# Patient Record
Sex: Male | Born: 1940 | Race: White | Hispanic: No | Marital: Married | Smoking: Former smoker
Health system: Southern US, Community
[De-identification: ages and names within clinical notes are randomized; demographics above are authoritative.]

## PROBLEM LIST (undated history)

## (undated) DIAGNOSIS — M359 Systemic involvement of connective tissue, unspecified: Secondary | ICD-10-CM

## (undated) DIAGNOSIS — K219 Gastro-esophageal reflux disease without esophagitis: Secondary | ICD-10-CM

## (undated) DIAGNOSIS — E2740 Unspecified adrenocortical insufficiency: Secondary | ICD-10-CM

## (undated) DIAGNOSIS — E039 Hypothyroidism, unspecified: Secondary | ICD-10-CM

## (undated) DIAGNOSIS — M4802 Spinal stenosis, cervical region: Secondary | ICD-10-CM

## (undated) DIAGNOSIS — M1990 Unspecified osteoarthritis, unspecified site: Secondary | ICD-10-CM

## (undated) DIAGNOSIS — W19XXXA Unspecified fall, initial encounter: Secondary | ICD-10-CM

## (undated) DIAGNOSIS — J449 Chronic obstructive pulmonary disease, unspecified: Secondary | ICD-10-CM

## (undated) DIAGNOSIS — C801 Malignant (primary) neoplasm, unspecified: Secondary | ICD-10-CM

## (undated) DIAGNOSIS — I10 Essential (primary) hypertension: Secondary | ICD-10-CM

## (undated) DIAGNOSIS — M069 Rheumatoid arthritis, unspecified: Secondary | ICD-10-CM

## (undated) DIAGNOSIS — I639 Cerebral infarction, unspecified: Secondary | ICD-10-CM

## (undated) DIAGNOSIS — E785 Hyperlipidemia, unspecified: Secondary | ICD-10-CM

## (undated) DIAGNOSIS — R296 Repeated falls: Secondary | ICD-10-CM

## (undated) HISTORY — PX: SKIN LESION EXCISION: SHX2412

## (undated) HISTORY — DX: Rheumatoid arthritis, unspecified: M06.9

## (undated) HISTORY — DX: Gastro-esophageal reflux disease without esophagitis: K21.9

## (undated) HISTORY — DX: Malignant (primary) neoplasm, unspecified: C80.1

## (undated) HISTORY — DX: Hypothyroidism, unspecified: E03.9

## (undated) HISTORY — DX: Hyperlipidemia, unspecified: E78.5

## (undated) HISTORY — DX: Cerebral infarction, unspecified: I63.9

## (undated) HISTORY — PX: CAROTID ENDARTERECTOMY: SUR193

## (undated) HISTORY — DX: Unspecified osteoarthritis, unspecified site: M19.90

## (undated) HISTORY — DX: Essential (primary) hypertension: I10

---

## 1989-01-27 DIAGNOSIS — C801 Malignant (primary) neoplasm, unspecified: Secondary | ICD-10-CM

## 1989-01-27 HISTORY — DX: Malignant (primary) neoplasm, unspecified: C80.1

## 1999-09-23 ENCOUNTER — Encounter: Payer: Self-pay | Admitting: Neurosurgery

## 1999-09-23 ENCOUNTER — Ambulatory Visit (HOSPITAL_COMMUNITY): Admission: RE | Admit: 1999-09-23 | Discharge: 1999-09-23 | Payer: Self-pay | Admitting: Neurosurgery

## 2003-10-28 ENCOUNTER — Ambulatory Visit: Payer: Self-pay | Admitting: Internal Medicine

## 2003-11-28 ENCOUNTER — Ambulatory Visit: Payer: Self-pay | Admitting: Internal Medicine

## 2004-01-12 ENCOUNTER — Ambulatory Visit: Payer: Self-pay | Admitting: Internal Medicine

## 2004-01-28 ENCOUNTER — Ambulatory Visit: Payer: Self-pay | Admitting: Internal Medicine

## 2004-03-05 ENCOUNTER — Ambulatory Visit: Payer: Self-pay | Admitting: Internal Medicine

## 2004-03-27 ENCOUNTER — Ambulatory Visit: Payer: Self-pay | Admitting: Internal Medicine

## 2004-04-29 ENCOUNTER — Ambulatory Visit: Payer: Self-pay | Admitting: Chiropractor

## 2004-05-07 ENCOUNTER — Ambulatory Visit: Payer: Self-pay | Admitting: Internal Medicine

## 2004-05-27 ENCOUNTER — Ambulatory Visit: Payer: Self-pay | Admitting: Internal Medicine

## 2004-07-03 ENCOUNTER — Ambulatory Visit: Payer: Self-pay | Admitting: Internal Medicine

## 2004-07-27 ENCOUNTER — Ambulatory Visit: Payer: Self-pay | Admitting: Internal Medicine

## 2004-09-03 ENCOUNTER — Ambulatory Visit: Payer: Self-pay | Admitting: Internal Medicine

## 2004-09-27 ENCOUNTER — Ambulatory Visit: Payer: Self-pay | Admitting: Internal Medicine

## 2004-10-27 ENCOUNTER — Ambulatory Visit: Payer: Self-pay | Admitting: Internal Medicine

## 2004-12-03 ENCOUNTER — Ambulatory Visit: Payer: Self-pay | Admitting: Internal Medicine

## 2004-12-27 ENCOUNTER — Ambulatory Visit: Payer: Self-pay | Admitting: Internal Medicine

## 2005-01-27 ENCOUNTER — Ambulatory Visit: Payer: Self-pay | Admitting: Internal Medicine

## 2005-01-31 ENCOUNTER — Ambulatory Visit: Payer: Self-pay | Admitting: Otolaryngology

## 2005-02-27 ENCOUNTER — Ambulatory Visit: Payer: Self-pay | Admitting: Internal Medicine

## 2005-04-01 ENCOUNTER — Ambulatory Visit: Payer: Self-pay | Admitting: Internal Medicine

## 2005-04-27 ENCOUNTER — Ambulatory Visit: Payer: Self-pay | Admitting: Internal Medicine

## 2005-05-27 ENCOUNTER — Ambulatory Visit: Payer: Self-pay | Admitting: Internal Medicine

## 2005-06-27 ENCOUNTER — Ambulatory Visit: Payer: Self-pay | Admitting: Internal Medicine

## 2005-08-26 ENCOUNTER — Ambulatory Visit: Payer: Self-pay | Admitting: Internal Medicine

## 2005-10-14 ENCOUNTER — Ambulatory Visit: Payer: Self-pay | Admitting: Internal Medicine

## 2005-10-27 ENCOUNTER — Ambulatory Visit: Payer: Self-pay | Admitting: Internal Medicine

## 2005-11-27 ENCOUNTER — Ambulatory Visit: Payer: Self-pay | Admitting: Internal Medicine

## 2005-12-27 ENCOUNTER — Ambulatory Visit: Payer: Self-pay | Admitting: Internal Medicine

## 2006-02-11 ENCOUNTER — Ambulatory Visit: Payer: Self-pay | Admitting: Internal Medicine

## 2006-02-27 ENCOUNTER — Ambulatory Visit: Payer: Self-pay | Admitting: Internal Medicine

## 2006-04-20 ENCOUNTER — Ambulatory Visit: Payer: Self-pay | Admitting: Internal Medicine

## 2006-04-28 ENCOUNTER — Ambulatory Visit: Payer: Self-pay | Admitting: Internal Medicine

## 2006-05-28 ENCOUNTER — Ambulatory Visit: Payer: Self-pay | Admitting: Internal Medicine

## 2006-06-28 ENCOUNTER — Ambulatory Visit: Payer: Self-pay | Admitting: Internal Medicine

## 2006-07-28 ENCOUNTER — Ambulatory Visit: Payer: Self-pay | Admitting: Internal Medicine

## 2006-09-11 ENCOUNTER — Ambulatory Visit: Payer: Self-pay | Admitting: Internal Medicine

## 2006-09-28 ENCOUNTER — Ambulatory Visit: Payer: Self-pay | Admitting: Internal Medicine

## 2006-10-28 ENCOUNTER — Ambulatory Visit: Payer: Self-pay | Admitting: Internal Medicine

## 2006-11-19 ENCOUNTER — Ambulatory Visit: Payer: Self-pay | Admitting: Internal Medicine

## 2006-11-28 ENCOUNTER — Ambulatory Visit: Payer: Self-pay | Admitting: Internal Medicine

## 2006-12-28 ENCOUNTER — Ambulatory Visit: Payer: Self-pay | Admitting: Internal Medicine

## 2007-01-28 ENCOUNTER — Ambulatory Visit: Payer: Self-pay | Admitting: Internal Medicine

## 2007-02-28 ENCOUNTER — Ambulatory Visit: Payer: Self-pay | Admitting: Internal Medicine

## 2007-04-08 ENCOUNTER — Ambulatory Visit: Payer: Self-pay | Admitting: Oncology

## 2007-04-28 ENCOUNTER — Ambulatory Visit: Payer: Self-pay | Admitting: Oncology

## 2007-05-28 ENCOUNTER — Ambulatory Visit: Payer: Self-pay | Admitting: Oncology

## 2007-06-08 ENCOUNTER — Ambulatory Visit: Payer: Self-pay | Admitting: Oncology

## 2007-06-16 ENCOUNTER — Ambulatory Visit: Payer: Self-pay | Admitting: Internal Medicine

## 2007-06-28 ENCOUNTER — Ambulatory Visit: Payer: Self-pay | Admitting: Oncology

## 2007-06-28 ENCOUNTER — Ambulatory Visit: Payer: Self-pay | Admitting: Unknown Physician Specialty

## 2007-07-28 ENCOUNTER — Ambulatory Visit: Payer: Self-pay | Admitting: Oncology

## 2007-08-28 ENCOUNTER — Ambulatory Visit: Payer: Self-pay | Admitting: Oncology

## 2007-09-28 ENCOUNTER — Ambulatory Visit: Payer: Self-pay | Admitting: Oncology

## 2007-10-12 ENCOUNTER — Ambulatory Visit: Payer: Self-pay | Admitting: Oncology

## 2007-10-28 ENCOUNTER — Ambulatory Visit: Payer: Self-pay | Admitting: Oncology

## 2007-12-28 ENCOUNTER — Ambulatory Visit: Payer: Self-pay | Admitting: Oncology

## 2008-01-14 ENCOUNTER — Ambulatory Visit: Payer: Self-pay | Admitting: Oncology

## 2008-01-28 ENCOUNTER — Ambulatory Visit: Payer: Self-pay | Admitting: Oncology

## 2008-04-27 ENCOUNTER — Ambulatory Visit: Payer: Self-pay | Admitting: Oncology

## 2008-05-05 ENCOUNTER — Ambulatory Visit: Payer: Self-pay | Admitting: Oncology

## 2008-05-27 ENCOUNTER — Ambulatory Visit: Payer: Self-pay | Admitting: Oncology

## 2008-07-27 ENCOUNTER — Ambulatory Visit: Payer: Self-pay | Admitting: Oncology

## 2008-08-04 ENCOUNTER — Ambulatory Visit: Payer: Self-pay | Admitting: Oncology

## 2008-08-27 ENCOUNTER — Ambulatory Visit: Payer: Self-pay | Admitting: Oncology

## 2008-10-27 ENCOUNTER — Ambulatory Visit: Payer: Self-pay | Admitting: Oncology

## 2008-11-03 ENCOUNTER — Ambulatory Visit: Payer: Self-pay | Admitting: Oncology

## 2008-11-27 ENCOUNTER — Ambulatory Visit: Payer: Self-pay | Admitting: Oncology

## 2008-12-27 ENCOUNTER — Ambulatory Visit: Payer: Self-pay | Admitting: Oncology

## 2009-01-27 ENCOUNTER — Ambulatory Visit: Payer: Self-pay | Admitting: Oncology

## 2009-02-27 ENCOUNTER — Ambulatory Visit: Payer: Self-pay | Admitting: Oncology

## 2009-03-16 ENCOUNTER — Ambulatory Visit: Payer: Self-pay | Admitting: Oncology

## 2009-03-25 ENCOUNTER — Ambulatory Visit: Payer: Self-pay | Admitting: Otolaryngology

## 2009-03-27 ENCOUNTER — Ambulatory Visit: Payer: Self-pay | Admitting: Oncology

## 2009-04-19 ENCOUNTER — Ambulatory Visit: Payer: Self-pay | Admitting: Family

## 2009-04-27 ENCOUNTER — Ambulatory Visit: Payer: Self-pay | Admitting: Oncology

## 2009-05-05 ENCOUNTER — Emergency Department: Payer: Self-pay | Admitting: Emergency Medicine

## 2009-05-11 ENCOUNTER — Encounter: Payer: Self-pay | Admitting: Otolaryngology

## 2009-05-27 ENCOUNTER — Ambulatory Visit: Payer: Self-pay | Admitting: Oncology

## 2009-06-08 ENCOUNTER — Ambulatory Visit: Payer: Self-pay | Admitting: Cardiovascular Disease

## 2009-06-08 DIAGNOSIS — R42 Dizziness and giddiness: Secondary | ICD-10-CM

## 2009-06-08 DIAGNOSIS — E785 Hyperlipidemia, unspecified: Secondary | ICD-10-CM

## 2009-06-10 DIAGNOSIS — I6529 Occlusion and stenosis of unspecified carotid artery: Secondary | ICD-10-CM

## 2009-06-12 ENCOUNTER — Encounter: Payer: Self-pay | Admitting: Cardiovascular Disease

## 2009-06-19 ENCOUNTER — Telehealth: Payer: Self-pay | Admitting: Cardiovascular Disease

## 2009-06-27 ENCOUNTER — Ambulatory Visit: Payer: Self-pay | Admitting: Oncology

## 2009-07-04 ENCOUNTER — Encounter: Admission: RE | Admit: 2009-07-04 | Discharge: 2009-07-04 | Payer: Self-pay | Admitting: Diagnostic Neuroimaging

## 2009-07-06 ENCOUNTER — Telehealth: Payer: Self-pay | Admitting: Cardiovascular Disease

## 2009-07-27 ENCOUNTER — Ambulatory Visit: Payer: Self-pay | Admitting: Internal Medicine

## 2009-08-08 ENCOUNTER — Encounter: Payer: Self-pay | Admitting: Cardiovascular Disease

## 2009-08-27 ENCOUNTER — Ambulatory Visit: Payer: Self-pay | Admitting: Oncology

## 2009-08-30 ENCOUNTER — Ambulatory Visit: Payer: Self-pay | Admitting: Cardiovascular Disease

## 2009-08-30 DIAGNOSIS — I739 Peripheral vascular disease, unspecified: Secondary | ICD-10-CM | POA: Insufficient documentation

## 2009-08-30 DIAGNOSIS — I10 Essential (primary) hypertension: Secondary | ICD-10-CM | POA: Insufficient documentation

## 2009-09-14 ENCOUNTER — Ambulatory Visit: Payer: Self-pay | Admitting: Oncology

## 2009-09-27 ENCOUNTER — Ambulatory Visit: Payer: Self-pay | Admitting: Oncology

## 2009-09-27 ENCOUNTER — Encounter: Payer: Self-pay | Admitting: Cardiovascular Disease

## 2009-09-27 ENCOUNTER — Ambulatory Visit: Payer: Self-pay

## 2009-10-03 ENCOUNTER — Ambulatory Visit: Payer: Self-pay | Admitting: Cardiovascular Disease

## 2009-10-10 ENCOUNTER — Telehealth: Payer: Self-pay | Admitting: Cardiovascular Disease

## 2009-10-15 ENCOUNTER — Ambulatory Visit: Payer: Self-pay | Admitting: Internal Medicine

## 2009-12-14 ENCOUNTER — Ambulatory Visit: Payer: Self-pay | Admitting: Oncology

## 2009-12-24 ENCOUNTER — Ambulatory Visit: Payer: Self-pay | Admitting: Internal Medicine

## 2009-12-27 ENCOUNTER — Ambulatory Visit: Payer: Self-pay | Admitting: Oncology

## 2010-02-17 ENCOUNTER — Encounter: Payer: Self-pay | Admitting: Diagnostic Neuroimaging

## 2010-02-21 ENCOUNTER — Telehealth: Payer: Self-pay | Admitting: Cardiovascular Disease

## 2010-02-26 NOTE — Letter (Signed)
Summary: PHI  PHI   Imported By: Harlon Flor 06/11/2009 16:47:19  _____________________________________________________________________  External Attachment:    Type:   Image     Comment:   External Document

## 2010-02-26 NOTE — Op Note (Signed)
Summary: Operative Report  Operative Report   Imported By: [place] Carbo 08/31/2009 11:17:57  _____________________________________________________________________  External Attachment:    Type:   Image     Comment:   External Document

## 2010-02-26 NOTE — Progress Notes (Signed)
Summary: FYI  Phone Note Call from Patient   Caller: Patient Call For: Gollan Summary of Call: Pt called in states he saw neurologist and they found out what is wrong with pt.  Tests ran and found pt to have narrowing of the arteries in the back of his neck.  Pt has been referred to [facility] to his former vascular surgeon Dr Alvia Grove. Pt very gracious for the care given to him at our office.   Initial call taken by: Cloyde Reams RN,  July 06, 2009 11:57 AM

## 2010-02-26 NOTE — Miscellaneous (Signed)
Summary: Orders Update  Clinical Lists Changes  Orders: Added new Referral order of Holter Monitor (Holter Monitor) - Signed

## 2010-02-26 NOTE — Assessment & Plan Note (Signed)
Summary: ROV   Visit Type:  Follow-up Primary Provider:  Ronna Polio, MD  CC:  c/o problems with BP and dizziness.Marland Kitchen  History of Present Illness: 70 year old Sanford, patient of Dr. Ronna Polio, with history of GERD, CVA, carotid arterial disease with a carotid endarterectomy on the left in 2003 with 60% disease on the right, hypothyroidism who presents for follow up evaluation of dizziness and labile blood pressures.  he reports that he is dizzy episodes are improved though his blood pressure continues to be labile. One minute it is elevated, the next it is very low with systolics in the 90s or low 100s, and then later it will be well controlled with pressures in the 130s to 140s systolic. He is unable to identify what makes it swing wildly. It is not a change in position, not a medication that he is taking. He denies any significant pain or stress that might push it upwards. He is well hydrated at all times.  He had orthostasis on his last office visit and his amlodipine was discontinued. Attempt to restart the amlodipine caused hypotension again and he stopped the medication again. He can tell when his blood pressure gets too low as he does not feel well. He does check his blood pressure quite frequently at home. he reports that the urine collection for pheochromocytoma was negative.   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 He also has had exposure to toxic chemicals. He does have a problem with dizziness and orthostasis he stands still though he has never had a tilt table test.  Preventive Screening-Counseling & Management  Alcohol-Tobacco     Smoking Status: never  Caffeine-Diet-Exercise     Does Patient Exercise: yes      Drug Use:  no.    Current Medications (verified): 1)  Simvastatin 20 Mg Tabs (Simvastatin) .Marland Kitchen.. 1 Tablet Once Daily 2)  Calcium + Vitamin D .... Take Once Daily 3)  Omega-3 1000 Mg Caps (Omega-3 Fatty Acids)  .... Take 1 By Mouth Once Daily 4)  Meclizine Hcl 25 Mg Tabs (Meclizine Hcl) .... Take 1 By Mouth As Needed 5)  Alprazolam 0.25 Mg Tabs (Alprazolam) .... Take 1 By Mouth As Needed 6)  Aspirin 81 Mg Tbec (Aspirin) .... Take Two Tablets By Mouth Daily 7)  Levoxyl 112 Mcg Tabs (Levothyroxine Sodium) .Marland Kitchen.. 1 Tablet Once Daily 8)  Hydroxyurea 500 Mg Caps (Hydroxyurea) .... Take As Directed 9)  Prilosec 20 Mg Cpdr (Omeprazole) .Marland Kitchen.. 1 Tablet Once Daily  Allergies (verified): No Known Drug Allergies  Past History:  Family History: Last updated: 08/30/2009 Family History of Coronary Artery Disease: Father MI age 53  Social History: Last updated: 08/30/2009 Retired  Married  Tobacco Use - No.  Alcohol Use - no Regular Exercise - yes Drug Use - no  Risk Factors: Exercise: yes (08/30/2009)  Risk Factors: Smoking Status: never (08/30/2009)  Past Medical History: Hyperlipidemia Hypertension CAD CVA GERD hypothyroidism  Past Surgical History: Carotid Endarterectomy  Family History: Family History of Coronary Artery Disease: Father MI age 29  Social History: Retired  Married  Tobacco Use - No.  Alcohol Use - no Regular Exercise - yes Drug Use - no Smoking Status:  never Does Patient Exercise:  yes Drug Use:  no  Review of Systems  The patient denies fever, weight loss, weight gain, vision loss, decreased hearing, hoarseness, chest pain, syncope, dyspnea on exertion, peripheral edema, prolonged cough, abdominal pain, incontinence, muscle weakness,  depression, and enlarged lymph nodes.         labile blood presure, dizzy with low BP  Vital Signs:  Patient profile:   70 year old male Height:      66 inches Weight:      204 pounds BMI:     33.05 Pulse rate:   83 / minute BP sitting:   138 / 94  (left arm)  Vitals Entered By: Bishop Dublin, CMA (August 30, 2009 4:10 PM)  Nutrition Counseling: Patient's BMI is greater than 25 and therefore counseled on weight  management options.  Physical Exam  General:  Well developed, well nourished, in no acute distress. Head:  normocephalic and atraumatic Neck:  Neck supple, no JVD. No masses, thyromegaly or abnormal cervical nodes. Lungs:  Moderately decresaed BS throughout Heart:  Non-displaced PMI, chest non-tender; regular rate and rhythm, S1, S2 with II/VI SEM RSB,  no rubs or gallops. Carotid upstroke normal, no bruit. Normal abdominal aortic size, no bruits. Femorals normal pulses, no bruits. Pedals normal pulses. No edema, no varicosities. Abdomen:  Bowel sounds positive; abdomen soft and non-tender without masses Msk:  Back normal, normal gait. Muscle strength and tone normal. Pulses:  pulses normal in all 4 extremities Extremities:  No clubbing or cyanosis. Neurologic:  Alert and oriented x 3. Skin:  Intact without lesions or rashes. Psych:  Normal affect.   Impression & Recommendations:  Problem # 1:  HYPERTENSION, LABILE (ICD-401.9) etiology of his labile blood pressure is still uncertain. We have ordered a renal artery ultrasound. He has had an MRI, catheterization to evaluate the carotid disease and vessels in his head and neck, urine test. His symptoms do not seem to indicate an underlying cardiac etiology. His Holter test was essentially negative over 48 hours. I suspect that he may have either renal artery stenosis or some underlying neurocardiogenic etiology to his labile pressure. I have suggested that he could use salt loading for his low pressure, TED hose if he feels symptomatic. For his high pressure, he could take hydralazine 25 mg p.o. p.r.n. with a repeat dose if necessary for hypertension with systolic pressure greater than 170.  It is difficult to get a sense of how symptomatic he is. He is not symptomatic with his elevated pressures though does check his blood pressure quite frequently. It does sound that he has some symptoms of dizziness when he is hypotensive and I have  suggested that he lie on his bed rather than sit in his recliner and expect his symptoms to resolve.  His updated medication list for this problem includes:    Aspirin 81 Mg Tbec (Aspirin) .Marland Kitchen... Take two tablets by mouth daily  Orders: Renal Artery Duplex (Renal Artery Duplex)  Problem # 2:  HYPERLIPIDEMIA-MIXED (ICD-272.4) Given his underlying carotid arterial disease, we have recommended to him that he have a total cholesterol less than 150, LDL less than 70  His updated medication list for this problem includes:    Simvastatin 20 Mg Tabs (Simvastatin) .Marland Kitchen... 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  His updated medication list for this problem includes:    Aspirin 81 Mg Tbec (Aspirin) .Marland Kitchen... Take two tablets by mouth daily  Patient Instructions: 1)  Your physician recommends that you continue on your current medications as directed. Please refer to the Current Medication list given to you  today. 2)  Your physician wants you to follow-up in:   6 months You will receive a reminder letter in the mail two months in advance. If you don't receive a letter, please call our office to schedule the follow-up appointment. 3)  Your physician has requested that you have a renal artery duplex. During this test, an ultrasound is used to evaluate blood flow to the kidneys. Allow one hour for this exam. Do not eat after midnight the day before and avoid carbonated beverages. Take your medications as you usually do.

## 2010-02-26 NOTE — Progress Notes (Signed)
Summary: RESULTS  Phone Note Call from Patient Call back at Home Phone [phone]   Caller: SELF Call For: [facility] Summary of Call: WOULD LIKE HOLTER RESULTS Initial call taken by: Harlon Flor,  Jun 19, 2009 2:30 PM  Follow-up for Phone Call        Called spoke with pt's wife advised Holter monitor results are back awaiting Dr Windell Hummingbird review.  Will call after results are interpreted by Dr Mariah Milling.   Follow-up by: Cloyde Reams RN,  Jun 19, 2009 3:09 PM    Additional Follow-up for Phone Call Additional follow up Details #2::    Looks normal   Appended Document: RESULTS Spoke with pt aware of results.  EWJ

## 2010-02-26 NOTE — Progress Notes (Signed)
Summary: CALL BACK  Phone Note Call from Patient Call back at Home Phone [phone]   Caller: SELF Call For: [facility] Summary of Call: WOULD LIKE A CALL BACK ABOUT SCHEDULING THE CT SCAN Initial call taken by: Harlon Flor,  October 10, 2009 11:39 AM  Follow-up for Phone Call        Dr. Tilman Neat office has never gotten back with pt about scheduling CT scan. Can we order the CT for this pt. Please advise. Benedict Needy, RN  October 10, 2009 4:10 PM   Additional Follow-up for Phone Call Additional follow up Details #1::        we can order the CT scan. Would let Dr. walkers office know that we are going to do it so they know...hope it is ok with them

## 2010-02-26 NOTE — Assessment & Plan Note (Signed)
Summary: NP6/AMD   Primary Provider:  Ronna Polio, MD  CC:  Consult; Dizziness.  History of Present Illness: 70 year old gentleman, patient of Dr. Ronna Polio, with history of GERD, CVA, carotid arterial disease with a carotid endarterectomy on the left in 2003 with 75% disease on the right, hypothyroidism who presents for workup of dizziness.  [name] states that he has frequent episodes of dizziness starting back in January. They typically last from one to 3 minutes. They occur when he gets up from a chair, when he is over heated and working outside in his yard. He states his symptoms have been a little but better when he has been drinking Gatorade. Once he has been standing, he typically does not have further episodes of dizziness.  Blood pressures in the office show some orthostasis with blood pressure of 112/74, pulse of 66 lying down, blood pressure decreasing to 99/66 with pulse of 80 with standing.  Current Medications (verified): 1)  Simvastatin (Unknown Dose) Tabs (Simvastatin) .... Take One Tablet By Mouth Daily At Bedtime 2)  Amlodipine Besylate 2.5 Mg Tabs (Amlodipine Besylate) .... Take 1 By Mouth Once Daily 3)  Calcium + Vitamin D .... Take Once Daily 4)  Omega-3 1000 Mg Caps (Omega-3 Fatty Acids) .... Take 1 By Mouth Once Daily 5)  Meclizine Hcl 25 Mg Tabs (Meclizine Hcl) .... Take 1 By Mouth As Needed 6)  Alprazolam 0.25 Mg Tabs (Alprazolam) .... Take 1 By Mouth As Needed 7)  Aspirin 81 Mg Tbec (Aspirin) .... Take One Tablet By Mouth Daily 8)  Levoxyl 100 Mcg Tabs (Levothyroxine Sodium) .... Take 1 By Mouth Once Daily 9)  Hydroxyurea 500 Mg Caps (Hydroxyurea) .... Take As Directed  Allergies (verified): No Known Drug Allergies  Review of Systems  The patient denies fever, weight loss, weight gain, vision loss, decreased hearing, hoarseness, chest pain, syncope, dyspnea on exertion, peripheral edema, prolonged cough, abdominal pain, incontinence, muscle  weakness, depression, and enlarged lymph nodes.    Vital Signs:  Patient profile:   70 year old male Height:      66 inches Weight:      200.75 pounds BMI:     32.52 Pulse rate:   69 / minute BP sitting:   94 / 62  (left arm) Cuff size:   regular  Vitals Entered By: Stanton Kidney, EMT-P (Jun 08, 2009 3:21 PM)  Serial Vital Signs/Assessments:  Time      Position  BP       Pulse  Resp  Temp     By 3:39 PM             112/74   66                    Stanton Kidney, EMT-P 3:41 PM             101/68   76                    Stanton Kidney, EMT-P 3:42 PM             105/68   83                    Stanton Kidney, EMT-P 3:44 PM             100/66   82                    Stanton Kidney, EMT-P 3:47  99/66    80                    Stanton Kidney, EMT-P  Comments: 3:39 PM Laying (L) arm -no symptoms By: Stanton Kidney, EMT-P  3:41 PM Sitting (L) arm- no symptoms By: Stanton Kidney, EMT-P  3:42 PM 0 min Standing (L) arm By: Stanton Kidney, EMT-P  3:44 PM 2 min Standing (L) arm- no symptoms By: Stanton Kidney, EMT-P  3:47 5 min Standing (L) arm- No symptoms By: Stanton Kidney, EMT-P    Physical Exam  General:  well-appearing middle-aged gentleman in no apparent distress, HEENT exam is benign, oropharynx is clear, neck supple with no JVP, 2+ carotid bruit on the right, heart sounds are regular with S1-S2 and no murmurs appreciated, lungs are clear to auscultation with no wheezes Rales, abdominal exam is benign, no significant lower extremity edema, neurologic exam is grossly nonfocal, skin is warm and dry. Pulses are equal and symmetrical in his upper and lower extremities.    EKG  Procedure date:  06/08/2009  Findings:      normal sinus rhythm with a rate of 69 beats per minute, no significant ST or T wave changes  Impression & Recommendations:  Problem # 1:  DIZZINESS (ICD-780.4) etiology of his dizziness is uncertain. He does have some orthostasis in the office and his symptoms do sound  somewhat orthostatic in nature. We have suggested that he hold his amlodipine and monitor his blood pressure at home in his symptoms.  We have ordered an event monitor for him to wear through the weekend and he will turn this and as soon as time is complete. We will call him with the results.  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 His lipid control will be very important for him. His goal LDL is less than 70. He is on simvastatin but he does not know the dose. I've encouraged him to increase his aspirin to 81 mg x2  His updated medication list for this problem includes:    Aspirin 81 Mg Tbec (Aspirin) .Marland Kitchen... Take two tablets by mouth daily  Patient Instructions: 1)  Your physician has recommended you make the following change in your medication: Start taking 81mg  Aspirin 2 tablets daily and hold your Amlodipine.  2)  Your physician has recommended that you wear a holter monitor.  Holter monitors are medical devices that record the heart's electrical activity. Doctors most often use these monitors to diagnose arrhythmias. Arrhythmias are problems with the speed or rhythm of the heartbeat. The monitor is a small, portable device. You can wear one while you do your normal daily activities. This is usually used to diagnose what is causing palpitations/syncope (passing out).

## 2010-02-26 NOTE — Letter (Signed)
Summary: [facility]   Imported By: Harlon Flor 06/11/2009 07:43:51  _____________________________________________________________________  External Attachment:    Type:   Image     Comment:   External Document

## 2010-02-26 NOTE — Assessment & Plan Note (Signed)
Summary: ROV/AMD   Visit Type:  Follow-up Primary Provider:  Ronna Polio, MD  CC:  c/o blood pressure problems.  Denies chest pain or shortness of breath.  .  History of Present Illness: 70 year old gentleman, patient of Dr. Ronna Polio, with history of GERD, CVA, carotid arterial disease with a carotid endarterectomy on the left in 2003 with 60% disease on the right, hypothyroidism who presents for follow up. in the past, he has had dizziness and symptoms of feeling like he is going to pass out on his blood pressure medication. He has very labile blood pressures.  He reports that he has been doing well recently. His blood pressure is still labile though he is not taking any blood pressure medications. On average, he has systolic pressures in the 130s. Rarely it goes to 160. He does not have any dizziness or symptoms like he feels he is going to pass out.  He is relatively active. Otherwise has no new complaints.  He did have a recent renal ultrasound that showed no renal artery stenosis, kidneys are normal size and symmetrical. Of note he does have some masses ranging in size on the right side. CT or MRI was recommended. He has no symptoms of discomfort on the side.  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 He also has had exposure to toxic chemicals. He does have a remote hx of dizziness and orthostasis if he stands still though he has never had a tilt table test.  Current Medications (verified): 1)  Simvastatin 20 Mg Tabs (Simvastatin) .Marland Kitchen.. 1 Tablet Once Daily 2)  Calcium + Vitamin D .... Take Once Daily 3)  Omega-3 1000 Mg Caps (Omega-3 Fatty Acids) .... Take 1 By Mouth Once Daily 4)  Alprazolam 0.25 Mg Tabs (Alprazolam) .... Take 1 By Mouth As Needed 5)  Aspirin 81 Mg Tbec (Aspirin) .... Take Two Tablets By Mouth Daily 6)  Levoxyl 112 Mcg Tabs (Levothyroxine Sodium) .Marland Kitchen.. 1 Tablet Once Daily 7)  Hydroxyurea 500 Mg Caps (Hydroxyurea)  .... Take As Directed 8)  Prilosec 20 Mg Cpdr (Omeprazole) .Marland Kitchen.. 1 Tablet Once Daily  Allergies (verified): No Known Drug Allergies  Past History:  Past Medical History: Last updated: 08/30/2009 Hyperlipidemia Hypertension CAD CVA GERD hypothyroidism  Past Surgical History: Last updated: 08/30/2009 Carotid Endarterectomy  Family History: Last updated: 08/30/2009 Family History of Coronary Artery Disease: Father MI age 74  Social History: Last updated: 08/30/2009 Retired  Married  Tobacco Use - No.  Alcohol Use - no Regular Exercise - yes Drug Use - no  Risk Factors: Exercise: yes (08/30/2009)  Risk Factors: Smoking Status: never (08/30/2009)  Review of Systems  The patient denies fever, weight loss, weight gain, vision loss, decreased hearing, hoarseness, chest pain, syncope, dyspnea on exertion, peripheral edema, prolonged cough, abdominal pain, incontinence, muscle weakness, depression, and enlarged lymph nodes.    Vital Signs:  Patient profile:   70 year old male Height:      66 inches Weight:      204 pounds BMI:     33.05 Pulse rate:   84 / minute BP sitting:   128 / 92  (left arm) Cuff size:   regular  Vitals Entered By: Bishop Dublin, CMA (October 03, 2009 11:49 AM)  Physical Exam  General:  Well developed, well nourished, in no acute distress. Head:  normocephalic and atraumatic Neck:  Neck supple, no JVD. No masses, thyromegaly or abnormal cervical nodes. Lungs:  Moderately decresaed BS throughout Heart:  Non-displaced PMI, chest non-tender; regular rate and rhythm, S1, S2 with II/VI SEM RSB,  no rubs or gallops. Carotid upstroke normal, no bruit. Normal abdominal aortic size, no bruits. Femorals normal pulses, no bruits. Pedals normal pulses. Trace edema, no varicosities. Abdomen:   abdomen soft and non-tender without masses Msk:  Back normal, normal gait. Muscle strength and tone normal. Pulses:  pulses normal in all 4  extremities Extremities:  No clubbing or cyanosis. Neurologic:  Alert and oriented x 3. Skin:  Intact without lesions or rashes. Psych:  Normal affect.   Impression & Recommendations:  Problem # 1:  HYPERTENSION, LABILE (ICD-401.9) we have recommended that he not take any blood pressure medications at this time on a regular basis. He can take hydralazine on a p.r.n. basis for systolic pressures greater than 170. He has very labile blood pressures of uncertain etiology. His workup has been extensive. He has had radiation exposure to the head and neck and this may be contributing.  His updated medication list for this problem includes:    Aspirin 81 Mg Tbec (Aspirin) .Marland Kitchen... Take two tablets by mouth daily  Problem # 2:  PVD (ICD-443.9) He does have at least moderate carotid arterial disease, history of carotid endarterectomy on the left in 2003. We'll continue aggressive lipid management.  Problem # 3:  HYPERLIPIDEMIA-MIXED (ICD-272.4) We do not have a copy of his most recent lipid panel we'll try to obtain a values for our records.  His updated medication list for this problem includes:    Simvastatin 20 Mg Tabs (Simvastatin) .Marland Kitchen... 1 tablet once daily  Problem # 4:  DIZZINESS (ICD-780.4) he has had no further episodes of dizziness off all blood pressure medications. We'll continue to hold his medications as he is very sensitive.

## 2010-02-26 NOTE — Procedures (Signed)
Summary: Holter and Event  Holter and Event   Imported By: Harlon Flor 06/22/2009 11:23:19  _____________________________________________________________________  External Attachment:    Type:   Image     Comment:   External Document

## 2010-03-06 NOTE — Progress Notes (Signed)
Summary: BP Fluctuating  Phone Note Call from Patient Call back at Home Phone [phone]   Caller: Self Call For: Gollan Summary of Call: BP is fluctuating.  Either too high or too low.  Pt is taking steriods from [facility].  73/68 to 140/101.   Initial call taken by: Harlon Flor,  February 21, 2010 3:54 PM  Follow-up for Phone Call        I am not too concerned with his high BP. The low BP can be concerning. He has had labile BP before, thought secondary to previous radiation to head and neck. Not much to do. When low, needs to stay hydrated, salt load, TED hose. When high, wouldf recheck 30 min later, could take hydralazine if needed though would be very careful as this could bottom him out.      Appended Document: BP Fluctuating Spoke to pt today, he states BP is staying low most of the time, the only time it goes up is after taking a salt tablet. Notified pt we are not concerned with the elevated numbers but are concerned about the low BP's. Pt has f/u at [facility] 03/27/10. He has been consistently increasing fluid intake and wears TED Hose daily. Pt will incr salt load, and advised pt if systolic BP running <90 to take salt tablet (pt already had these prescribed). Pt ok with this and he will contact our office if after trying this as well and BP remains severely low.  Appended Document: BP Fluctuating Pt was also started on Florinef 0.1 mg 1/2 tablet daily.   Clinical Lists Changes  Medications: Added new medication of FLUDROCORTISONE ACETATE 0.1 MG TABS (FLUDROCORTISONE ACETATE) 1/2 tablet once daily. - Signed

## 2010-03-15 ENCOUNTER — Ambulatory Visit: Payer: Self-pay | Admitting: Oncology

## 2010-03-28 ENCOUNTER — Ambulatory Visit: Payer: Self-pay | Admitting: Oncology

## 2010-06-14 ENCOUNTER — Ambulatory Visit: Payer: Self-pay | Admitting: Oncology

## 2010-06-28 ENCOUNTER — Ambulatory Visit: Payer: Self-pay | Admitting: Oncology

## 2010-07-06 ENCOUNTER — Emergency Department: Payer: Self-pay | Admitting: Internal Medicine

## 2010-08-12 ENCOUNTER — Encounter: Payer: Self-pay | Admitting: Cardiovascular Disease

## 2010-09-20 ENCOUNTER — Ambulatory Visit: Payer: Self-pay | Admitting: Oncology

## 2010-09-28 ENCOUNTER — Ambulatory Visit: Payer: Self-pay | Admitting: Oncology

## 2010-10-01 ENCOUNTER — Encounter: Payer: Self-pay | Admitting: Cardiovascular Disease

## 2010-10-02 ENCOUNTER — Ambulatory Visit: Payer: Self-pay | Admitting: Cardiovascular Disease

## 2010-10-04 ENCOUNTER — Other Ambulatory Visit: Payer: Self-pay | Admitting: *Deleted

## 2010-10-04 MED ORDER — FLUTICASONE PROPIONATE 50 MCG/ACT NA SUSP: 1.0000 | Freq: Every day | NASAL | Status: DC

## 2010-10-08 ENCOUNTER — Ambulatory Visit (INDEPENDENT_AMBULATORY_CARE_PROVIDER_SITE_OTHER): Payer: Medicare Other | Admitting: Cardiovascular Disease

## 2010-10-08 ENCOUNTER — Encounter: Payer: Self-pay | Admitting: Cardiovascular Disease

## 2010-10-08 DIAGNOSIS — E785 Hyperlipidemia, unspecified: Secondary | ICD-10-CM

## 2010-10-08 DIAGNOSIS — I6529 Occlusion and stenosis of unspecified carotid artery: Secondary | ICD-10-CM

## 2010-10-08 DIAGNOSIS — I10 Essential (primary) hypertension: Secondary | ICD-10-CM

## 2010-10-08 DIAGNOSIS — I739 Peripheral vascular disease, unspecified: Secondary | ICD-10-CM

## 2010-10-08 DIAGNOSIS — R42 Dizziness and giddiness: Secondary | ICD-10-CM

## 2010-10-08 NOTE — Progress Notes (Signed)
Patient ID: Johnny Sanford, male    DOB: [birth year], 70 y.o.   MRN: [id number]  HPI Comments: 70 year old gentleman, patient of Dr. Ronna Polio, with history of GERD, CVA, carotid arterial disease with a carotid endarterectomy on the left in 2003 with 60% disease on the right, hypothyroidism, very labile blood pressures, Started on Florinef recently by a [facility] physician, who presents for routine followup   He reports that he has been doing well recently. His blood pressure is still labile though he Believes it has smooth out on the Florinef.  On average, he has systolic pressures in the 130s. Rarely it goes to 160. He does not have any dizziness or symptoms like he feels he is going to pass out. He is relatively active. Otherwise has no new complaints.   He did have a recent renal ultrasound that showed no renal artery stenosis, kidneys are normal size and symmetrical. Of note he does have some masses ranging in size on the right side.   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 He also has had exposure to toxic chemicals. He does have a remote hx of dizziness and orthostasis if he stands still though he has never had a tilt table test.  EKG shows normal sinus rhythm with rate 72 beats per minute with no significant ST or T wave changes     Outpatient Encounter Prescriptions as of 10/08/2010  Medication Sig Dispense Refill  . ALPRAZolam (XANAX) 0.25 MG tablet Take 0.25 mg by mouth daily as needed.        Marland Kitchen aspirin 81 MG EC tablet Take 162 mg by mouth daily.        . Calcium-Vitamin D 600-200 MG-UNIT per tablet Take 1 tablet by mouth daily. Unsure of dosage       . FLUDROCORTISONE ACETATE PO Take 0.5 tablets by mouth daily. 0.1 mg       . fluticasone (FLONASE) 50 MCG/ACT nasal spray Place 1 spray into the nose daily.  16 g  2  . hydroxyurea (HYDREA) 500 MG capsule Take 500 mg by mouth as directed. May take with food to minimize GI side effects.         Marland Kitchen levothyroxine (SYNTHROID, LEVOTHROID) 137 MCG tablet Take 137 mcg by mouth daily.        . OMEGA-3 1000 MG CAPS Take 1 capsule by mouth daily.        Marland Kitchen omeprazole (PRILOSEC) 20 MG capsule Take 20 mg by mouth daily.        . simvastatin (ZOCOR) 20 MG tablet Take 20 mg by mouth daily.        Marland Kitchen DISCONTD: levothyroxine (SYNTHROID, LEVOTHROID) 112 MCG tablet Take 112 mcg by mouth daily.          Review of Systems  Constitutional: Negative.   HENT: Negative.   Eyes: Negative.   Respiratory: Negative.   Cardiovascular: Negative.   Gastrointestinal: Negative.   Musculoskeletal: Negative.   Skin: Negative.   Neurological: Negative.   Hematological: Negative.   Psychiatric/Behavioral: Negative.   All other systems reviewed and are negative.    BP 135/85  Pulse 62  Ht 5\' 6"  (1.676 m)  Wt 195 lb (88.451 kg)  BMI 31.47 kg/m2  Physical Exam  Nursing note and vitals reviewed. Constitutional: He is oriented to person, place, and time. He appears well-developed and well-nourished.  HENT:  Head: Normocephalic.  Nose: Nose normal.  Mouth/Throat: Oropharynx is clear  and moist.  Eyes: Conjunctivae are normal. Pupils are equal, round, and reactive to light.  Neck: Normal range of motion. Neck supple. No JVD present.  Cardiovascular: Normal rate, regular rhythm, S1 normal, S2 normal and intact distal pulses.  Exam reveals no gallop and no friction rub.   Murmur heard.  Crescendo systolic murmur is present with a grade of 1/6  Pulmonary/Chest: Effort normal and breath sounds normal. No respiratory distress. He has no wheezes. He has no rales. He exhibits no tenderness.  Abdominal: Soft. Bowel sounds are normal. He exhibits no distension. There is no tenderness.  Musculoskeletal: Normal range of motion. He exhibits no edema and no tenderness.  Lymphadenopathy:    He has no cervical adenopathy.  Neurological: He is alert and oriented to person, place, and time. Coordination normal.  Skin:  Skin is warm and dry. No rash noted. No erythema.  Psychiatric: He has a normal mood and affect. His behavior is normal. Judgment and thought content normal.           Assessment and Plan

## 2010-10-08 NOTE — Assessment & Plan Note (Signed)
Labile blood pressures with no recent lightheadedness or dizziness. He is on Florinef 0.5 mg daily.

## 2010-10-08 NOTE — Patient Instructions (Signed)
You are doing well. No medication changes were made. Please call us if you have new issues that need to be addressed before your next appt.  We will call you for a follow up Appt. In 6 months  

## 2010-10-08 NOTE — Assessment & Plan Note (Signed)
He reports having routine carotid ultrasound at [facility], recent catheterization to evaluate his carotid stenosis estimated at 60%. We'll continue aggressive lipid management.

## 2010-10-08 NOTE — Assessment & Plan Note (Signed)
We will watch for his lipid panel when he next sees Dr. Dan Humphreys. Goal LDL should be less than 70 given his severe underlying carotid arterial disease.

## 2010-10-17 ENCOUNTER — Ambulatory Visit (INDEPENDENT_AMBULATORY_CARE_PROVIDER_SITE_OTHER): Payer: Medicare Other | Admitting: Internal Medicine

## 2010-10-17 ENCOUNTER — Encounter: Payer: Self-pay | Admitting: Internal Medicine

## 2010-10-17 DIAGNOSIS — I10 Essential (primary) hypertension: Secondary | ICD-10-CM

## 2010-10-17 DIAGNOSIS — E785 Hyperlipidemia, unspecified: Secondary | ICD-10-CM

## 2010-10-17 DIAGNOSIS — R42 Dizziness and giddiness: Secondary | ICD-10-CM

## 2010-10-17 NOTE — Progress Notes (Signed)
Subjective:    Patient ID: Johnny Sanford, male    DOB: 07-22-1940, 70 y.o.   MRN: [id number]  HPI Mr. Tidd is a 70 year old male with a history of hypertension and dizziness who presents for followup. He reports significant improvement in his symptoms of dizziness and episodes of low blood pressure. In the past his blood pressures been quite labile resulting in some episodes of symptomatic low blood pressure. He has recently started exercising by walking up to 3 miles daily and reports near resolution of his fluctuation in blood pressure. He denies any chest pain, shortness of breath, palpitations, claudication, or other problems. He reports he is feeling extremely well and is planning to increase his mileage.  Outpatient Encounter Prescriptions as of 10/17/2010  Medication Sig Dispense Refill  . ALPRAZolam (XANAX) 0.25 MG tablet Take 0.5 mg by mouth daily as needed.       Marland Kitchen aspirin 81 MG EC tablet Take 162 mg by mouth daily.       . Calcium Carb-Cholecalciferol (CALCIUM 1000 + D PO) Take 1 tablet by mouth daily.        Marland Kitchen FLUDROCORTISONE ACETATE PO Take 0.5 tablets by mouth daily. 0.1 mg       . fluticasone (FLONASE) 50 MCG/ACT nasal spray Place 1 spray into the nose daily.  16 g  2  . hydroxyurea (HYDREA) 500 MG capsule Take 500 mg by mouth as directed. May take with food to minimize GI side effects.       Marland Kitchen levothyroxine (SYNTHROID, LEVOTHROID) 137 MCG tablet Take 137 mcg by mouth daily.        . OMEGA-3 1000 MG CAPS Take 1 capsule by mouth daily.        Marland Kitchen omeprazole (PRILOSEC) 20 MG capsule Take 20 mg by mouth daily.        . simvastatin (ZOCOR) 20 MG tablet Take 20 mg by mouth daily.          Review of Systems  Constitutional: Negative for fever, chills, activity change, appetite change, fatigue and unexpected weight change.  Eyes: Negative for visual disturbance.  Respiratory: Negative for cough and shortness of breath.   Cardiovascular: Negative for chest pain, palpitations and leg  swelling.  Gastrointestinal: Negative for abdominal pain and abdominal distention.  Genitourinary: Negative for dysuria, urgency and difficulty urinating.  Musculoskeletal: Negative for arthralgias and gait problem.  Skin: Negative for color change and rash.  Hematological: Negative for adenopathy.  Psychiatric/Behavioral: Negative for sleep disturbance and dysphoric mood. The patient is not nervous/anxious.    BP 135/89  Pulse 85  Temp(Src) 97.9 F (36.6 C) (Oral)  Resp 16  Ht 5' 6.5" (1.689 m)  Wt 194 lb 4 oz (88.111 kg)  BMI 30.88 kg/m2  SpO2 96%     Objective:   Physical Exam  Constitutional: He is oriented to person, place, and time. He appears well-developed and well-nourished. No distress.  HENT:  Head: Normocephalic and atraumatic.  Right Ear: External ear normal.  Left Ear: External ear normal.  Nose: Nose normal.  Mouth/Throat: Oropharynx is clear and moist. No oropharyngeal exudate.  Eyes: Conjunctivae and EOM are normal. Pupils are equal, round, and reactive to light. Right eye exhibits no discharge. Left eye exhibits no discharge. No scleral icterus.  Neck: Normal range of motion. Neck supple. No tracheal deviation present. No thyromegaly present.  Cardiovascular: Normal rate, regular rhythm and normal heart sounds.  Exam reveals no gallop and no friction rub.   No murmur heard.  Pulmonary/Chest: Effort normal and breath sounds normal. No respiratory distress. He has no wheezes. He has no rales. He exhibits no tenderness.  Musculoskeletal: Normal range of motion. He exhibits no edema.  Lymphadenopathy:    He has no cervical adenopathy.  Neurological: He is alert and oriented to person, place, and time. No cranial nerve deficit. Coordination normal.  Skin: Skin is warm and dry. No rash noted. He is not diaphoretic. No erythema. No pallor.  Psychiatric: He has a normal mood and affect. His behavior is normal. Judgment and thought content normal.            Assessment & Plan:  1. Hypertension -patient with labile hypertension in the past. Doing much better after starting a regular exercise program of walking on a daily basis. Encouraged him to continue with this. We will request labs from our previous office including recent renal function. He will followup in 3-6 months.  2. Dizziness - dizziness is nearly resolved since starting his daily walking program. Encouraged him to continue with this. He will call should symptoms recur.  3. Hyperlipidemia - will request records on most recent lipid profile from her previous office. He will continue with simvastatin.

## 2010-12-17 ENCOUNTER — Encounter: Payer: Self-pay | Admitting: Internal Medicine

## 2010-12-31 ENCOUNTER — Encounter: Payer: Self-pay | Admitting: Internal Medicine

## 2011-01-03 ENCOUNTER — Other Ambulatory Visit: Payer: Self-pay | Admitting: *Deleted

## 2011-01-03 MED ORDER — LEVOTHYROXINE SODIUM 137 MCG PO TABS: 137.0000 ug | ORAL_TABLET | Freq: Every day | ORAL | Status: DC

## 2011-01-16 ENCOUNTER — Encounter: Payer: Self-pay | Admitting: Internal Medicine

## 2011-01-16 ENCOUNTER — Ambulatory Visit (INDEPENDENT_AMBULATORY_CARE_PROVIDER_SITE_OTHER): Payer: Medicare Other | Admitting: Internal Medicine

## 2011-01-16 ENCOUNTER — Ambulatory Visit: Payer: Medicare Other | Admitting: Internal Medicine

## 2011-01-16 VITALS — BP 124/86 | HR 87 | Temp 97.9°F | Resp 18 | Ht 66.0 in | Wt 190.0 lb

## 2011-01-16 DIAGNOSIS — J40 Bronchitis, not specified as acute or chronic: Secondary | ICD-10-CM

## 2011-01-16 DIAGNOSIS — E039 Hypothyroidism, unspecified: Secondary | ICD-10-CM

## 2011-01-16 DIAGNOSIS — M25512 Pain in left shoulder: Secondary | ICD-10-CM

## 2011-01-16 DIAGNOSIS — E785 Hyperlipidemia, unspecified: Secondary | ICD-10-CM

## 2011-01-16 DIAGNOSIS — G4700 Insomnia, unspecified: Secondary | ICD-10-CM

## 2011-01-16 DIAGNOSIS — M25519 Pain in unspecified shoulder: Secondary | ICD-10-CM

## 2011-01-16 LAB — COMPREHENSIVE METABOLIC PANEL
ALT: 14 U/L (ref 0–53)
CO2: 32 mEq/L (ref 19–32)
Calcium: 8.6 mg/dL (ref 8.4–10.5)
Chloride: 93 mEq/L — ABNORMAL LOW (ref 96–112)
GFR: 109.59 mL/min (ref >60.00)
Sodium: 133 mEq/L — ABNORMAL LOW (ref 135–145)
Total Protein: 7.3 g/dL (ref 6.0–8.3)

## 2011-01-16 LAB — LIPID PANEL: Total CHOL/HDL Ratio: 3

## 2011-01-16 MED ORDER — DOXYCYCLINE HYCLATE 100 MG PO CAPS: 100.0000 mg | ORAL_CAPSULE | Freq: Two times a day (BID) | ORAL | Status: AC

## 2011-01-16 MED ORDER — TRAZODONE HCL 50 MG PO TABS: 25.0000 mg | ORAL_TABLET | Freq: Every evening | ORAL | Status: DC | PRN

## 2011-01-16 MED ORDER — HYDROCODONE-ACETAMINOPHEN 5-325 MG PO TABS: 1.0000 | ORAL_TABLET | Freq: Four times a day (QID) | ORAL | Status: AC | PRN

## 2011-01-16 NOTE — Progress Notes (Signed)
Subjective:    Patient ID: Johnny Sanford, male    DOB: 1940-05-19, 70 y.o.   MRN: [id number]  HPI 70YO male with h/o HL and chronic arthritis pain presents for follow up.  He reports that he has been feeling well. He continues to walk on almost a daily basis.  He denies any chest pain, dyspnea. He has had cough productive of purulent sputum over the last few weeks. The cough is most pronounced in the morning.  He does not have fever or chills. He does note nasal congestion. His wife is sick with similar symptoms.  He also notes recent increase in left shoulder pain secondary to arthritis. He has been using aleve with minimal improvement.  He denies any weakness in left arm or numbness.  He denies any swelling in the joint.  He notes several week history of insomnia. He has difficulty staying asleep. He has tried OTC melatonin with no improvement His sleep is improved with xanax, but only lasts for a couple of hours.  Outpatient Encounter Prescriptions as of 01/16/2011  Medication Sig Dispense Refill  . ALPRAZolam (XANAX) 0.25 MG tablet Take 0.5 mg by mouth daily as needed.       Marland Kitchen aspirin 81 MG EC tablet Take 162 mg by mouth daily.       . Calcium Carb-Cholecalciferol (CALCIUM 1000 + D PO) Take 1 tablet by mouth daily.        Marland Kitchen FLUDROCORTISONE ACETATE PO Take 0.5 tablets by mouth daily. 0.1 mg       . fluticasone (FLONASE) 50 MCG/ACT nasal spray Place 1 spray into the nose daily.  16 g  2  . hydroxyurea (HYDREA) 500 MG capsule Take 500 mg by mouth as directed. May take with food to minimize GI side effects.       Marland Kitchen levothyroxine (SYNTHROID, LEVOTHROID) 137 MCG tablet Take 1 tablet (137 mcg total) by mouth daily.  90 tablet  1  . OMEGA-3 1000 MG CAPS Take 1 capsule by mouth daily.        Marland Kitchen omeprazole (PRILOSEC) 20 MG capsule Take 20 mg by mouth daily.          Review of Systems  Constitutional: Negative for fever, chills, activity change, appetite change, fatigue and unexpected weight change.    HENT: Positive for congestion, rhinorrhea and postnasal drip.   Eyes: Negative for visual disturbance.  Respiratory: Positive for cough. Negative for shortness of breath.   Cardiovascular: Negative for chest pain, palpitations and leg swelling.  Gastrointestinal: Negative for abdominal pain and abdominal distention.  Genitourinary: Negative for dysuria, urgency and difficulty urinating.  Musculoskeletal: Positive for myalgias and arthralgias (left shoulder). Negative for gait problem.  Skin: Negative for color change and rash.  Hematological: Negative for adenopathy.  Psychiatric/Behavioral: Negative for sleep disturbance and dysphoric mood. The patient is not nervous/anxious.    BP 124/86  Pulse 87  Temp(Src) 97.9 F (36.6 C) (Oral)  Resp 18  Ht 5\' 6"  (1.676 m)  Wt 190 lb (86.183 kg)  BMI 30.67 kg/m2  SpO2 93%     Objective:   Physical Exam  Constitutional: He is oriented to person, place, and time. He appears well-developed and well-nourished. No distress.  HENT:  Head: Normocephalic and atraumatic.  Right Ear: External ear normal.  Left Ear: External ear normal.  Nose: Nose normal.  Mouth/Throat: Oropharynx is clear and moist. No oropharyngeal exudate.  Eyes: Conjunctivae and EOM are normal. Pupils are equal, round, and reactive to light.  Right eye exhibits no discharge. Left eye exhibits no discharge. No scleral icterus.  Neck: Normal range of motion. Neck supple. No tracheal deviation present. No thyromegaly present.  Cardiovascular: Normal rate, regular rhythm and normal heart sounds.  Exam reveals no gallop and no friction rub.   No murmur heard. Pulmonary/Chest: Effort normal. No respiratory distress. He has no wheezes. He has rhonchi in the right middle field and the right lower field. He has no rales. He exhibits no tenderness.  Musculoskeletal: Normal range of motion. He exhibits no edema.  Lymphadenopathy:    He has no cervical adenopathy.  Neurological: He is  alert and oriented to person, place, and time. No cranial nerve deficit. Coordination normal.  Skin: Skin is warm and dry. No rash noted. He is not diaphoretic. No erythema. No pallor.  Psychiatric: He has a normal mood and affect. His behavior is normal. Judgment and thought content normal.          Assessment & Plan:  1. Insomnia -will try using trazodone. Patient will followup in one month or sooner if symptoms are persistent.  2. Left shoulder pain -secondary to chronic arthritis. Using Aleve with minimal improvement. Will try using Vicodin as needed at night for severe pain. Follow up 1 month.  3. Hyperlipdemia - On simvastatin. Will check fasting lipids with labs.  4. Bronchitis - Will treat with doxycycline. Pt will call if symptoms are not improving.

## 2011-02-17 ENCOUNTER — Encounter: Payer: Self-pay | Admitting: Internal Medicine

## 2011-02-17 ENCOUNTER — Ambulatory Visit (INDEPENDENT_AMBULATORY_CARE_PROVIDER_SITE_OTHER): Payer: Medicare Other | Admitting: Internal Medicine

## 2011-02-17 VITALS — BP 110/72 | HR 104 | Temp 97.7°F | Ht 66.5 in | Wt 194.0 lb

## 2011-02-17 DIAGNOSIS — M2550 Pain in unspecified joint: Secondary | ICD-10-CM | POA: Insufficient documentation

## 2011-02-17 DIAGNOSIS — I10 Essential (primary) hypertension: Secondary | ICD-10-CM

## 2011-02-17 DIAGNOSIS — G4700 Insomnia, unspecified: Secondary | ICD-10-CM

## 2011-02-17 MED ORDER — TRAZODONE HCL 50 MG PO TABS: 50.0000 mg | ORAL_TABLET | Freq: Every evening | ORAL | Status: DC | PRN

## 2011-02-17 NOTE — Assessment & Plan Note (Addendum)
Secondary to osteoarthritis. Symptoms are currently well controlled with use of Vicodin as needed at bedtime. We'll plan to continue. He knows that this medication can make him drowsy in the limit the use to bedtime. He will followup in 3 months or sooner if needed.

## 2011-02-17 NOTE — Assessment & Plan Note (Signed)
Symptoms improved with use of trazodone. Encouraged him to try taking the full 50 mg tablet to see if symptoms even better controlled. He will followup in 3 months or sooner if needed.

## 2011-02-17 NOTE — Assessment & Plan Note (Signed)
Symptoms fairly well-controlled with increased salt intake. Will continue to monitor. Followup in 3 months.

## 2011-02-17 NOTE — Progress Notes (Signed)
Subjective:    Patient ID: Johnny Sanford, male    DOB: 1940/10/10, 71 y.o.   MRN: [id number]  HPI 71 year old male with a history of labile blood pressure in her recent episodes of insomnia presents for followup. At his last visit, we started trazodone to help with sleep. He reports some improvement in his insomnia with the use of this medication. He denies any noted side effects.  He also has a history of chronic arthritic pain in his shoulders. At his last visit, we added Vicodin as needed for severe pain. He reports that he has used this on approximately 3 occasions with resolution of his pain. He denies any noted side effects from this medication.  In regards to his blood pressure, he notes continued lability of his blood pressure readings. He notes that his blood pressure occasionally drops to 80s over 40s. He does occasionally have dizziness during these events. During these episodes he increases his salt intake with significant improvement in his symptoms.   Outpatient Encounter Prescriptions as of 02/17/2011  Medication Sig Dispense Refill  . ALPRAZolam (XANAX) 0.25 MG tablet Take 0.5 mg by mouth daily as needed.       Marland Kitchen aspirin 81 MG EC tablet Take 162 mg by mouth daily.       . Calcium Carb-Cholecalciferol (CALCIUM 1000 + D PO) Take 1 tablet by mouth daily.        Marland Kitchen FLUDROCORTISONE ACETATE PO Take 0.5 tablets by mouth daily. 0.1 mg       . fluticasone (FLONASE) 50 MCG/ACT nasal spray Place 1 spray into the nose daily.  16 g  2  . hydroxyurea (HYDREA) 500 MG capsule Take 500 mg by mouth as directed. May take with food to minimize GI side effects.       Marland Kitchen levothyroxine (SYNTHROID, LEVOTHROID) 137 MCG tablet Take 1 tablet (137 mcg total) by mouth daily.  90 tablet  1  . OMEGA-3 1000 MG CAPS Take 1 capsule by mouth daily.        Marland Kitchen omeprazole (PRILOSEC) 20 MG capsule Take 20 mg by mouth daily.        . simvastatin (ZOCOR) 20 MG tablet Take 20 mg by mouth daily.        . traZODone (DESYREL)  50 MG tablet Take 1 tablet (50 mg total) by mouth at bedtime as needed for sleep.  30 tablet  3    Review of Systems  Constitutional: Negative for fever, chills, activity change, appetite change, fatigue and unexpected weight change.  Eyes: Negative for visual disturbance.  Respiratory: Negative for cough and shortness of breath.   Cardiovascular: Negative for chest pain, palpitations and leg swelling.  Gastrointestinal: Negative for abdominal pain and abdominal distention.  Genitourinary: Negative for dysuria, urgency and difficulty urinating.  Musculoskeletal: Positive for arthralgias. Negative for gait problem.  Skin: Negative for color change and rash.  Hematological: Negative for adenopathy.  Psychiatric/Behavioral: Positive for sleep disturbance. Negative for dysphoric mood. The patient is not nervous/anxious.    BP 110/72  Pulse 104  Temp(Src) 97.7 F (36.5 C) (Oral)  Ht 5' 6.5" (1.689 m)  Wt 194 lb (87.998 kg)  BMI 30.84 kg/m2     Objective:   Physical Exam  Constitutional: He is oriented to person, place, and time. He appears well-developed and well-nourished. No distress.  HENT:  Head: Normocephalic and atraumatic.  Right Ear: External ear normal.  Left Ear: External ear normal.  Nose: Nose normal.  Mouth/Throat: Oropharynx is clear  and moist. No oropharyngeal exudate.  Eyes: Conjunctivae and EOM are normal. Pupils are equal, round, and reactive to light. Right eye exhibits no discharge. Left eye exhibits no discharge. No scleral icterus.  Neck: Normal range of motion. Neck supple. No tracheal deviation present. No thyromegaly present.  Cardiovascular: Normal rate, regular rhythm and normal heart sounds.  Exam reveals no gallop and no friction rub.   No murmur heard. Pulmonary/Chest: Effort normal and breath sounds normal. No respiratory distress. He has no wheezes. He has no rales. He exhibits no tenderness.  Musculoskeletal: Normal range of motion. He exhibits no  edema.  Lymphadenopathy:    He has no cervical adenopathy.  Neurological: He is alert and oriented to person, place, and time. No cranial nerve deficit. Coordination normal.  Skin: Skin is warm and dry. No rash noted. He is not diaphoretic. No erythema. No pallor.  Psychiatric: He has a normal mood and affect. His behavior is normal. Judgment and thought content normal.          Assessment & Plan:

## 2011-03-11 ENCOUNTER — Telehealth: Payer: Self-pay | Admitting: Internal Medicine

## 2011-03-11 ENCOUNTER — Ambulatory Visit (INDEPENDENT_AMBULATORY_CARE_PROVIDER_SITE_OTHER): Payer: Medicare Other | Admitting: Internal Medicine

## 2011-03-11 ENCOUNTER — Encounter: Payer: Self-pay | Admitting: Internal Medicine

## 2011-03-11 VITALS — BP 146/86 | HR 92 | Temp 98.5°F | Wt 191.0 lb

## 2011-03-11 DIAGNOSIS — M069 Rheumatoid arthritis, unspecified: Secondary | ICD-10-CM | POA: Insufficient documentation

## 2011-03-11 DIAGNOSIS — J45909 Unspecified asthma, uncomplicated: Secondary | ICD-10-CM

## 2011-03-11 DIAGNOSIS — R062 Wheezing: Secondary | ICD-10-CM

## 2011-03-11 DIAGNOSIS — J45901 Unspecified asthma with (acute) exacerbation: Secondary | ICD-10-CM

## 2011-03-11 MED ORDER — AMOXICILLIN-POT CLAVULANATE 875-125 MG PO TABS: 1.0000 | ORAL_TABLET | Freq: Two times a day (BID) | ORAL | Status: AC

## 2011-03-11 MED ORDER — METHYLPREDNISOLONE ACETATE PF 40 MG/ML IJ SUSP
40.0000 mg | Freq: Once | INTRAMUSCULAR | Status: AC
Start: 2011-03-11 — End: 2011-03-11
  Administered 2011-03-11: 40 mg via INTRAMUSCULAR

## 2011-03-11 MED ORDER — PREDNISONE (PAK) 10 MG PO TABS: ORAL_TABLET | ORAL | Status: AC

## 2011-03-11 NOTE — Telephone Encounter (Signed)
Triage Record Num: [id number] Operator: Patriciaann Clan Patient Name: Johnny Sanford Call Date & Time: 03/11/2011 11:52:44AM Patient Phone: [phone] PCP: Ronna Polio Patient Gender: Male PCP Fax : [phone] Patient DOB: May 23, 1940 Practice Name: [facility] Reason for Call: Caller: Nat/Patient; PCP: Ronna Polio; CB#: [phone]; ; ; Call regarding Cough/Congestion; Spouse/Nancy calling. States patient developed cough, chest congestion, runny nose, sinus pressure. Onset 03/08/11. Afebrile. States expectorating grey sputum. Denies dyspnea or wheezing. Denies chest pain. Triage per URI Protocol. No emergent sx identified. Care advice given per guidelines. Spouse avised increased fluids, warm fluids with honey, inhaled steam, humidifier, warm compresses to face. Call back parameters reviewed. Spouse verbalizes understanding. Appt, scheduled for 03/11/11 1615 with Dr. Darrick Huntsman. Protocol(s) Used: Upper Respiratory Infection (URI) Recommended Outcome per Protocol: See Provider within 24 hours Reason for Outcome: Productive cough with colored sputum (other than clear or white sputum) Care Advice: ~ Use a cool mist humidifier to moisten air. Be sure to clean according to manufacturer's instructions. ~ May inhale steam from hot shower or heated water. Be careful to avoid burns. Limit or avoid exposure to irritants and allergens (e.g. air pollution, smoke/smoking, chemicals, dust, pollen, pet dander, etc.) ~ Increase fluids to 8-12 eight oz (1.6 to 2.4 liters) glasses per day, half of them to be water. Soups, popsicles, fruit juices, non-caffeinated sodas (unless restricting sodium intake), jello, broths, decaf teas, etc. are all okay. Warm fluids can be soothing. ~ ~ Warm fluids may help, or try a mixture of honey and lemon juice in warm tea. ~ SYMPTOM / CONDITION MANAGEMENT Coughing up mucus or phlegm helps to get rid of an infection. A productive cough should  not be stopped. A cough medicine with guaifenesin (Robitussin, Mucinex) can help loosen the mucus. Cough medicine with dextromethorphan (DM) should be avoided. Drinking lots of fluids can help loosen the mucus too, especially warm fluids. ~ Call provider if has a fever over 101.5 F (38.6 C) that has not responded to home care measures, having shaking chills or any fever in someone immunocompromised/frail elderly. ~ 03/11/2011 12:07:18PM Page 1 of 1 [email or web address]

## 2011-03-11 NOTE — Progress Notes (Signed)
Subjective:    Patient ID: Johnny Sanford, male    DOB: 08-21-1940, 71 y.o.   MRN: [id number]  HPI  Johnny Sanford is a 71 year old white male who presents with One week history of sinus drainage,  Cough,  worsened after one week of otc products.  Cough is productive,  Grey in color.  No fevers, neck pain or sinus pain,  Having inner ear symptoms with dizziness. He has had no recent travel .  Past Medical History  Diagnosis Date  . HLD (hyperlipidemia)   . HTN (hypertension)   . CAD (coronary artery disease)   . GERD (gastroesophageal reflux disease)   . Hypothyroidism   . 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 CVA (cerebral vascular accident)    Current Outpatient Prescriptions on File Prior to Visit  Medication Sig Dispense Refill  . ALPRAZolam (XANAX) 0.25 MG tablet Take 0.5 mg by mouth daily as needed.       Marland Kitchen aspirin 81 MG EC tablet Take 162 mg by mouth daily.       . Calcium Carb-Cholecalciferol (CALCIUM 1000 + D PO) Take 1 tablet by mouth daily.        Marland Kitchen FLUDROCORTISONE ACETATE PO Take 0.5 tablets by mouth daily. 0.1 mg       . fluticasone (FLONASE) 50 MCG/ACT nasal spray Place 1 spray into the nose daily.  16 g  2  . hydroxyurea (HYDREA) 500 MG capsule Take 500 mg by mouth as directed. May take with food to minimize GI side effects.       Marland Kitchen levothyroxine (SYNTHROID, LEVOTHROID) 137 MCG tablet Take 1 tablet (137 mcg total) by mouth daily.  90 tablet  1  . OMEGA-3 1000 MG CAPS Take 1 capsule by mouth daily.        Marland Kitchen omeprazole (PRILOSEC) 20 MG capsule Take 20 mg by mouth daily.        . simvastatin (ZOCOR) 20 MG tablet Take 20 mg by mouth daily.        . traZODone (DESYREL) 50 MG tablet Take 1 tablet (50 mg total) by mouth at bedtime as needed for sleep.  30 tablet  3    Review of Systems  Constitutional: Negative for fever, chills, diaphoresis, activity change, appetite change, fatigue and unexpected weight change.  HENT: Positive for congestion, rhinorrhea and postnasal drip. Negative  for hearing loss, ear pain, nosebleeds, sore throat, facial swelling, sneezing, drooling, mouth sores, trouble swallowing, neck pain, neck stiffness, dental problem, voice change, sinus pressure, tinnitus and ear discharge.   Eyes: Negative for photophobia, pain, discharge, redness, itching and visual disturbance.  Respiratory: Positive for cough. Negative for apnea, choking, chest tightness, shortness of breath, wheezing and stridor.   Cardiovascular: Negative for chest pain, palpitations and leg swelling.  Gastrointestinal: Negative for nausea, vomiting, abdominal pain, diarrhea, constipation, blood in stool, abdominal distention, anal bleeding and rectal pain.  Genitourinary: Negative for dysuria, urgency, frequency, hematuria, flank pain, decreased urine volume, scrotal swelling, difficulty urinating and testicular pain.  Musculoskeletal: Negative for myalgias, back pain, joint swelling, arthralgias and gait problem.  Skin: Negative for color change, rash and wound.  Neurological: Negative for dizziness, tremors, seizures, syncope, speech difficulty, weakness, light-headedness, numbness and headaches.  Psychiatric/Behavioral: Negative for suicidal ideas, hallucinations, behavioral problems, confusion, sleep disturbance, dysphoric mood, decreased concentration and agitation. The patient is not nervous/anxious.      BP 146/86  Pulse 92  Temp(Src) 98.5 F (36.9 C) (Oral)  Wt 191 lb (  86.637 kg)  SpO2 95%     Objective:   Physical Exam  Constitutional: He is oriented to person, place, and time.  HENT:  Head: Normocephalic and atraumatic.  Mouth/Throat: Oropharynx is clear and moist.  Eyes: Conjunctivae and EOM are normal.  Neck: Normal range of motion. Neck supple. No JVD present. No thyromegaly present.  Cardiovascular: Normal rate, regular rhythm and normal heart sounds.   Pulmonary/Chest: Effort normal. He has no wheezes. He has rales.  Abdominal: Soft. Bowel sounds are normal. He  exhibits no mass. There is no tenderness. There is no rebound.  Musculoskeletal: Normal range of motion. He exhibits no edema.  Neurological: He is alert and oriented to person, place, and time.  Skin: Skin is warm and dry.  Psychiatric: He has a normal mood and affect.   ronchorus and wheezing,  diminished sounds on right         Assessment & Plan:

## 2011-03-11 NOTE — Patient Instructions (Signed)
Augmentin 1 tablet twice daily with breakfast and supper  Start tonight   Prednisone taper:  6 -5-4-3-2-1-  Start tomorrow   albuterol inhaler 2 puffs up to 4 times daily if needed for wheezing

## 2011-03-11 NOTE — Assessment & Plan Note (Signed)
affectin ghadns only,  But can still work on [employer] for a living,  No loss of ROM despite ulnar deviation

## 2011-03-11 NOTE — Telephone Encounter (Signed)
Patient has a runny nose ,cough and congestion. Would like something called into the pharmacy.

## 2011-03-13 ENCOUNTER — Encounter: Payer: Self-pay | Admitting: Internal Medicine

## 2011-03-13 DIAGNOSIS — J209 Acute bronchitis, unspecified: Secondary | ICD-10-CM | POA: Insufficient documentation

## 2011-03-13 NOTE — Assessment & Plan Note (Addendum)
Will treat with prednisone, albuterol  and empiric antibiotics given both upper and lower respiratory symptoms .  Follow up in 10 days with Dr. Dan Humphreys to repeat lung exam.

## 2011-03-20 ENCOUNTER — Ambulatory Visit (INDEPENDENT_AMBULATORY_CARE_PROVIDER_SITE_OTHER)
Admission: RE | Admit: 2011-03-20 | Discharge: 2011-03-20 | Disposition: A | Discharge status: Home / Self Care | Payer: Medicare Other | Source: Ambulatory Visit | Attending: Internal Medicine | Admitting: Internal Medicine

## 2011-03-20 ENCOUNTER — Ambulatory Visit (INDEPENDENT_AMBULATORY_CARE_PROVIDER_SITE_OTHER): Payer: Medicare Other | Admitting: Internal Medicine

## 2011-03-20 ENCOUNTER — Encounter: Payer: Self-pay | Admitting: Internal Medicine

## 2011-03-20 VITALS — BP 112/72 | HR 85 | Temp 98.1°F | Ht 66.5 in | Wt 192.0 lb

## 2011-03-20 DIAGNOSIS — J45909 Unspecified asthma, uncomplicated: Secondary | ICD-10-CM

## 2011-03-20 DIAGNOSIS — J209 Acute bronchitis, unspecified: Secondary | ICD-10-CM

## 2011-03-20 DIAGNOSIS — J189 Pneumonia, unspecified organism: Secondary | ICD-10-CM

## 2011-03-20 DIAGNOSIS — J45901 Unspecified asthma with (acute) exacerbation: Secondary | ICD-10-CM

## 2011-03-20 MED ORDER — LEVOFLOXACIN 750 MG PO TABS: 750.0000 mg | ORAL_TABLET | Freq: Every day | ORAL | Status: DC

## 2011-03-20 NOTE — Progress Notes (Signed)
Subjective:    Patient ID: Johnny Sanford, male    DOB: 30-Nov-1940, 70 y.o.   MRN: [id number]  HPI  71 year old male presents for followup after recent episode of bronchitis. He reports that his symptoms have gradually improved with the use of prednisone and Augmentin. He still occasionally has some episodes of mild shortness of breath. He denies any fever or chills. He continues to have cough which is productive of yellow sputum, however this is improved.  Outpatient Encounter Prescriptions as of 03/20/2011  Medication Sig Dispense Refill  . ALPRAZolam (XANAX) 0.25 MG tablet Take 0.5 mg by mouth daily as needed.       Marland Kitchen amoxicillin-clavulanate (AUGMENTIN) 875-125 MG per tablet Take 1 tablet by mouth 2 (two) times daily. 6 tablets on Day 1 , decrease by 1 tablet daily until gone  20 tablet  10  . aspirin 81 MG EC tablet Take 162 mg by mouth daily.       . Calcium Carb-Cholecalciferol (CALCIUM 1000 + D PO) Take 1 tablet by mouth daily.        Marland Kitchen FLUDROCORTISONE ACETATE PO Take 0.5 tablets by mouth daily. 0.1 mg       . fluticasone (FLONASE) 50 MCG/ACT nasal spray Place 1 spray into the nose daily.  16 g  2  . hydroxyurea (HYDREA) 500 MG capsule Take 500 mg by mouth as directed. May take with food to minimize GI side effects.       Marland Kitchen levothyroxine (SYNTHROID, LEVOTHROID) 137 MCG tablet Take 1 tablet (137 mcg total) by mouth daily.  90 tablet  1  . OMEGA-3 1000 MG CAPS Take 1 capsule by mouth daily.        Marland Kitchen omeprazole (PRILOSEC) 20 MG capsule Take 20 mg by mouth daily.        . predniSONE (STERAPRED UNI-PAK) 10 MG tablet 6 tablets on day 1, decrease by tablet daily until gone  21 tablet  0  . simvastatin (ZOCOR) 20 MG tablet Take 20 mg by mouth daily.        . traZODone (DESYREL) 50 MG tablet Take 50 mg by mouth at bedtime as needed.        Review of Systems  Constitutional: Negative for fever, chills, activity change and fatigue.  HENT: Negative for hearing loss, ear pain, nosebleeds,  congestion, sore throat, rhinorrhea, sneezing, trouble swallowing, neck pain, neck stiffness, voice change, postnasal drip, sinus pressure, tinnitus and ear discharge.   Eyes: Negative for discharge, redness, itching and visual disturbance.  Respiratory: Positive for cough and shortness of breath. Negative for chest tightness, wheezing and stridor.   Cardiovascular: Negative for chest pain and leg swelling.  Musculoskeletal: Negative for myalgias and arthralgias.  Skin: Negative for color change and rash.  Neurological: Negative for dizziness, facial asymmetry and headaches.  Psychiatric/Behavioral: Negative for sleep disturbance.   BP 112/72  Pulse 85  Temp(Src) 98.1 F (36.7 C) (Oral)  Ht 5' 6.5" (1.689 m)  Wt 192 lb (87.091 kg)  BMI 30.53 kg/m2  SpO2 92%     Objective:   Physical Exam  Constitutional: He is oriented to person, place, and time. He appears well-developed and well-nourished. No distress.  HENT:  Head: Normocephalic and atraumatic.  Right Ear: External ear normal.  Left Ear: External ear normal.  Nose: Nose normal.  Mouth/Throat: Oropharynx is clear and moist. No oropharyngeal exudate.  Eyes: Conjunctivae and EOM are normal. Pupils are equal, round, and reactive to light. Right eye exhibits no discharge.  Left eye exhibits no discharge. No scleral icterus.  Neck: Normal range of motion. Neck supple. No tracheal deviation present. No thyromegaly present.  Cardiovascular: Normal rate, regular rhythm and normal heart sounds.  Exam reveals no gallop and no friction rub.   No murmur heard. Pulmonary/Chest: Effort normal. No accessory muscle usage. Not tachypneic. No respiratory distress. He has no decreased breath sounds. He has no wheezes. He has rhonchi in the right middle field and the right lower field. He has no rales. He exhibits no tenderness.  Musculoskeletal: Normal range of motion. He exhibits no edema.  Lymphadenopathy:    He has no cervical adenopathy.    Neurological: He is alert and oriented to person, place, and time. No cranial nerve deficit. Coordination normal.  Skin: Skin is warm and dry. No rash noted. He is not diaphoretic. No erythema. No pallor.  Psychiatric: He has a normal mood and affect. His behavior is normal. Judgment and thought content normal.          Assessment & Plan:

## 2011-03-20 NOTE — Assessment & Plan Note (Signed)
Symptoms have improved but not completely resolved. Chest x-ray today showed no evidence of pneumonia. Will plan for him to finish course of Augmentin and use albuterol as needed. He will call if symptoms do not continue to gradually improve. Followup in one week.

## 2011-03-21 ENCOUNTER — Ambulatory Visit: Payer: Medicare Other | Admitting: Internal Medicine

## 2011-03-27 ENCOUNTER — Encounter: Payer: Self-pay | Admitting: Internal Medicine

## 2011-03-27 ENCOUNTER — Ambulatory Visit (INDEPENDENT_AMBULATORY_CARE_PROVIDER_SITE_OTHER): Payer: Medicare Other | Admitting: Internal Medicine

## 2011-03-27 VITALS — BP 90/64 | HR 86 | Temp 98.6°F | Wt 176.8 lb

## 2011-03-27 DIAGNOSIS — J40 Bronchitis, not specified as acute or chronic: Secondary | ICD-10-CM

## 2011-03-27 DIAGNOSIS — J45901 Unspecified asthma with (acute) exacerbation: Secondary | ICD-10-CM

## 2011-03-27 DIAGNOSIS — J45909 Unspecified asthma, uncomplicated: Secondary | ICD-10-CM

## 2011-03-27 MED ORDER — FLUTICASONE-SALMETEROL 100-50 MCG/DOSE IN AEPB: 1.0000 | INHALATION_SPRAY | Freq: Two times a day (BID) | RESPIRATORY_TRACT | Status: DC

## 2011-03-27 NOTE — Assessment & Plan Note (Signed)
Symptoms improved, however prolonged inspiration still noted on exam. Patient continues to have some nonproductive cough. Will add Advair to help with inflammation and bronchospasm. Patient would prefer not to repeat course of steroids. He will followup in one week for recheck.

## 2011-03-27 NOTE — Progress Notes (Signed)
Subjective:    Patient ID: Johnny Sanford, male    DOB: [birth year], 71 y.o.   MRN: [id number]  HPI 71 year old male with history of asthma presents for followup after recent episode of bronchitis. He was treated with antibiotics and steroid taper. He also used albuterol as needed. After his last visit, he had chest x-ray which showed no evidence of focal pneumonia. He reports that symptoms of nonproductive cough and shortness of breath or gradually improving. He denies any recurrent fever or chills.  Outpatient Encounter Prescriptions as of 03/27/2011  Medication Sig Dispense Refill  . ALPRAZolam (XANAX) 0.25 MG tablet Take 0.5 mg by mouth daily as needed.       Marland Kitchen aspirin 81 MG EC tablet Take 162 mg by mouth daily.       . Calcium Carb-Cholecalciferol (CALCIUM 1000 + D PO) Take 1 tablet by mouth daily.        Marland Kitchen FLUDROCORTISONE ACETATE PO Take 0.5 tablets by mouth daily. 0.1 mg       . fluticasone (FLONASE) 50 MCG/ACT nasal spray Place 1 spray into the nose daily.  16 g  2  . hydroxyurea (HYDREA) 500 MG capsule Take 500 mg by mouth as directed. May take with food to minimize GI side effects.       Marland Kitchen levothyroxine (SYNTHROID, LEVOTHROID) 137 MCG tablet Take 1 tablet (137 mcg total) by mouth daily.  90 tablet  1  . OMEGA-3 1000 MG CAPS Take 1 capsule by mouth daily.        Marland Kitchen omeprazole (PRILOSEC) 20 MG capsule Take 20 mg by mouth daily.        . simvastatin (ZOCOR) 20 MG tablet Take 20 mg by mouth daily.        . traZODone (DESYREL) 50 MG tablet Take 50 mg by mouth at bedtime as needed.      . Fluticasone-Salmeterol (ADVAIR) 100-50 MCG/DOSE AEPB Inhale 1 puff into the lungs 2 (two) times daily.  1 each  3    Review of Systems  Constitutional: Negative for fever, chills, activity change, appetite change, fatigue and unexpected weight change.  Eyes: Negative for visual disturbance.  Respiratory: Positive for cough, shortness of breath and wheezing.   Cardiovascular: Negative for chest pain,  palpitations and leg swelling.  Gastrointestinal: Negative for abdominal pain and abdominal distention.  Genitourinary: Negative for dysuria, urgency and difficulty urinating.  Musculoskeletal: Negative for arthralgias and gait problem.  Skin: Negative for color change and rash.  Hematological: Negative for adenopathy.  Psychiatric/Behavioral: Negative for sleep disturbance and dysphoric mood. The patient is not nervous/anxious.    BP 90/64  Pulse 86  Temp(Src) 98.6 F (37 C) (Oral)  Wt 176 lb 12 oz (80.173 kg)  SpO2 96%     Objective:   Physical Exam  Constitutional: He is oriented to person, place, and time. He appears well-developed and well-nourished. No distress.  HENT:  Head: Normocephalic and atraumatic.  Right Ear: External ear normal.  Left Ear: External ear normal.  Nose: Nose normal.  Mouth/Throat: Oropharynx is clear and moist. No oropharyngeal exudate.  Eyes: Conjunctivae and EOM are normal. Pupils are equal, round, and reactive to light. Right eye exhibits no discharge. Left eye exhibits no discharge. No scleral icterus.  Neck: Normal range of motion. Neck supple. No tracheal deviation present. No thyromegaly present.  Cardiovascular: Normal rate, regular rhythm and normal heart sounds.  Exam reveals no gallop and no friction rub.   No murmur heard. Pulmonary/Chest: Effort normal.  No respiratory distress. He has decreased breath sounds (prolonged expiration). He has wheezes in the left upper field, the left middle field and the left lower field. He has rales in the left upper field, the left middle field and the left lower field. He exhibits no tenderness.  Musculoskeletal: Normal range of motion. He exhibits no edema.  Lymphadenopathy:    He has no cervical adenopathy.  Neurological: He is alert and oriented to person, place, and time. No cranial nerve deficit. Coordination normal.  Skin: Skin is warm and dry. No rash noted. He is not diaphoretic. No erythema. No  pallor.  Psychiatric: He has a normal mood and affect. His behavior is normal. Judgment and thought content normal.          Assessment & Plan:

## 2011-04-02 ENCOUNTER — Encounter: Payer: Self-pay | Admitting: Internal Medicine

## 2011-04-02 ENCOUNTER — Ambulatory Visit: Payer: Self-pay | Admitting: Oncology

## 2011-04-02 ENCOUNTER — Ambulatory Visit (INDEPENDENT_AMBULATORY_CARE_PROVIDER_SITE_OTHER): Payer: Medicare Other | Admitting: Internal Medicine

## 2011-04-02 VITALS — BP 140/80 | HR 80 | Temp 97.7°F | Wt 196.0 lb

## 2011-04-02 DIAGNOSIS — I517 Cardiomegaly: Secondary | ICD-10-CM | POA: Insufficient documentation

## 2011-04-02 DIAGNOSIS — J449 Chronic obstructive pulmonary disease, unspecified: Secondary | ICD-10-CM | POA: Insufficient documentation

## 2011-04-02 DIAGNOSIS — J45901 Unspecified asthma with (acute) exacerbation: Secondary | ICD-10-CM

## 2011-04-02 DIAGNOSIS — J209 Acute bronchitis, unspecified: Secondary | ICD-10-CM

## 2011-04-02 LAB — CBC CANCER CENTER
Basophil #: 0 x10 3/mm (ref 0.0–0.1)
Eosinophil %: 1.5 %
HCT: 33.5 % — ABNORMAL LOW (ref 40.0–52.0)
Lymphocyte #: 0.7 x10 3/mm — ABNORMAL LOW (ref 1.0–3.6)
Lymphocyte %: 24.3 %
MCV: 127.5 fL — ABNORMAL HIGH (ref 80–100)
Monocyte %: 14 %
Neutrophil #: 1.8 x10 3/mm (ref 1.4–6.5)
Platelet: 406 x10 3/mm (ref 150–440)
RBC: 2.62 10*6/uL — ABNORMAL LOW (ref 4.40–5.90)
RDW: 13.7 % (ref 11.5–14.5)
WBC: 2.9 x10 3/mm — ABNORMAL LOW (ref 3.8–10.6)

## 2011-04-02 MED ORDER — BUDESONIDE-FORMOTEROL FUMARATE 160-4.5 MCG/ACT IN AERO: 2.0000 | INHALATION_SPRAY | Freq: Two times a day (BID) | RESPIRATORY_TRACT | Status: DC

## 2011-04-02 NOTE — Assessment & Plan Note (Signed)
Symptoms improving. Will continue use of long-acting steroid and bronchodilator. Will try changing to Symbicort today given cost of Advair.  He will also continue to use albuterol as needed.Patient will followup in one month. He will call sooner if symptoms are worsening.

## 2011-04-02 NOTE — Progress Notes (Signed)
Subjective:    Patient ID: Johnny Sanford, male    DOB: 02/10/1940, 71 y.o.   MRN: [id number]  HPI 71 year old male with history of COPD presents for followup after recent episode of bronchitis. He was recently started on Advair. He reports some improvement in his shortness of breath and cough. He continues to have cough which is most prominent in the morning. It is productive of clear to yellow sputum. His coughing improves with the use of albuterol. He reports that he's been using his albuterol up to 3 times daily. He denies any recurrent fever or chills. He denies any chest pain.  Outpatient Encounter Prescriptions as of 04/02/2011  Medication Sig Dispense Refill  . ALPRAZolam (XANAX) 0.25 MG tablet Take 0.5 mg by mouth daily as needed.       Marland Kitchen aspirin 81 MG EC tablet Take 162 mg by mouth daily.       . Calcium Carb-Cholecalciferol (CALCIUM 1000 + D PO) Take 1 tablet by mouth daily.        Marland Kitchen FLUDROCORTISONE ACETATE PO Take 0.5 tablets by mouth daily. 0.1 mg       . fluticasone (FLONASE) 50 MCG/ACT nasal spray Place 1 spray into the nose daily.  16 g  2  . hydroxyurea (HYDREA) 500 MG capsule Take 500 mg by mouth as directed. May take with food to minimize GI side effects.       Marland Kitchen levothyroxine (SYNTHROID, LEVOTHROID) 137 MCG tablet Take 1 tablet (137 mcg total) by mouth daily.  90 tablet  1  . OMEGA-3 1000 MG CAPS Take 1 capsule by mouth daily.        Marland Kitchen omeprazole (PRILOSEC) 20 MG capsule Take 20 mg by mouth daily.        . simvastatin (ZOCOR) 20 MG tablet Take 20 mg by mouth daily.        . traZODone (DESYREL) 50 MG tablet Take 50 mg by mouth at bedtime as needed.      Marland Kitchen DISCONTD: Fluticasone-Salmeterol (ADVAIR) 100-50 MCG/DOSE AEPB Inhale 1 puff into the lungs 2 (two) times daily.  1 each  3  . budesonide-formoterol (SYMBICORT) 160-4.5 MCG/ACT inhaler Inhale 2 puffs into the lungs 2 (two) times daily.  1 Inhaler  3    Review of Systems  Constitutional: Negative for fever, chills, activity  change, appetite change, fatigue and unexpected weight change.  Eyes: Negative for visual disturbance.  Respiratory: Positive for cough and shortness of breath.   Cardiovascular: Negative for chest pain, palpitations and leg swelling.  Gastrointestinal: Negative for abdominal pain and abdominal distention.  Genitourinary: Negative for dysuria, urgency and difficulty urinating.  Musculoskeletal: Negative for arthralgias and gait problem.  Skin: Negative for color change and rash.  Hematological: Negative for adenopathy.  Psychiatric/Behavioral: Negative for sleep disturbance and dysphoric mood. The patient is not nervous/anxious.    BP 140/80  Pulse 80  Temp(Src) 97.7 F (36.5 C) (Oral)  Wt 196 lb (88.905 kg)  SpO2 95%     Objective:   Physical Exam  Constitutional: He is oriented to person, place, and time. He appears well-developed and well-nourished. No distress.  HENT:  Head: Normocephalic and atraumatic.  Right Ear: External ear normal.  Left Ear: External ear normal.  Nose: Nose normal.  Mouth/Throat: Oropharynx is clear and moist. No oropharyngeal exudate.  Eyes: Conjunctivae and EOM are normal. Pupils are equal, round, and reactive to light. Right eye exhibits no discharge. Left eye exhibits no discharge. No scleral icterus.  Neck:  Normal range of motion. Neck supple. No tracheal deviation present. No thyromegaly present.  Cardiovascular: Normal rate, regular rhythm and normal heart sounds.  Exam reveals no gallop and no friction rub.   No murmur heard. Pulmonary/Chest: Effort normal. No accessory muscle usage. Not tachypneic. No respiratory distress. He has decreased breath sounds (prolonged exp). He has no wheezes. He has rhonchi in the right middle field and the right lower field. He has no rales. He exhibits no tenderness.  Musculoskeletal: Normal range of motion. He exhibits no edema.  Lymphadenopathy:    He has no cervical adenopathy.  Neurological: He is alert and  oriented to person, place, and time. No cranial nerve deficit. Coordination normal.  Skin: Skin is warm and dry. No rash noted. He is not diaphoretic. No erythema. No pallor.  Psychiatric: He has a normal mood and affect. His behavior is normal. Judgment and thought content normal.          Assessment & Plan:

## 2011-04-02 NOTE — Assessment & Plan Note (Signed)
Noted on chest x-ray. Patient is status post chemotherapy and radiation to his chest and neck. No pulmonary edema was noted on x-ray, however question whether LV dysfunction might be leading to mild pulmonary edema worsening his cough. Will get echo.

## 2011-04-03 ENCOUNTER — Other Ambulatory Visit: Payer: Self-pay | Admitting: *Deleted

## 2011-04-03 MED ORDER — SIMVASTATIN 20 MG PO TABS: 20.0000 mg | ORAL_TABLET | Freq: Every day | ORAL | Status: DC

## 2011-04-15 ENCOUNTER — Other Ambulatory Visit: Payer: Self-pay

## 2011-04-15 ENCOUNTER — Other Ambulatory Visit (INDEPENDENT_AMBULATORY_CARE_PROVIDER_SITE_OTHER): Payer: Medicare Other

## 2011-04-15 DIAGNOSIS — I517 Cardiomegaly: Secondary | ICD-10-CM

## 2011-04-28 ENCOUNTER — Ambulatory Visit: Payer: Self-pay | Admitting: Oncology

## 2011-05-01 ENCOUNTER — Encounter: Payer: Self-pay | Admitting: Internal Medicine

## 2011-05-01 MED ORDER — ALPRAZOLAM 0.5 MG PO TABS: 0.5000 mg | ORAL_TABLET | Freq: Every day | ORAL | Status: DC | PRN

## 2011-05-02 ENCOUNTER — Telehealth: Payer: Self-pay | Admitting: *Deleted

## 2011-05-02 ENCOUNTER — Other Ambulatory Visit: Payer: Self-pay | Admitting: *Deleted

## 2011-05-02 ENCOUNTER — Encounter: Payer: Self-pay | Admitting: Internal Medicine

## 2011-05-02 MED ORDER — OMEPRAZOLE 20 MG PO CPDR: 20.0000 mg | DELAYED_RELEASE_CAPSULE | Freq: Every day | ORAL | Status: DC

## 2011-05-02 NOTE — Telephone Encounter (Signed)
Pt sent another message - need to call in xanax, md printed but I was not made aware that it was approved.

## 2011-05-02 NOTE — Telephone Encounter (Signed)
Called in.

## 2011-05-06 ENCOUNTER — Ambulatory Visit (INDEPENDENT_AMBULATORY_CARE_PROVIDER_SITE_OTHER): Payer: Medicare Other | Admitting: Internal Medicine

## 2011-05-06 ENCOUNTER — Encounter: Payer: Self-pay | Admitting: Internal Medicine

## 2011-05-06 VITALS — BP 100/68 | HR 76 | Temp 98.0°F | Ht 66.5 in | Wt 193.8 lb

## 2011-05-06 DIAGNOSIS — F411 Generalized anxiety disorder: Secondary | ICD-10-CM

## 2011-05-06 DIAGNOSIS — M62830 Muscle spasm of back: Secondary | ICD-10-CM | POA: Insufficient documentation

## 2011-05-06 DIAGNOSIS — F419 Anxiety disorder, unspecified: Secondary | ICD-10-CM

## 2011-05-06 DIAGNOSIS — M539 Dorsopathy, unspecified: Secondary | ICD-10-CM

## 2011-05-06 MED ORDER — ALPRAZOLAM 0.5 MG PO TABS: 0.5000 mg | ORAL_TABLET | Freq: Three times a day (TID) | ORAL | Status: DC | PRN

## 2011-05-06 MED ORDER — CYCLOBENZAPRINE HCL 5 MG PO TABS: ORAL_TABLET | ORAL | Status: DC

## 2011-05-06 NOTE — Progress Notes (Signed)
Subjective:    Patient ID: Johnny Sanford, male    DOB: Jun 19, 1940, 71 y.o.   MRN: [id number]  HPI 71 year old male with history of hypertension, hypothyroidism, and anxiety presents for acute visit complaining of two-week history of lower back pain. He reports that the pain first began after he tried to lift a lawnmower. He describes the pain as a catching or cramping in his lower back on both sides of his mid spine. He went to see a chiropractor and reports he had imaging performed which was normal. He had no improvement with chiropractic adjustments. He continues to have some intermittent cramping pain in his lower back which occasionally radiates down his bilateral posterior legs. He denies weakness in his legs. He has not had any falls.  Outpatient Encounter Prescriptions as of 05/06/2011  Medication Sig Dispense Refill  . ALPRAZolam (XANAX) 0.5 MG tablet Take 1 tablet (0.5 mg total) by mouth 3 (three) times daily as needed.  90 tablet  3  . aspirin 81 MG EC tablet Take 162 mg by mouth daily.       . budesonide-formoterol (SYMBICORT) 160-4.5 MCG/ACT inhaler Inhale 2 puffs into the lungs 2 (two) times daily.  1 Inhaler  3  . Calcium Carb-Cholecalciferol (CALCIUM 1000 + D PO) Take 1 tablet by mouth daily.        Marland Kitchen FLUDROCORTISONE ACETATE PO Take 0.5 tablets by mouth daily. 0.1 mg       . fluticasone (FLONASE) 50 MCG/ACT nasal spray Place 1 spray into the nose daily as needed.      . hydroxyurea (HYDREA) 500 MG capsule Take 500 mg by mouth as directed. May take with food to minimize GI side effects.       Marland Kitchen levothyroxine (SYNTHROID, LEVOTHROID) 137 MCG tablet Take 1 tablet (137 mcg total) by mouth daily.  90 tablet  1  . OMEGA-3 1000 MG CAPS Take 1 capsule by mouth daily.        Marland Kitchen omeprazole (PRILOSEC) 20 MG capsule Take 1 capsule (20 mg total) by mouth daily.  90 capsule  1  . simvastatin (ZOCOR) 20 MG tablet Take 1 tablet (20 mg total) by mouth daily.  90 tablet  1  . traZODone (DESYREL) 50 MG  tablet Take 50 mg by mouth at bedtime as needed.      Marland Kitchen DISCONTD: ALPRAZolam (XANAX) 0.5 MG tablet Take 1 tablet (0.5 mg total) by mouth daily as needed.  30 tablet  3  . DISCONTD: fluticasone (FLONASE) 50 MCG/ACT nasal spray Place 1 spray into the nose daily.  16 g  2  . cyclobenzaprine (FLEXERIL) 5 MG tablet 1-2 tablets up to three times daily as needed  90 tablet  1  . traZODone (DESYREL) 50 MG tablet Take 0.5-1 tablets (25-50 mg total) by mouth at bedtime as needed for sleep.  30 tablet  3    Review of Systems  Constitutional: Negative for fever, chills, activity change, appetite change, fatigue and unexpected weight change.  Eyes: Negative for visual disturbance.  Respiratory: Negative for cough and shortness of breath.   Cardiovascular: Negative for chest pain, palpitations and leg swelling.  Gastrointestinal: Negative for abdominal pain and abdominal distention.  Genitourinary: Negative for dysuria, urgency and difficulty urinating.  Musculoskeletal: Positive for myalgias, back pain and arthralgias. Negative for gait problem.  Skin: Negative for color change and rash.  Hematological: Negative for adenopathy.  Psychiatric/Behavioral: Negative for sleep disturbance and dysphoric mood. The patient is not nervous/anxious.  BP 100/68  Pulse 76  Temp(Src) 98 F (36.7 C) (Oral)  Ht 5' 6.5" (1.689 m)  Wt 193 lb 12 oz (87.884 kg)  BMI 30.80 kg/m2  SpO2 98%     Objective:   Physical Exam  Constitutional: He is oriented to person, place, and time. He appears well-developed and well-nourished. No distress.  HENT:  Head: Normocephalic and atraumatic.  Right Ear: External ear normal.  Left Ear: External ear normal.  Nose: Nose normal.  Mouth/Throat: Oropharynx is clear and moist. No oropharyngeal exudate.  Eyes: Conjunctivae and EOM are normal. Pupils are equal, round, and reactive to light. Right eye exhibits no discharge. Left eye exhibits no discharge. No scleral icterus.  Neck:  Normal range of motion. Neck supple. No tracheal deviation present. No thyromegaly present.  Cardiovascular: Normal rate, regular rhythm and normal heart sounds.  Exam reveals no gallop and no friction rub.   No murmur heard. Pulmonary/Chest: Effort normal and breath sounds normal. No respiratory distress. He has no wheezes. He has no rales. He exhibits no tenderness.  Musculoskeletal: He exhibits no edema.       Lumbar back: He exhibits decreased range of motion, tenderness, pain and spasm.  Lymphadenopathy:    He has no cervical adenopathy.  Neurological: He is alert and oriented to person, place, and time. No cranial nerve deficit. Coordination normal.  Skin: Skin is warm and dry. No rash noted. He is not diaphoretic. No erythema. No pallor.  Psychiatric: He has a normal mood and affect. His behavior is normal. Judgment and thought content normal.          Assessment & Plan:

## 2011-05-06 NOTE — Assessment & Plan Note (Signed)
Symptoms and exam are consistent with muscle spasm in the lower back after overexertion. Will add Flexeril to see if any improvement in symptoms. Patient has already established and is continuing with chiropractic care. Patient will call if symptoms are not improving over the next 72 hours.

## 2011-05-16 ENCOUNTER — Telehealth: Payer: Self-pay | Admitting: Internal Medicine

## 2011-05-16 NOTE — Telephone Encounter (Signed)
Pt came in today just to let you know the muscle relaxiers and pain meds are not helping his back. He will be here Monday to see you

## 2011-05-19 ENCOUNTER — Encounter: Payer: Self-pay | Admitting: Internal Medicine

## 2011-05-19 ENCOUNTER — Ambulatory Visit (INDEPENDENT_AMBULATORY_CARE_PROVIDER_SITE_OTHER): Payer: Medicare Other | Admitting: Internal Medicine

## 2011-05-19 ENCOUNTER — Ambulatory Visit (INDEPENDENT_AMBULATORY_CARE_PROVIDER_SITE_OTHER)
Admission: RE | Admit: 2011-05-19 | Discharge: 2011-05-19 | Disposition: A | Discharge status: Home / Self Care | Payer: Medicare Other | Source: Ambulatory Visit | Attending: Internal Medicine | Admitting: Internal Medicine

## 2011-05-19 VITALS — BP 144/90 | HR 88 | Ht 66.5 in | Wt 195.0 lb

## 2011-05-19 DIAGNOSIS — M545 Low back pain, unspecified: Secondary | ICD-10-CM

## 2011-05-19 DIAGNOSIS — I959 Hypotension, unspecified: Secondary | ICD-10-CM

## 2011-05-19 MED ORDER — LIDOCAINE 5 % EX PTCH: 1.0000 | MEDICATED_PATCH | CUTANEOUS | Status: AC

## 2011-05-19 MED ORDER — FLUDROCORTISONE ACETATE 0.1 MG PO TABS: 0.0500 mg | ORAL_TABLET | Freq: Every day | ORAL | Status: DC

## 2011-05-19 NOTE — Assessment & Plan Note (Signed)
Pain seems more focal over the bony lumbar spine. Will get plain x-ray for eval. Patient was seen in the past at the pain clinic and had steroid injections with some improvement. Will set up referral. Will try applying Lidoderm patch to see if any improvement in symptoms.

## 2011-05-19 NOTE — Progress Notes (Signed)
Subjective:    Patient ID: Johnny Sanford, male    DOB: [birth year], 71 y.o.   MRN: [id number]  HPI 71 year old male with history of chronic low back pain status post steroid injections in the distant past presents for acute visit complaining of severe lower back pain. Approximately 3 weeks ago he was pushing a lawnmower and developed sudden onset of worsening back pain. He was seen in clinic and was started on muscle relaxers. He reports no improvement with this. He notes that his pain has gotten progressively worse over the last few days. He is having difficulty sleeping. The pain is described as focal and severe in his mid lower back. It radiates to both hips. It is not associated with weakness in his legs. He has not had incontinence of stool or urine. He has been using muscle relaxers including Flexeril and narcotics including Vicodin with no improvement.  Outpatient Encounter Prescriptions as of 05/19/2011  Medication Sig Dispense Refill  . ALPRAZolam (XANAX) 0.5 MG tablet Take 1 tablet (0.5 mg total) by mouth 3 (three) times daily as needed.  90 tablet  3  . aspirin 81 MG EC tablet Take 162 mg by mouth daily.       . budesonide-formoterol (SYMBICORT) 160-4.5 MCG/ACT inhaler Inhale 2 puffs into the lungs 2 (two) times daily.  1 Inhaler  3  . Calcium Carb-Cholecalciferol (CALCIUM 1000 + D PO) Take 1 tablet by mouth daily.        . cyclobenzaprine (FLEXERIL) 5 MG tablet 1-2 tablets up to three times daily as needed  90 tablet  1  . fludrocortisone (FLORINEF) 0.1 MG tablet Take 0.5 tablets (0.05 mg total) by mouth daily.  90 tablet  3  . fluticasone (FLONASE) 50 MCG/ACT nasal spray Place 1 spray into the nose daily as needed.      . hydroxyurea (HYDREA) 500 MG capsule Take 500 mg by mouth as directed. May take with food to minimize GI side effects.       Marland Kitchen levothyroxine (SYNTHROID, LEVOTHROID) 137 MCG tablet Take 1 tablet (137 mcg total) by mouth daily.  90 tablet  1  . OMEGA-3 1000 MG CAPS Take 1  capsule by mouth daily.        Marland Kitchen omeprazole (PRILOSEC) 20 MG capsule Take 1 capsule (20 mg total) by mouth daily.  90 capsule  1  . simvastatin (ZOCOR) 20 MG tablet Take 1 tablet (20 mg total) by mouth daily.  90 tablet  1  . traZODone (DESYREL) 50 MG tablet Take 0.5-1 tablets (25-50 mg total) by mouth at bedtime as needed for sleep.  30 tablet  3  . DISCONTD: FLUDROCORTISONE ACETATE PO Take 0.5 tablets by mouth daily. 0.1 mg       . lidocaine (LIDODERM) 5 % Place 1 patch onto the skin daily. Remove & Discard patch within 12 hours or as directed by MD  30 patch  0  . DISCONTD: traZODone (DESYREL) 50 MG tablet Take 50 mg by mouth at bedtime as needed.        Review of Systems  Constitutional: Negative for fever, chills, diaphoresis and fatigue.  Respiratory: Negative for cough and shortness of breath.   Gastrointestinal: Negative for abdominal pain.  Genitourinary: Negative for frequency and flank pain.  Musculoskeletal: Positive for myalgias, back pain and arthralgias.  Neurological: Negative for weakness.   BP 144/90  Pulse 88  Ht 5' 6.5" (1.689 m)  Wt 195 lb (88.451 kg)  BMI 31.00 kg/m2  Objective:   Physical Exam  Constitutional: He is oriented to person, place, and time. He appears well-developed and well-nourished. No distress.  HENT:  Head: Normocephalic and atraumatic.  Right Ear: External ear normal.  Left Ear: External ear normal.  Nose: Nose normal.  Mouth/Throat: Oropharynx is clear and moist. No oropharyngeal exudate.  Eyes: Conjunctivae and EOM are normal. Pupils are equal, round, and reactive to light. Right eye exhibits no discharge. Left eye exhibits no discharge. No scleral icterus.  Neck: Normal range of motion. Neck supple. No tracheal deviation present. No thyromegaly present.  Pulmonary/Chest: Effort normal.  Musculoskeletal: He exhibits no edema.       Lumbar back: He exhibits decreased range of motion, tenderness, bony tenderness, pain and spasm.    Lymphadenopathy:    He has no cervical adenopathy.  Neurological: He is alert and oriented to person, place, and time. No cranial nerve deficit. Coordination normal.  Skin: Skin is warm and dry. No rash noted. He is not diaphoretic. No erythema. No pallor.  Psychiatric: He has a normal mood and affect. His behavior is normal. Judgment and thought content normal.          Assessment & Plan:

## 2011-05-23 ENCOUNTER — Encounter: Payer: Self-pay | Admitting: Internal Medicine

## 2011-05-23 DIAGNOSIS — M549 Dorsalgia, unspecified: Secondary | ICD-10-CM

## 2011-05-29 ENCOUNTER — Other Ambulatory Visit: Payer: Self-pay | Admitting: *Deleted

## 2011-05-29 DIAGNOSIS — F419 Anxiety disorder, unspecified: Secondary | ICD-10-CM

## 2011-05-29 MED ORDER — ALPRAZOLAM 0.5 MG PO TABS: 0.5000 mg | ORAL_TABLET | Freq: Three times a day (TID) | ORAL | Status: DC | PRN

## 2011-05-29 NOTE — Telephone Encounter (Signed)
Request for Alprazolam [EMR showed 04.09.13 printed script #[id number]]; verified w/pharmacist [Paige] not received, gave VO/SLS

## 2011-06-07 ENCOUNTER — Emergency Department: Payer: Self-pay | Admitting: Emergency Medicine

## 2011-06-08 LAB — URINALYSIS, COMPLETE
Bacteria: NONE SEEN
Bilirubin,UR: NEGATIVE
Blood: NEGATIVE
Glucose,UR: NEGATIVE mg/dL (ref 0–75)
Ketone: NEGATIVE
Leukocyte Esterase: NEGATIVE
Nitrite: NEGATIVE
Ph: 6 (ref 4.5–8.0)
Specific Gravity: 1.018 (ref 1.003–1.030)
WBC UR: NONE SEEN /HPF (ref 0–5)

## 2011-06-08 LAB — CBC WITH DIFFERENTIAL/PLATELET
Basophil #: 0 10*3/uL (ref 0.0–0.1)
HGB: 11.4 g/dL — ABNORMAL LOW (ref 13.0–18.0)
Lymphocyte #: 0.2 10*3/uL — ABNORMAL LOW (ref 1.0–3.6)
MCHC: 35.1 g/dL (ref 32.0–36.0)
MCV: 126 fL — ABNORMAL HIGH (ref 80–100)
Monocyte #: 0.4 x10 3/mm (ref 0.2–1.0)
Neutrophil #: 2.4 10*3/uL (ref 1.4–6.5)
Neutrophil %: 80.2 %
RBC: 2.58 10*6/uL — ABNORMAL LOW (ref 4.40–5.90)
RDW: 13.6 % (ref 11.5–14.5)
WBC: 3 10*3/uL — ABNORMAL LOW (ref 3.8–10.6)

## 2011-06-08 LAB — COMPREHENSIVE METABOLIC PANEL
Albumin: 3.8 g/dL (ref 3.4–5.0)
BUN: 14 mg/dL (ref 7–18)
Bilirubin,Total: 0.5 mg/dL (ref 0.2–1.0)
Calcium, Total: 8.3 mg/dL — ABNORMAL LOW (ref 8.5–10.1)
Chloride: 94 mmol/L — ABNORMAL LOW (ref 98–107)
Creatinine: 0.83 mg/dL (ref 0.60–1.30)
Glucose: 143 mg/dL — ABNORMAL HIGH (ref 65–99)
SGOT(AST): 28 U/L (ref 15–37)
SGPT (ALT): 25 U/L
Total Protein: 7.4 g/dL (ref 6.4–8.2)

## 2011-06-08 LAB — TROPONIN I: Troponin-I: <0.02 ng/mL

## 2011-06-09 ENCOUNTER — Encounter: Payer: Self-pay | Admitting: Internal Medicine

## 2011-06-09 ENCOUNTER — Ambulatory Visit (INDEPENDENT_AMBULATORY_CARE_PROVIDER_SITE_OTHER): Payer: Medicare Other | Admitting: Internal Medicine

## 2011-06-09 VITALS — BP 142/96 | HR 82 | Temp 98.0°F | Resp 18 | Wt 187.0 lb

## 2011-06-09 DIAGNOSIS — J189 Pneumonia, unspecified organism: Secondary | ICD-10-CM

## 2011-06-09 DIAGNOSIS — M545 Low back pain: Secondary | ICD-10-CM

## 2011-06-09 LAB — URINE CULTURE

## 2011-06-09 NOTE — Progress Notes (Signed)
Subjective:    Patient ID: Johnny Sanford, male    DOB: 12/26/1940, 71 y.o.   MRN: [id number]  HPI 71 year old male with history of hypertension and rheumatoid arthritis presents for followup after recent ER evaluation in which he was found to have pneumonia. He reports that he was feeling well until Saturday morning when he developed some fatigue, chills, and sweats. Symptoms got progressively worse and he had an episode in which he stood up and nearly passed out. His wife took him to the ER where he had chest x-ray which apparently showed left-sided pneumonia. He was started on doxycycline. He reports no further episodes of chills or sweats since that time. He reports that energy level is improving. He denies any fever. He continues to have some nonproductive cough.  Outpatient Encounter Prescriptions as of 06/09/2011  Medication Sig Dispense Refill  . ALPRAZolam (XANAX) 0.5 MG tablet Take 1 tablet (0.5 mg total) by mouth 3 (three) times daily as needed.  90 tablet  3  . aspirin 81 MG EC tablet Take 162 mg by mouth daily.       . budesonide-formoterol (SYMBICORT) 160-4.5 MCG/ACT inhaler Inhale 2 puffs into the lungs 2 (two) times daily.  1 Inhaler  3  . Calcium Carb-Cholecalciferol (CALCIUM 1000 + D PO) Take 1 tablet by mouth daily.        . cyclobenzaprine (FLEXERIL) 5 MG tablet 1-2 tablets up to three times daily as needed  90 tablet  1  . doxycycline (VIBRAMYCIN) 100 MG capsule Take 100 mg by mouth 2 (two) times daily.      . fludrocortisone (FLORINEF) 0.1 MG tablet Take 0.5 tablets (0.05 mg total) by mouth daily.  90 tablet  3  . fluticasone (FLONASE) 50 MCG/ACT nasal spray Place 1 spray into the nose daily as needed.      . hydroxyurea (HYDREA) 500 MG capsule Take 500 mg by mouth as directed. May take with food to minimize GI side effects.       Marland Kitchen levothyroxine (SYNTHROID, LEVOTHROID) 137 MCG tablet Take 1 tablet (137 mcg total) by mouth daily.  90 tablet  1  . lidocaine (LIDODERM) 5 % Place 1  patch onto the skin daily. Remove & Discard patch within 12 hours or as directed by MD  30 patch  0  . OMEGA-3 1000 MG CAPS Take 1 capsule by mouth daily.        Marland Kitchen omeprazole (PRILOSEC) 20 MG capsule Take 1 capsule (20 mg total) by mouth daily.  90 capsule  1  . simvastatin (ZOCOR) 20 MG tablet Take 1 tablet (20 mg total) by mouth daily.  90 tablet  1  . traZODone (DESYREL) 50 MG tablet Take 0.5-1 tablets (25-50 mg total) by mouth at bedtime as needed for sleep.  30 tablet  3   BP 142/96  Pulse 82  Temp(Src) 98 F (36.7 C) (Oral)  Resp 18  Wt 187 lb (84.823 kg)  SpO2 94%  Review of Systems  Constitutional: Positive for chills, diaphoresis and fatigue. Negative for fever, activity change, appetite change and unexpected weight change.  Eyes: Negative for visual disturbance.  Respiratory: Positive for cough. Negative for chest tightness, shortness of breath and wheezing.   Cardiovascular: Negative for chest pain, palpitations and leg swelling.  Gastrointestinal: Negative for abdominal pain and abdominal distention.  Genitourinary: Negative for dysuria, urgency and difficulty urinating.  Musculoskeletal: Negative for arthralgias and gait problem.  Skin: Negative for color change and rash.  Hematological: Negative  for adenopathy.  Psychiatric/Behavioral: Negative for sleep disturbance and dysphoric mood. The patient is not nervous/anxious.        Objective:   Physical Exam  Constitutional: He is oriented to person, place, and time. He appears well-developed and well-nourished. No distress.  HENT:  Head: Normocephalic and atraumatic.  Right Ear: External ear normal.  Left Ear: External ear normal.  Nose: Nose normal.  Mouth/Throat: Oropharynx is clear and moist. No oropharyngeal exudate.  Eyes: Conjunctivae and EOM are normal. Pupils are equal, round, and reactive to light. Right eye exhibits no discharge. Left eye exhibits no discharge. No scleral icterus.  Neck: Normal range of  motion. Neck supple. No tracheal deviation present. No thyromegaly present.  Cardiovascular: Normal rate, regular rhythm and normal heart sounds.  Exam reveals no gallop and no friction rub.   No murmur heard. Pulmonary/Chest: Effort normal. No accessory muscle usage. Not tachypneic. No respiratory distress. He has no decreased breath sounds. He has no wheezes. He has rhonchi in the right upper field, the right middle field, the right lower field and the left lower field. He has no rales. He exhibits no tenderness.  Musculoskeletal: Normal range of motion. He exhibits no edema.  Lymphadenopathy:    He has no cervical adenopathy.  Neurological: He is alert and oriented to person, place, and time. No cranial nerve deficit. Coordination normal.  Skin: Skin is warm and dry. No rash noted. He is not diaphoretic. No erythema. No pallor.  Psychiatric: He has a normal mood and affect. His behavior is normal. Judgment and thought content normal.          Assessment & Plan:

## 2011-06-09 NOTE — Assessment & Plan Note (Signed)
Will forward records on recent imaging to his orthopedic physician.

## 2011-06-09 NOTE — Assessment & Plan Note (Signed)
Recently diagnosed with right-sided pneumonia. Will request records from [facility] on this. Will continue doxycycline. Patient will call if any recurrent fever, chills, or malaise. He will followup in 4 days.

## 2011-06-10 ENCOUNTER — Telehealth: Payer: Self-pay | Admitting: Internal Medicine

## 2011-06-10 ENCOUNTER — Encounter: Payer: Self-pay | Admitting: Internal Medicine

## 2011-06-10 DIAGNOSIS — D649 Anemia, unspecified: Secondary | ICD-10-CM

## 2011-06-10 NOTE — Telephone Encounter (Signed)
Labs from hospital showed Hgb 11 with MCV of 126. Needs repeat.

## 2011-06-13 ENCOUNTER — Encounter: Payer: Self-pay | Admitting: Internal Medicine

## 2011-06-13 ENCOUNTER — Ambulatory Visit (INDEPENDENT_AMBULATORY_CARE_PROVIDER_SITE_OTHER): Payer: Medicare Other | Admitting: Internal Medicine

## 2011-06-13 VITALS — BP 153/96 | HR 79 | Temp 97.8°F | Resp 18 | Wt 188.5 lb

## 2011-06-13 DIAGNOSIS — I10 Essential (primary) hypertension: Secondary | ICD-10-CM

## 2011-06-13 DIAGNOSIS — D473 Essential (hemorrhagic) thrombocythemia: Secondary | ICD-10-CM

## 2011-06-13 DIAGNOSIS — J189 Pneumonia, unspecified organism: Secondary | ICD-10-CM

## 2011-06-13 LAB — CULTURE, BLOOD (SINGLE)

## 2011-06-13 MED ORDER — HYDRALAZINE HCL 25 MG PO TABS: 25.0000 mg | ORAL_TABLET | Freq: Three times a day (TID) | ORAL | Status: DC

## 2011-06-13 NOTE — Progress Notes (Signed)
Subjective:    Patient ID: Johnny Sanford, male    DOB: July 28, 1940, 71 y.o.   MRN: [id number]  HPI 71 year old male with history of hypertension presents for followup after recent episode of pneumonia. He reports that his symptoms of cough and shortness of breath are improving. He has not had any further fever or chills. He continues to occasionally have cough productive of yellow sputum. He denies chest pain. He reports that energy level is improving.  He was noted on lab work to have leukopenia. He reports that this is chronic and is followed by his hematologist. He has been on hydroxyurea for essential thrombocythemia. He reports that his typical white blood cell count is around 3000.  Outpatient Encounter Prescriptions as of 06/13/2011  Medication Sig Dispense Refill  . ALPRAZolam (XANAX) 0.5 MG tablet Take 1 tablet (0.5 mg total) by mouth 3 (three) times daily as needed.  90 tablet  3  . aspirin 81 MG EC tablet Take 162 mg by mouth daily.       . budesonide-formoterol (SYMBICORT) 160-4.5 MCG/ACT inhaler Inhale 2 puffs into the lungs 2 (two) times daily.  1 Inhaler  3  . Calcium Carb-Cholecalciferol (CALCIUM 1000 + D PO) Take 1 tablet by mouth daily.        . cyclobenzaprine (FLEXERIL) 5 MG tablet 1-2 tablets up to three times daily as needed  90 tablet  1  . doxycycline (VIBRAMYCIN) 100 MG capsule Take 100 mg by mouth 2 (two) times daily.      . fludrocortisone (FLORINEF) 0.1 MG tablet Take 0.5 tablets (0.05 mg total) by mouth daily.  90 tablet  3  . fluticasone (FLONASE) 50 MCG/ACT nasal spray Place 1 spray into the nose daily as needed.      . hydroxyurea (HYDREA) 500 MG capsule Take 500 mg by mouth as directed. May take with food to minimize GI side effects.       Marland Kitchen levothyroxine (SYNTHROID, LEVOTHROID) 137 MCG tablet Take 1 tablet (137 mcg total) by mouth daily.  90 tablet  1  . lidocaine (LIDODERM) 5 % Place 1 patch onto the skin daily. Remove & Discard patch within 12 hours or as  directed by MD  30 patch  0  . OMEGA-3 1000 MG CAPS Take 1 capsule by mouth daily.        Marland Kitchen omeprazole (PRILOSEC) 20 MG capsule Take 1 capsule (20 mg total) by mouth daily.  90 capsule  1  . simvastatin (ZOCOR) 20 MG tablet Take 1 tablet (20 mg total) by mouth daily.  90 tablet  1  . traZODone (DESYREL) 50 MG tablet Take 0.5-1 tablets (25-50 mg total) by mouth at bedtime as needed for sleep.  30 tablet  3  . hydrALAZINE (APRESOLINE) 25 MG tablet Take 1 tablet (25 mg total) by mouth 3 (three) times daily. Use as needed for BP >160/100  90 tablet  3   BP 153/96  Pulse 79  Temp(Src) 97.8 F (36.6 C) (Oral)  Resp 18  Wt 188 lb 8 oz (85.503 kg)  SpO2 97%  Review of Systems  Constitutional: Positive for fatigue. Negative for fever, chills, activity change, appetite change and unexpected weight change.  Eyes: Negative for visual disturbance.  Respiratory: Positive for cough. Negative for shortness of breath.   Cardiovascular: Negative for chest pain, palpitations and leg swelling.  Gastrointestinal: Negative for abdominal pain and abdominal distention.  Genitourinary: Negative for dysuria, urgency and difficulty urinating.  Musculoskeletal: Negative for arthralgias and  gait problem.  Skin: Negative for color change and rash.  Hematological: Negative for adenopathy.  Psychiatric/Behavioral: Negative for sleep disturbance and dysphoric mood. The patient is not nervous/anxious.        Objective:   Physical Exam  Constitutional: He is oriented to person, place, and time. He appears well-developed and well-nourished. No distress.  HENT:  Head: Normocephalic and atraumatic.  Right Ear: External ear normal.  Left Ear: External ear normal.  Nose: Nose normal.  Mouth/Throat: Oropharynx is clear and moist. No oropharyngeal exudate.  Eyes: Conjunctivae and EOM are normal. Pupils are equal, round, and reactive to light. Right eye exhibits no discharge. Left eye exhibits no discharge. No scleral  icterus.  Neck: Normal range of motion. Neck supple. No tracheal deviation present. No thyromegaly present.  Cardiovascular: Normal rate, regular rhythm and normal heart sounds.  Exam reveals no gallop and no friction rub.   No murmur heard. Pulmonary/Chest: Effort normal. No accessory muscle usage. Not tachypneic. No respiratory distress. He has no wheezes. He has rhonchi in the right upper field, the right middle field and the right lower field. He has no rales. He exhibits no tenderness.  Musculoskeletal: Normal range of motion. He exhibits no edema.  Lymphadenopathy:    He has no cervical adenopathy.  Neurological: He is alert and oriented to person, place, and time. No cranial nerve deficit. Coordination normal.  Skin: Skin is warm and dry. No rash noted. He is not diaphoretic. No erythema. No pallor.  Psychiatric: He has a normal mood and affect. His behavior is normal. Judgment and thought content normal.          Assessment & Plan:

## 2011-06-13 NOTE — Assessment & Plan Note (Signed)
Symptoms are gradually improving. He will finish course of doxycycline. We will plan to repeat chest x-ray in 2 weeks. He will followup here in one month. He will call sooner if symptoms worsen, or if recurrent fever develops.

## 2011-06-13 NOTE — Progress Notes (Signed)
Addended by: Ronna Polio A on: 06/13/2011 09:51 PM   Modules accepted: Level of Service

## 2011-06-13 NOTE — Assessment & Plan Note (Signed)
On hydroxyurea. Will get records from his hematologist in regards to recent blood counts.

## 2011-06-24 ENCOUNTER — Ambulatory Visit (INDEPENDENT_AMBULATORY_CARE_PROVIDER_SITE_OTHER)
Admission: RE | Admit: 2011-06-24 | Discharge: 2011-06-24 | Disposition: A | Discharge status: Home / Self Care | Payer: Medicare Other | Source: Ambulatory Visit | Attending: Internal Medicine | Admitting: Internal Medicine

## 2011-06-24 DIAGNOSIS — J189 Pneumonia, unspecified organism: Secondary | ICD-10-CM

## 2011-07-01 ENCOUNTER — Ambulatory Visit: Payer: Self-pay | Admitting: Physical Medicine and Rehabilitation

## 2011-07-07 ENCOUNTER — Ambulatory Visit: Payer: Self-pay | Admitting: Oncology

## 2011-07-07 ENCOUNTER — Encounter: Payer: Self-pay | Admitting: Internal Medicine

## 2011-07-07 LAB — CBC CANCER CENTER
Basophil #: 0 x10 3/mm (ref 0.0–0.1)
Eosinophil #: 0 x10 3/mm (ref 0.0–0.7)
HCT: 32.9 % — ABNORMAL LOW (ref 40.0–52.0)
HGB: 11.4 g/dL — ABNORMAL LOW (ref 13.0–18.0)
Lymphocyte #: 0.6 x10 3/mm — ABNORMAL LOW (ref 1.0–3.6)
Lymphocyte %: 10.4 %
MCH: 44.5 pg — ABNORMAL HIGH (ref 26.0–34.0)
MCHC: 34.7 g/dL (ref 32.0–36.0)
MCV: 128 fL — ABNORMAL HIGH (ref 80–100)
Monocyte %: 16.5 %
Neutrophil #: 3.9 x10 3/mm (ref 1.4–6.5)
Neutrophil %: 72.7 %
Platelet: 389 x10 3/mm (ref 150–440)
RBC: 2.57 10*6/uL — ABNORMAL LOW (ref 4.40–5.90)
RDW: 14.7 % — ABNORMAL HIGH (ref 11.5–14.5)

## 2011-07-08 ENCOUNTER — Other Ambulatory Visit: Payer: Self-pay | Admitting: Internal Medicine

## 2011-07-08 ENCOUNTER — Other Ambulatory Visit (INDEPENDENT_AMBULATORY_CARE_PROVIDER_SITE_OTHER): Payer: Medicare Other | Admitting: *Deleted

## 2011-07-08 DIAGNOSIS — N390 Urinary tract infection, site not specified: Secondary | ICD-10-CM

## 2011-07-08 LAB — POCT URINALYSIS DIPSTICK
Protein, UA: 30
Spec Grav, UA: 1.01
Urobilinogen, UA: 0.2

## 2011-07-08 MED ORDER — CIPROFLOXACIN HCL 500 MG PO TABS: 500.0000 mg | ORAL_TABLET | Freq: Two times a day (BID) | ORAL | Status: AC

## 2011-07-17 ENCOUNTER — Encounter: Payer: Self-pay | Admitting: Internal Medicine

## 2011-07-17 ENCOUNTER — Ambulatory Visit (INDEPENDENT_AMBULATORY_CARE_PROVIDER_SITE_OTHER): Payer: Medicare Other | Admitting: Internal Medicine

## 2011-07-17 VITALS — BP 142/100 | HR 81 | Temp 98.7°F | Ht 66.5 in | Wt 190.8 lb

## 2011-07-17 DIAGNOSIS — N390 Urinary tract infection, site not specified: Secondary | ICD-10-CM | POA: Insufficient documentation

## 2011-07-17 DIAGNOSIS — M545 Low back pain: Secondary | ICD-10-CM

## 2011-07-17 DIAGNOSIS — I10 Essential (primary) hypertension: Secondary | ICD-10-CM

## 2011-07-17 NOTE — Assessment & Plan Note (Signed)
Blood pressure elevated today. Likely secondary to recent steroid injection. Will continue to monitor. Patient will use hydralazine as needed for blood pressures greater than 160/100.

## 2011-07-17 NOTE — Assessment & Plan Note (Signed)
Symptoms improved after initial epidural injection last week. We'll continue to monitor. Repeat injection is scheduled for next week.

## 2011-07-17 NOTE — Assessment & Plan Note (Signed)
Recent urinary tract infection. Symptomatically improved. Urine culture positive for Escherichia coli. Patient will complete 2 weeks of Cipro and then had a repeat urine culture next week.

## 2011-07-17 NOTE — Progress Notes (Signed)
Subjective:    Patient ID: Johnny Sanford, male    DOB: [birth year], 72 y.o.   MRN: [id number]  HPI 71 year old male with history of hypertension, chronic back pain, essential thrombocythemia, rheumatoid arthritis presents for followup. Last week, he underwent epidural steroid injection in his back to help with chronic back pain. He reports significant agitation as a result of steroid exposure. He has had some improvement in his back pain at this point. He is scheduled for repeat injection next week.  Regards to his hypertension, his wife reports it has been well-controlled with diastolic blood pressure readings no greater than 90 at home. He does have hydralazine in case of blood pressure greater than 160/100. He has not recently needed to use this.  Outpatient Encounter Prescriptions as of 07/17/2011  Medication Sig Dispense Refill  . ALPRAZolam (XANAX) 0.5 MG tablet Take 1 tablet (0.5 mg total) by mouth 3 (three) times daily as needed.  90 tablet  3  . aspirin 81 MG EC tablet Take 162 mg by mouth daily.       . budesonide-formoterol (SYMBICORT) 160-4.5 MCG/ACT inhaler Inhale 2 puffs into the lungs 2 (two) times daily.  1 Inhaler  3  . Calcium Carb-Cholecalciferol (CALCIUM 1000 + D PO) Take 1 tablet by mouth daily.        . ciprofloxacin (CIPRO) 500 MG tablet Take 1 tablet (500 mg total) by mouth 2 (two) times daily.  28 tablet  0  . cyclobenzaprine (FLEXERIL) 5 MG tablet 1-2 tablets up to three times daily as needed  90 tablet  1  . doxycycline (VIBRAMYCIN) 100 MG capsule Take 100 mg by mouth 2 (two) times daily.      . fludrocortisone (FLORINEF) 0.1 MG tablet Take 0.5 tablets (0.05 mg total) by mouth daily.  90 tablet  3  . fluticasone (FLONASE) 50 MCG/ACT nasal spray Place 1 spray into the nose daily as needed.      . hydrALAZINE (APRESOLINE) 25 MG tablet Take 1 tablet (25 mg total) by mouth 3 (three) times daily. Use as needed for BP >160/100  90 tablet  3  . hydroxyurea (HYDREA) 500 MG  capsule Take 500 mg by mouth as directed. May take with food to minimize GI side effects.       Marland Kitchen levothyroxine (SYNTHROID, LEVOTHROID) 137 MCG tablet Take 1 tablet (137 mcg total) by mouth daily.  90 tablet  1  . OMEGA-3 1000 MG CAPS Take 1 capsule by mouth daily.        Marland Kitchen omeprazole (PRILOSEC) 20 MG capsule Take 1 capsule (20 mg total) by mouth daily.  90 capsule  1  . simvastatin (ZOCOR) 20 MG tablet Take 1 tablet (20 mg total) by mouth daily.  90 tablet  1  . traZODone (DESYREL) 50 MG tablet Take 0.5-1 tablets (25-50 mg total) by mouth at bedtime as needed for sleep.  30 tablet  3    Review of Systems  Constitutional: Negative for fever, chills, activity change, appetite change, fatigue and unexpected weight change.  Eyes: Negative for visual disturbance.  Respiratory: Negative for cough and shortness of breath.   Cardiovascular: Negative for chest pain, palpitations and leg swelling.  Gastrointestinal: Negative for abdominal pain and abdominal distention.  Genitourinary: Negative for dysuria, urgency and difficulty urinating.  Musculoskeletal: Positive for myalgias, back pain and arthralgias. Negative for gait problem.  Skin: Negative for color change and rash.  Hematological: Negative for adenopathy.  Psychiatric/Behavioral: Negative for disturbed wake/sleep cycle and dysphoric  mood. The patient is not nervous/anxious.    BP 142/100  Pulse 81  Temp 98.7 F (37.1 C) (Oral)  Ht 5' 6.5" (1.689 m)  Wt 190 lb 12 oz (86.524 kg)  BMI 30.33 kg/m2  SpO2 97%     Objective:   Physical Exam  Constitutional: He is oriented to person, place, and time. He appears well-developed and well-nourished. No distress.  HENT:  Head: Normocephalic and atraumatic.  Right Ear: External ear normal.  Left Ear: External ear normal.  Nose: Nose normal.  Mouth/Throat: Oropharynx is clear and moist. No oropharyngeal exudate.  Eyes: Conjunctivae and EOM are normal. Pupils are equal, round, and reactive to  light. Right eye exhibits no discharge. Left eye exhibits no discharge. No scleral icterus.  Neck: Normal range of motion. Neck supple. No tracheal deviation present. No thyromegaly present.  Cardiovascular: Normal rate, regular rhythm and normal heart sounds.  Exam reveals no gallop and no friction rub.   No murmur heard. Pulmonary/Chest: Effort normal and breath sounds normal. No respiratory distress. He has no wheezes. He has no rales. He exhibits no tenderness.  Musculoskeletal: Normal range of motion. He exhibits no edema.  Lymphadenopathy:    He has no cervical adenopathy.  Neurological: He is alert and oriented to person, place, and time. No cranial nerve deficit. Coordination normal.  Skin: Skin is warm and dry. No rash noted. He is not diaphoretic. No erythema. No pallor.  Psychiatric: He has a normal mood and affect. His behavior is normal. Judgment and thought content normal.          Assessment & Plan:

## 2011-07-21 ENCOUNTER — Other Ambulatory Visit: Payer: Self-pay | Admitting: *Deleted

## 2011-07-21 MED ORDER — LEVOTHYROXINE SODIUM 137 MCG PO TABS: 137.0000 ug | ORAL_TABLET | Freq: Every day | ORAL | Status: DC

## 2011-07-25 ENCOUNTER — Other Ambulatory Visit (INDEPENDENT_AMBULATORY_CARE_PROVIDER_SITE_OTHER): Payer: Medicare Other | Admitting: *Deleted

## 2011-07-25 DIAGNOSIS — N390 Urinary tract infection, site not specified: Secondary | ICD-10-CM

## 2011-07-28 ENCOUNTER — Ambulatory Visit: Payer: Self-pay | Admitting: Oncology

## 2011-07-29 ENCOUNTER — Other Ambulatory Visit: Payer: Self-pay | Admitting: *Deleted

## 2011-07-29 MED ORDER — OMEPRAZOLE 20 MG PO CPDR: 20.0000 mg | DELAYED_RELEASE_CAPSULE | Freq: Every day | ORAL | Status: DC

## 2011-08-14 ENCOUNTER — Encounter: Payer: Self-pay | Admitting: Internal Medicine

## 2011-08-14 ENCOUNTER — Ambulatory Visit (INDEPENDENT_AMBULATORY_CARE_PROVIDER_SITE_OTHER): Payer: Medicare Other | Admitting: Internal Medicine

## 2011-08-14 VITALS — BP 140/102 | HR 77 | Temp 98.1°F | Ht 66.5 in | Wt 189.2 lb

## 2011-08-14 DIAGNOSIS — I10 Essential (primary) hypertension: Secondary | ICD-10-CM

## 2011-08-14 DIAGNOSIS — M545 Low back pain: Secondary | ICD-10-CM

## 2011-08-14 NOTE — Assessment & Plan Note (Signed)
Symptoms improved after second epidural steroid injection. Will continue to monitor.

## 2011-08-14 NOTE — Assessment & Plan Note (Signed)
Labile hypertension unchanged. Will continue as needed hydralazine for blood pressures greater than 160/100.

## 2011-08-14 NOTE — Progress Notes (Signed)
Subjective:    Patient ID: Johnny Sanford, male    DOB: [birth year], 71 y.o.   MRN: [id number]  HPI 71 year old male with history of hypertension and chronic low back pain presents for followup. In regards to his hypertension, he reports that blood pressures continue to be quite labile. He occasionally has blood pressures around 140/100, but then will have occasional low blood pressures symptoms less than 80/40. This is a chronic issue for him. If blood pressure is consistently greater than 140/100 he will occasionally take hydralazine. He denies any headache, chest pain, shortness of breath.  In regards to his chronic low back pain, he reports that he had a second epidural steroid injection. He reports that pain is improved after the second injection. However, he continues to have some mild discomfort. He is planning to have one more injection before the end of the year.  Outpatient Encounter Prescriptions as of 08/14/2011  Medication Sig Dispense Refill  . ALPRAZolam (XANAX) 0.5 MG tablet Take 1 tablet (0.5 mg total) by mouth 3 (three) times daily as needed.  90 tablet  3  . aspirin 81 MG EC tablet Take 162 mg by mouth daily.       . budesonide-formoterol (SYMBICORT) 160-4.5 MCG/ACT inhaler Inhale 2 puffs into the lungs 2 (two) times daily.  1 Inhaler  3  . Calcium Carb-Cholecalciferol (CALCIUM 1000 + D PO) Take 1 tablet by mouth daily.        . fludrocortisone (FLORINEF) 0.1 MG tablet Take 0.5 tablets (0.05 mg total) by mouth daily.  90 tablet  3  . fluticasone (FLONASE) 50 MCG/ACT nasal spray Place 1 spray into the nose daily as needed.      . hydrALAZINE (APRESOLINE) 25 MG tablet Take 1 tablet (25 mg total) by mouth 3 (three) times daily. Use as needed for BP >160/100  90 tablet  3  . hydroxyurea (HYDREA) 500 MG capsule Take 500 mg by mouth as directed. May take with food to minimize GI side effects.       Marland Kitchen levothyroxine (SYNTHROID, LEVOTHROID) 137 MCG tablet Take 1 tablet (137 mcg total) by  mouth daily.  90 tablet  3  . OMEGA-3 1000 MG CAPS Take 1 capsule by mouth daily.        Marland Kitchen omeprazole (PRILOSEC) 20 MG capsule Take 1 capsule (20 mg total) by mouth daily.  90 capsule  3  . simvastatin (ZOCOR) 20 MG tablet Take 1 tablet (20 mg total) by mouth daily.  90 tablet  1  . traZODone (DESYREL) 50 MG tablet Take 0.5-1 tablets (25-50 mg total) by mouth at bedtime as needed for sleep.  30 tablet  3  . DISCONTD: cyclobenzaprine (FLEXERIL) 5 MG tablet 1-2 tablets up to three times daily as needed  90 tablet  1  . DISCONTD: doxycycline (VIBRAMYCIN) 100 MG capsule Take 100 mg by mouth 2 (two) times daily.      Marland Kitchen gabapentin (NEURONTIN) 300 MG capsule        BP 140/102  Pulse 77  Temp 98.1 F (36.7 C) (Oral)  Ht 5' 6.5" (1.689 m)  Wt 189 lb 4 oz (85.843 kg)  BMI 30.09 kg/m2  SpO2 97%  Review of Systems  Constitutional: Negative for fever, chills, activity change, appetite change, fatigue and unexpected weight change.  Eyes: Negative for visual disturbance.  Respiratory: Negative for cough and shortness of breath.   Cardiovascular: Negative for chest pain, palpitations and leg swelling.  Gastrointestinal: Negative for abdominal pain and  abdominal distention.  Genitourinary: Negative for dysuria, urgency and difficulty urinating.  Musculoskeletal: Positive for myalgias, back pain and arthralgias. Negative for gait problem.  Skin: Negative for color change and rash.  Hematological: Negative for adenopathy.  Psychiatric/Behavioral: Negative for disturbed wake/sleep cycle and dysphoric mood. The patient is not nervous/anxious.        Objective:   Physical Exam  Constitutional: He is oriented to person, place, and time. He appears well-developed and well-nourished. No distress.  HENT:  Head: Normocephalic and atraumatic.  Right Ear: External ear normal.  Left Ear: External ear normal.  Nose: Nose normal.  Mouth/Throat: Oropharynx is clear and moist. No oropharyngeal exudate.  Eyes:  Conjunctivae and EOM are normal. Pupils are equal, round, and reactive to light. Right eye exhibits no discharge. Left eye exhibits no discharge. No scleral icterus.  Neck: Normal range of motion. Neck supple. No tracheal deviation present. No thyromegaly present.  Cardiovascular: Normal rate, regular rhythm and normal heart sounds.  Exam reveals no gallop and no friction rub.   No murmur heard. Pulmonary/Chest: Effort normal and breath sounds normal. No respiratory distress. He has no wheezes. He has no rales. He exhibits no tenderness.  Musculoskeletal: He exhibits no edema.       Lumbar back: He exhibits decreased range of motion and pain.  Lymphadenopathy:    He has no cervical adenopathy.  Neurological: He is alert and oriented to person, place, and time. No cranial nerve deficit. Coordination normal.  Skin: Skin is warm and dry. No rash noted. He is not diaphoretic. No erythema. No pallor.  Psychiatric: He has a normal mood and affect. His behavior is normal. Judgment and thought content normal.          Assessment & Plan:

## 2011-09-23 ENCOUNTER — Other Ambulatory Visit: Payer: Self-pay | Admitting: *Deleted

## 2011-09-23 DIAGNOSIS — G4700 Insomnia, unspecified: Secondary | ICD-10-CM

## 2011-09-23 MED ORDER — TRAZODONE HCL 50 MG PO TABS: 25.0000 mg | ORAL_TABLET | Freq: Every evening | ORAL | Status: DC | PRN

## 2011-10-08 ENCOUNTER — Ambulatory Visit: Payer: Self-pay | Admitting: Oncology

## 2011-10-08 LAB — CBC CANCER CENTER
Basophil #: 0 x10 3/mm (ref 0.0–0.1)
Basophil %: 0.6 %
Eosinophil #: 0 x10 3/mm (ref 0.0–0.7)
Eosinophil %: 1.2 %
HGB: 11.7 g/dL — ABNORMAL LOW (ref 13.0–18.0)
Lymphocyte %: 17.5 %
Monocyte #: 0.6 x10 3/mm (ref 0.2–1.0)
Monocyte %: 16.1 %
Neutrophil %: 64.6 %
RBC: 2.8 10*6/uL — ABNORMAL LOW (ref 4.40–5.90)
WBC: 3.8 x10 3/mm (ref 3.8–10.6)

## 2011-10-10 ENCOUNTER — Ambulatory Visit (INDEPENDENT_AMBULATORY_CARE_PROVIDER_SITE_OTHER): Payer: Medicare Other | Admitting: Cardiovascular Disease

## 2011-10-10 ENCOUNTER — Encounter: Payer: Self-pay | Admitting: Cardiovascular Disease

## 2011-10-10 VITALS — BP 110/78 | HR 72 | Ht 66.5 in | Wt 186.5 lb

## 2011-10-10 DIAGNOSIS — R42 Dizziness and giddiness: Secondary | ICD-10-CM

## 2011-10-10 DIAGNOSIS — E785 Hyperlipidemia, unspecified: Secondary | ICD-10-CM

## 2011-10-10 DIAGNOSIS — I6529 Occlusion and stenosis of unspecified carotid artery: Secondary | ICD-10-CM

## 2011-10-10 DIAGNOSIS — J449 Chronic obstructive pulmonary disease, unspecified: Secondary | ICD-10-CM

## 2011-10-10 DIAGNOSIS — I10 Essential (primary) hypertension: Secondary | ICD-10-CM

## 2011-10-10 NOTE — Progress Notes (Signed)
Patient ID: Johnny Sanford, male    DOB: May 29, 1940, 71 y.o.   MRN: [id number]  HPI Comments: 71 year old gentleman, patient of Dr. Ronna Polio, with history of GERD, CVA, carotid arterial disease with a carotid endarterectomy on the left in 2003 with 60% disease on the right, hypothyroidism, very labile blood pressures, Started on Florinef by a [facility] physician, history of throat cancer with radiation , who presents for routine followup   He reports that he has been doing well recently. His blood pressure is still labile though he Believes it has smooth out on the Florinef.  On average, he has systolic pressures in the 130s. Rarely it goes to 160. He has rare episodes of dizziness or symptoms like he feels he is going to pass out. He is relatively active. Otherwise has no new complaints.   Carotid ultrasound showed no significant disease on the left, 60% blockage on the right He was told that he has spinal stenosis  EKG shows normal sinus rhythm with rate 72 beats per minute with no significant ST or T wave changes     Outpatient Encounter Prescriptions as of 10/10/2011  Medication Sig Dispense Refill  . ALPRAZolam (XANAX) 0.5 MG tablet Take 0.5 mg by mouth as needed.      Marland Kitchen aspirin 81 MG EC tablet Take 162 mg by mouth daily.       . budesonide-formoterol (SYMBICORT) 160-4.5 MCG/ACT inhaler Inhale 2 puffs into the lungs 2 (two) times daily.  1 Inhaler  3  . Calcium Carb-Cholecalciferol (CALCIUM 1000 + D PO) Take 1 tablet by mouth daily.        . Cyanocobalamin (VITAMIN B-12 PO) Take by mouth daily.      . fludrocortisone (FLORINEF) 0.1 MG tablet Take 0.5 tablets (0.05 mg total) by mouth daily.  90 tablet  3  . fluorouracil (EFUDEX) 5 % cream Apply topically as needed.      . fluticasone (FLONASE) 50 MCG/ACT nasal spray Place 1 spray into the nose daily as needed.      . gabapentin (NEURONTIN) 300 MG capsule daily.       . hydrALAZINE (APRESOLINE) 25 MG tablet Take 1 tablet (25 mg total) by  mouth 3 (three) times daily. Use as needed for BP >160/100  90 tablet  3  . hydroxyurea (HYDREA) 500 MG capsule Take 500 mg by mouth as directed. May take with food to minimize GI side effects.       Marland Kitchen levothyroxine (SYNTHROID, LEVOTHROID) 137 MCG tablet Take 1 tablet (137 mcg total) by mouth daily.  90 tablet  3  . OMEGA-3 1000 MG CAPS Take 1 capsule by mouth daily.        Marland Kitchen omeprazole (PRILOSEC) 20 MG capsule Take 1 capsule (20 mg total) by mouth daily.  90 capsule  3  . simvastatin (ZOCOR) 20 MG tablet Take 1 tablet (20 mg total) by mouth daily.  90 tablet  1  . traZODone (DESYREL) 50 MG tablet Take 0.5-1 tablets (25-50 mg total) by mouth at bedtime as needed for sleep.  30 tablet  3  . DISCONTD: ALPRAZolam (XANAX) 0.5 MG tablet Take 1 tablet (0.5 mg total) by mouth 3 (three) times daily as needed.  90 tablet  3     Review of Systems  Constitutional: Negative.   HENT: Negative.   Eyes: Negative.   Respiratory: Negative.   Cardiovascular: Negative.   Gastrointestinal: Negative.   Musculoskeletal: Negative.   Skin: Negative.   Neurological: Positive  for dizziness.  Hematological: Negative.   Psychiatric/Behavioral: Negative.   All other systems reviewed and are negative.    BP 110/78  Pulse 72  Ht 5' 6.5" (1.689 m)  Wt 186 lb 8 oz (84.596 kg)  BMI 29.65 kg/m2  Physical Exam  Nursing note and vitals reviewed. Constitutional: He is oriented to person, place, and time. He appears well-developed and well-nourished.  HENT:  Head: Normocephalic.  Nose: Nose normal.  Mouth/Throat: Oropharynx is clear and moist.  Eyes: Conjunctivae normal are normal. Pupils are equal, round, and reactive to light.  Neck: Normal range of motion. Neck supple. No JVD present.  Cardiovascular: Normal rate, regular rhythm, S1 normal, S2 normal and intact distal pulses.  Exam reveals no gallop and no friction rub.   Murmur heard.  Crescendo systolic murmur is present with a grade of 1/6    Pulmonary/Chest: Effort normal and breath sounds normal. No respiratory distress. He has no wheezes. He has no rales. He exhibits no tenderness.  Abdominal: Soft. Bowel sounds are normal. He exhibits no distension. There is no tenderness.  Musculoskeletal: Normal range of motion. He exhibits no edema and no tenderness.  Lymphadenopathy:    He has no cervical adenopathy.  Neurological: He is alert and oriented to person, place, and time. Coordination normal.  Skin: Skin is warm and dry. No rash noted. No erythema.  Psychiatric: He has a normal mood and affect. His behavior is normal. Judgment and thought content normal.           Assessment and Plan

## 2011-10-10 NOTE — Assessment & Plan Note (Signed)
He is non-compliant with his inhalers per his wife. Symptoms are mild her the patient.

## 2011-10-10 NOTE — Assessment & Plan Note (Signed)
Cholesterol is at goal on the current lipid regimen. No changes to the medications were made.  

## 2011-10-10 NOTE — Patient Instructions (Addendum)
You are doing well. No medication changes were made.  Please call us if you have new issues that need to be addressed before your next appt.  Your physician wants you to follow-up in: 6 months.  You will receive a reminder letter in the mail two months in advance. If you don't receive a letter, please call our office to schedule the follow-up appointment.   

## 2011-10-10 NOTE — Assessment & Plan Note (Signed)
60% disease on the right. Continue aggressive cholesterol management.

## 2011-10-10 NOTE — Assessment & Plan Note (Signed)
We have suggested he stay on his Florinef and if he has worsening symptoms, he could alternate a full pill with a half pill and titrate upward slowly.

## 2011-10-20 ENCOUNTER — Other Ambulatory Visit: Payer: Self-pay | Admitting: Internal Medicine

## 2011-10-20 MED ORDER — FLUDROCORTISONE ACETATE 0.1 MG PO TABS: 0.0500 mg | ORAL_TABLET | Freq: Every day | ORAL | Status: DC

## 2011-10-20 NOTE — Telephone Encounter (Signed)
Levothyroxine 137 Qty. 90 take 1 tablet by mouth daily (137 mcg total)

## 2011-10-20 NOTE — Telephone Encounter (Signed)
Fludrocortisone 0.1 mg ta 15 Tab Qty. Take 0.5 tablets (0.05 mg total) by mouth daily

## 2011-10-28 ENCOUNTER — Encounter: Payer: Self-pay | Admitting: Internal Medicine

## 2011-10-28 ENCOUNTER — Ambulatory Visit: Payer: Self-pay | Admitting: Oncology

## 2011-10-28 ENCOUNTER — Ambulatory Visit (INDEPENDENT_AMBULATORY_CARE_PROVIDER_SITE_OTHER): Payer: Medicare Other | Admitting: Internal Medicine

## 2011-10-28 VITALS — BP 120/68 | HR 72 | Temp 98.4°F | Ht 65.0 in | Wt 182.5 lb

## 2011-10-28 DIAGNOSIS — R5383 Other fatigue: Secondary | ICD-10-CM | POA: Insufficient documentation

## 2011-10-28 DIAGNOSIS — J019 Acute sinusitis, unspecified: Secondary | ICD-10-CM | POA: Insufficient documentation

## 2011-10-28 DIAGNOSIS — G8929 Other chronic pain: Secondary | ICD-10-CM

## 2011-10-28 DIAGNOSIS — D649 Anemia, unspecified: Secondary | ICD-10-CM

## 2011-10-28 DIAGNOSIS — E039 Hypothyroidism, unspecified: Secondary | ICD-10-CM

## 2011-10-28 DIAGNOSIS — R5381 Other malaise: Secondary | ICD-10-CM

## 2011-10-28 DIAGNOSIS — M549 Dorsalgia, unspecified: Secondary | ICD-10-CM

## 2011-10-28 LAB — CBC WITH DIFFERENTIAL/PLATELET
Basophils Absolute: 0 10*3/uL (ref 0.0–0.1)
Basophils Relative: 0.4 % (ref 0.0–3.0)
HCT: 36.9 % — ABNORMAL LOW (ref 39.0–52.0)
Hemoglobin: 12 g/dL — ABNORMAL LOW (ref 13.0–17.0)
Lymphs Abs: 0.6 10*3/uL — ABNORMAL LOW (ref 0.7–4.0)
Monocytes Relative: 14.3 % — ABNORMAL HIGH (ref 3.0–12.0)
Neutro Abs: 2.6 10*3/uL (ref 1.4–7.7)
RDW: 13.5 % (ref 11.5–14.6)

## 2011-10-28 LAB — COMPREHENSIVE METABOLIC PANEL
ALT: 18 U/L (ref 0–53)
Alkaline Phosphatase: 49 U/L (ref 39–117)
Glucose, Bld: 95 mg/dL (ref 70–99)
Sodium: 127 mEq/L — ABNORMAL LOW (ref 135–145)
Total Bilirubin: 0.8 mg/dL (ref 0.3–1.2)
Total Protein: 7 g/dL (ref 6.0–8.3)

## 2011-10-28 LAB — TSH: TSH: 1 u[IU]/mL (ref 0.35–5.50)

## 2011-10-28 MED ORDER — AMOXICILLIN-POT CLAVULANATE 875-125 MG PO TABS: 1.0000 | ORAL_TABLET | Freq: Two times a day (BID) | ORAL | Status: DC

## 2011-10-28 MED ORDER — GABAPENTIN 100 MG PO CAPS: 100.0000 mg | ORAL_CAPSULE | Freq: Every day | ORAL | Status: DC

## 2011-10-28 NOTE — Progress Notes (Signed)
Subjective:    Patient ID: Johnny Sanford, male    DOB: 1940/12/31, 71 y.o.   MRN: [id number]  HPI 71 year old male with history of labile hypertension, essential thrombocythemia, hypothyroidism, and hyperlipidemia as well as chronic low back pain secondary to degenerative arthritis presents for acute visit complaining of fatigue. He reports that fatigue has been progressively getting worse over the last couple of months. He first noticed symptoms around July when he was started on Neurontin 300 mg nightly to help with back pain. He reports feeling weak and sluggish during the day. He denies any focal symptoms such as shortness of breath, chest pain. He has recently had increased nasal congestion with frontal sinus pressure. He denies any fever, chills. He does have occasional nonproductive cough which is most pronounced at night.  Outpatient Encounter Prescriptions as of 10/28/2011  Medication Sig Dispense Refill  . ALPRAZolam (XANAX) 0.5 MG tablet Take 0.5 mg by mouth as needed.      Marland Kitchen aspirin 81 MG EC tablet Take 162 mg by mouth daily.       . budesonide-formoterol (SYMBICORT) 160-4.5 MCG/ACT inhaler Inhale 2 puffs into the lungs 2 (two) times daily.  1 Inhaler  3  . Calcium Carb-Cholecalciferol (CALCIUM 1000 + D PO) Take 1 tablet by mouth daily.        . Cyanocobalamin (VITAMIN B-12 PO) Take by mouth daily.      . fludrocortisone (FLORINEF) 0.1 MG tablet Take 0.5 tablets (0.05 mg total) by mouth daily.  90 tablet  3  . fluorouracil (EFUDEX) 5 % cream Apply topically as needed.      . fluticasone (FLONASE) 50 MCG/ACT nasal spray Place 1 spray into the nose daily as needed.      . hydrALAZINE (APRESOLINE) 25 MG tablet Take 1 tablet (25 mg total) by mouth 3 (three) times daily. Use as needed for BP >160/100  90 tablet  3  . hydroxyurea (HYDREA) 500 MG capsule Take 500 mg by mouth as directed. May take with food to minimize GI side effects.       Marland Kitchen levothyroxine (SYNTHROID, LEVOTHROID) 137 MCG tablet  Take 1 tablet (137 mcg total) by mouth daily.  90 tablet  3  . omeprazole (PRILOSEC) 20 MG capsule Take 1 capsule (20 mg total) by mouth daily.  90 capsule  3  . simvastatin (ZOCOR) 20 MG tablet Take 1 tablet (20 mg total) by mouth daily.  90 tablet  1  . traZODone (DESYREL) 50 MG tablet Take 0.5-1 tablets (25-50 mg total) by mouth at bedtime as needed for sleep.  30 tablet  3  . DISCONTD: gabapentin (NEURONTIN) 300 MG capsule daily.       Marland Kitchen amoxicillin-clavulanate (AUGMENTIN) 875-125 MG per tablet Take 1 tablet by mouth 2 (two) times daily.  20 tablet  0  . gabapentin (NEURONTIN) 100 MG capsule Take 1 capsule (100 mg total) by mouth at bedtime.  30 capsule  3  . OMEGA-3 1000 MG CAPS Take 1 capsule by mouth daily.         BP 120/68  Pulse 72  Temp 98.4 F (36.9 C) (Oral)  Ht 5\' 5"  (1.651 m)  Wt 182 lb 8 oz (82.781 kg)  BMI 30.37 kg/m2  SpO2 99%  Review of Systems  Constitutional: Positive for fatigue. Negative for fever, chills, activity change, appetite change and unexpected weight change.  HENT: Positive for congestion, rhinorrhea, postnasal drip and sinus pressure. Negative for ear pain and neck pain.   Eyes:  Negative for visual disturbance.  Respiratory: Negative for cough and shortness of breath.   Cardiovascular: Negative for chest pain, palpitations and leg swelling.  Gastrointestinal: Negative for abdominal pain and abdominal distention.  Genitourinary: Negative for dysuria, urgency and difficulty urinating.  Musculoskeletal: Positive for myalgias, back pain and arthralgias. Negative for gait problem.  Skin: Negative for color change and rash.  Hematological: Negative for adenopathy.  Psychiatric/Behavioral: Negative for disturbed wake/sleep cycle and dysphoric mood. The patient is not nervous/anxious.        Objective:   Physical Exam  Constitutional: He is oriented to person, place, and time. He appears well-developed and well-nourished. No distress.  HENT:  Head:  Normocephalic and atraumatic.  Right Ear: External ear normal. A middle ear effusion is present.  Left Ear: External ear normal. A middle ear effusion is present.  Nose: Mucosal edema present. Right sinus exhibits frontal sinus tenderness. Left sinus exhibits frontal sinus tenderness.  Mouth/Throat: Oropharynx is clear and moist. No oropharyngeal exudate.  Eyes: Conjunctivae normal and EOM are normal. Pupils are equal, round, and reactive to light. Right eye exhibits no discharge. Left eye exhibits no discharge. No scleral icterus.  Neck: Normal range of motion. Neck supple. No tracheal deviation present. No thyromegaly present.  Cardiovascular: Normal rate, regular rhythm and normal heart sounds.  Exam reveals no gallop and no friction rub.   No murmur heard. Pulmonary/Chest: Effort normal and breath sounds normal. No respiratory distress. He has no wheezes. He has no rales. He exhibits no tenderness.  Musculoskeletal: Normal range of motion. He exhibits no edema.  Lymphadenopathy:    He has no cervical adenopathy.  Neurological: He is alert and oriented to person, place, and time. No cranial nerve deficit. Coordination normal.  Skin: Skin is warm and dry. No rash noted. He is not diaphoretic. No erythema. No pallor.  Psychiatric: He has a normal mood and affect. His behavior is normal. Judgment and thought content normal.          Assessment & Plan:

## 2011-10-28 NOTE — Assessment & Plan Note (Signed)
Chronic low back pain followed by orthopedics. Having some oversedation with Neurontin so we'll decrease dose to 100 mg daily at bedtime. Followup one month.

## 2011-10-28 NOTE — Assessment & Plan Note (Signed)
Symptoms consistent with acute sinusitis. Will treat with Augmentin. Patient will call his symptoms are not improving. Followup 4 weeks.

## 2011-10-28 NOTE — Assessment & Plan Note (Signed)
Symptoms of fatigue likely multifactorial with sedation from medications and ongoing sinus infection. Will treat as above with decrease dose of Neurontin and adding antibiotic therapy. Patient will followup in one month. Will check labs today including CBC, TSH, CMP, and B12.

## 2011-10-30 ENCOUNTER — Telehealth: Payer: Self-pay | Admitting: Internal Medicine

## 2011-10-30 MED ORDER — SIMVASTATIN 20 MG PO TABS: 20.0000 mg | ORAL_TABLET | Freq: Every day | ORAL | Status: DC

## 2011-10-30 NOTE — Telephone Encounter (Signed)
Refill request for simvastatin 20 mg tablet. 90 tab Sig: take 1 tablet (20 mg total) by mouth daily

## 2011-10-30 NOTE — Telephone Encounter (Signed)
Rx sent to pharmacy electronically.   

## 2011-11-13 ENCOUNTER — Ambulatory Visit (INDEPENDENT_AMBULATORY_CARE_PROVIDER_SITE_OTHER): Payer: Medicare Other | Admitting: Internal Medicine

## 2011-11-13 ENCOUNTER — Encounter: Payer: Self-pay | Admitting: Internal Medicine

## 2011-11-13 VITALS — BP 122/80 | HR 82 | Temp 98.3°F | Ht 65.0 in | Wt 187.5 lb

## 2011-11-13 DIAGNOSIS — H60399 Other infective otitis externa, unspecified ear: Secondary | ICD-10-CM

## 2011-11-13 DIAGNOSIS — I10 Essential (primary) hypertension: Secondary | ICD-10-CM

## 2011-11-13 DIAGNOSIS — G4700 Insomnia, unspecified: Secondary | ICD-10-CM

## 2011-11-13 DIAGNOSIS — D473 Essential (hemorrhagic) thrombocythemia: Secondary | ICD-10-CM

## 2011-11-13 DIAGNOSIS — H6091 Unspecified otitis externa, right ear: Secondary | ICD-10-CM | POA: Insufficient documentation

## 2011-11-13 DIAGNOSIS — H6690 Otitis media, unspecified, unspecified ear: Secondary | ICD-10-CM

## 2011-11-13 DIAGNOSIS — H6691 Otitis media, unspecified, right ear: Secondary | ICD-10-CM | POA: Insufficient documentation

## 2011-11-13 MED ORDER — TRAZODONE HCL 50 MG PO TABS: 25.0000 mg | ORAL_TABLET | Freq: Every evening | ORAL | Status: DC | PRN

## 2011-11-13 MED ORDER — LEVOFLOXACIN 500 MG PO TABS: 500.0000 mg | ORAL_TABLET | Freq: Every day | ORAL | Status: DC

## 2011-11-13 MED ORDER — CIPROFLOXACIN-DEXAMETHASONE 0.3-0.1 % OT SUSP: 4.0000 [drp] | Freq: Two times a day (BID) | OTIC | Status: DC

## 2011-11-13 NOTE — Assessment & Plan Note (Signed)
Blood pressures have been markedly labile over the last few weeks however patient notes increased stress during this time. He is taking hydralazine on 3 occasions for blood pressure greater than 160/100. He reports that blood pressure seems to have improved since returning home from recent trip. He will continue to monitor and use hydralazine as needed for blood pressure greater than 160/100.

## 2011-11-13 NOTE — Assessment & Plan Note (Signed)
Exam is consistent with right otitis media. Will treat with Levaquin given failure of Augmentin. Patient will call if symptoms are not improving. Followup 2 weeks.

## 2011-11-13 NOTE — Assessment & Plan Note (Signed)
Symptoms and exam are consistent with right otitis externa. Will treat with topical Ciprodex. Patient will call if symptoms are not improving. Followup 2 weeks.

## 2011-11-13 NOTE — Progress Notes (Signed)
Subjective:    Patient ID: Johnny Sanford, male    DOB: 20-Apr-1940, 71 y.o.   MRN: [id number]  HPI 70 year old male with history of labile hypertension, hypothyroidism, essential thrombocythemia presents for followup. At his last visit, he was diagnosed with sinusitis. He was treated with Augmentin. He reports that symptoms have been persistent and he has developed gradually worsening right ear pain. He has had difficulty hearing from his right ear. He cleaned his right ear and was able to remove a significant amount of wax. However, he reports no improvement in symptoms of pain or hearing loss. He denies any fever or chills. He denies nasal congestion.  He is also concerned today about labile hypertension. He reports that he recently took a trip to the beach with his son and daughter-in-law. He found this very stressful. During times of stress, his blood pressure is typically 170/110. On those occasions, he used hydralazine with improvement in his blood pressure. He denies any chest pain, headache, palpitations. Since returning home, blood pressure has been better controlled.  He was also noted on last check of blood counts to have increasing platelet count. He notes that he was instructed by his hematologist increase dose of hydroxyurea. He has followup scheduled with hematology in December and scheduled lab work in November 2013.  Outpatient Encounter Prescriptions as of 11/13/2011  Medication Sig Dispense Refill  . ALPRAZolam (XANAX) 0.5 MG tablet Take 0.5 mg by mouth as needed.      Marland Kitchen aspirin 81 MG EC tablet Take 162 mg by mouth daily.       . budesonide-formoterol (SYMBICORT) 160-4.5 MCG/ACT inhaler Inhale 2 puffs into the lungs 2 (two) times daily.  1 Inhaler  3  . Calcium Carb-Cholecalciferol (CALCIUM 1000 + D PO) Take 1 tablet by mouth daily.        . Cyanocobalamin (VITAMIN B-12 PO) Take by mouth daily.      . fludrocortisone (FLORINEF) 0.1 MG tablet Take 0.5 tablets (0.05 mg total) by  mouth daily.  90 tablet  3  . fluorouracil (EFUDEX) 5 % cream Apply topically as needed.      . fluticasone (FLONASE) 50 MCG/ACT nasal spray Place 1 spray into the nose daily as needed.      . gabapentin (NEURONTIN) 100 MG capsule Take 1 capsule (100 mg total) by mouth at bedtime.  30 capsule  3  . hydrALAZINE (APRESOLINE) 25 MG tablet Take 1 tablet (25 mg total) by mouth 3 (three) times daily. Use as needed for BP >160/100  90 tablet  3  . hydroxyurea (HYDREA) 500 MG capsule Take 500 mg by mouth as directed. May take with food to minimize GI side effects.       Marland Kitchen levothyroxine (SYNTHROID, LEVOTHROID) 137 MCG tablet Take 1 tablet (137 mcg total) by mouth daily.  90 tablet  3  . OMEGA-3 1000 MG CAPS Take 1 capsule by mouth daily.        Marland Kitchen omeprazole (PRILOSEC) 20 MG capsule Take 1 capsule (20 mg total) by mouth daily.  90 capsule  3  . simvastatin (ZOCOR) 20 MG tablet Take 1 tablet (20 mg total) by mouth daily.  90 tablet  3  . traZODone (DESYREL) 50 MG tablet Take 0.5-1 tablets (25-50 mg total) by mouth at bedtime as needed for sleep.  30 tablet  3  . DISCONTD: amoxicillin-clavulanate (AUGMENTIN) 875-125 MG per tablet Take 1 tablet by mouth 2 (two) times daily.  20 tablet  0  . DISCONTD:  traZODone (DESYREL) 50 MG tablet Take 0.5-1 tablets (25-50 mg total) by mouth at bedtime as needed for sleep.  30 tablet  3  . ciprofloxacin-dexamethasone (CIPRODEX) otic suspension Place 4 drops into the right ear 2 (two) times daily.  7.5 mL  0  . levofloxacin (LEVAQUIN) 500 MG tablet Take 1 tablet (500 mg total) by mouth daily.  7 tablet  0    Review of Systems  Constitutional: Negative for fever, chills, activity change, appetite change, fatigue and unexpected weight change.  HENT: Positive for ear pain and ear discharge. Negative for congestion, sore throat, rhinorrhea, sneezing and postnasal drip.   Eyes: Negative for visual disturbance.  Respiratory: Negative for cough and shortness of breath.     Cardiovascular: Negative for chest pain, palpitations and leg swelling.  Gastrointestinal: Negative for abdominal pain and abdominal distention.  Genitourinary: Negative for dysuria, urgency and difficulty urinating.  Musculoskeletal: Negative for arthralgias and gait problem.  Skin: Negative for color change and rash.  Hematological: Negative for adenopathy.  Psychiatric/Behavioral: Negative for disturbed wake/sleep cycle and dysphoric mood. The patient is not nervous/anxious.        Objective:   Physical Exam  Constitutional: He is oriented to person, place, and time. He appears well-developed and well-nourished. No distress.  HENT:  Head: Normocephalic and atraumatic.  Right Ear: There is drainage, swelling and tenderness. Tympanic membrane is injected, erythematous and bulging. A middle ear effusion is present.  Left Ear: Tympanic membrane, external ear and ear canal normal.  Nose: Nose normal.  Mouth/Throat: Oropharynx is clear and moist. No oropharyngeal exudate.       Right ear canal with purulent exudate, induration, and edema  Eyes: Conjunctivae normal and EOM are normal. Pupils are equal, round, and reactive to light. Right eye exhibits no discharge. Left eye exhibits no discharge. No scleral icterus.  Neck: Normal range of motion. Neck supple. No tracheal deviation present. No thyromegaly present.  Cardiovascular: Normal rate, regular rhythm and normal heart sounds.  Exam reveals no gallop and no friction rub.   No murmur heard. Pulmonary/Chest: Effort normal and breath sounds normal. No respiratory distress. He has no wheezes. He has no rales. He exhibits no tenderness.  Musculoskeletal: Normal range of motion. He exhibits no edema.  Lymphadenopathy:    He has no cervical adenopathy.  Neurological: He is alert and oriented to person, place, and time. No cranial nerve deficit. Coordination normal.  Skin: Skin is warm and dry. No rash noted. He is not diaphoretic. No  erythema. No pallor.  Psychiatric: He has a normal mood and affect. His behavior is normal. Judgment and thought content normal.          Assessment & Plan:

## 2011-11-13 NOTE — Assessment & Plan Note (Signed)
Platelets were noted to be increased on last check. Patient has increased his dose of hydroxyurea at the instruction of his hematologist. He has repeat CBC scheduled for next month.

## 2011-11-26 ENCOUNTER — Ambulatory Visit (INDEPENDENT_AMBULATORY_CARE_PROVIDER_SITE_OTHER): Payer: Medicare Other | Admitting: Internal Medicine

## 2011-11-26 ENCOUNTER — Encounter: Payer: Self-pay | Admitting: Internal Medicine

## 2011-11-26 VITALS — BP 132/84 | HR 75 | Temp 97.5°F | Ht 65.0 in | Wt 186.5 lb

## 2011-11-26 DIAGNOSIS — G8929 Other chronic pain: Secondary | ICD-10-CM

## 2011-11-26 DIAGNOSIS — M549 Dorsalgia, unspecified: Secondary | ICD-10-CM

## 2011-11-26 DIAGNOSIS — R42 Dizziness and giddiness: Secondary | ICD-10-CM

## 2011-11-26 DIAGNOSIS — H65499 Other chronic nonsuppurative otitis media, unspecified ear: Secondary | ICD-10-CM | POA: Insufficient documentation

## 2011-11-26 NOTE — Progress Notes (Signed)
Subjective:    Patient ID: Johnny Sanford, male    DOB: 04-Aug-1940, 71 y.o.   MRN: [id number]  HPI 71 year old male with history of labile hypertension, peripheral arterial disease, rheumatoid arthritis, and essential thrombocythemia presents for followup. At his last visit he was noted to have right otitis media and bilateral external ear infections. He was treated with antibiotics. He reports symptoms of dizziness have become gradually worse over the last few weeks. He has had several falls secondary to dizziness. He denies any syncope. He has some pain in his upper back and lower posterior neck. However, he reports he was recently evaluated by his vascular surgeon with carotid Dopplers which were normal. He denies visual changes. His blood pressure has been labile which is chronic for him but he has not had persistent low blood pressure or low blood pressure associated with dizziness.  Outpatient Encounter Prescriptions as of 11/26/2011  Medication Sig Dispense Refill  . ALPRAZolam (XANAX) 0.5 MG tablet Take 0.5 mg by mouth as needed.      Marland Kitchen aspirin 81 MG EC tablet Take 162 mg by mouth daily.       . budesonide-formoterol (SYMBICORT) 160-4.5 MCG/ACT inhaler Inhale 2 puffs into the lungs 2 (two) times daily.  1 Inhaler  3  . Calcium Carb-Cholecalciferol (CALCIUM 1000 + D PO) Take 1 tablet by mouth daily.        . ciprofloxacin-dexamethasone (CIPRODEX) otic suspension Place 4 drops into the right ear 2 (two) times daily.  7.5 mL  0  . Cyanocobalamin (VITAMIN B-12 PO) Take by mouth daily.      . fludrocortisone (FLORINEF) 0.1 MG tablet Take 0.5 tablets (0.05 mg total) by mouth daily.  90 tablet  3  . fluorouracil (EFUDEX) 5 % cream Apply topically as needed.      . fluticasone (FLONASE) 50 MCG/ACT nasal spray Place 1 spray into the nose daily as needed.      . gabapentin (NEURONTIN) 100 MG capsule Take 1 capsule (100 mg total) by mouth at bedtime.  30 capsule  3  . hydrALAZINE (APRESOLINE) 25 MG  tablet Take 1 tablet (25 mg total) by mouth 3 (three) times daily. Use as needed for BP >160/100  90 tablet  3  . hydroxyurea (HYDREA) 500 MG capsule Take 500 mg by mouth as directed. May take with food to minimize GI side effects.       Marland Kitchen levofloxacin (LEVAQUIN) 500 MG tablet Take 1 tablet (500 mg total) by mouth daily.  7 tablet  0  . levothyroxine (SYNTHROID, LEVOTHROID) 137 MCG tablet Take 1 tablet (137 mcg total) by mouth daily.  90 tablet  3  . OMEGA-3 1000 MG CAPS Take 1 capsule by mouth daily.        Marland Kitchen omeprazole (PRILOSEC) 20 MG capsule Take 1 capsule (20 mg total) by mouth daily.  90 capsule  3  . simvastatin (ZOCOR) 20 MG tablet Take 1 tablet (20 mg total) by mouth daily.  90 tablet  3  . traZODone (DESYREL) 50 MG tablet Take 0.5-1 tablets (25-50 mg total) by mouth at bedtime as needed for sleep.  30 tablet  3   BP 132/84  Pulse 75  Temp 97.5 F (36.4 C) (Oral)  Ht 5\' 5"  (1.651 m)  Wt 186 lb 8 oz (84.596 kg)  BMI 31.04 kg/m2  SpO2 97%  Review of Systems  Constitutional: Positive for fatigue. Negative for fever, chills, activity change, appetite change and unexpected weight change.  HENT:  Positive for neck pain. Negative for hearing loss, ear pain, congestion, neck stiffness, sinus pressure, tinnitus and ear discharge.   Eyes: Negative for visual disturbance.  Respiratory: Negative for cough and shortness of breath.   Cardiovascular: Negative for chest pain, palpitations and leg swelling.  Gastrointestinal: Negative for abdominal pain and abdominal distention.  Genitourinary: Negative for dysuria, urgency and difficulty urinating.  Musculoskeletal: Positive for back pain and arthralgias. Negative for gait problem.  Skin: Negative for color change and rash.  Neurological: Positive for dizziness and light-headedness. Negative for tremors, seizures, syncope, facial asymmetry, speech difficulty, weakness, numbness and headaches.  Hematological: Negative for adenopathy.    Psychiatric/Behavioral: Negative for disturbed wake/sleep cycle and dysphoric mood. The patient is not nervous/anxious.        Objective:   Physical Exam  Constitutional: He is oriented to person, place, and time. He appears well-developed and well-nourished. No distress.  HENT:  Head: Normocephalic and atraumatic.  Right Ear: External ear normal. Tympanic membrane is bulging. Tympanic membrane is not erythematous. A middle ear effusion is present.  Left Ear: External ear normal. Tympanic membrane is not erythematous and not bulging.  No middle ear effusion.  Nose: Nose normal.  Mouth/Throat: Oropharynx is clear and moist. No oropharyngeal exudate.  Eyes: Conjunctivae normal and EOM are normal. Pupils are equal, round, and reactive to light. Right eye exhibits no discharge. Left eye exhibits no discharge. No scleral icterus.  Neck: Normal range of motion. Neck supple. No tracheal deviation present. No thyromegaly present.  Cardiovascular: Normal rate, regular rhythm and normal heart sounds.  Exam reveals no gallop and no friction rub.   No murmur heard. Pulmonary/Chest: Effort normal and breath sounds normal. No respiratory distress. He has no wheezes. He has no rales. He exhibits no tenderness.  Musculoskeletal: He exhibits no edema.       Cervical back: He exhibits decreased range of motion, tenderness, deformity (kyphosis), pain and spasm.  Lymphadenopathy:    He has no cervical adenopathy.  Neurological: He is alert and oriented to person, place, and time. No cranial nerve deficit. Coordination normal.  Skin: Skin is warm and dry. No rash noted. He is not diaphoretic. No erythema. No pallor.  Psychiatric: He has a normal mood and affect. His behavior is normal. Judgment and thought content normal.          Assessment & Plan:

## 2011-11-26 NOTE — Assessment & Plan Note (Signed)
Patient with chronic back pain secondary to degenerative osteoarthritis and rheumatoid arthritis. Pain in his cervical spine has recently worsened. He has been occasionally using a soft support brace for some improvement. Encouraged him to continue with this. He has been intolerant to use of steroids in the past. We discussed using muscle relaxers to help with symptoms. He will also continue to follow with orthopedics.

## 2011-11-26 NOTE — Assessment & Plan Note (Signed)
Suspect dizziness secondary to chronic middle ear effusion on the right. Will set up ENT evaluation as patient has failed treatment with antibiotics. He is intolerant of steroids so unable to take oral prednisone. Other causes of dizziness such as carotid stenosis are less likely given he had recent carotid Doppler that was normal per his report.

## 2011-11-26 NOTE — Assessment & Plan Note (Signed)
Will set up ENT evaluation of chronic right middle ear effusion. Patient is having dizziness and frequent falls that I suspect are related to middle ear effusion. Question if he would benefit from tympanostomy tube.

## 2011-11-27 ENCOUNTER — Encounter: Payer: Self-pay | Admitting: Internal Medicine

## 2011-12-02 ENCOUNTER — Encounter: Payer: Self-pay | Admitting: Internal Medicine

## 2011-12-10 ENCOUNTER — Ambulatory Visit: Payer: Medicare Other | Admitting: Internal Medicine

## 2011-12-17 ENCOUNTER — Encounter: Payer: Self-pay | Admitting: Internal Medicine

## 2011-12-24 ENCOUNTER — Ambulatory Visit (INDEPENDENT_AMBULATORY_CARE_PROVIDER_SITE_OTHER): Payer: Medicare Other | Admitting: Internal Medicine

## 2011-12-24 ENCOUNTER — Telehealth: Payer: Self-pay | Admitting: Internal Medicine

## 2011-12-24 ENCOUNTER — Encounter: Payer: Self-pay | Admitting: Internal Medicine

## 2011-12-24 ENCOUNTER — Ambulatory Visit (INDEPENDENT_AMBULATORY_CARE_PROVIDER_SITE_OTHER)
Admission: RE | Admit: 2011-12-24 | Discharge: 2011-12-24 | Disposition: A | Discharge status: Home / Self Care | Payer: Medicare Other | Source: Ambulatory Visit | Attending: Internal Medicine | Admitting: Internal Medicine

## 2011-12-24 VITALS — BP 134/82 | HR 82 | Temp 98.0°F | Resp 16 | Wt 187.2 lb

## 2011-12-24 DIAGNOSIS — I10 Essential (primary) hypertension: Secondary | ICD-10-CM

## 2011-12-24 DIAGNOSIS — D510 Vitamin B12 deficiency anemia due to intrinsic factor deficiency: Secondary | ICD-10-CM

## 2011-12-24 DIAGNOSIS — Z125 Encounter for screening for malignant neoplasm of prostate: Secondary | ICD-10-CM

## 2011-12-24 DIAGNOSIS — R05 Cough: Secondary | ICD-10-CM

## 2011-12-24 DIAGNOSIS — E871 Hypo-osmolality and hyponatremia: Secondary | ICD-10-CM

## 2011-12-24 DIAGNOSIS — R5383 Other fatigue: Secondary | ICD-10-CM

## 2011-12-24 DIAGNOSIS — D473 Essential (hemorrhagic) thrombocythemia: Secondary | ICD-10-CM

## 2011-12-24 LAB — CBC WITH DIFFERENTIAL/PLATELET
Basophils Absolute: 0 10*3/uL (ref 0.0–0.1)
HCT: 35.8 % — ABNORMAL LOW (ref 39.0–52.0)
Hemoglobin: 11.8 g/dL — ABNORMAL LOW (ref 13.0–17.0)
Lymphs Abs: 0.6 10*3/uL — ABNORMAL LOW (ref 0.7–4.0)
Monocytes Relative: 13.3 % — ABNORMAL HIGH (ref 3.0–12.0)
Neutro Abs: 3.8 10*3/uL (ref 1.4–7.7)
RDW: 14.4 % (ref 11.5–14.6)

## 2011-12-24 LAB — COMPREHENSIVE METABOLIC PANEL
ALT: 18 U/L (ref 0–53)
AST: 27 U/L (ref 0–37)
Albumin: 4 g/dL (ref 3.5–5.2)
Alkaline Phosphatase: 66 U/L (ref 39–117)
Potassium: 4.8 mEq/L (ref 3.5–5.1)
Sodium: 123 mEq/L — ABNORMAL LOW (ref 135–145)
Total Protein: 7.1 g/dL (ref 6.0–8.3)

## 2011-12-24 LAB — POCT URINALYSIS DIPSTICK
Blood, UA: NEGATIVE
Ketones, UA: NEGATIVE
Protein, UA: NEGATIVE
Spec Grav, UA: 1.015
pH, UA: 7.5

## 2011-12-24 LAB — VITAMIN B12: Vitamin B-12: 418 pg/mL (ref 211–911)

## 2011-12-24 MED ORDER — FLUTICASONE PROPIONATE 50 MCG/ACT NA SUSP: 1.0000 | Freq: Every day | NASAL | Status: DC | PRN

## 2011-12-24 NOTE — Assessment & Plan Note (Signed)
Patient with symptoms of fatigue without focal findings. Exam is normal. Labs are remarkable for anemia and thrombocytosis consistent with his history of thrombocytopenia. Recommended that he seek followup with his hematologist I question whether hydroxyurea and anemia may be leading to ongoing symptoms of fatigue. Question if he may need additional treatment for thrombocytosis. Followup in one month or sooner as needed.

## 2011-12-24 NOTE — Assessment & Plan Note (Signed)
Patient has been adjusting his dose of hydroxyurea, however platelet count continues to be elevated. He is also anemic. Recommended that he followup with his hematologist. Question if he may need additional evaluation including bone marrow biopsy.

## 2011-12-24 NOTE — Telephone Encounter (Signed)
Med filled.  

## 2011-12-24 NOTE — Progress Notes (Signed)
Subjective:    Patient ID: Johnny Sanford, male    DOB: [birth year], 71 y.o.   MRN: [id number]  HPI 71 year old male with history of rheumatoid arthritis, labile hypertension, essential thrombocythemia, peripheral vascular disease presents for followup. He is concerned about progressive fatigue over the last several months. He reports being generally tired most of the time. He reports that he sleeps well. He denies any focal symptoms such as chest pain, shortness of breath, change in appetite, fever, chills, change in bowel habits. He was recently treated for a middle ear infection and vertigo. Symptoms of vertigo have resolved.  In regards to a essential thrombocythemia, he reports he has been adjusting his dose of hydroxyurea given a platelet count has recently been elevated. He denies any easy bruising or bleeding. He denies any blood in his stool or black stool. He has not recently been seen by his hematologist.  Outpatient Encounter Prescriptions as of 12/24/2011  Medication Sig Dispense Refill  . AFLURIA injection       . ALPRAZolam (XANAX) 0.5 MG tablet Take 0.5 mg by mouth as needed.      Marland Kitchen aspirin 81 MG EC tablet Take 162 mg by mouth daily.       . budesonide-formoterol (SYMBICORT) 160-4.5 MCG/ACT inhaler Inhale 2 puffs into the lungs 2 (two) times daily.  1 Inhaler  3  . Calcium Carb-Cholecalciferol (CALCIUM 1000 + D PO) Take 1 tablet by mouth daily.        . ciprofloxacin-dexamethasone (CIPRODEX) otic suspension Place 4 drops into the right ear 2 (two) times daily.  7.5 mL  0  . Cyanocobalamin (VITAMIN B-12 PO) Take by mouth daily.      . fludrocortisone (FLORINEF) 0.1 MG tablet Take 0.5 tablets (0.05 mg total) by mouth daily.  90 tablet  3  . fluorouracil (EFUDEX) 5 % cream Apply topically as needed.      . fluticasone (FLONASE) 50 MCG/ACT nasal spray Place 1 spray into the nose daily as needed.  16 g  1  . gabapentin (NEURONTIN) 100 MG capsule Take 1 capsule (100 mg total) by mouth at  bedtime.  30 capsule  3  . hydrALAZINE (APRESOLINE) 25 MG tablet Take 1 tablet (25 mg total) by mouth 3 (three) times daily. Use as needed for BP >160/100  90 tablet  3  . hydroxyurea (HYDREA) 500 MG capsule Take 500 mg by mouth as directed. May take with food to minimize GI side effects.       Marland Kitchen levofloxacin (LEVAQUIN) 500 MG tablet Take 1 tablet (500 mg total) by mouth daily.  7 tablet  0  . levothyroxine (SYNTHROID, LEVOTHROID) 137 MCG tablet Take 1 tablet (137 mcg total) by mouth daily.  90 tablet  3  . OMEGA-3 1000 MG CAPS Take 1 capsule by mouth daily.        Marland Kitchen omeprazole (PRILOSEC) 20 MG capsule Take 1 capsule (20 mg total) by mouth daily.  90 capsule  3  . simvastatin (ZOCOR) 20 MG tablet Take 1 tablet (20 mg total) by mouth daily.  90 tablet  3  . traZODone (DESYREL) 50 MG tablet Take 0.5-1 tablets (25-50 mg total) by mouth at bedtime as needed for sleep.  30 tablet  3   BP 134/82  Pulse 82  Temp 98 F (36.7 C) (Oral)  Resp 16  Wt 187 lb 4 oz (84.936 kg)  SpO2 94%  Review of Systems  Constitutional: Positive for fatigue. Negative for fever, chills, activity change,  appetite change and unexpected weight change.  Eyes: Negative for visual disturbance.  Respiratory: Positive for cough. Negative for shortness of breath.   Cardiovascular: Negative for chest pain, palpitations and leg swelling.  Gastrointestinal: Negative for abdominal pain and abdominal distention.  Genitourinary: Negative for dysuria, urgency and difficulty urinating.  Musculoskeletal: Negative for arthralgias and gait problem.  Skin: Negative for color change and rash.  Hematological: Negative for adenopathy.  Psychiatric/Behavioral: Negative for sleep disturbance and dysphoric mood. The patient is not nervous/anxious.        Objective:   Physical Exam  Constitutional: He is oriented to person, place, and time. He appears well-developed and well-nourished. No distress.  HENT:  Head: Normocephalic and  atraumatic.  Right Ear: External ear normal.  Left Ear: External ear normal.  Nose: Nose normal.  Mouth/Throat: Oropharynx is clear and moist. No oropharyngeal exudate.  Eyes: Conjunctivae normal and EOM are normal. Pupils are equal, round, and reactive to light. Right eye exhibits no discharge. Left eye exhibits no discharge. No scleral icterus.  Neck: Normal range of motion. Neck supple. No tracheal deviation present. No thyromegaly present.  Cardiovascular: Normal rate, regular rhythm and normal heart sounds.  Exam reveals no gallop and no friction rub.   No murmur heard. Pulmonary/Chest: Effort normal. No accessory muscle usage. Not tachypneic. No respiratory distress. He has no decreased breath sounds. He has no wheezes. He has rhonchi (few scattered). He has no rales. He exhibits no tenderness.  Musculoskeletal: Normal range of motion. He exhibits no edema.  Lymphadenopathy:    He has no cervical adenopathy.  Neurological: He is alert and oriented to person, place, and time. No cranial nerve deficit. Coordination normal.  Skin: Skin is warm and dry. No rash noted. He is not diaphoretic. No erythema. No pallor.  Psychiatric: He has a normal mood and affect. His behavior is normal. Judgment and thought content normal.          Assessment & Plan:

## 2011-12-24 NOTE — Assessment & Plan Note (Signed)
Patient with cough over the last several weeks. Exam is remarkable for a few rhonchi diffusely. Chest x-ray is normal. Suspect symptoms are related to chronic postnasal drip. Encouraged him to try adding Claritin to see if this helps the symptoms

## 2011-12-24 NOTE — Telephone Encounter (Signed)
Pt returning for labs Friday

## 2011-12-24 NOTE — Assessment & Plan Note (Signed)
Patient noted to be hyponatremic on labs. Question if he may have SIADH versus volume depletion. Will have him return to clinic to check urine sodium and chloride as well as repeat BMP.

## 2011-12-24 NOTE — Telephone Encounter (Signed)
Refill request for fluticasone prop 50 mcg s Qty: 16 gm Directions: use one spray in each nostril daily

## 2011-12-26 ENCOUNTER — Telehealth: Payer: Self-pay | Admitting: General Practice

## 2011-12-26 ENCOUNTER — Other Ambulatory Visit (INDEPENDENT_AMBULATORY_CARE_PROVIDER_SITE_OTHER): Payer: Medicare Other

## 2011-12-26 DIAGNOSIS — E871 Hypo-osmolality and hyponatremia: Secondary | ICD-10-CM

## 2011-12-26 LAB — COMPREHENSIVE METABOLIC PANEL
Albumin: 4.3 g/dL (ref 3.5–5.2)
BUN: 10 mg/dL (ref 6–23)
CO2: 31 mEq/L (ref 19–32)
Calcium: 9.1 mg/dL (ref 8.4–10.5)
Chloride: 90 mEq/L — ABNORMAL LOW (ref 96–112)
Glucose, Bld: 100 mg/dL — ABNORMAL HIGH (ref 70–99)
Potassium: 4.5 mEq/L (ref 3.5–5.3)

## 2011-12-26 NOTE — Telephone Encounter (Signed)
Notified Pt to return to the office today for follow up urine and serum labs.

## 2011-12-27 LAB — SODIUM, URINE, RANDOM: Sodium, Ur: 57 mEq/L

## 2011-12-31 ENCOUNTER — Ambulatory Visit: Payer: Self-pay | Admitting: Oncology

## 2011-12-31 ENCOUNTER — Encounter: Payer: Self-pay | Admitting: Internal Medicine

## 2011-12-31 LAB — CBC CANCER CENTER
Basophil %: 0.5 %
Eosinophil #: 0 x10 3/mm (ref 0.0–0.7)
Eosinophil %: 1 %
HGB: 12.3 g/dL — ABNORMAL LOW (ref 13.0–18.0)
Lymphocyte %: 15.5 %
MCHC: 33.4 g/dL (ref 32.0–36.0)
Monocyte #: 0.5 x10 3/mm (ref 0.2–1.0)
Monocyte %: 12.6 %
Neutrophil %: 70.4 %
Platelet: 701 x10 3/mm — ABNORMAL HIGH (ref 150–440)
RBC: 3.07 10*6/uL — ABNORMAL LOW (ref 4.40–5.90)
WBC: 4.3 x10 3/mm (ref 3.8–10.6)

## 2012-01-07 LAB — CBC CANCER CENTER
Basophil %: 0.8 %
Eosinophil %: 1.3 %
HCT: 37.1 % — ABNORMAL LOW (ref 40.0–52.0)
HGB: 12.5 g/dL — ABNORMAL LOW (ref 13.0–18.0)
Lymphocyte #: 0.7 x10 3/mm — ABNORMAL LOW (ref 1.0–3.6)
Lymphocyte %: 16.6 %
MCH: 39.5 pg — ABNORMAL HIGH (ref 26.0–34.0)
MCV: 118 fL — ABNORMAL HIGH (ref 80–100)
Monocyte #: 0.6 x10 3/mm (ref 0.2–1.0)
Monocyte %: 14.7 %
Neutrophil #: 2.7 x10 3/mm (ref 1.4–6.5)
RBC: 3.16 10*6/uL — ABNORMAL LOW (ref 4.40–5.90)
RDW: 13.6 % (ref 11.5–14.5)
WBC: 4.1 x10 3/mm (ref 3.8–10.6)

## 2012-01-11 ENCOUNTER — Encounter: Payer: Self-pay | Admitting: Internal Medicine

## 2012-01-12 ENCOUNTER — Observation Stay: Payer: Self-pay | Admitting: Internal Medicine

## 2012-01-12 LAB — CBC
HCT: 35.8 % — ABNORMAL LOW (ref 40.0–52.0)
HGB: 12.2 g/dL — ABNORMAL LOW (ref 13.0–18.0)
MCV: 117 fL — ABNORMAL HIGH (ref 80–100)
Platelet: 625 10*3/uL — ABNORMAL HIGH (ref 150–440)
RBC: 3.05 10*6/uL — ABNORMAL LOW (ref 4.40–5.90)
WBC: 5.5 10*3/uL (ref 3.8–10.6)

## 2012-01-12 LAB — TROPONIN I: Troponin-I: <0.02 ng/mL

## 2012-01-12 LAB — OSMOLALITY: Osmolality: 265 mOsm/kg — ABNORMAL LOW (ref 280–301)

## 2012-01-12 LAB — COMPREHENSIVE METABOLIC PANEL
Albumin: 4 g/dL (ref 3.4–5.0)
Alkaline Phosphatase: 76 U/L (ref 50–136)
BUN: 10 mg/dL (ref 7–18)
Bilirubin,Total: 0.5 mg/dL (ref 0.2–1.0)
Creatinine: 0.45 mg/dL — ABNORMAL LOW (ref 0.60–1.30)
EGFR (Non-African Amer.): >60
Glucose: 104 mg/dL — ABNORMAL HIGH (ref 65–99)
Osmolality: 253 (ref 275–301)
Potassium: 4.4 mmol/L (ref 3.5–5.1)
SGPT (ALT): 26 U/L (ref 12–78)
Sodium: 126 mmol/L — ABNORMAL LOW (ref 136–145)
Total Protein: 7.6 g/dL (ref 6.4–8.2)

## 2012-01-12 LAB — OSMOLALITY, URINE: Osmolality: 185 mOsm/kg

## 2012-01-12 NOTE — Telephone Encounter (Signed)
Please advise 

## 2012-01-13 ENCOUNTER — Telehealth: Payer: Self-pay | Admitting: Internal Medicine

## 2012-01-13 LAB — CBC WITH DIFFERENTIAL/PLATELET
Basophil %: 0.6 %
Eosinophil #: 0.1 10*3/uL (ref 0.0–0.7)
Eosinophil %: 1.5 %
Lymphocyte #: 0.7 10*3/uL — ABNORMAL LOW (ref 1.0–3.6)
Monocyte #: 0.5 x10 3/mm (ref 0.2–1.0)
Monocyte %: 14.4 %
Neutrophil #: 2.5 10*3/uL (ref 1.4–6.5)
Neutrophil %: 64.7 %
Platelet: 584 10*3/uL — ABNORMAL HIGH (ref 150–440)
RBC: 2.9 10*6/uL — ABNORMAL LOW (ref 4.40–5.90)
WBC: 3.8 10*3/uL (ref 3.8–10.6)

## 2012-01-13 LAB — COMPREHENSIVE METABOLIC PANEL
Albumin: 3.7 g/dL (ref 3.4–5.0)
Alkaline Phosphatase: 77 U/L (ref 50–136)
BUN: 9 mg/dL (ref 7–18)
Chloride: 93 mmol/L — ABNORMAL LOW (ref 98–107)
Co2: 30 mmol/L (ref 21–32)
Creatinine: 0.53 mg/dL — ABNORMAL LOW (ref 0.60–1.30)
EGFR (African American): >60
EGFR (Non-African Amer.): >60
Osmolality: 256 (ref 275–301)
Potassium: 4 mmol/L (ref 3.5–5.1)
SGOT(AST): 22 U/L (ref 15–37)
SGPT (ALT): 23 U/L (ref 12–78)
Total Protein: 6.9 g/dL (ref 6.4–8.2)

## 2012-01-13 LAB — LIPID PANEL
Cholesterol: 137 mg/dL (ref 0–200)
HDL Cholesterol: 60 mg/dL (ref 40–60)
VLDL Cholesterol, Calc: 15 mg/dL (ref 5–40)

## 2012-01-13 LAB — TROPONIN I: Troponin-I: <0.02 ng/mL

## 2012-01-13 NOTE — Telephone Encounter (Signed)
[facility] called Johnny Sanford has been admitted . Family wanted physician informed.

## 2012-01-14 ENCOUNTER — Telehealth: Payer: Self-pay | Admitting: General Practice

## 2012-01-14 ENCOUNTER — Encounter: Payer: Self-pay | Admitting: Internal Medicine

## 2012-01-14 NOTE — Telephone Encounter (Signed)
Called pt on 01/14/12 to follow up from hospital visit. There was no answer left message to have either pt or his wife call back to inform us of pt health. Follow up scheduled for 01/20/12.

## 2012-01-16 NOTE — Telephone Encounter (Signed)
Pt called back was informed

## 2012-01-16 NOTE — Telephone Encounter (Signed)
Called [facility]

## 2012-01-16 NOTE — Telephone Encounter (Signed)
If possible, can they come by on Monday to have labs, CMP, as there may be a delay getting labs back on 12/24.

## 2012-01-16 NOTE — Telephone Encounter (Signed)
Pt wife stated that pt sodium level was to low. That was causing pt confusion, balance issues, made it seem like pt was a stroke like symptoms. . Pt got 1 1/2 bags of IV fluid. Also was placed on an antibiotic due to bronchial infection.   Hospital wants Dr. Dan Humphreys to sodium check at the follow-up appt.

## 2012-01-19 ENCOUNTER — Other Ambulatory Visit (INDEPENDENT_AMBULATORY_CARE_PROVIDER_SITE_OTHER): Payer: Medicare Other

## 2012-01-19 ENCOUNTER — Telehealth: Payer: Self-pay | Admitting: *Deleted

## 2012-01-19 DIAGNOSIS — R5383 Other fatigue: Secondary | ICD-10-CM

## 2012-01-19 DIAGNOSIS — D510 Vitamin B12 deficiency anemia due to intrinsic factor deficiency: Secondary | ICD-10-CM

## 2012-01-19 DIAGNOSIS — R5381 Other malaise: Secondary | ICD-10-CM

## 2012-01-19 LAB — CBC WITH DIFFERENTIAL/PLATELET
Eosinophils Relative: 1.5 % (ref 0.0–5.0)
HCT: 34.1 % — ABNORMAL LOW (ref 39.0–52.0)
Lymphs Abs: 0.9 10*3/uL (ref 0.7–4.0)
MCV: 119.2 fl — ABNORMAL HIGH (ref 78.0–100.0)
Monocytes Absolute: 0.7 10*3/uL (ref 0.1–1.0)
Neutro Abs: 3.2 10*3/uL (ref 1.4–7.7)
Platelets: 684 10*3/uL — ABNORMAL HIGH (ref 150.0–400.0)
WBC: 4.8 10*3/uL (ref 4.5–10.5)

## 2012-01-19 LAB — COMPREHENSIVE METABOLIC PANEL
ALT: 22 U/L (ref 0–53)
Alkaline Phosphatase: 52 U/L (ref 39–117)
Glucose, Bld: 102 mg/dL — ABNORMAL HIGH (ref 70–99)
Sodium: 126 mEq/L — ABNORMAL LOW (ref 135–145)
Total Bilirubin: 0.8 mg/dL (ref 0.3–1.2)
Total Protein: 6.7 g/dL (ref 6.0–8.3)

## 2012-01-19 NOTE — Telephone Encounter (Signed)
Sure fine to add CBC

## 2012-01-19 NOTE — Telephone Encounter (Signed)
The pt asked if a cbc could be added? Thank you

## 2012-01-19 NOTE — Addendum Note (Signed)
Addended by: Montine Circle D on: 01/19/2012 11:43 AM   Modules accepted: Orders

## 2012-01-20 ENCOUNTER — Encounter: Payer: Self-pay | Admitting: Internal Medicine

## 2012-01-20 ENCOUNTER — Ambulatory Visit: Payer: Medicare Other | Admitting: Internal Medicine

## 2012-01-20 ENCOUNTER — Ambulatory Visit (INDEPENDENT_AMBULATORY_CARE_PROVIDER_SITE_OTHER): Payer: Medicare Other | Admitting: Internal Medicine

## 2012-01-20 VITALS — BP 130/82 | HR 83 | Temp 98.3°F | Resp 16 | Wt 181.5 lb

## 2012-01-20 DIAGNOSIS — E2749 Other adrenocortical insufficiency: Secondary | ICD-10-CM

## 2012-01-20 DIAGNOSIS — E2740 Unspecified adrenocortical insufficiency: Secondary | ICD-10-CM

## 2012-01-20 DIAGNOSIS — E871 Hypo-osmolality and hyponatremia: Secondary | ICD-10-CM

## 2012-01-20 DIAGNOSIS — R5383 Other fatigue: Secondary | ICD-10-CM

## 2012-01-20 DIAGNOSIS — D473 Essential (hemorrhagic) thrombocythemia: Secondary | ICD-10-CM

## 2012-01-20 DIAGNOSIS — R5381 Other malaise: Secondary | ICD-10-CM

## 2012-01-20 MED ORDER — FLUDROCORTISONE ACETATE 0.1 MG PO TABS: 0.1000 mg | ORAL_TABLET | Freq: Every day | ORAL | Status: DC

## 2012-01-20 NOTE — Progress Notes (Signed)
Subjective:    Patient ID: Johnny Sanford, male    DOB: May 16, 1940, 71 y.o.   MRN: [id number]  HPI 71 year old male with history of labile hypertension, adrenal insufficiency, essential thrombocythemia, COPD, atherosclerosis presents for followup after recent hospitalization at [facility] for fatigue and confusion. He reports that evaluation was performed including testing for stroke with CT and MRI of the brain. He reports that testing showed no evidence of stroke. During the evaluation, he was noted to be hyponatremic. He was given IV fluids with improvement in sodium level and an overall mentation and fatigue.   Since discharge the last Tuesday, December 17, he reports ongoing symptoms of fatigue. He denies any recurrent episodes of confusion. He reports that his blood pressure has been intermittently low, as low as 80s over 40s. During those episodes, he has taken a salt tablet with some improvement in blood pressure and symptoms of fatigue. He has not had any elevated blood pressure, chest pain, palpitations. He reports full compliance with his medications. He notes that his hematologist also recently increased his hydroxyurea dose to better control his platelet count.  Outpatient Encounter Prescriptions as of 01/20/2012  Medication Sig Dispense Refill  . AFLURIA injection       . ALPRAZolam (XANAX) 0.5 MG tablet Take 0.5 mg by mouth as needed.      Marland Kitchen aspirin 81 MG EC tablet Take 162 mg by mouth daily.       . budesonide-formoterol (SYMBICORT) 160-4.5 MCG/ACT inhaler Inhale 2 puffs into the lungs 2 (two) times daily.  1 Inhaler  3  . Calcium Carb-Cholecalciferol (CALCIUM 1000 + D PO) Take 1 tablet by mouth daily.        . ciprofloxacin-dexamethasone (CIPRODEX) otic suspension Place 4 drops into the right ear 2 (two) times daily.  7.5 mL  0  . Cyanocobalamin (VITAMIN B-12 PO) Take by mouth daily.      . fludrocortisone (FLORINEF) 0.1 MG tablet Take 1 tablet (0.1 mg total) by mouth daily.  90 tablet  3   . fluorouracil (EFUDEX) 5 % cream Apply topically as needed.      . fluticasone (FLONASE) 50 MCG/ACT nasal spray Place 1 spray into the nose daily as needed.  16 g  1  . gabapentin (NEURONTIN) 100 MG capsule Take 1 capsule (100 mg total) by mouth at bedtime.  30 capsule  3  . hydrALAZINE (APRESOLINE) 25 MG tablet Take 1 tablet (25 mg total) by mouth 3 (three) times daily. Use as needed for BP >160/100  90 tablet  3  . hydroxyurea (HYDREA) 500 MG capsule Take 500 mg by mouth as directed. May take with food to minimize GI side effects.       Marland Kitchen levothyroxine (SYNTHROID, LEVOTHROID) 137 MCG tablet Take 1 tablet (137 mcg total) by mouth daily.  90 tablet  3  . OMEGA-3 1000 MG CAPS Take 1 capsule by mouth daily.        Marland Kitchen omeprazole (PRILOSEC) 20 MG capsule Take 1 capsule (20 mg total) by mouth daily.  90 capsule  3  . simvastatin (ZOCOR) 20 MG tablet Take 1 tablet (20 mg total) by mouth daily.  90 tablet  3  . traZODone (DESYREL) 50 MG tablet Take 0.5-1 tablets (25-50 mg total) by mouth at bedtime as needed for sleep.  30 tablet  3  . [DISCONTINUED] fludrocortisone (FLORINEF) 0.1 MG tablet Take 0.5 tablets (0.05 mg total) by mouth daily.  90 tablet  3  . [DISCONTINUED] levofloxacin (LEVAQUIN)  500 MG tablet Take 1 tablet (500 mg total) by mouth daily.  7 tablet  0   BP 130/82  Pulse 83  Temp 98.3 F (36.8 C) (Oral)  Resp 16  Wt 181 lb 8 oz (82.328 kg)  SpO2 95%  Review of Systems  Constitutional: Positive for fatigue. Negative for fever, chills, activity change, appetite change and unexpected weight change.  Eyes: Negative for visual disturbance.  Respiratory: Positive for cough (occasional). Negative for shortness of breath.   Cardiovascular: Negative for chest pain, palpitations and leg swelling.  Gastrointestinal: Negative for abdominal pain and abdominal distention.  Genitourinary: Negative for dysuria, urgency and difficulty urinating.  Musculoskeletal: Negative for arthralgias and gait  problem.  Skin: Negative for color change and rash.  Hematological: Negative for adenopathy.  Psychiatric/Behavioral: Negative for sleep disturbance and dysphoric mood. The patient is not nervous/anxious.        Objective:   Physical Exam  Constitutional: He is oriented to person, place, and time. He appears well-developed and well-nourished. No distress.  HENT:  Head: Normocephalic and atraumatic.  Right Ear: External ear normal.  Left Ear: External ear normal.  Nose: Nose normal.  Mouth/Throat: Oropharynx is clear and moist. No oropharyngeal exudate.  Eyes: Conjunctivae normal and EOM are normal. Pupils are equal, round, and reactive to light. Right eye exhibits no discharge. Left eye exhibits no discharge. No scleral icterus.  Neck: Normal range of motion. Neck supple. No tracheal deviation present. No thyromegaly present.  Cardiovascular: Normal rate, regular rhythm and normal heart sounds.  Exam reveals no gallop and no friction rub.   No murmur heard. Pulmonary/Chest: Effort normal and breath sounds normal. No accessory muscle usage. Not tachypneic. No respiratory distress. He has no wheezes. He has no rales. He exhibits no tenderness.  Musculoskeletal: Normal range of motion. He exhibits no edema.  Lymphadenopathy:    He has no cervical adenopathy.  Neurological: He is alert and oriented to person, place, and time. No cranial nerve deficit. Coordination normal.  Skin: Skin is warm and dry. No rash noted. He is not diaphoretic. No erythema. No pallor.  Psychiatric: He has a normal mood and affect. His behavior is normal. Judgment and thought content normal.          Assessment & Plan:

## 2012-01-20 NOTE — Assessment & Plan Note (Signed)
Suspect ongoing hyponatremia related to adrenal insufficiency. Urine electrolytes, obtained prior to recent hospitalization suggest volume depletion and salt wasting. As below, will set up endocrine evaluation. Will increase dose of Florinef to 0.1 mg daily. He will monitor blood pressure closely at home and call if any low blood pressures less than 90/50. Question if he may ultimately need Florinef increased to 0.2 mg daily and/or addition of hydrocortisone.

## 2012-01-20 NOTE — Assessment & Plan Note (Signed)
Patient was seen and evaluated in the past for adrenal insufficiency, symptomatic with hypotension and fatigue. He was recently hospitalized with significant fatigue, confusion, and found to be hyponatremic. Repeat sodium after his hospitalization continues to be low at 126. He is having episodes of hypotension, as low as 80s over 40s at home. Will try increasing his dose of Florinef to 0.1 mg daily. He will record blood pressure readings and e-mail with results. Will set up a new evaluation with endocrinology. Question if he might benefit from either higher dose of Florinef and/or addition of hydrocortisone. Follow up here in 4 weeks.

## 2012-01-20 NOTE — Assessment & Plan Note (Signed)
Recent labs show improvement in platelet count to 684. Dose of hydroxyurea was recently increased by hematologist. Hemoglobin is stable. We'll plan to repeat blood counts in 4 weeks.

## 2012-01-20 NOTE — Assessment & Plan Note (Signed)
Suspect related to adrenal insufficiency, salt wasting and hyponatremia. Blood counts, thyroid function, kidney and liver function have been stable on labs. As below, will increase florinef and set up endocrine evaluation. Follow up 4 weeks.

## 2012-01-22 LAB — CBC CANCER CENTER
Basophil %: 0.7 %
Eosinophil #: 0.1 x10 3/mm (ref 0.0–0.7)
Eosinophil %: 1.4 %
HCT: 34.9 % — ABNORMAL LOW (ref 40.0–52.0)
Lymphocyte #: 0.6 x10 3/mm — ABNORMAL LOW (ref 1.0–3.6)
MCH: 40 pg — ABNORMAL HIGH (ref 26.0–34.0)
MCHC: 33.9 g/dL (ref 32.0–36.0)
MCV: 118 fL — ABNORMAL HIGH (ref 80–100)
Monocyte %: 12.3 %
Neutrophil %: 70.3 %
Platelet: 581 x10 3/mm — ABNORMAL HIGH (ref 150–440)
RBC: 2.96 10*6/uL — ABNORMAL LOW (ref 4.40–5.90)
WBC: 3.7 x10 3/mm — ABNORMAL LOW (ref 3.8–10.6)

## 2012-01-23 ENCOUNTER — Encounter: Payer: Self-pay | Admitting: Internal Medicine

## 2012-01-28 ENCOUNTER — Ambulatory Visit: Payer: Self-pay | Admitting: Oncology

## 2012-01-29 LAB — CBC CANCER CENTER
Basophil #: 0 x10 3/mm (ref 0.0–0.1)
Eosinophil #: 0.1 x10 3/mm (ref 0.0–0.7)
Eosinophil %: 1.4 %
HGB: 12 g/dL — ABNORMAL LOW (ref 13.0–18.0)
Lymphocyte %: 16.7 %
MCHC: 33.9 g/dL (ref 32.0–36.0)
Monocyte #: 0.7 x10 3/mm (ref 0.2–1.0)
Neutrophil #: 2.8 x10 3/mm (ref 1.4–6.5)
Neutrophil %: 64.6 %
RDW: 13.6 % (ref 11.5–14.5)
WBC: 4.4 x10 3/mm (ref 3.8–10.6)

## 2012-02-04 LAB — CBC CANCER CENTER
Eosinophil #: 0.1 x10 3/mm (ref 0.0–0.7)
HGB: 11.9 g/dL — ABNORMAL LOW (ref 13.0–18.0)
MCH: 40.1 pg — ABNORMAL HIGH (ref 26.0–34.0)
MCV: 117 fL — ABNORMAL HIGH (ref 80–100)
Neutrophil #: 2.2 x10 3/mm (ref 1.4–6.5)
Platelet: 620 x10 3/mm — ABNORMAL HIGH (ref 150–440)
RDW: 13.7 % (ref 11.5–14.5)
WBC: 3.7 x10 3/mm — ABNORMAL LOW (ref 3.8–10.6)

## 2012-02-13 ENCOUNTER — Encounter: Payer: Self-pay | Admitting: Internal Medicine

## 2012-02-13 ENCOUNTER — Ambulatory Visit (INDEPENDENT_AMBULATORY_CARE_PROVIDER_SITE_OTHER): Payer: Medicare Other | Admitting: Internal Medicine

## 2012-02-13 VITALS — BP 108/80 | HR 81 | Temp 98.1°F | Wt 190.0 lb

## 2012-02-13 DIAGNOSIS — D473 Essential (hemorrhagic) thrombocythemia: Secondary | ICD-10-CM

## 2012-02-13 DIAGNOSIS — E871 Hypo-osmolality and hyponatremia: Secondary | ICD-10-CM

## 2012-02-13 DIAGNOSIS — E2749 Other adrenocortical insufficiency: Secondary | ICD-10-CM

## 2012-02-13 DIAGNOSIS — R5383 Other fatigue: Secondary | ICD-10-CM

## 2012-02-13 DIAGNOSIS — E2740 Unspecified adrenocortical insufficiency: Secondary | ICD-10-CM

## 2012-02-13 NOTE — Assessment & Plan Note (Signed)
Recently evaluated by endocrinologist. Recommended continuing current dose of Florinef. We'll continue to monitor sodium and blood pressure closely. Followup in 6 weeks.

## 2012-02-13 NOTE — Assessment & Plan Note (Signed)
Patient reports recent sodium level was improved at 129. Will continue to monitor. Plan to repeat lab work in 6 weeks.

## 2012-02-13 NOTE — Assessment & Plan Note (Signed)
Symptoms have improved since last visit. Symptoms likely multifactorial with ongoing issues with adrenal insufficiency, rheumatoid arthritis, COPD, and thrombocythemia. Will continue to monitor. Followup in 6 weeks.

## 2012-02-13 NOTE — Assessment & Plan Note (Signed)
Patient reports that recent platelet count was elevated. He has followup with hematologist next week. Plan is to repeat lab work and platelet persistently elevated pursue bone marrow biopsy. Will follow.

## 2012-02-13 NOTE — Progress Notes (Signed)
Subjective:    Patient ID: Johnny Sanford, male    DOB: Apr 06, 1940, 72 y.o.   MRN: [id number]  HPI 72 year old male with history of labile hypertension, adrenal insufficiency, rheumatoid arthritis, essential thrombocythemia presents for followup. In the interim since his last visit, he underwent endocrine evaluation with further testing for adrenal insufficiency. Endocrinologist recommended continuing the Florinef with no change in dose. She recommended against starting hydrocortisone. Patient reports that endocrinologist recheck labs and found that sodium level was improved at 129. He reports that overall symptoms of fatigue have been improving. He also notes that he was seen by his hematologist in the interim since his last visit and noted to have increased in his platelet count. He is scheduled for followup next week. If platelet count continues to increase despite use of hydroxyurea, he will be scheduled for bone marrow biopsy. He denies any new concerns today.  Outpatient Encounter Prescriptions as of 02/13/2012  Medication Sig Dispense Refill  . ALPRAZolam (XANAX) 0.5 MG tablet Take 0.5 mg by mouth as needed.      Marland Kitchen aspirin 81 MG EC tablet Take 162 mg by mouth daily.       . budesonide-formoterol (SYMBICORT) 160-4.5 MCG/ACT inhaler Inhale 2 puffs into the lungs 2 (two) times daily.  1 Inhaler  3  . Calcium Carb-Cholecalciferol (CALCIUM 1000 + D PO) Take 1 tablet by mouth daily.        . Cyanocobalamin (VITAMIN B-12 PO) Take by mouth daily.      . fludrocortisone (FLORINEF) 0.1 MG tablet Take 1 tablet (0.1 mg total) by mouth daily.  90 tablet  3  . fluorouracil (EFUDEX) 5 % cream Apply topically as needed.      . fluticasone (FLONASE) 50 MCG/ACT nasal spray Place 1 spray into the nose daily as needed.  16 g  1  . hydrALAZINE (APRESOLINE) 25 MG tablet Take 1 tablet (25 mg total) by mouth 3 (three) times daily. Use as needed for BP >160/100  90 tablet  3  . hydroxyurea (HYDREA) 500 MG capsule Take  500 mg by mouth as directed. May take with food to minimize GI side effects.       Marland Kitchen levothyroxine (SYNTHROID, LEVOTHROID) 137 MCG tablet Take 1 tablet (137 mcg total) by mouth daily.  90 tablet  3  . OMEGA-3 1000 MG CAPS Take 1 capsule by mouth daily.        Marland Kitchen omeprazole (PRILOSEC) 20 MG capsule Take 1 capsule (20 mg total) by mouth daily.  90 capsule  3  . simvastatin (ZOCOR) 20 MG tablet Take 1 tablet (20 mg total) by mouth daily.  90 tablet  3  . traZODone (DESYREL) 50 MG tablet Take 0.5-1 tablets (25-50 mg total) by mouth at bedtime as needed for sleep.  30 tablet  3  . gabapentin (NEURONTIN) 100 MG capsule Take 1 capsule (100 mg total) by mouth at bedtime.  30 capsule  3   BP 108/80  Pulse 81  Temp 98.1 F (36.7 C) (Oral)  Wt 190 lb (86.183 kg)  SpO2 98%  Review of Systems  Constitutional: Negative for fever, chills, activity change, appetite change, fatigue and unexpected weight change.  Eyes: Negative for visual disturbance.  Respiratory: Negative for cough and shortness of breath.   Cardiovascular: Negative for chest pain, palpitations and leg swelling.  Gastrointestinal: Negative for abdominal pain and abdominal distention.  Genitourinary: Negative for dysuria, urgency and difficulty urinating.  Musculoskeletal: Positive for back pain. Negative for arthralgias and  gait problem.  Skin: Negative for color change and rash.  Hematological: Negative for adenopathy.  Psychiatric/Behavioral: Negative for sleep disturbance and dysphoric mood. The patient is not nervous/anxious.        Objective:   Physical Exam  Constitutional: He is oriented to person, place, and time. He appears well-developed and well-nourished. No distress.  HENT:  Head: Normocephalic and atraumatic.  Right Ear: External ear normal.  Left Ear: External ear normal.  Nose: Nose normal.  Mouth/Throat: Oropharynx is clear and moist. No oropharyngeal exudate.  Eyes: Conjunctivae normal and EOM are normal.  Pupils are equal, round, and reactive to light. Right eye exhibits no discharge. Left eye exhibits no discharge. No scleral icterus.  Neck: Normal range of motion. Neck supple. No tracheal deviation present. No thyromegaly present.  Cardiovascular: Normal rate, regular rhythm and normal heart sounds.  Exam reveals no gallop and no friction rub.   No murmur heard. Pulmonary/Chest: Effort normal and breath sounds normal. No accessory muscle usage. Not tachypneic. No respiratory distress. He has no decreased breath sounds. He has no wheezes. He has no rhonchi. He has no rales. He exhibits no tenderness.  Musculoskeletal: Normal range of motion. He exhibits no edema.  Lymphadenopathy:    He has no cervical adenopathy.  Neurological: He is alert and oriented to person, place, and time. No cranial nerve deficit. Coordination normal.  Skin: Skin is warm and dry. No rash noted. He is not diaphoretic. No erythema. No pallor.  Psychiatric: He has a normal mood and affect. His behavior is normal. Judgment and thought content normal.          Assessment & Plan:

## 2012-02-18 LAB — CBC CANCER CENTER
Basophil #: 0 x10 3/mm (ref 0.0–0.1)
Basophil %: 0.9 %
HCT: 35.8 % — ABNORMAL LOW (ref 40.0–52.0)
HGB: 12.2 g/dL — ABNORMAL LOW (ref 13.0–18.0)
Lymphocyte %: 21.2 %
MCH: 40.2 pg — ABNORMAL HIGH (ref 26.0–34.0)
MCHC: 34.2 g/dL (ref 32.0–36.0)
MCV: 117 fL — ABNORMAL HIGH (ref 80–100)
Monocyte #: 0.6 x10 3/mm (ref 0.2–1.0)
RBC: 3.05 10*6/uL — ABNORMAL LOW (ref 4.40–5.90)
RDW: 13.7 % (ref 11.5–14.5)

## 2012-02-19 ENCOUNTER — Telehealth: Payer: Self-pay | Admitting: Internal Medicine

## 2012-02-19 NOTE — Telephone Encounter (Signed)
Cosyntropin stim test normal

## 2012-02-28 ENCOUNTER — Ambulatory Visit: Payer: Self-pay | Admitting: Oncology

## 2012-03-10 LAB — CBC CANCER CENTER
Basophil #: 0 x10 3/mm (ref 0.0–0.1)
Eosinophil #: 0.1 x10 3/mm (ref 0.0–0.7)
Eosinophil %: 1.8 %
HCT: 35.9 % — ABNORMAL LOW (ref 40.0–52.0)
HGB: 12.3 g/dL — ABNORMAL LOW (ref 13.0–18.0)
Lymphocyte %: 19 %
MCH: 40.5 pg — ABNORMAL HIGH (ref 26.0–34.0)
MCHC: 34.4 g/dL (ref 32.0–36.0)
MCV: 118 fL — ABNORMAL HIGH (ref 80–100)
Monocyte #: 0.5 x10 3/mm (ref 0.2–1.0)
Monocyte %: 15 %
Platelet: 486 x10 3/mm — ABNORMAL HIGH (ref 150–440)
RBC: 3.05 10*6/uL — ABNORMAL LOW (ref 4.40–5.90)
WBC: 3.5 x10 3/mm — ABNORMAL LOW (ref 3.8–10.6)

## 2012-03-27 ENCOUNTER — Ambulatory Visit: Payer: Self-pay | Admitting: Oncology

## 2012-03-29 ENCOUNTER — Ambulatory Visit: Payer: Medicare Other | Admitting: Internal Medicine

## 2012-03-30 ENCOUNTER — Encounter: Payer: Self-pay | Admitting: Internal Medicine

## 2012-03-31 ENCOUNTER — Encounter: Payer: Self-pay | Admitting: Internal Medicine

## 2012-03-31 ENCOUNTER — Ambulatory Visit (INDEPENDENT_AMBULATORY_CARE_PROVIDER_SITE_OTHER): Payer: Medicare Other | Admitting: Internal Medicine

## 2012-03-31 VITALS — BP 150/100 | HR 90 | Temp 98.1°F | Wt 191.0 lb

## 2012-03-31 LAB — CBC CANCER CENTER
Basophil #: 0 x10 3/mm (ref 0.0–0.1)
Basophil %: 1 %
Eosinophil %: 1.1 %
HCT: 36 % — ABNORMAL LOW (ref 40.0–52.0)
Lymphocyte #: 0.6 x10 3/mm — ABNORMAL LOW (ref 1.0–3.6)
Lymphocyte %: 18.3 %
MCH: 39.6 pg — ABNORMAL HIGH (ref 26.0–34.0)
MCHC: 34 g/dL (ref 32.0–36.0)
Monocyte %: 18.3 %
Neutrophil %: 61.3 %
Platelet: 759 x10 3/mm — ABNORMAL HIGH (ref 150–440)
RDW: 13.9 % (ref 11.5–14.5)
WBC: 3.5 x10 3/mm — ABNORMAL LOW (ref 3.8–10.6)

## 2012-03-31 LAB — COMPREHENSIVE METABOLIC PANEL
ALT: 31 U/L (ref 0–53)
AST: 36 U/L (ref 0–37)
Albumin: 3.9 g/dL (ref 3.5–5.2)
Alkaline Phosphatase: 62 U/L (ref 39–117)
Calcium: 10 mg/dL (ref 8.4–10.5)
Chloride: 90 mEq/L — ABNORMAL LOW (ref 96–112)
Potassium: 4.5 mEq/L (ref 3.5–5.3)
Sodium: 128 mEq/L — ABNORMAL LOW (ref 135–145)
Total Protein: 7.1 g/dL (ref 6.0–8.3)

## 2012-03-31 NOTE — Assessment & Plan Note (Signed)
Unclear etiology for brief stay of confusion. Symptoms have now resolved. Electrolytes checked today showed only mild hyponatremia. He was recently taken off of hydroxyurea because of increased platelet count. Question if his symptoms may be related to change in medication. No focal symptoms to suggest stroke however symptoms may be suggestive of TIA. For now, we'll continue to monitor. If any recurrent symptoms, consider repeat MRI brain, however recent MRI brain normal.

## 2012-03-31 NOTE — Assessment & Plan Note (Signed)
Platelet count recently worsened despite use of hydroxyurea. Patient reports he was changed to an alternative medication. Work was notes from his oncologist. He is scheduled for bone marrow biopsy next week.

## 2012-03-31 NOTE — Progress Notes (Signed)
Subjective:    Patient ID: Johnny Sanford, male    DOB: April 10, 1940, 72 y.o.   MRN: [id number]  HPI 72 year old male with history of adrenal insufficiency, essential thrombocythemia, COPD, labile blood pressure presents for acute visit complaining of brief episode of confusion. His wife reports that they were sitting at home last night when he suddenly seemed to be confused. However, he was able to converse with her. He requested a salt tablet with this he felt that his blood pressure was low. He took a salt tablet and within a few seconds was feeling better. He did not have any loss of consciousness, slurred speech, change in vision, focal symptoms such as numbness. He reports that the symptoms he felt of confusion are similar to previous episodes when he had low sodium level. He notes that his oncologist recently stopped his hydroxyurea because his platelet count was rapidly climbing. He is scheduled for bone marrow biopsy next week. He denies any recent fever, chills, cough, symptoms of upper respiratory infection, dysuria, flank pain, change in bowel habits. He reports that during this episode and recently his blood pressure has been normal, typically 130 to 150/80-100. He denies any chest pain or palpitations.  His wife notes that over the last few months he seems to be losing some short and long-term memory. He occasionally has asked for directions when driving. It is unusual for him. 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  Outpatient Encounter Prescriptions as of 03/31/2012  Medication Sig Dispense Refill  . ALPRAZolam (XANAX) 0.5 MG tablet Take 0.5 mg by mouth as needed.      Marland Kitchen aspirin 81 MG EC tablet Take 162 mg by mouth daily.       . budesonide-formoterol (SYMBICORT) 160-4.5 MCG/ACT inhaler Inhale 2 puffs into the lungs 2 (two) times daily.  1 Inhaler  3  . Calcium Carb-Cholecalciferol (CALCIUM 1000 + D PO) Take 1 tablet by mouth daily.        .  Cyanocobalamin (VITAMIN B-12 PO) Take by mouth daily.      . fludrocortisone (FLORINEF) 0.1 MG tablet Take 1 tablet (0.1 mg total) by mouth daily.  90 tablet  3  . fluorouracil (EFUDEX) 5 % cream Apply topically as needed.      . fluticasone (FLONASE) 50 MCG/ACT nasal spray Place 1 spray into the nose daily as needed.  16 g  1  . gabapentin (NEURONTIN) 100 MG capsule Take 1 capsule (100 mg total) by mouth at bedtime.  30 capsule  3  . hydrALAZINE (APRESOLINE) 25 MG tablet Take 1 tablet (25 mg total) by mouth 3 (three) times daily. Use as needed for BP >160/100  90 tablet  3  . levothyroxine (SYNTHROID, LEVOTHROID) 137 MCG tablet Take 1 tablet (137 mcg total) by mouth daily.  90 tablet  3  . Misc Natural Products (GINSENG COMPLEX PO) Take by mouth.      . OMEGA-3 1000 MG CAPS Take 1 capsule by mouth daily.        Marland Kitchen omeprazole (PRILOSEC) 20 MG capsule Take 1 capsule (20 mg total) by mouth daily.  90 capsule  3  . simvastatin (ZOCOR) 20 MG tablet Take 1 tablet (20 mg total) by mouth daily.  90 tablet  3  . traZODone (DESYREL) 50 MG tablet Take 0.5-1 tablets (25-50 mg total) by mouth at bedtime as needed for sleep.  30 tablet  3  . mometasone (ELOCON) 0.1 % lotion       . [  DISCONTINUED] hydroxyurea (HYDREA) 500 MG capsule Take 500 mg by mouth as directed. May take with food to minimize GI side effects.        No facility-administered encounter medications on file as of 03/31/2012.   BP 150/100  Pulse 90  Temp(Src) 98.1 F (36.7 C) (Oral)  Wt 191 lb (86.637 kg)  BMI 31.78 kg/m2  SpO2 95%  Review of Systems  Constitutional: Negative for fever, chills, activity change, appetite change, fatigue and unexpected weight change.  Eyes: Negative for visual disturbance.  Respiratory: Negative for cough and shortness of breath.   Cardiovascular: Negative for chest pain, palpitations and leg swelling.  Gastrointestinal: Negative for abdominal pain and abdominal distention.  Genitourinary: Negative for  dysuria, urgency and difficulty urinating.  Musculoskeletal: Negative for arthralgias and gait problem.  Skin: Negative for color change and rash.  Hematological: Negative for adenopathy.  Psychiatric/Behavioral: Negative for sleep disturbance and dysphoric mood. The patient is not nervous/anxious.        Objective:   Physical Exam  Constitutional: He is oriented to person, place, and time. He appears well-developed and well-nourished. No distress.  HENT:  Head: Normocephalic and atraumatic.  Right Ear: External ear normal.  Left Ear: External ear normal.  Nose: Nose normal.  Mouth/Throat: Oropharynx is clear and moist. No oropharyngeal exudate.  Eyes: Conjunctivae and EOM are normal. Pupils are equal, round, and reactive to light. Right eye exhibits no discharge. Left eye exhibits no discharge. No scleral icterus.  Neck: Normal range of motion. Neck supple. No tracheal deviation present. No thyromegaly present.  Cardiovascular: Normal rate, regular rhythm and normal heart sounds.  Exam reveals no gallop and no friction rub.   No murmur heard. Pulmonary/Chest: Effort normal and breath sounds normal. No accessory muscle usage. Not tachypneic. No respiratory distress. He has no decreased breath sounds. He has no wheezes. He has no rhonchi. He has no rales. He exhibits no tenderness.  Musculoskeletal: Normal range of motion. He exhibits no edema.  Lymphadenopathy:    He has no cervical adenopathy.  Neurological: He is alert and oriented to person, place, and time. No cranial nerve deficit. Coordination normal.  Skin: Skin is warm and dry. No rash noted. He is not diaphoretic. No erythema. No pallor.  Psychiatric: He has a normal mood and affect. His behavior is normal. Judgment and thought content normal.          Assessment & Plan:

## 2012-03-31 NOTE — Assessment & Plan Note (Signed)
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 Will set up cognitive testing. Question if he might benefit from Namenda or Aricept.

## 2012-03-31 NOTE — Assessment & Plan Note (Signed)
Na today stable. Will continue to monitor.

## 2012-04-01 ENCOUNTER — Ambulatory Visit: Payer: Medicare Other | Admitting: Internal Medicine

## 2012-04-05 ENCOUNTER — Ambulatory Visit: Payer: Medicare Other | Admitting: Internal Medicine

## 2012-04-09 ENCOUNTER — Encounter: Payer: Self-pay | Admitting: Internal Medicine

## 2012-04-09 ENCOUNTER — Ambulatory Visit (INDEPENDENT_AMBULATORY_CARE_PROVIDER_SITE_OTHER): Payer: Medicare Other | Admitting: Internal Medicine

## 2012-04-09 VITALS — BP 140/96 | HR 92 | Temp 98.1°F | Ht 65.0 in | Wt 186.0 lb

## 2012-04-09 DIAGNOSIS — R413 Other amnesia: Secondary | ICD-10-CM

## 2012-04-09 LAB — CBC CANCER CENTER
Bands: 3 %
Basophil: 2 %
Eosinophil: 1 %
HCT: 36.5 % — ABNORMAL LOW (ref 40.0–52.0)
MCH: 40.1 pg — ABNORMAL HIGH (ref 26.0–34.0)
MCHC: 34.2 g/dL (ref 32.0–36.0)
MCV: 117 fL — ABNORMAL HIGH (ref 80–100)
Monocytes: 21 %
Platelet: 727 x10 3/mm — ABNORMAL HIGH (ref 150–440)
RBC: 3.12 10*6/uL — ABNORMAL LOW (ref 4.40–5.90)
RDW: 13.4 % (ref 11.5–14.5)
Segmented Neutrophils: 61 %
WBC: 4.8 x10 3/mm (ref 3.8–10.6)

## 2012-04-09 MED ORDER — OXYCODONE-ACETAMINOPHEN 5-325 MG PO TABS: 1.0000 | ORAL_TABLET | Freq: Three times a day (TID) | ORAL | Status: DC | PRN

## 2012-04-09 NOTE — Assessment & Plan Note (Signed)
Symptoms stable. Evaluation scheduled for next week. Will have patient followup here in 4 weeks.

## 2012-04-09 NOTE — Assessment & Plan Note (Signed)
  BP Readings from Last 3 Encounters:  04/09/12 140/96  03/31/12 150/100  02/13/12 108/80   Blood pressure quite labile. Chronic and ongoing for years. Related to adrenal insufficiency. Will continue salt supplementation as needed, Florinef, and hydralazine as needed for blood pressure greater than 160/100.

## 2012-04-09 NOTE — Progress Notes (Signed)
Subjective:    Patient ID: Johnny Sanford, male    DOB: [birth year], 72 y.o.   MRN: [id number]  HPI 72 year old male with history of adrenal insufficiency, labile hypertension, hypothyroidism, hyponatremia, peripheral vascular disease, and essential thrombocythemia presents for followup. He was seen by oncologist today and had bone marrow biopsy was performed given persistent thrombocythemia. He was changed from hydroxyurea to anagrelide. He denies any side effects with this medication. He notes some pain at site of BMBx.  He reports he is generally been feeling well. His blood pressure continues to be labile and if he begins to feel lightheaded or notices blood pressure less than 140/90 he generally takes a salt tablet with some improvement. He continues on Florinef. He has not recently needed to use hydralazine for blood pressure greater than 160/100. He denies any headache, chest pain, palpitations.  Outpatient Encounter Prescriptions as of 04/09/2012  Medication Sig Dispense Refill  . ALPRAZolam (XANAX) 0.5 MG tablet Take 0.5 mg by mouth as needed.      Marland Kitchen anagrelide (AGRYLIN) 0.5 MG capsule       . aspirin 81 MG EC tablet Take 162 mg by mouth daily.       . budesonide-formoterol (SYMBICORT) 160-4.5 MCG/ACT inhaler Inhale 2 puffs into the lungs 2 (two) times daily.  1 Inhaler  3  . Calcium Carb-Cholecalciferol (CALCIUM 1000 + D PO) Take 1 tablet by mouth daily.        . Cyanocobalamin (VITAMIN B-12 PO) Take by mouth daily.      . fludrocortisone (FLORINEF) 0.1 MG tablet Take 1 tablet (0.1 mg total) by mouth daily.  90 tablet  3  . fluorouracil (EFUDEX) 5 % cream Apply topically as needed.      . fluticasone (FLONASE) 50 MCG/ACT nasal spray Place 1 spray into the nose daily as needed.  16 g  1  . gabapentin (NEURONTIN) 100 MG capsule Take 1 capsule (100 mg total) by mouth at bedtime.  30 capsule  3  . hydrALAZINE (APRESOLINE) 25 MG tablet Take 1 tablet (25 mg total) by mouth 3 (three) times daily.  Use as needed for BP >160/100  90 tablet  3  . levothyroxine (SYNTHROID, LEVOTHROID) 137 MCG tablet Take 1 tablet (137 mcg total) by mouth daily.  90 tablet  3  . Misc Natural Products (GINSENG COMPLEX PO) Take by mouth.      . mometasone (ELOCON) 0.1 % lotion       . OMEGA-3 1000 MG CAPS Take 1 capsule by mouth daily.        Marland Kitchen omeprazole (PRILOSEC) 20 MG capsule Take 1 capsule (20 mg total) by mouth daily.  90 capsule  3  . simvastatin (ZOCOR) 20 MG tablet Take 1 tablet (20 mg total) by mouth daily.  90 tablet  3  . traZODone (DESYREL) 50 MG tablet Take 0.5-1 tablets (25-50 mg total) by mouth at bedtime as needed for sleep.  30 tablet  3  . oxyCODONE-acetaminophen (ROXICET) 5-325 MG per tablet Take 1 tablet by mouth every 8 (eight) hours as needed for pain.  20 tablet  0   No facility-administered encounter medications on file as of 04/09/2012.   BP 140/96  Pulse 92  Temp(Src) 98.1 F (36.7 C) (Oral)  Ht 5\' 5"  (1.651 m)  Wt 186 lb (84.369 kg)  BMI 30.95 kg/m2  SpO2 95%  Review of Systems  Constitutional: Negative for fever, chills, activity change, appetite change, fatigue and unexpected weight change.  Eyes: Negative  for visual disturbance.  Respiratory: Negative for cough and shortness of breath.   Cardiovascular: Negative for chest pain, palpitations and leg swelling.  Gastrointestinal: Negative for abdominal pain and abdominal distention.  Genitourinary: Negative for dysuria, urgency and difficulty urinating.  Musculoskeletal: Negative for arthralgias and gait problem.  Skin: Negative for color change and rash.  Hematological: Negative for adenopathy.  Psychiatric/Behavioral: Negative for sleep disturbance and dysphoric mood. The patient is not nervous/anxious.        Objective:   Physical Exam  Constitutional: He is oriented to person, place, and time. He appears well-developed and well-nourished. No distress.  HENT:  Head: Normocephalic and atraumatic.  Right Ear:  External ear normal.  Left Ear: External ear normal.  Nose: Nose normal.  Mouth/Throat: Oropharynx is clear and moist. No oropharyngeal exudate.  Eyes: Conjunctivae and EOM are normal. Pupils are equal, round, and reactive to light. Right eye exhibits no discharge. Left eye exhibits no discharge. No scleral icterus.  Neck: Normal range of motion. Neck supple. No tracheal deviation present. No thyromegaly present.  Cardiovascular: Normal rate, regular rhythm and normal heart sounds.  Exam reveals no gallop and no friction rub.   No murmur heard. Pulmonary/Chest: Effort normal and breath sounds normal. No respiratory distress. He has no wheezes. He has no rales. He exhibits no tenderness.  Musculoskeletal: Normal range of motion. He exhibits no edema.  Lymphadenopathy:    He has no cervical adenopathy.  Neurological: He is alert and oriented to person, place, and time. No cranial nerve deficit. Coordination normal.  Skin: Skin is warm and dry. No rash noted. He is not diaphoretic. No erythema. No pallor.  Psychiatric: He has a normal mood and affect. His behavior is normal. Judgment and thought content normal.          Assessment & Plan:

## 2012-04-09 NOTE — Assessment & Plan Note (Signed)
Recent sodium was 128 which is stable for him. Hyponatremia related to adrenal insufficiency. We'll plan to recheck labs in 4 weeks.

## 2012-04-09 NOTE — Assessment & Plan Note (Signed)
Persistent thrombocytosis despite use of hydroxyurea. Patient is now on anagrelide. He is status post bone marrow biopsy today and results are pending. He will followup here in one month. Prescription for Percocet given for post procedure pain to use prn.

## 2012-04-21 ENCOUNTER — Other Ambulatory Visit: Payer: Self-pay | Admitting: *Deleted

## 2012-04-21 LAB — CBC CANCER CENTER
Basophil #: 0.1 x10 3/mm (ref 0.0–0.1)
Eosinophil #: 0.1 x10 3/mm (ref 0.0–0.7)
Lymphocyte #: 1 x10 3/mm (ref 1.0–3.6)
Lymphocyte %: 18.1 %
MCHC: 34.2 g/dL (ref 32.0–36.0)
Monocyte #: 0.8 x10 3/mm (ref 0.2–1.0)
Monocyte %: 14.3 %
Neutrophil #: 3.8 x10 3/mm (ref 1.4–6.5)
Neutrophil %: 65.5 %
Platelet: 952 x10 3/mm — ABNORMAL HIGH (ref 150–440)
RDW: 13 % (ref 11.5–14.5)
WBC: 5.8 x10 3/mm (ref 3.8–10.6)

## 2012-04-21 MED ORDER — ALPRAZOLAM 0.5 MG PO TABS: 0.5000 mg | ORAL_TABLET | ORAL | Status: DC | PRN

## 2012-04-21 NOTE — Telephone Encounter (Signed)
Patient would like to go to pain clinic, but not to Dr. Sheila Oats because of all the problem he has had with him. He would like a referral to [facility] in the hospital with Dr. Metta Clines. Also requesting a refill on his Alprazolam.

## 2012-04-21 NOTE — Telephone Encounter (Signed)
Rx printed, signed and faxed to pharmacy. 

## 2012-04-21 NOTE — Telephone Encounter (Signed)
I would like to discuss this with him at his next visit first.

## 2012-04-21 NOTE — Telephone Encounter (Signed)
Spoke with patient he would like to be seen before 4/24 to discuss this. Patient given appt for next week.

## 2012-04-26 ENCOUNTER — Telehealth: Payer: Self-pay | Admitting: Internal Medicine

## 2012-04-26 NOTE — Telephone Encounter (Signed)
Received notes from Dr. Letta Moynahan. Testing showed "mild cognitive impairment." We can discuss this further at his next visit.

## 2012-04-26 NOTE — Telephone Encounter (Signed)
Patient informed and verbally agreed.  

## 2012-04-27 ENCOUNTER — Ambulatory Visit: Payer: Self-pay | Admitting: Oncology

## 2012-04-28 ENCOUNTER — Encounter: Payer: Self-pay | Admitting: Internal Medicine

## 2012-04-30 ENCOUNTER — Ambulatory Visit (INDEPENDENT_AMBULATORY_CARE_PROVIDER_SITE_OTHER): Payer: Medicare Other | Admitting: Internal Medicine

## 2012-04-30 ENCOUNTER — Encounter: Payer: Self-pay | Admitting: Internal Medicine

## 2012-04-30 VITALS — BP 160/100 | HR 92 | Temp 98.5°F | Wt 188.0 lb

## 2012-04-30 DIAGNOSIS — M545 Low back pain: Secondary | ICD-10-CM

## 2012-04-30 DIAGNOSIS — E2740 Unspecified adrenocortical insufficiency: Secondary | ICD-10-CM

## 2012-04-30 DIAGNOSIS — I10 Essential (primary) hypertension: Secondary | ICD-10-CM

## 2012-04-30 DIAGNOSIS — E2749 Other adrenocortical insufficiency: Secondary | ICD-10-CM

## 2012-04-30 DIAGNOSIS — E871 Hypo-osmolality and hyponatremia: Secondary | ICD-10-CM

## 2012-04-30 DIAGNOSIS — R413 Other amnesia: Secondary | ICD-10-CM

## 2012-04-30 LAB — COMPREHENSIVE METABOLIC PANEL
BUN: 10 mg/dL (ref 6–23)
CO2: 28 mEq/L (ref 19–32)
Calcium: 8.8 mg/dL (ref 8.4–10.5)
Creatinine, Ser: 0.7 mg/dL (ref 0.4–1.5)
GFR: 122.25 mL/min (ref >60.00)
Potassium: 4.3 mEq/L (ref 3.5–5.1)
Sodium: 125 mEq/L — ABNORMAL LOW (ref 135–145)
Total Bilirubin: 0.4 mg/dL (ref 0.3–1.2)

## 2012-04-30 MED ORDER — FLUTICASONE PROPIONATE 50 MCG/ACT NA SUSP: 1.0000 | Freq: Every day | NASAL | Status: DC | PRN

## 2012-04-30 MED ORDER — AMLODIPINE BESYLATE 2.5 MG PO TABS: 2.5000 mg | ORAL_TABLET | Freq: Every day | ORAL | Status: DC

## 2012-04-30 MED ORDER — ALPRAZOLAM 0.5 MG PO TABS: 0.5000 mg | ORAL_TABLET | Freq: Three times a day (TID) | ORAL | Status: DC | PRN

## 2012-04-30 MED ORDER — FLUDROCORTISONE ACETATE 0.1 MG PO TABS: 0.0500 mg | ORAL_TABLET | Freq: Every day | ORAL | Status: AC

## 2012-04-30 NOTE — Assessment & Plan Note (Addendum)
Extremely labile BP, running 80s/50s to 170s/120s.  Using prn Hydralazine, however having some resulting hypotension with this. Trend overall in BP recently higher. Will try adding low dose Amlodipine 2.5mg  daily. Pt will monitor BP closely. Will stop medication if any hypotension. Will set up cardiology and endocrinology followup.  Over of which 50% spent in face-to-face contact with patient discussing overall plan for managing labile HTN, adrenal insufficiency, back pain, and memory loss.

## 2012-04-30 NOTE — Assessment & Plan Note (Signed)
Chronic low back pain. No improvement with recent epidural steroid injections. Until acute issues with BP resolved, will hold off on any additional interventions.

## 2012-04-30 NOTE — Assessment & Plan Note (Signed)
Persistent symptoms of intermittent hypotension with hyponatremia noted on labs. Will set up follow up with endocrinology. Question if he should start hydrocortisone.

## 2012-04-30 NOTE — Progress Notes (Signed)
Subjective:    Patient ID: Johnny Sanford, male    DOB: November 14, 1940, 72 y.o.   MRN: [id number]  HPI 72 year old male with history of labile hypertension, adrenal insufficiency, chronic low back pain, and memory loss presents for followup. He has several concerns today. He reports that blood pressure has been quite labile, ranging from 80s over 50s to 170s over 120s. During episodes of low blood pressure, he typically takes a salt tablet with some improvement. For elevated blood pressure, he has been using hydralazine however after using hydralazine he has had some hypotension. He notes ongoing fatigue and some intermittent confusion. He denies any recent change in diet, or recent illnesses. He denies any chest pain, shortness of breath, palpitations. He notes that his oncologist recently increased his dose of anagrelide. He questions whether he may need to stop Florinef.  In regards to low back pain, he reports no improvement after epidural steroid injections. He would like to return to see previous pain management specialist. He feels that the dose he received of steroid on recent injections was excessive. Pain is described as aching in the lower back that doesn't generally radiate. He is not taking any pain medication on a regular basis for this.  In regards to recent evaluation for memory loss, we reviewed records from cognitive testing which showed mild cognitive impairment. He does not currently wish to start medication such as Aricept or Namenda because of potential side effects. His wife notes ongoing issues with short-term memory.  Outpatient Encounter Prescriptions as of 04/30/2012  Medication Sig Dispense Refill  . ALPRAZolam (XANAX) 0.5 MG tablet Take 1 tablet (0.5 mg total) by mouth 3 (three) times daily as needed for anxiety.  90 tablet  3  . anagrelide (AGRYLIN) 0.5 MG capsule       . aspirin 81 MG EC tablet Take 162 mg by mouth daily.       . budesonide-formoterol (SYMBICORT) 160-4.5  MCG/ACT inhaler Inhale 2 puffs into the lungs 2 (two) times daily.  1 Inhaler  3  . Calcium Carb-Cholecalciferol (CALCIUM 1000 + D PO) Take 1 tablet by mouth daily.        . Cyanocobalamin (VITAMIN B-12 PO) Take by mouth daily.      . fludrocortisone (FLORINEF) 0.1 MG tablet Take 0.5 tablets (0.05 mg total) by mouth daily.  90 tablet  3  . fluorouracil (EFUDEX) 5 % cream Apply topically as needed.      . fluticasone (FLONASE) 50 MCG/ACT nasal spray Place 1 spray into the nose daily as needed for rhinitis.  16 g  3  . gabapentin (NEURONTIN) 100 MG capsule Take 1 capsule (100 mg total) by mouth at bedtime.  30 capsule  3  . hydrALAZINE (APRESOLINE) 25 MG tablet Take 1 tablet (25 mg total) by mouth 3 (three) times daily. Use as needed for BP >160/100  90 tablet  3  . levothyroxine (SYNTHROID, LEVOTHROID) 137 MCG tablet Take 1 tablet (137 mcg total) by mouth daily.  90 tablet  3  . Misc Natural Products (GINSENG COMPLEX PO) Take by mouth.      . mometasone (ELOCON) 0.1 % lotion       . OMEGA-3 1000 MG CAPS Take 1 capsule by mouth daily.        Marland Kitchen omeprazole (PRILOSEC) 20 MG capsule Take 1 capsule (20 mg total) by mouth daily.  90 capsule  3  . oxyCODONE-acetaminophen (ROXICET) 5-325 MG per tablet Take 1 tablet by mouth every 8 (eight)  hours as needed for pain.  20 tablet  0  . simvastatin (ZOCOR) 20 MG tablet Take 1 tablet (20 mg total) by mouth daily.  90 tablet  3  . traZODone (DESYREL) 50 MG tablet Take 0.5-1 tablets (25-50 mg total) by mouth at bedtime as needed for sleep.  30 tablet  3  . amLODipine (NORVASC) 2.5 MG tablet Take 1 tablet (2.5 mg total) by mouth daily.  30 tablet  3   No facility-administered encounter medications on file as of 04/30/2012.   BP 160/100  Pulse 92  Temp(Src) 98.5 F (36.9 C) (Oral)  Wt 188 lb (85.276 kg)  BMI 31.28 kg/m2  SpO2 98%  Review of Systems  Constitutional: Positive for fever. Negative for chills, activity change, appetite change, fatigue and  unexpected weight change.  Eyes: Negative for visual disturbance.  Respiratory: Negative for cough and shortness of breath.   Cardiovascular: Negative for chest pain, palpitations and leg swelling.  Gastrointestinal: Negative for abdominal pain and abdominal distention.  Genitourinary: Negative for dysuria, urgency and difficulty urinating.  Musculoskeletal: Positive for back pain and arthralgias. Negative for gait problem.  Skin: Negative for color change and rash.  Hematological: Negative for adenopathy.  Psychiatric/Behavioral: Positive for decreased concentration. Negative for sleep disturbance and dysphoric mood. The patient is not nervous/anxious.        Objective:   Physical Exam  Constitutional: He is oriented to person, place, and time. He appears well-developed and well-nourished. No distress.  HENT:  Head: Normocephalic and atraumatic.  Right Ear: External ear normal.  Left Ear: External ear normal.  Nose: Nose normal.  Mouth/Throat: Oropharynx is clear and moist. No oropharyngeal exudate.  Eyes: Conjunctivae and EOM are normal. Pupils are equal, round, and reactive to light. Right eye exhibits no discharge. Left eye exhibits no discharge. No scleral icterus.  Neck: Normal range of motion. Neck supple. No tracheal deviation present. No thyromegaly present.  Cardiovascular: Normal rate, regular rhythm and normal heart sounds.  Exam reveals no gallop and no friction rub.   No murmur heard. Pulmonary/Chest: Effort normal and breath sounds normal. No accessory muscle usage. Not tachypneic. No respiratory distress. He has no decreased breath sounds. He has no wheezes. He has no rhonchi. He has no rales. He exhibits no tenderness.  Musculoskeletal: Normal range of motion. He exhibits no edema.  Lymphadenopathy:    He has no cervical adenopathy.  Neurological: He is alert and oriented to person, place, and time. No cranial nerve deficit. Coordination normal.  Skin: Skin is warm and  dry. No rash noted. He is not diaphoretic. No erythema. No pallor.  Psychiatric: He has a normal mood and affect. His behavior is normal. Judgment and thought content normal.          Assessment & Plan:

## 2012-04-30 NOTE — Assessment & Plan Note (Signed)
Persistent hyponatremia, likely secondary to adrenal insufficiency. Will continue Florinef. Will increase salt intake. Will repeat BMP Monday.

## 2012-04-30 NOTE — Assessment & Plan Note (Signed)
Reviewed recent results of cognitive testing with pt which showed mild cognitive impairment. Discussed use of medications including Aricept and Nameda, but recommended waiting until acute issues with blood pressure are resolved.

## 2012-05-03 ENCOUNTER — Ambulatory Visit (INDEPENDENT_AMBULATORY_CARE_PROVIDER_SITE_OTHER): Payer: Medicare Other | Admitting: Cardiovascular Disease

## 2012-05-03 ENCOUNTER — Other Ambulatory Visit (INDEPENDENT_AMBULATORY_CARE_PROVIDER_SITE_OTHER): Payer: Medicare Other

## 2012-05-03 ENCOUNTER — Encounter: Payer: Self-pay | Admitting: Internal Medicine

## 2012-05-03 ENCOUNTER — Encounter: Payer: Self-pay | Admitting: Cardiovascular Disease

## 2012-05-03 ENCOUNTER — Telehealth: Payer: Self-pay | Admitting: *Deleted

## 2012-05-03 VITALS — BP 144/86 | HR 80 | Ht 65.0 in | Wt 191.0 lb

## 2012-05-03 DIAGNOSIS — E785 Hyperlipidemia, unspecified: Secondary | ICD-10-CM

## 2012-05-03 DIAGNOSIS — E871 Hypo-osmolality and hyponatremia: Secondary | ICD-10-CM

## 2012-05-03 DIAGNOSIS — I10 Essential (primary) hypertension: Secondary | ICD-10-CM

## 2012-05-03 DIAGNOSIS — I6529 Occlusion and stenosis of unspecified carotid artery: Secondary | ICD-10-CM

## 2012-05-03 DIAGNOSIS — R0602 Shortness of breath: Secondary | ICD-10-CM

## 2012-05-03 MED ORDER — NITROGLYCERIN 0.4 MG SL SUBL: 0.4000 mg | SUBLINGUAL_TABLET | SUBLINGUAL | Status: DC | PRN

## 2012-05-03 NOTE — Assessment & Plan Note (Signed)
We have encouraged him to increase his salt intake. Uncertain if one of his medications is contributing to his hyponatremia. Agrylin is a new medication. They would like to avoid salt tabs but they do have them.

## 2012-05-03 NOTE — Assessment & Plan Note (Signed)
Labile blood pressures. He does take hydralazine for hypertension, systolic pressure 170 on an as-needed basis. Blood pressure does drop sometimes into the 90s systolic. We have suggested he could try one half dose nitroglycerin when necessary and this may help improve his blood pressure without the long-lasting effect of hydralazine. He may need to increase his salt intake for hypotension. We'll hold off on starting midodrine as most of his problems seem to be from high blood pressure and not hypotension. No recent dizziness.

## 2012-05-03 NOTE — Assessment & Plan Note (Signed)
Cholesterol is at goal on the current lipid regimen. No changes to the medications were made.  

## 2012-05-03 NOTE — Telephone Encounter (Signed)
What labs and dx would you like for this pt?  

## 2012-05-03 NOTE — Progress Notes (Signed)
Patient ID: Johnny Sanford, male    DOB: [birth year], 72 y.o.   MRN: [id number]  HPI Comments: 72 year old gentleman, patient of Dr. Ronna Polio, with history of GERD, CVA, carotid arterial disease with a carotid endarterectomy on the left in 2003 with 60% disease on the right, hypothyroidism, very labile blood pressures, Started on Florinef by a [facility] physician, history of throat cancer with radiation , who presents for routine followup   He reports that he has been doing well recently. His blood pressure is still labile though. Sodium has been running low, sometimes down to 125. He does report recent high blood pressures this past week with systolic up to 160 and 170. He has been taking occasional hydralazine which seems to improve his blood pressure is sometimes caused a drop to systolic of 90. He and his wife thinks that anxiety is playing a role in his high blood pressures. Denies any near syncope or syncope.still taking Florinef. pass out. He is relatively active. Otherwise has no new complaints.  He was admitted to the hospital 01/12/2012 with confusion, slurred speech, rule out for CVA, sodium was 126   Carotid ultrasound showed no significant disease on the left, 60% blockage on the right Interestingly carotid ultrasound December 2013 at [facility] showed atherosclerotic disease with no significant stenosis He was told that he has spinal stenosis  Total cholesterol 137, LDL 62, HDL 60 on December 2013  EKG shows normal sinus rhythm with rate 80 beats per minute with no significant ST or T wave changes     Outpatient Encounter Prescriptions as of 05/03/2012  Medication Sig Dispense Refill  . ALPRAZolam (XANAX) 0.5 MG tablet Take 1 tablet (0.5 mg total) by mouth 3 (three) times daily as needed for anxiety.  90 tablet  3  . amLODipine (NORVASC) 2.5 MG tablet Take 1 tablet (2.5 mg total) by mouth daily.  30 tablet  3  . anagrelide (AGRYLIN) 0.5 MG capsule Take 1 mg by mouth 2 (two) times daily.        Marland Kitchen aspirin 81 MG EC tablet Take 162 mg by mouth daily.       . budesonide-formoterol (SYMBICORT) 160-4.5 MCG/ACT inhaler Inhale 2 puffs into the lungs 2 (two) times daily.  1 Inhaler  3  . Calcium Carb-Cholecalciferol (CALCIUM 1000 + D PO) Take 1 tablet by mouth daily.        . Cyanocobalamin (VITAMIN B-12 PO) Take by mouth daily.      . fludrocortisone (FLORINEF) 0.1 MG tablet Take 0.5 tablets (0.05 mg total) by mouth daily.  90 tablet  3  . fluorouracil (EFUDEX) 5 % cream Apply topically as needed.      . fluticasone (FLONASE) 50 MCG/ACT nasal spray Place 1 spray into the nose daily as needed for rhinitis.  16 g  3  . hydrALAZINE (APRESOLINE) 25 MG tablet Take 1 tablet (25 mg total) by mouth 3 (three) times daily. Use as needed for BP >160/100  90 tablet  3  . levothyroxine (SYNTHROID, LEVOTHROID) 137 MCG tablet Take 1 tablet (137 mcg total) by mouth daily.  90 tablet  3  . Misc Natural Products (GINSENG COMPLEX PO) Take by mouth.      . mometasone (ELOCON) 0.1 % lotion       . OMEGA-3 1000 MG CAPS Take 1 capsule by mouth daily.        Marland Kitchen omeprazole (PRILOSEC) 20 MG capsule Take 1 capsule (20 mg total) by mouth daily.  90 capsule  3  . simvastatin (ZOCOR) 20 MG tablet Take 1 tablet (20 mg total) by mouth daily.  90 tablet  3  . traZODone (DESYREL) 50 MG tablet Take 0.5-1 tablets (25-50 mg total) by mouth at bedtime as needed for sleep.  30 tablet  3  . nitroGLYCERIN (NITROSTAT) 0.4 MG SL tablet Place 1 tablet (0.4 mg total) under the tongue every 5 (five) minutes as needed for chest pain.  25 tablet  3  . [DISCONTINUED] gabapentin (NEURONTIN) 100 MG capsule Take 1 capsule (100 mg total) by mouth at bedtime.  30 capsule  3  . [DISCONTINUED] oxyCODONE-acetaminophen (ROXICET) 5-325 MG per tablet Take 1 tablet by mouth every 8 (eight) hours as needed for pain.  20 tablet  0   No facility-administered encounter medications on file as of 05/03/2012.     Review of Systems  Constitutional:  Negative.   HENT: Negative.   Eyes: Negative.   Respiratory: Negative.   Cardiovascular: Negative.   Gastrointestinal: Negative.   Musculoskeletal: Negative.   Skin: Negative.   Neurological: Positive for dizziness.  Psychiatric/Behavioral: Negative.   All other systems reviewed and are negative.    BP 144/86  Pulse 80  Ht 5\' 5"  (1.651 m)  Wt 191 lb (86.637 kg)  BMI 31.78 kg/m2  SpO2 91%  Physical Exam  Nursing note and vitals reviewed. Constitutional: He is oriented to person, place, and time. He appears well-developed and well-nourished.  HENT:  Head: Normocephalic.  Nose: Nose normal.  Mouth/Throat: Oropharynx is clear and moist.  Eyes: Conjunctivae are normal. Pupils are equal, round, and reactive to light.  Neck: Normal range of motion. Neck supple. No JVD present.  Cardiovascular: Normal rate, regular rhythm, S1 normal, S2 normal and intact distal pulses.  Exam reveals no gallop and no friction rub.   Murmur heard.  Crescendo systolic murmur is present with a grade of 1/6  Pulmonary/Chest: Effort normal and breath sounds normal. No respiratory distress. He has no wheezes. He has no rales. He exhibits no tenderness.  Abdominal: Soft. Bowel sounds are normal. He exhibits no distension. There is no tenderness.  Musculoskeletal: Normal range of motion. He exhibits no edema and no tenderness.  Lymphadenopathy:    He has no cervical adenopathy.  Neurological: He is alert and oriented to person, place, and time. Coordination normal.  Skin: Skin is warm and dry. No rash noted. No erythema.  Psychiatric: He has a normal mood and affect. His behavior is normal. Judgment and thought content normal.      Assessment and Plan

## 2012-05-03 NOTE — Patient Instructions (Addendum)
You are doing well. No medication changes were made.  Please use nitroglycerin 1/2 dose for emergency high blood pressure  Please call us if you have new issues that need to be addressed before your next appt.  Your physician wants you to follow-up in: 6 months.  You will receive a reminder letter in the mail two months in advance. If you don't receive a letter, please call our office to schedule the follow-up appointment.

## 2012-05-03 NOTE — Telephone Encounter (Signed)
BMP hyponatremia

## 2012-05-03 NOTE — Assessment & Plan Note (Signed)
Previously noted to have at least moderate disease on the right. Carotid ultrasound in the hospital showing no significant stenosis. Would Recommend recheck in 6 months time

## 2012-05-04 LAB — BASIC METABOLIC PANEL
CO2: 28 mEq/L (ref 19–32)
Chloride: 91 mEq/L — ABNORMAL LOW (ref 96–112)
Potassium: 4.8 mEq/L (ref 3.5–5.1)

## 2012-05-05 LAB — CBC CANCER CENTER
Basophil %: 1.1 %
Eosinophil %: 1.1 %
HCT: 35.8 % — ABNORMAL LOW (ref 40.0–52.0)
HGB: 12 g/dL — ABNORMAL LOW (ref 13.0–18.0)
Lymphocyte #: 0.8 x10 3/mm — ABNORMAL LOW (ref 1.0–3.6)
Lymphocyte %: 13.3 %
MCH: 37.3 pg — ABNORMAL HIGH (ref 26.0–34.0)
MCHC: 33.6 g/dL (ref 32.0–36.0)
MCV: 111 fL — ABNORMAL HIGH (ref 80–100)
Monocyte #: 0.9 x10 3/mm (ref 0.2–1.0)
Monocyte %: 13.5 %
Neutrophil #: 4.5 x10 3/mm (ref 1.4–6.5)
Neutrophil %: 71 %
RDW: 13.7 % (ref 11.5–14.5)

## 2012-05-12 ENCOUNTER — Encounter: Payer: Self-pay | Admitting: Internal Medicine

## 2012-05-16 ENCOUNTER — Encounter: Payer: Self-pay | Admitting: Internal Medicine

## 2012-05-18 ENCOUNTER — Telehealth: Payer: Self-pay | Admitting: *Deleted

## 2012-05-18 ENCOUNTER — Other Ambulatory Visit (INDEPENDENT_AMBULATORY_CARE_PROVIDER_SITE_OTHER): Payer: Medicare Other

## 2012-05-18 DIAGNOSIS — E876 Hypokalemia: Secondary | ICD-10-CM

## 2012-05-18 DIAGNOSIS — E039 Hypothyroidism, unspecified: Secondary | ICD-10-CM

## 2012-05-18 LAB — COMPREHENSIVE METABOLIC PANEL
Albumin: 3.9 g/dL (ref 3.5–5.2)
BUN: 9 mg/dL (ref 6–23)
CO2: 30 mEq/L (ref 19–32)
Calcium: 9 mg/dL (ref 8.4–10.5)
Chloride: 87 mEq/L — ABNORMAL LOW (ref 96–112)
Creatinine, Ser: 0.6 mg/dL (ref 0.4–1.5)
GFR: 138.56 mL/min (ref >60.00)

## 2012-05-18 NOTE — Telephone Encounter (Signed)
What labs and dx code would you like done?

## 2012-05-18 NOTE — Telephone Encounter (Signed)
Please send CMP and TSH - hypokalemia and hypothyroidism

## 2012-05-19 LAB — CBC CANCER CENTER
Basophil %: 1.3 %
Eosinophil #: 0.1 x10 3/mm (ref 0.0–0.7)
Eosinophil %: 1.7 %
HCT: 35.8 % — ABNORMAL LOW (ref 40.0–52.0)
HGB: 11.9 g/dL — ABNORMAL LOW (ref 13.0–18.0)
Lymphocyte %: 13.6 %
MCH: 35.7 pg — ABNORMAL HIGH (ref 26.0–34.0)
MCHC: 33.2 g/dL (ref 32.0–36.0)
MCV: 108 fL — ABNORMAL HIGH (ref 80–100)
Monocyte #: 0.9 x10 3/mm (ref 0.2–1.0)
Neutrophil #: 5.3 x10 3/mm (ref 1.4–6.5)
Platelet: 669 x10 3/mm — ABNORMAL HIGH (ref 150–440)
RDW: 14.8 % — ABNORMAL HIGH (ref 11.5–14.5)

## 2012-05-20 ENCOUNTER — Telehealth: Payer: Self-pay | Admitting: *Deleted

## 2012-05-20 ENCOUNTER — Ambulatory Visit (INDEPENDENT_AMBULATORY_CARE_PROVIDER_SITE_OTHER): Payer: Medicare Other | Admitting: Internal Medicine

## 2012-05-20 ENCOUNTER — Encounter: Payer: Self-pay | Admitting: Internal Medicine

## 2012-05-20 VITALS — BP 174/110 | HR 80 | Temp 98.2°F | Wt 193.0 lb

## 2012-05-20 DIAGNOSIS — E2740 Unspecified adrenocortical insufficiency: Secondary | ICD-10-CM

## 2012-05-20 DIAGNOSIS — R0989 Other specified symptoms and signs involving the circulatory and respiratory systems: Secondary | ICD-10-CM | POA: Insufficient documentation

## 2012-05-20 DIAGNOSIS — E2749 Other adrenocortical insufficiency: Secondary | ICD-10-CM

## 2012-05-20 DIAGNOSIS — I10 Essential (primary) hypertension: Secondary | ICD-10-CM

## 2012-05-20 DIAGNOSIS — E871 Hypo-osmolality and hyponatremia: Secondary | ICD-10-CM

## 2012-05-20 DIAGNOSIS — D473 Essential (hemorrhagic) thrombocythemia: Secondary | ICD-10-CM

## 2012-05-20 MED ORDER — ATENOLOL 12.5 MG HALF TABLET
12.5000 mg | ORAL_TABLET | Freq: Every day | ORAL | Status: DC
Start: 2012-05-20 — End: 2012-06-03

## 2012-05-20 MED ORDER — CITALOPRAM HYDROBROMIDE 10 MG PO TABS: 10.0000 mg | ORAL_TABLET | Freq: Every day | ORAL | Status: DC

## 2012-05-20 NOTE — Progress Notes (Signed)
Subjective:    Patient ID: Johnny Sanford, male    DOB: 04-Jun-1940, 72 y.o.   MRN: [id number]  HPI 72 year old male with history of essential thrombocythemia, labile hypertension, hypothyroidism, rheumatoid arthritis presents for followup. Over the last several months his blood pressure has been increasingly labile. His endocrinologist stopped his Florinef after testing for tree no insufficiency was negative. After stopping the Florinef, he was noted to have progressive hyponatremia and hypotension with blood pressure 60s over 40s. With these episodes he is symptomatic with confusion and lightheadedness. Patient reports that his endocrinologist asked him to restart his Florinef today. Yesterday and today, his blood pressure has been markedly elevated, greater than 170/100. He denies any symptoms with this. At his last visit, he was noted to be hypertensive and was started on amlodipine. However, he reports that when taking this medication his blood pressure sometimes drops to the 60s over 40s. He has tried to maintain adequate salt intake. He denies any symptoms of chest pain, shortness of breath, headache. He has been working with his hematologist to adjust medication, anagrelide, to help better control his platelet. He reports that recent CBC showed improvement in platelet count. Recent bone marrow biopsy showed essential thrombocythemia with no other abnormalities noted. His wife reports significant anxiety related to ongoing medical issues.  Outpatient Encounter Prescriptions as of 05/20/2012  Medication Sig Dispense Refill  . ALPRAZolam (XANAX) 0.5 MG tablet Take 1 tablet (0.5 mg total) by mouth 3 (three) times daily as needed for anxiety.  90 tablet  3  . anagrelide (AGRYLIN) 0.5 MG capsule Take 1 mg by mouth 2 (two) times daily. Take 4 tablets in the morning and 4 tablets at night      . aspirin 81 MG EC tablet Take 162 mg by mouth daily.       . budesonide-formoterol (SYMBICORT) 160-4.5 MCG/ACT  inhaler Inhale 2 puffs into the lungs 2 (two) times daily.  1 Inhaler  3  . Calcium Carb-Cholecalciferol (CALCIUM 1000 + D PO) Take 1 tablet by mouth daily.        . Cyanocobalamin (VITAMIN B-12 PO) Take by mouth daily.      . fludrocortisone (FLORINEF) 0.1 MG tablet Take 0.5 tablets (0.05 mg total) by mouth daily.  90 tablet  3  . fluorouracil (EFUDEX) 5 % cream Apply topically as needed.      . fluticasone (FLONASE) 50 MCG/ACT nasal spray Place 1 spray into the nose daily as needed for rhinitis.  16 g  3  . gabapentin (NEURONTIN) 100 MG capsule       . hydrALAZINE (APRESOLINE) 25 MG tablet Take 1 tablet (25 mg total) by mouth 3 (three) times daily. Use as needed for BP >160/100  90 tablet  3  . levothyroxine (SYNTHROID, LEVOTHROID) 137 MCG tablet Take 1 tablet (137 mcg total) by mouth daily.  90 tablet  3  . mometasone (ELOCON) 0.1 % lotion       . OMEGA-3 1000 MG CAPS Take 1 capsule by mouth daily.        Marland Kitchen omeprazole (PRILOSEC) 20 MG capsule Take 1 capsule (20 mg total) by mouth daily.  90 capsule  3  . oxyCODONE-acetaminophen (PERCOCET/ROXICET) 5-325 MG per tablet       . simvastatin (ZOCOR) 20 MG tablet Take 1 tablet (20 mg total) by mouth daily.  90 tablet  3  . traZODone (DESYREL) 50 MG tablet Take 0.5-1 tablets (25-50 mg total) by mouth at bedtime as needed for sleep.  30 tablet  3  . [DISCONTINUED] amLODipine (NORVASC) 2.5 MG tablet Take 1 tablet (2.5 mg total) by mouth daily.  30 tablet  3  . atenolol (TENORMIN) 12.5 mg TABS Take 0.5 tablets (12.5 mg total) by mouth daily.  30 tablet  3  . citalopram (CELEXA) 10 MG tablet Take 1 tablet (10 mg total) by mouth daily.  30 tablet  3  . Misc Natural Products (GINSENG COMPLEX PO) Take by mouth.      . nitroGLYCERIN (NITROSTAT) 0.4 MG SL tablet Place 1 tablet (0.4 mg total) under the tongue every 5 (five) minutes as needed for chest pain.  25 tablet  3   No facility-administered encounter medications on file as of 05/20/2012.   BP 174/110   Pulse 80  Temp(Src) 98.2 F (36.8 C) (Oral)  Wt 193 lb (87.544 kg)  BMI 32.12 kg/m2  SpO2 96%  Review of Systems  Constitutional: Negative for fever, chills, activity change, appetite change, fatigue and unexpected weight change.  Eyes: Negative for visual disturbance.  Respiratory: Negative for cough and shortness of breath.   Cardiovascular: Negative for chest pain, palpitations and leg swelling.  Gastrointestinal: Negative for abdominal pain and abdominal distention.  Genitourinary: Negative for dysuria, urgency and difficulty urinating.  Musculoskeletal: Negative for arthralgias and gait problem.  Skin: Negative for color change and rash.  Hematological: Negative for adenopathy.  Psychiatric/Behavioral: Negative for sleep disturbance and dysphoric mood. The patient is not nervous/anxious.        Objective:   Physical Exam  Constitutional: He is oriented to person, place, and time. He appears well-developed and well-nourished. No distress.  HENT:  Head: Normocephalic and atraumatic.  Right Ear: External ear normal.  Left Ear: External ear normal.  Nose: Nose normal.  Mouth/Throat: Oropharynx is clear and moist. No oropharyngeal exudate.  Eyes: Conjunctivae and EOM are normal. Pupils are equal, round, and reactive to light. Right eye exhibits no discharge. Left eye exhibits no discharge. No scleral icterus.  Neck: Normal range of motion. Neck supple. No tracheal deviation present. No thyromegaly present.  Cardiovascular: Normal rate, regular rhythm and normal heart sounds.  Exam reveals no gallop and no friction rub.   No murmur heard. Pulmonary/Chest: Effort normal and breath sounds normal. No respiratory distress. He has no wheezes. He has no rales. He exhibits no tenderness.  Musculoskeletal: Normal range of motion. He exhibits no edema.  Lymphadenopathy:    He has no cervical adenopathy.  Neurological: He is alert and oriented to person, place, and time. No cranial nerve  deficit. Coordination normal.  Skin: Skin is warm and dry. No rash noted. He is not diaphoretic. No erythema. No pallor.  Psychiatric: He has a normal mood and affect. His behavior is normal. Judgment and thought content normal.          Assessment & Plan:

## 2012-05-20 NOTE — Assessment & Plan Note (Signed)
Patient recently restarted on Florinef after being found to have hyponatremia when off medication. Will plan to continue.

## 2012-05-20 NOTE — Assessment & Plan Note (Signed)
Secondary to adrenal insufficiency. Recheck BMP today.

## 2012-05-20 NOTE — Telephone Encounter (Signed)
Informed patient wife of instructions and she agreed, verbalized understanding of instructions of not starting medication and stopping the amlodipine as well.

## 2012-05-20 NOTE — Telephone Encounter (Signed)
Message copied by Theola Sequin on Thu May 20, 2012  4:47 PM ------      Message from: Ronna Polio A      Created: Thu May 20, 2012  4:25 PM      Regarding: FW: Labile BP       Okey Regal, Can you call Mr. Lage and let him know that Dr. Lorenz Coaster recommended holding long acting BP meds. So, lets have him STOP the amlodipine as we discussed and also hold on starting the Atenolol. I do want him to start the Citalopram.      ----- Message -----         From: Antonieta Iba, MD         Sent: 05/20/2012   4:07 PM           To: Wynona Dove, MD      Subject: RE: Labile BP                                            Very challenging Labile blood pressures.       I suspect he will do more harm when he drops low and he would running high. Wife was concerned about anxiety causing higher blood pressures .             He may do better taking hydralazine periodically during the daytime for high blood pressures , stay on low-dose Florinef . Nitroglycerin either half dose or full dose for hypertension . Try to avoid long-acting medications/antihypertensives.             If low continues to be an issue, potentially we could add low-dose midodrine when necessary.             The want [place] to call him to discuss things?      thx      Tim                  ----- Message -----         From: Wynona Dove, MD         Sent: 05/20/2012   3:09 PM           To: Antonieta Iba, MD      Subject: Labile BP                                                Tim,       We are having a difficult time managing Mr. Utz's labile BP. BP varying from 60/40s to 170s/100s. I tried adding Amlodipine 2.5mg  but this bottomed him out. I read some literature about betablockers in pt with labile BP, so trying to add Atenolol and also Citalopram to help with anxiety. I would greatly appreciate any advice you might have.      Thanks,      Candise Bowens             ------

## 2012-05-20 NOTE — Assessment & Plan Note (Signed)
Blood pressure highly labile ranging from 60/40-170/100. Likely related to adrenal insufficiency. Extensive evaluation including evaluation in the past for pheochromocytoma has been negative. Reviewed recent literature related to labile hypertension. Discussed with patient's cardiologist. Will plan to start citalopram to help with symptoms of anxiety and hopefully control peaks in blood pressure. We'll continue to use hydralazine for elevated blood pressure greater than 160/100. If periods of elevated blood pressure are persisting, would favor adding atenolol 12.5 mg daily.  Over of which >50% spent in face-to-face contact with patient discussing plan of care

## 2012-05-20 NOTE — Patient Instructions (Signed)
Stop Amlodipine.  Tomorrow, start Atenolol 12.5mg  daily and Citalopram 10mg  daily.  Follow up 2 weeks.

## 2012-05-20 NOTE — Assessment & Plan Note (Signed)
Will request recent lab work including platelet count. Reviewed recent bone marrow biopsy results which showed essential thrombocythemia but no new findings. Continue anagrelide.

## 2012-05-21 ENCOUNTER — Telehealth: Payer: Self-pay | Admitting: *Deleted

## 2012-05-21 LAB — BASIC METABOLIC PANEL
Anion Gap: 5 — ABNORMAL LOW (ref 7–16)
BUN: 14 mg/dL (ref 6–23)
Calcium, Total: 8.4 mg/dL — ABNORMAL LOW (ref 8.5–10.1)
Calcium: 8.7 mg/dL (ref 8.4–10.5)
Co2: 28 mmol/L (ref 21–32)
EGFR (African American): >60
EGFR (Non-African Amer.): >60
GFR: 114.43 mL/min (ref >60.00)
Glucose: 101 mg/dL — ABNORMAL HIGH (ref 65–99)
Osmolality: 243 (ref 275–301)
Potassium: 4.9 mEq/L (ref 3.5–5.1)
Potassium: 3.9 mmol/L (ref 3.5–5.1)
Sodium: 124 mEq/L — ABNORMAL LOW (ref 135–145)

## 2012-05-21 LAB — CBC
HCT: 35.9 % — ABNORMAL LOW (ref 40.0–52.0)
HGB: 12.3 g/dL — ABNORMAL LOW (ref 13.0–18.0)
MCH: 36.7 pg — ABNORMAL HIGH (ref 26.0–34.0)
MCV: 107 fL — ABNORMAL HIGH (ref 80–100)
Platelet: 695 10*3/uL — ABNORMAL HIGH (ref 150–440)
RBC: 3.36 10*6/uL — ABNORMAL LOW (ref 4.40–5.90)
WBC: 8.3 10*3/uL (ref 3.8–10.6)

## 2012-05-21 LAB — URINALYSIS, COMPLETE
Bilirubin,UR: NEGATIVE
Blood: NEGATIVE
Leukocyte Esterase: NEGATIVE
Ph: 8 (ref 4.5–8.0)
Protein: 30
RBC,UR: 1 /HPF (ref 0–5)
Specific Gravity: 1.012 (ref 1.003–1.030)
WBC UR: 1 /HPF (ref 0–5)

## 2012-05-21 LAB — TROPONIN I: Troponin-I: <0.02 ng/mL

## 2012-05-21 NOTE — Telephone Encounter (Signed)
He can use hydralazine for elevated BP >160/100. If not feeling well, headache persistent or severe, or if he has confusion, then he will need to go to the ED for evaluation. In the hospital, they would be able to give him IVF to help correct sodium if necessary. I think that the sodium will improve as he is back on Florinef. He will need to have repeat BMP on Monday.

## 2012-05-21 NOTE — Telephone Encounter (Signed)
Message copied by Theola Sequin on Fri May 21, 2012  1:14 PM ------      Message from: Ronna Polio A      Created: Fri May 21, 2012 11:13 AM       Labs show that sodium is slightly lower. Did pt restart Florinef yesterday? Please confirm which dose he started at? Please ask his wife how he has been over last 24 hr. Any confusion? Weakness? ------

## 2012-05-21 NOTE — Telephone Encounter (Signed)
Pt is coming in for labs Monday 04.28.2014, is it just for a BMP? If so what dx code?  Thank you

## 2012-05-21 NOTE — Telephone Encounter (Signed)
Informed patient wife of the plan and she verbally agreed understanding. Stated that his headache is not that bad just a nagging headache but she will take him to the ED if things change. Appt scheduled for Monday

## 2012-05-21 NOTE — Telephone Encounter (Signed)
Spoke with patient about his medication, he is taking .05 Florinef once a day and he began taking it yesterday. His BP has been going up and down all day and now he is c/o headache. This morning it was 176/112 then after he took his medication for his platelets it went down to 140/64, 107/66 and now it has jumped back up to 174/93. He is feeling really nervous and shaky about his BP

## 2012-05-23 ENCOUNTER — Encounter: Payer: Self-pay | Admitting: Internal Medicine

## 2012-05-23 LAB — BASIC METABOLIC PANEL
Anion Gap: 9 (ref 7–16)
BUN: 8 mg/dL (ref 7–18)
Calcium, Total: 8.1 mg/dL — ABNORMAL LOW (ref 8.5–10.1)
EGFR (Non-African Amer.): >60
Glucose: 101 mg/dL — ABNORMAL HIGH (ref 65–99)
Osmolality: 244 (ref 275–301)
Potassium: 3.4 mmol/L — ABNORMAL LOW (ref 3.5–5.1)
Sodium: 122 mmol/L — ABNORMAL LOW (ref 136–145)

## 2012-05-23 LAB — OSMOLALITY: Osmolality: 242 mOsm/kg — CL (ref 280–301)

## 2012-05-23 NOTE — Telephone Encounter (Signed)
hyponatremia

## 2012-05-24 ENCOUNTER — Telehealth: Payer: Self-pay

## 2012-05-24 ENCOUNTER — Other Ambulatory Visit: Payer: Self-pay | Admitting: *Deleted

## 2012-05-24 ENCOUNTER — Other Ambulatory Visit: Payer: Medicare Other

## 2012-05-24 ENCOUNTER — Inpatient Hospital Stay: Payer: Self-pay | Admitting: Specialist

## 2012-05-24 DIAGNOSIS — E871 Hypo-osmolality and hyponatremia: Secondary | ICD-10-CM

## 2012-05-24 LAB — BASIC METABOLIC PANEL
Anion Gap: 9 (ref 7–16)
BUN: 8 mg/dL (ref 7–18)
Chloride: 86 mmol/L — ABNORMAL LOW (ref 98–107)
Glucose: 102 mg/dL — ABNORMAL HIGH (ref 65–99)
Osmolality: 244 (ref 275–301)
Potassium: 3.4 mmol/L — ABNORMAL LOW (ref 3.5–5.1)
Sodium: 122 mmol/L — ABNORMAL LOW (ref 136–145)

## 2012-05-24 LAB — CHLORIDE, URINE, RANDOM: Chloride, Urine Random: 146 mmol/L — ABNORMAL HIGH (ref 55–125)

## 2012-05-24 LAB — URIC ACID: Uric Acid: 1.8 mg/dL — ABNORMAL LOW (ref 3.5–7.2)

## 2012-05-24 LAB — TSH: Thyroid Stimulating Horm: 1.33 u[IU]/mL

## 2012-05-24 LAB — OSMOLALITY, URINE: Osmolality: 552 mOsm/kg

## 2012-05-24 LAB — SODIUM
Sodium: 117 mmol/L — CL (ref 136–145)
Sodium: 122 mmol/L — ABNORMAL LOW (ref 136–145)

## 2012-05-24 NOTE — Telephone Encounter (Signed)
Message copied by [facility], Suprina Mandeville E on Mon May 24, 2012  2:47 PM ------      Message from: Thersa Salt      Created: Mon May 24, 2012  1:17 PM      Regarding: RE: Labile BP       Pt wife states that pt in hospital been there since friday      ----- Message -----         From: Marcelle Overlie, RN         Sent: 05/24/2012   8:42 AM           To: Edman Circle      Subject: FW: Labile BP                                                        ----- Message -----         From: Antonieta Iba, MD         Sent: 05/24/2012   7:52 AM           To: Marcelle Overlie, RN      Subject: FW: Labile BP                                            His Florinef was stopped by endocrine      Recently restarted      Blood pressures have been up and down      Can we call him to see if he wants to come in for a visit to discuss his blood pressures?            Tim                  ----- Message -----         From: Wynona Dove, MD         Sent: 05/20/2012   4:24 PM           To: Antonieta Iba, MD      Subject: RE: Labile BP                                            The florinef had recently been stopped by endocrinology, and I think this made things worse. It is now restarted.      Would you mind seeing him back a little sooner for a visit? I think this might be helpful for him and his wife.      ----- Message -----         From: Antonieta Iba, MD         Sent: 05/20/2012   4:07 PM           To: Wynona Dove, MD      Subject: RE: Labile BP                                            Very challenging  Labile blood pressures.       I suspect he will do more harm when he drops low and he would running high. Wife was concerned about anxiety causing higher blood pressures .             He may do better taking hydralazine periodically during the daytime for high blood pressures , stay on low-dose Florinef . Nitroglycerin either half dose or full dose for hypertension . Try  to avoid long-acting medications/antihypertensives.             If low continues to be an issue, potentially we could add low-dose midodrine when necessary.             The want [place] to call him to discuss things?      thx      Tim                  ----- Message -----         From: Wynona Dove, MD         Sent: 05/20/2012   3:09 PM           To: Antonieta Iba, MD      Subject: Labile BP                                                Tim,       We are having a difficult time managing [name]'s labile BP. BP varying from 60/40s to 170s/100s. I tried adding Amlodipine 2.5mg  but this bottomed him out. I read some literature about betablockers in pt with labile BP, so trying to add Atenolol and also Citalopram to help with anxiety. I would greatly appreciate any advice you might have.      Thanks,      Candise Bowens                         ------

## 2012-05-24 NOTE — Telephone Encounter (Signed)
fyi

## 2012-05-25 ENCOUNTER — Ambulatory Visit: Payer: Medicare Other | Admitting: Cardiovascular Disease

## 2012-05-25 LAB — BASIC METABOLIC PANEL
Anion Gap: 4 — ABNORMAL LOW (ref 7–16)
Calcium, Total: 8.3 mg/dL — ABNORMAL LOW (ref 8.5–10.1)
Chloride: 90 mmol/L — ABNORMAL LOW (ref 98–107)
Creatinine: 0.6 mg/dL (ref 0.60–1.30)
EGFR (Non-African Amer.): >60
Sodium: 126 mmol/L — ABNORMAL LOW (ref 136–145)

## 2012-05-25 LAB — SODIUM
Sodium: 126 mmol/L — ABNORMAL LOW (ref 136–145)
Sodium: 125 mmol/L — ABNORMAL LOW (ref 136–145)
Sodium: 126 mmol/L — ABNORMAL LOW (ref 136–145)

## 2012-05-26 LAB — SODIUM: Sodium: 130 mmol/L — ABNORMAL LOW (ref 136–145)

## 2012-05-27 ENCOUNTER — Ambulatory Visit: Payer: Self-pay | Admitting: Oncology

## 2012-06-03 ENCOUNTER — Ambulatory Visit (INDEPENDENT_AMBULATORY_CARE_PROVIDER_SITE_OTHER): Payer: Medicare Other | Admitting: Internal Medicine

## 2012-06-03 ENCOUNTER — Encounter: Payer: Self-pay | Admitting: Internal Medicine

## 2012-06-03 VITALS — BP 108/68 | HR 86 | Temp 98.6°F | Resp 16 | Wt 185.0 lb

## 2012-06-03 DIAGNOSIS — E871 Hypo-osmolality and hyponatremia: Secondary | ICD-10-CM

## 2012-06-03 DIAGNOSIS — I10 Essential (primary) hypertension: Secondary | ICD-10-CM

## 2012-06-03 DIAGNOSIS — R0989 Other specified symptoms and signs involving the circulatory and respiratory systems: Secondary | ICD-10-CM

## 2012-06-03 DIAGNOSIS — R9389 Abnormal findings on diagnostic imaging of other specified body structures: Secondary | ICD-10-CM

## 2012-06-03 NOTE — Assessment & Plan Note (Signed)
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 Patient has been placed on fluid restriction. Patient has followup with nephrology tomorrow. Will forward records on previous evaluation performed here which was more suggestive of dehydration.

## 2012-06-03 NOTE — Assessment & Plan Note (Signed)
Status post recent hospitalization for labile hypertension and hyponatremia. Blood pressures were recently improved with only one instance of patient requiring additional hydralazine. 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 Plan to repeat electrolytes and renal function with nephrologist tomorrow. Followup here in 3 weeks.

## 2012-06-03 NOTE — Assessment & Plan Note (Signed)
Patient had CT of the chest during recent admission which showed multiple nodules in the left lung. Plan for repeat CT of the chest in August 2014.

## 2012-06-03 NOTE — Progress Notes (Signed)
Subjective:    Patient ID: Johnny Sanford, male    DOB: October 31, 1940, 72 y.o.   MRN: [id number]  HPI 72 year old male with history of essential thrombocythemia, adrenal insufficiency, labile hypertension, and hyponatremia presents for followup after recent hospitalization. He was admitted to the hospital on April 27 for elevated blood pressure, greater than 200/100. He was found to be hyponatremic with sodium of 125. Blood pressure was ultimately controlled with use of hydralazine. 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 Sodium gradually improved with fluid restriction. He has been placed on 33 ounce fluid restriction per day on discharge. He reports he is generally been feeling well, except for fatigue. His wife reports that his blood pressures have generally been well-controlled except on one occasion when the blood pressure was greater than 170/100 which she treated with hydralazine with improvement. During his recent admission he was also noted to have pulmonary nodules on CT of the chest. He reports he is scheduled for repeat CT in 3 months.  Outpatient Encounter Prescriptions as of 06/03/2012  Medication Sig Dispense Refill  . anagrelide (AGRYLIN) 0.5 MG capsule Take 1 mg by mouth 2 (two) times daily. Take 4 tablets in the morning and 4 tablets at night      . aspirin 81 MG EC tablet Take 162 mg by mouth daily.       . budesonide-formoterol (SYMBICORT) 160-4.5 MCG/ACT inhaler Inhale 2 puffs into the lungs 2 (two) times daily.  1 Inhaler  3  . Calcium Carb-Cholecalciferol (CALCIUM 1000 + D PO) Take 1 tablet by mouth daily.        . citalopram (CELEXA) 10 MG tablet Take 1 tablet (10 mg total) by mouth daily.  30 tablet  3  . Cyanocobalamin (VITAMIN B-12 PO) Take by mouth daily.      . fludrocortisone (FLORINEF) 0.1 MG tablet Take 0.5 tablets (0.05 mg total) by mouth daily.  90 tablet  3  . fluorouracil (EFUDEX) 5 % cream Apply topically as needed.      . fluticasone (FLONASE)  50 MCG/ACT nasal spray Place 1 spray into the nose daily as needed for rhinitis.  16 g  3  . gabapentin (NEURONTIN) 100 MG capsule       . hydrALAZINE (APRESOLINE) 25 MG tablet Take 1 tablet (25 mg total) by mouth 3 (three) times daily. Use as needed for BP >160/100  90 tablet  3  . levothyroxine (SYNTHROID, LEVOTHROID) 137 MCG tablet Take 1 tablet (137 mcg total) by mouth daily.  90 tablet  3  . OMEGA-3 1000 MG CAPS Take 1 capsule by mouth daily.        Marland Kitchen omeprazole (PRILOSEC) 20 MG capsule Take 1 capsule (20 mg total) by mouth daily.  90 capsule  3  . simvastatin (ZOCOR) 20 MG tablet Take 1 tablet (20 mg total) by mouth daily.  90 tablet  3  . traZODone (DESYREL) 50 MG tablet Take 0.5-1 tablets (25-50 mg total) by mouth at bedtime as needed for sleep.  30 tablet  3  . [DISCONTINUED] nitroGLYCERIN (NITROSTAT) 0.4 MG SL tablet Place 1 tablet (0.4 mg total) under the tongue every 5 (five) minutes as needed for chest pain.  25 tablet  3  . mometasone (ELOCON) 0.1 % lotion        No facility-administered encounter medications on file as of 06/03/2012.   BP 108/68  Pulse 86  Temp(Src) 98.6 F (37 C) (Oral)  Resp 16  Wt 185 lb (83.915  kg)  BMI 30.79 kg/m2  SpO2 92%  Review of Systems  Constitutional: Positive for fatigue. Negative for fever, chills, diaphoresis, activity change, appetite change and unexpected weight change.  Eyes: Negative for visual disturbance.  Respiratory: Negative for cough, shortness of breath, wheezing and stridor.   Cardiovascular: Negative for chest pain, palpitations and leg swelling.  Gastrointestinal: Negative for abdominal pain, diarrhea, constipation and abdominal distention.  Genitourinary: Negative for dysuria, urgency, frequency, hematuria, flank pain, decreased urine volume and difficulty urinating.  Musculoskeletal: Positive for back pain. Negative for myalgias, joint swelling, arthralgias and gait problem.  Skin: Negative for color change, rash and wound.   Neurological: Positive for light-headedness. Negative for dizziness, tremors, seizures, syncope, speech difficulty, weakness, numbness and headaches.  Psychiatric/Behavioral: Negative for behavioral problems, confusion, dysphoric mood, decreased concentration and agitation. The patient is nervous/anxious.        Objective:   Physical Exam  Constitutional: He is oriented to person, place, and time. He appears well-developed and well-nourished. No distress.  HENT:  Head: Normocephalic and atraumatic.  Right Ear: External ear normal.  Left Ear: External ear normal.  Nose: Nose normal.  Mouth/Throat: Oropharynx is clear and moist. No oropharyngeal exudate.  Eyes: Conjunctivae and EOM are normal. Pupils are equal, round, and reactive to light. Right eye exhibits no discharge. Left eye exhibits no discharge. No scleral icterus.  Neck: Normal range of motion. Neck supple. No tracheal deviation present. No thyromegaly present.  Cardiovascular: Normal rate, regular rhythm and normal heart sounds.  Exam reveals no gallop and no friction rub.   No murmur heard. Pulmonary/Chest: Effort normal. No accessory muscle usage. Not tachypneic. No respiratory distress. He has no decreased breath sounds. He has no wheezes. He has rhonchi (few scattered). He has no rales. He exhibits no tenderness.  Musculoskeletal: Normal range of motion. He exhibits no edema.  Lymphadenopathy:    He has no cervical adenopathy.  Neurological: He is alert and oriented to person, place, and time. No cranial nerve deficit. Coordination normal.  Skin: Skin is warm and dry. No rash noted. He is not diaphoretic. No erythema. No pallor.  Psychiatric: He has a normal mood and affect. His behavior is normal. Judgment and thought content normal.          Assessment & Plan:

## 2012-06-04 ENCOUNTER — Telehealth: Payer: Self-pay | Admitting: *Deleted

## 2012-06-04 NOTE — Telephone Encounter (Signed)
Attempted to call Dr. Luther Redo back. No answer. Left message.

## 2012-06-04 NOTE — Telephone Encounter (Signed)
Dr. Wynelle Link from [facility] would like to discuss Mr. Nordhoff with you.

## 2012-06-15 ENCOUNTER — Encounter: Payer: Self-pay | Admitting: Internal Medicine

## 2012-06-15 ENCOUNTER — Ambulatory Visit (INDEPENDENT_AMBULATORY_CARE_PROVIDER_SITE_OTHER)
Admission: RE | Admit: 2012-06-15 | Discharge: 2012-06-15 | Disposition: A | Discharge status: Home / Self Care | Payer: Medicare Other | Source: Ambulatory Visit | Attending: Internal Medicine | Admitting: Internal Medicine

## 2012-06-15 ENCOUNTER — Ambulatory Visit (INDEPENDENT_AMBULATORY_CARE_PROVIDER_SITE_OTHER): Payer: Medicare Other | Admitting: Internal Medicine

## 2012-06-15 VITALS — BP 112/72 | HR 83 | Temp 98.4°F | Wt 182.0 lb

## 2012-06-15 DIAGNOSIS — E2749 Other adrenocortical insufficiency: Secondary | ICD-10-CM

## 2012-06-15 DIAGNOSIS — J209 Acute bronchitis, unspecified: Secondary | ICD-10-CM

## 2012-06-15 DIAGNOSIS — W57XXXA Bitten or stung by nonvenomous insect and other nonvenomous arthropods, initial encounter: Secondary | ICD-10-CM | POA: Insufficient documentation

## 2012-06-15 DIAGNOSIS — T148 Other injury of unspecified body region: Secondary | ICD-10-CM

## 2012-06-15 DIAGNOSIS — R0989 Other specified symptoms and signs involving the circulatory and respiratory systems: Secondary | ICD-10-CM

## 2012-06-15 DIAGNOSIS — E871 Hypo-osmolality and hyponatremia: Secondary | ICD-10-CM

## 2012-06-15 DIAGNOSIS — E2740 Unspecified adrenocortical insufficiency: Secondary | ICD-10-CM

## 2012-06-15 DIAGNOSIS — I10 Essential (primary) hypertension: Secondary | ICD-10-CM

## 2012-06-15 MED ORDER — DOXYCYCLINE HYCLATE 100 MG PO TABS: 100.0000 mg | ORAL_TABLET | Freq: Two times a day (BID) | ORAL | Status: DC

## 2012-06-15 NOTE — Assessment & Plan Note (Addendum)
Discussed with nephrologist. Some recent labs in their office suggest glucocorticoid deficiency as cause of hyponatremia, so will allow patient more leniency with fluids. He will discuss potential addition of Hydrocortisone at follow up visit.  Over of which >50% spent in face-to-face contact with patient discussing plan of care

## 2012-06-15 NOTE — Assessment & Plan Note (Signed)
Symptoms and exam are consistent with acute bronchitis. Will treat with doxycycline and continue inhaled bronchodilators. Patient is intolerant to oral prednisone. Chest x-ray today showed no acute infiltrate. Will monitor closely with followup next week.

## 2012-06-15 NOTE — Assessment & Plan Note (Signed)
Persistent labile blood pressures , with more episodes of hypotension recently with fluid restriction. Will discuss with his nephrologist. Question if he might benefit from increase in volume of fluid restriction.

## 2012-06-15 NOTE — Progress Notes (Signed)
Subjective:    Patient ID: Johnny Sanford, male    DOB: 08-14-1940, 72 y.o.   MRN: [id number]  HPI 72 year old male with history of essential thrombocythemia, labile hypertension, adrenal insufficiency, SIADH, chronic back pain presents for followup. He reports he is generally been feeling poorly. He reports fatigue and frequent episodes of low blood pressure, as low as 70s over 40s. During these episodes he feels weak and lightheaded. His wife typically gets them several salt tablet with some improvement. He has been restricting his fluid intake to 32 ounces daily. This has been difficult for him. He also has had some episodes of elevated blood pressure greater than 160/100.  Over the last couple of days, he notes increased productive cough with purulent sputum. He notes occasional mild shortness of breath. He denies chest pain, fever, chills. He is not currently taking anything for this. He has been out of his Symbicort and cannot afford it.  He also notes that his sister passed away overnight last night. She had metastatic ovarian cancer.  Outpatient Encounter Prescriptions as of 06/15/2012  Medication Sig Dispense Refill  . anagrelide (AGRYLIN) 0.5 MG capsule Take 1 mg by mouth 2 (two) times daily. Take 4 tablets in the morning and 4 tablets at night      . aspirin 81 MG EC tablet Take 162 mg by mouth daily.       . budesonide-formoterol (SYMBICORT) 160-4.5 MCG/ACT inhaler Inhale 2 puffs into the lungs 2 (two) times daily.  1 Inhaler  3  . Calcium Carb-Cholecalciferol (CALCIUM 1000 + D PO) Take 1 tablet by mouth daily.        . citalopram (CELEXA) 10 MG tablet Take 1 tablet (10 mg total) by mouth daily.  30 tablet  3  . Cyanocobalamin (VITAMIN B-12 PO) Take by mouth daily.      . fludrocortisone (FLORINEF) 0.1 MG tablet Take 0.5 tablets (0.05 mg total) by mouth daily.  90 tablet  3  . fluorouracil (EFUDEX) 5 % cream Apply topically as needed.      . fluticasone (FLONASE) 50 MCG/ACT nasal spray  Place 1 spray into the nose daily as needed for rhinitis.  16 g  3  . hydrALAZINE (APRESOLINE) 25 MG tablet Take 1 tablet (25 mg total) by mouth 3 (three) times daily. Use as needed for BP >160/100  90 tablet  3  . levothyroxine (SYNTHROID, LEVOTHROID) 137 MCG tablet Take 1 tablet (137 mcg total) by mouth daily.  90 tablet  3  . mometasone (ELOCON) 0.1 % lotion       . OMEGA-3 1000 MG CAPS Take 1 capsule by mouth daily.        Marland Kitchen omeprazole (PRILOSEC) 20 MG capsule Take 1 capsule (20 mg total) by mouth daily.  90 capsule  3  . simvastatin (ZOCOR) 20 MG tablet Take 1 tablet (20 mg total) by mouth daily.  90 tablet  3  . traZODone (DESYREL) 50 MG tablet Take 0.5-1 tablets (25-50 mg total) by mouth at bedtime as needed for sleep.  30 tablet  3  . ALPRAZolam (XANAX) 0.5 MG tablet       . atenolol (TENORMIN) 25 MG tablet       . doxycycline (VIBRA-TABS) 100 MG tablet Take 1 tablet (100 mg total) by mouth 2 (two) times daily.  20 tablet  0  . gabapentin (NEURONTIN) 100 MG capsule       . NITROSTAT 0.4 MG SL tablet  No facility-administered encounter medications on file as of 06/15/2012.   BP 112/72  Pulse 83  Temp(Src) 98.4 F (36.9 C) (Oral)  Wt 182 lb (82.555 kg)  BMI 30.29 kg/m2  SpO2 91%  Review of Systems  Constitutional: Positive for fatigue. Negative for fever, chills, activity change, appetite change and unexpected weight change.  HENT: Negative for congestion, rhinorrhea, sneezing, postnasal drip and sinus pressure.   Eyes: Negative for visual disturbance.  Respiratory: Positive for cough and shortness of breath.   Cardiovascular: Negative for chest pain, palpitations and leg swelling.  Gastrointestinal: Negative for abdominal pain and abdominal distention.  Genitourinary: Negative for dysuria, urgency and difficulty urinating.  Musculoskeletal: Positive for myalgias and arthralgias. Negative for gait problem.  Skin: Negative for color change and rash.  Neurological: Positive  for weakness and light-headedness.  Hematological: Negative for adenopathy.  Psychiatric/Behavioral: Negative for sleep disturbance and dysphoric mood. The patient is not nervous/anxious.        Objective:   Physical Exam  Constitutional: He is oriented to person, place, and time. He appears well-developed and well-nourished. No distress.  HENT:  Head: Normocephalic and atraumatic.  Right Ear: External ear normal.  Left Ear: External ear normal.  Nose: Nose normal.  Mouth/Throat: Oropharynx is clear and moist. No oropharyngeal exudate.  Eyes: Conjunctivae and EOM are normal. Pupils are equal, round, and reactive to light. Right eye exhibits no discharge. Left eye exhibits no discharge. No scleral icterus.  Neck: Normal range of motion. Neck supple. No tracheal deviation present. No thyromegaly present.  Cardiovascular: Normal rate, regular rhythm and normal heart sounds.  Exam reveals no gallop and no friction rub.   No murmur heard. Pulmonary/Chest: Effort normal. No accessory muscle usage. Not tachypneic. No respiratory distress. He has decreased breath sounds. He has wheezes. He has rhonchi in the right middle field and the right lower field. He has no rales. He exhibits no tenderness.  Musculoskeletal: Normal range of motion. He exhibits no edema.  Lymphadenopathy:    He has no cervical adenopathy.  Neurological: He is alert and oriented to person, place, and time. No cranial nerve deficit. Coordination normal.  Skin: Skin is warm and dry. No rash noted. He is not diaphoretic. There is erythema (left axilla at site of recent tick bite). No pallor.  Psychiatric: He has a normal mood and affect. His behavior is normal. Judgment and thought content normal.          Assessment & Plan:

## 2012-06-15 NOTE — Assessment & Plan Note (Signed)
Recent tick bite to left axilla. Slight erythema at the site but no evidence for frank cellulitis and no symptoms to suggest tickborne illness such as [facility] spotted fever. Discussed symptoms to be aware of such as fever, headache, rash. Given current acute bronchitis, will treat with doxycycline.

## 2012-06-15 NOTE — Assessment & Plan Note (Signed)
Recent labs consistent with SIADH. Fluid restriction in place. Has follow up with nephrology this week, with repeat labs. Will follow.

## 2012-06-23 LAB — CBC CANCER CENTER
Basophil %: 1.4 %
Eosinophil #: 0.1 x10 3/mm (ref 0.0–0.7)
Eosinophil %: 1.4 %
HGB: 11.4 g/dL — ABNORMAL LOW (ref 13.0–18.0)
Lymphocyte #: 1.1 x10 3/mm (ref 1.0–3.6)
Lymphocyte %: 13.6 %
MCH: 32.9 pg (ref 26.0–34.0)
MCHC: 33 g/dL (ref 32.0–36.0)
MCV: 100 fL (ref 80–100)
Monocyte #: 1 x10 3/mm (ref 0.2–1.0)
Monocyte %: 12.6 %
Neutrophil #: 5.8 x10 3/mm (ref 1.4–6.5)
Neutrophil %: 71 %
Platelet: 621 x10 3/mm — ABNORMAL HIGH (ref 150–440)

## 2012-06-27 ENCOUNTER — Ambulatory Visit: Payer: Self-pay | Admitting: Oncology

## 2012-06-29 ENCOUNTER — Encounter: Payer: Self-pay | Admitting: Internal Medicine

## 2012-06-29 ENCOUNTER — Ambulatory Visit (INDEPENDENT_AMBULATORY_CARE_PROVIDER_SITE_OTHER): Payer: Medicare Other | Admitting: Internal Medicine

## 2012-06-29 VITALS — BP 98/62 | HR 77 | Temp 98.2°F | Wt 184.0 lb

## 2012-06-29 DIAGNOSIS — M542 Cervicalgia: Secondary | ICD-10-CM | POA: Insufficient documentation

## 2012-06-29 DIAGNOSIS — R0989 Other specified symptoms and signs involving the circulatory and respiratory systems: Secondary | ICD-10-CM

## 2012-06-29 DIAGNOSIS — E871 Hypo-osmolality and hyponatremia: Secondary | ICD-10-CM

## 2012-06-29 DIAGNOSIS — I499 Cardiac arrhythmia, unspecified: Secondary | ICD-10-CM | POA: Insufficient documentation

## 2012-06-29 DIAGNOSIS — I10 Essential (primary) hypertension: Secondary | ICD-10-CM

## 2012-06-29 MED ORDER — CYCLOBENZAPRINE HCL 5 MG PO TABS: 5.0000 mg | ORAL_TABLET | Freq: Three times a day (TID) | ORAL | Status: DC | PRN

## 2012-06-29 NOTE — Assessment & Plan Note (Signed)
Exam remarkable for irregular heart beat. EKG shows PACs. Will continue to monitor. Avoiding use of betablocker given h/o hypotension.

## 2012-06-29 NOTE — Assessment & Plan Note (Signed)
Persistent symptoms of labile blood pressure. Currently followed by nephrology, endocrinology, and cardiology. Question if he would benefit from adding hydrocortisone to regimen. He has follow up with nephrology next week to discuss this. Will continue to monitor.

## 2012-06-29 NOTE — Assessment & Plan Note (Signed)
Symptoms of muscular pain in the upper neck consistent with spasm. Will restart Flexeril 5 mg up to 3 times daily as needed for pain.

## 2012-06-29 NOTE — Assessment & Plan Note (Signed)
Consistent with adrenal insufficiency. Recent sodium level was improved. Repeat labs scheduled for 2 weeks with nephrology.

## 2012-06-29 NOTE — Progress Notes (Signed)
Subjective:    Patient ID: Johnny Sanford, male    DOB: 10/14/1940, 72 y.o.   MRN: [id number]  HPI 72 year old male with history of essential thrombocythemia, adrenal insufficiency, labile hypertension, hyperlipidemia, COPD presents for followup. He reports that his blood pressure continues to be labile ranging from the 70s over 40s to greater than 170/100. He was instructed by his nephrologist to stop using hydralazine for elevated blood pressure. He reports that his blood pressure will typically come down over about one hour when elevated. He denies any recent chest pain, shortness of breath, palpitations.  He has been limiting his fluid intake to 2 L per day. Recent sodium level was improved at 130. He has followup with his nephrologist in 2 weeks. There's been some discussion about adding a second corticosteroid to his regimen.  He is concerned today about intermittent pain in his upper back and neck. This is described as spasm-like pain. He was improved in the past with use of Flexeril.  Outpatient Encounter Prescriptions as of 06/29/2012  Medication Sig Dispense Refill  . anagrelide (AGRYLIN) 0.5 MG capsule Take 1 mg by mouth 2 (two) times daily. Take 4 tablets in the morning and 4 tablets at night      . aspirin 81 MG EC tablet Take 162 mg by mouth daily.       . budesonide-formoterol (SYMBICORT) 160-4.5 MCG/ACT inhaler Inhale 2 puffs into the lungs 2 (two) times daily.  1 Inhaler  3  . Calcium Carb-Cholecalciferol (CALCIUM 1000 + D PO) Take 1 tablet by mouth daily.        . citalopram (CELEXA) 10 MG tablet Take 1 tablet (10 mg total) by mouth daily.  30 tablet  3  . Cyanocobalamin (VITAMIN B-12 PO) Take by mouth daily.      . fludrocortisone (FLORINEF) 0.1 MG tablet Take 0.5 tablets (0.05 mg total) by mouth daily.  90 tablet  3  . fluorouracil (EFUDEX) 5 % cream Apply topically as needed.      . fluticasone (FLONASE) 50 MCG/ACT nasal spray Place 1 spray into the nose daily as needed for  rhinitis.  16 g  3  . levothyroxine (SYNTHROID, LEVOTHROID) 137 MCG tablet Take 1 tablet (137 mcg total) by mouth daily.  90 tablet  3  . mometasone (ELOCON) 0.1 % lotion       . neomycin-polymyxin-hydrocortisone (CORTISPORIN) 3.5-10000-1 otic suspension       . OMEGA-3 1000 MG CAPS Take 1 capsule by mouth daily.        Marland Kitchen omeprazole (PRILOSEC) 20 MG capsule Take 1 capsule (20 mg total) by mouth daily.  90 capsule  3  . simvastatin (ZOCOR) 20 MG tablet Take 1 tablet (20 mg total) by mouth daily.  90 tablet  3  . traZODone (DESYREL) 50 MG tablet Take 0.5-1 tablets (25-50 mg total) by mouth at bedtime as needed for sleep.  30 tablet  3  . ALPRAZolam (XANAX) 0.5 MG tablet       . atenolol (TENORMIN) 25 MG tablet       . cyclobenzaprine (FLEXERIL) 5 MG tablet Take 1 tablet (5 mg total) by mouth 3 (three) times daily as needed for muscle spasms.  30 tablet  1  . gabapentin (NEURONTIN) 100 MG capsule       . hydrALAZINE (APRESOLINE) 25 MG tablet Take 1 tablet (25 mg total) by mouth 3 (three) times daily. Use as needed for BP >160/100  90 tablet  3  . NITROSTAT 0.4  MG SL tablet       . [DISCONTINUED] doxycycline (VIBRA-TABS) 100 MG tablet Take 1 tablet (100 mg total) by mouth 2 (two) times daily.  20 tablet  0   No facility-administered encounter medications on file as of 06/29/2012.   BP 98/62  Pulse 77  Temp(Src) 98.2 F (36.8 C) (Oral)  Wt 184 lb (83.462 kg)  BMI 30.62 kg/m2  SpO2 90%  Review of Systems  Constitutional: Positive for fatigue. Negative for fever, chills, activity change, appetite change and unexpected weight change.  HENT: Positive for neck pain and neck stiffness.   Eyes: Negative for visual disturbance.  Respiratory: Negative for cough and shortness of breath.   Cardiovascular: Negative for chest pain, palpitations and leg swelling.  Gastrointestinal: Negative for abdominal pain and abdominal distention.  Genitourinary: Negative for dysuria, urgency and difficulty  urinating.  Musculoskeletal: Positive for back pain. Negative for arthralgias and gait problem.  Skin: Negative for color change and rash.  Hematological: Negative for adenopathy.  Psychiatric/Behavioral: Negative for sleep disturbance and dysphoric mood. The patient is not nervous/anxious.        Objective:   Physical Exam  Constitutional: He is oriented to person, place, and time. He appears well-developed and well-nourished. No distress.  HENT:  Head: Normocephalic and atraumatic.  Right Ear: External ear normal.  Left Ear: External ear normal.  Nose: Nose normal.  Mouth/Throat: Oropharynx is clear and moist. No oropharyngeal exudate.  Eyes: Conjunctivae and EOM are normal. Pupils are equal, round, and reactive to light. Right eye exhibits no discharge. Left eye exhibits no discharge. No scleral icterus.  Neck: Normal range of motion. Neck supple. No tracheal deviation present. No thyromegaly present.  Cardiovascular: Normal rate and normal heart sounds.  An irregular rhythm present.  Extrasystoles are present. Exam reveals no gallop and no friction rub.   No murmur heard. Pulmonary/Chest: Effort normal and breath sounds normal. No respiratory distress. He has no wheezes. He has no rales. He exhibits no tenderness.  Musculoskeletal: Normal range of motion. He exhibits no edema.  Lymphadenopathy:    He has no cervical adenopathy.  Neurological: He is alert and oriented to person, place, and time. No cranial nerve deficit. Coordination normal.  Skin: Skin is warm and dry. No rash noted. He is not diaphoretic. No erythema. No pallor.  Psychiatric: He has a normal mood and affect. His behavior is normal. Judgment and thought content normal.          Assessment & Plan:

## 2012-07-09 ENCOUNTER — Encounter: Payer: Self-pay | Admitting: Internal Medicine

## 2012-07-21 ENCOUNTER — Encounter: Payer: Self-pay | Admitting: Internal Medicine

## 2012-07-23 ENCOUNTER — Encounter: Payer: Self-pay | Admitting: Nephrology

## 2012-07-29 ENCOUNTER — Encounter: Payer: Self-pay | Admitting: Internal Medicine

## 2012-08-04 ENCOUNTER — Ambulatory Visit: Payer: Self-pay | Admitting: Oncology

## 2012-08-04 LAB — CBC CANCER CENTER
Eosinophil #: 0.2 x10 3/mm (ref 0.0–0.7)
Eosinophil %: 3.1 %
HCT: 36.6 % — ABNORMAL LOW (ref 40.0–52.0)
Lymphocyte #: 1.2 x10 3/mm (ref 1.0–3.6)
Lymphocyte %: 15.8 %
MCH: 30.9 pg (ref 26.0–34.0)
MCHC: 33.9 g/dL (ref 32.0–36.0)
Platelet: 1077 x10 3/mm — ABNORMAL HIGH (ref 150–440)
RBC: 4.02 10*6/uL — ABNORMAL LOW (ref 4.40–5.90)
RDW: 14.7 % — ABNORMAL HIGH (ref 11.5–14.5)
WBC: 7.7 x10 3/mm (ref 3.8–10.6)

## 2012-08-05 ENCOUNTER — Encounter: Payer: Self-pay | Admitting: Internal Medicine

## 2012-08-05 ENCOUNTER — Ambulatory Visit (INDEPENDENT_AMBULATORY_CARE_PROVIDER_SITE_OTHER): Payer: Medicare Other | Admitting: Internal Medicine

## 2012-08-05 VITALS — BP 90/62 | HR 100 | Temp 98.3°F | Wt 179.0 lb

## 2012-08-05 DIAGNOSIS — I10 Essential (primary) hypertension: Secondary | ICD-10-CM

## 2012-08-05 DIAGNOSIS — R0989 Other specified symptoms and signs involving the circulatory and respiratory systems: Secondary | ICD-10-CM

## 2012-08-05 DIAGNOSIS — R5381 Other malaise: Secondary | ICD-10-CM

## 2012-08-05 DIAGNOSIS — D473 Essential (hemorrhagic) thrombocythemia: Secondary | ICD-10-CM

## 2012-08-05 DIAGNOSIS — R5383 Other fatigue: Secondary | ICD-10-CM

## 2012-08-05 DIAGNOSIS — E2740 Unspecified adrenocortical insufficiency: Secondary | ICD-10-CM

## 2012-08-05 DIAGNOSIS — E2749 Other adrenocortical insufficiency: Secondary | ICD-10-CM

## 2012-08-05 NOTE — Progress Notes (Signed)
Subjective:    Patient ID: Johnny Sanford, male    DOB: October 13, 1940, 72 y.o.   MRN: [id number]  HPI 72 year old male with history of labile hypertension, essential thrombocythemia, hypothyroidism, adrenal insufficiency presents for followup. He reports blood pressure continues to be quite labile. He has episodes when his blood pressure is less than 80/50. These are associated with fatigue and lightheadedness. He has not had any syncopal episodes. He has stopped taking any blood pressure medication. He also has episodes of elevated blood pressure greater than 150/100. He was recently seen in followup by his nephrologist and had a renal ultrasound. He is unsure results of this. He was also recently seen by his hematologist and labs reportedly showed elevated platelet count at 1000. His dose of anagrelide was increased. He will followup again in 4 weeks. He is concerned about progressive fatigue and inability to perform activities of daily living. He has also lost about 5 pounds over the last 2 months. He denies change in appetite. He denies focal symptoms such as shortness of breath, chest pain, or change in bowel habits.  Outpatient Encounter Prescriptions as of 08/05/2012  Medication Sig Dispense Refill  . anagrelide (AGRYLIN) 0.5 MG capsule Take 1 mg by mouth 2 (two) times daily. Take 4 tablets in the morning and 4 tablets at night      . aspirin 81 MG EC tablet Take 162 mg by mouth daily.       . budesonide-formoterol (SYMBICORT) 160-4.5 MCG/ACT inhaler Inhale 2 puffs into the lungs 2 (two) times daily.  1 Inhaler  3  . Calcium Carb-Cholecalciferol (CALCIUM 1000 + D PO) Take 1 tablet by mouth daily.        . citalopram (CELEXA) 10 MG tablet Take 1 tablet (10 mg total) by mouth daily.  30 tablet  3  . Cyanocobalamin (VITAMIN B-12 PO) Take by mouth daily.      . cyclobenzaprine (FLEXERIL) 5 MG tablet Take 1 tablet (5 mg total) by mouth 3 (three) times daily as needed for muscle spasms.  30 tablet  1  .  fludrocortisone (FLORINEF) 0.1 MG tablet Take 0.5 tablets (0.05 mg total) by mouth daily.  90 tablet  3  . fluorouracil (EFUDEX) 5 % cream Apply topically as needed.      . fluticasone (FLONASE) 50 MCG/ACT nasal spray Place 1 spray into the nose daily as needed for rhinitis.  16 g  3  . gabapentin (NEURONTIN) 100 MG capsule       . hydrALAZINE (APRESOLINE) 25 MG tablet Take 1 tablet (25 mg total) by mouth 3 (three) times daily. Use as needed for BP >160/100  90 tablet  3  . levothyroxine (SYNTHROID, LEVOTHROID) 137 MCG tablet Take 1 tablet (137 mcg total) by mouth daily.  90 tablet  3  . mometasone (ELOCON) 0.1 % lotion       . neomycin-polymyxin-hydrocortisone (CORTISPORIN) 3.5-10000-1 otic suspension       . NITROSTAT 0.4 MG SL tablet       . OMEGA-3 1000 MG CAPS Take 1 capsule by mouth daily.        Marland Kitchen omeprazole (PRILOSEC) 20 MG capsule Take 1 capsule (20 mg total) by mouth daily.  90 capsule  3  . simvastatin (ZOCOR) 20 MG tablet Take 1 tablet (20 mg total) by mouth daily.  90 tablet  3  . traZODone (DESYREL) 50 MG tablet Take 0.5-1 tablets (25-50 mg total) by mouth at bedtime as needed for sleep.  30 tablet  3  . [DISCONTINUED] ALPRAZolam (XANAX) 0.5 MG tablet       . [DISCONTINUED] atenolol (TENORMIN) 25 MG tablet        No facility-administered encounter medications on file as of 08/05/2012.   BP 90/62  Pulse 100  Temp(Src) 98.3 F (36.8 C) (Oral)  Wt 179 lb (81.194 kg)  BMI 29.79 kg/m2  SpO2 91%  Review of Systems  Constitutional: Positive for activity change, fatigue and unexpected weight change. Negative for fever, chills and appetite change.  HENT: Positive for neck pain (intermittent posterior neck).   Eyes: Negative for visual disturbance.  Respiratory: Negative for cough and shortness of breath.   Cardiovascular: Negative for chest pain, palpitations and leg swelling.  Gastrointestinal: Negative for abdominal pain and abdominal distention.  Genitourinary: Negative for  dysuria, urgency and difficulty urinating.  Musculoskeletal: Negative for arthralgias and gait problem.  Skin: Negative for color change and rash.  Hematological: Negative for adenopathy.  Psychiatric/Behavioral: Negative for sleep disturbance and dysphoric mood. The patient is not nervous/anxious.        Objective:   Physical Exam  Constitutional: He is oriented to person, place, and time. He appears well-developed and well-nourished. No distress.  HENT:  Head: Normocephalic and atraumatic.  Right Ear: External ear normal.  Left Ear: External ear normal.  Nose: Nose normal.  Mouth/Throat: Oropharynx is clear and moist. No oropharyngeal exudate.  Eyes: Conjunctivae and EOM are normal. Pupils are equal, round, and reactive to light. Right eye exhibits no discharge. Left eye exhibits no discharge. No scleral icterus.  Neck: Normal range of motion. Neck supple. No tracheal deviation present. No thyromegaly present.  Cardiovascular: Normal rate, regular rhythm and normal heart sounds.  Exam reveals no gallop and no friction rub.   No murmur heard. Pulmonary/Chest: Effort normal and breath sounds normal. No respiratory distress. He has no wheezes. He has no rales. He exhibits no tenderness.  Musculoskeletal: Normal range of motion. He exhibits no edema.  Lymphadenopathy:    He has no cervical adenopathy.  Neurological: He is alert and oriented to person, place, and time. No cranial nerve deficit. Coordination normal.  Skin: Skin is warm and dry. No rash noted. He is not diaphoretic. No erythema. No pallor.  Psychiatric: He has a normal mood and affect. His behavior is normal. Judgment and thought content normal.          Assessment & Plan:

## 2012-08-05 NOTE — Assessment & Plan Note (Signed)
Will request records from his hematologist. Note recent dose of anagrelide for persistent elevation of platelet count.

## 2012-08-05 NOTE — Assessment & Plan Note (Signed)
Markedly labile blood pressure. 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 Reviewed literature in regards to this today.  Hypertension. 2003 Jul; 42(1): 110-6 describes similar patients with late sequelae from neck irradiation. Recommended that he wear compression hose and continue to increase salt intake as needed with episodes of hypotension. Will also follow up with his neurologist. Will continue Hydralazine prn BP>160/100.  Over of which >50% spent in face-to-face contact with patient discussing plan of care

## 2012-08-05 NOTE — Assessment & Plan Note (Signed)
Likely multifactorial, in part related to markedly labile blood pressure and in part related to ongoing treatment of essential thrombocythemia. Reviewed recent CT chest which showed nodular abnormalities. Discussed need for follow up this month. Pt hematologist planning to schedule repeat CT chest.

## 2012-08-05 NOTE — Assessment & Plan Note (Signed)
Given symptoms of hypotension, question if addition of hydrocortisone might be helpful. Will discuss with his nephrologist.

## 2012-08-12 DIAGNOSIS — Z79899 Other long term (current) drug therapy: Secondary | ICD-10-CM

## 2012-08-13 ENCOUNTER — Encounter: Payer: Self-pay | Admitting: Internal Medicine

## 2012-08-18 ENCOUNTER — Ambulatory Visit: Payer: Self-pay | Admitting: Nephrology

## 2012-08-23 ENCOUNTER — Telehealth: Payer: Self-pay | Admitting: *Deleted

## 2012-08-23 LAB — CBC CANCER CENTER
Basophil #: 0 x10 3/mm (ref 0.0–0.1)
Eosinophil #: 0.1 x10 3/mm (ref 0.0–0.7)
Eosinophil %: 1 %
HCT: 36.4 % — ABNORMAL LOW (ref 40.0–52.0)
HGB: 12.2 g/dL — ABNORMAL LOW (ref 13.0–18.0)
Lymphocyte #: 1.3 x10 3/mm (ref 1.0–3.6)
MCHC: 33.6 g/dL (ref 32.0–36.0)
Monocyte %: 11.6 %
Neutrophil %: 72 %
RDW: 14.9 % — ABNORMAL HIGH (ref 11.5–14.5)
WBC: 8.8 x10 3/mm (ref 3.8–10.6)

## 2012-08-23 NOTE — Telephone Encounter (Signed)
Patient wife called stating the pharmacist at [facility] told her there maybe an interaction with Johnny Sanford's medications. The platelet medication and antidepressant medication may affect his heart. She would like to know what needs to be done for him to discontinuing the antidepressant medication. He began taking the antidepressant in April and has been on the platelet medication since March. Please advise

## 2012-08-23 NOTE — Telephone Encounter (Signed)
Left message to call back  

## 2012-08-23 NOTE — Telephone Encounter (Signed)
Johnny Sanford returned call.  Please call [phone]

## 2012-08-23 NOTE — Telephone Encounter (Signed)
Informed patient wife Johnny Sanford and she verbalized understanding

## 2012-08-23 NOTE — Telephone Encounter (Signed)
We can have him stop the Citalopram, as he is on a low dose. We should make sure he has follow up within next few months (I think already scheduled)

## 2012-08-23 NOTE — Telephone Encounter (Signed)
It is a low dose. I think he could stop "cold [place]," however if he wants to taper, he could divide the pill in half and take 5mg  daily x 1 week, then stop.

## 2012-08-23 NOTE — Telephone Encounter (Signed)
Spoke with patient wife, she would like to know how you want him to taper off of it, the pharmacist said he couldn't quit cold [place]. Also what can he take for the depression, he has some Alprazolam at home.

## 2012-08-27 ENCOUNTER — Ambulatory Visit: Payer: Self-pay | Admitting: Oncology

## 2012-08-31 ENCOUNTER — Encounter: Payer: Self-pay | Admitting: Cardiovascular Disease

## 2012-08-31 ENCOUNTER — Ambulatory Visit (INDEPENDENT_AMBULATORY_CARE_PROVIDER_SITE_OTHER): Payer: Medicare Other | Admitting: Cardiovascular Disease

## 2012-08-31 VITALS — BP 132/94 | HR 74 | Ht 65.0 in | Wt 177.8 lb

## 2012-08-31 DIAGNOSIS — E785 Hyperlipidemia, unspecified: Secondary | ICD-10-CM

## 2012-08-31 DIAGNOSIS — R42 Dizziness and giddiness: Secondary | ICD-10-CM

## 2012-08-31 DIAGNOSIS — R0989 Other specified symptoms and signs involving the circulatory and respiratory systems: Secondary | ICD-10-CM

## 2012-08-31 DIAGNOSIS — I499 Cardiac arrhythmia, unspecified: Secondary | ICD-10-CM

## 2012-08-31 DIAGNOSIS — R9431 Abnormal electrocardiogram [ECG] [EKG]: Secondary | ICD-10-CM

## 2012-08-31 DIAGNOSIS — I10 Essential (primary) hypertension: Secondary | ICD-10-CM

## 2012-08-31 MED ORDER — ISOSORBIDE DINITRATE 20 MG PO TABS: 20.0000 mg | ORAL_TABLET | Freq: Three times a day (TID) | ORAL | Status: DC | PRN

## 2012-08-31 NOTE — Assessment & Plan Note (Signed)
Cholesterol is at goal on the current lipid regimen. No changes to the medications were made.  

## 2012-08-31 NOTE — Patient Instructions (Addendum)
  For high blood pressures, take hydralazine as needed, ok to repeat once or twice For blood pressure than does not decrease, take isosorbide one pill  Please call us if you have new issues that need to be addressed before your next appt.  Your physician wants you to follow-up in: 6 months.  You will receive a reminder letter in the mail two months in advance. If you don't receive a letter, please call our office to schedule the follow-up appointment.

## 2012-08-31 NOTE — Assessment & Plan Note (Signed)
Recent EKG showing short runs of sinus tachycardia, APCs. He is relatively asymptomatic today have resolved at this time. No further workup needed.

## 2012-08-31 NOTE — Assessment & Plan Note (Signed)
Florinef seems to be providing a floor for his low blood pressures, with no recent dizziness from hypotension. He does have occasional hypertension which she successfully treats with hydralazine one or 2 tablets.  We have suggested he continue this and if blood pressure does not improve on hydralazine doses, he could take isosorbide dinitrate when necessary to avoid unnecessary trips to the emergency room.

## 2012-08-31 NOTE — Assessment & Plan Note (Signed)
No significant complaints of dizziness on today's visit

## 2012-08-31 NOTE — Progress Notes (Signed)
Patient ID: Johnny Sanford, male    DOB: Oct 31, 1940, 72 y.o.   MRN: [id number]  HPI Comments: 72 year old gentleman, patient of Dr. Ronna Polio, with history of GERD, CVA, carotid arterial disease with  carotid endarterectomy on the left in 2003 with 60% disease on the right, hypothyroidism, very labile blood pressures, Started on Florinef by a [facility] physician, history of throat cancer with radiation , who presents for routine followup Admission to the hospital April 2014 for hyponatremia, severe hypertension that resolved on its own after initially using blood pressure medications   He reports that his blood pressures continued to be labile. Sometimes elevated though this is often corrected with hydralazine one or 2 doses. No dizzy headed spells or passout spells. He reports that the Florinef seems to be working and rarely has extremely low blood pressures with symptoms. He has not been taking blood pressure medications on a regular basis, only when necessary Recent CT scan of the abdomen showed cysts in both kidneys  He does report having a problem after recent antiplatelet/chemotherapy medications. This caused malaise, out the patient's. As the medication was held and symptoms seemed to have resolved. He did go to the hospital for this and EKG in the hospital 08/12/2012 showed normal sinus rhythm with runs of atrial tachycardia, APCs heart rate 100 beats per minute  He was admitted to the hospital 01/12/2012 with confusion, slurred speech, rule out for CVA, sodium was 126   Carotid ultrasound showed no significant disease on the left, 60% blockage on the right Interestingly carotid ultrasound December 2013 at [facility] showed atherosclerotic disease with no significant stenosis He was told that he has spinal stenosis  Total cholesterol 137, LDL 62, HDL 60 on December 2013  EKG shows normal sinus rhythm with rate 74 beats per minute with no significant ST or T wave changes     Outpatient  Encounter Prescriptions as of 08/31/2012  Medication Sig Dispense Refill  . ALPRAZolam (XANAX) 0.5 MG tablet Take 0.5 mg by mouth 3 (three) times daily as needed for sleep.      Marland Kitchen aspirin 81 MG EC tablet Take 162 mg by mouth daily.       . budesonide-formoterol (SYMBICORT) 160-4.5 MCG/ACT inhaler Inhale 2 puffs into the lungs 2 (two) times daily.  1 Inhaler  3  . Calcium Carb-Cholecalciferol (CALCIUM 1000 + D PO) Take 1 tablet by mouth daily.        . Cyanocobalamin (VITAMIN B-12 PO) Take by mouth daily.      . cyclobenzaprine (FLEXERIL) 5 MG tablet Take 1 tablet (5 mg total) by mouth 3 (three) times daily as needed for muscle spasms.  30 tablet  1  . fludrocortisone (FLORINEF) 0.1 MG tablet Take 0.5 tablets (0.05 mg total) by mouth daily.  90 tablet  3  . fluorouracil (EFUDEX) 5 % cream Apply topically as needed.      . fluticasone (FLONASE) 50 MCG/ACT nasal spray Place 1 spray into the nose daily as needed for rhinitis.  16 g  3  . gabapentin (NEURONTIN) 100 MG capsule       . hydrALAZINE (APRESOLINE) 25 MG tablet Take 1 tablet (25 mg total) by mouth 3 (three) times daily. Use as needed for BP >160/100  90 tablet  3  . hydroxyurea (HYDREA) 500 MG capsule Take 4 capsules daily. May take with food to minimize GI side effects.      Marland Kitchen levothyroxine (SYNTHROID, LEVOTHROID) 137 MCG tablet Take 1 tablet (137 mcg  total) by mouth daily.  90 tablet  3  . mometasone (ELOCON) 0.1 % lotion       . neomycin-polymyxin-hydrocortisone (CORTISPORIN) 3.5-10000-1 otic suspension       . NITROSTAT 0.4 MG SL tablet       . OMEGA-3 1000 MG CAPS Take 1 capsule by mouth daily.        Marland Kitchen omeprazole (PRILOSEC) 20 MG capsule Take 1 capsule (20 mg total) by mouth daily.  90 capsule  3  . simvastatin (ZOCOR) 20 MG tablet Take 1 tablet (20 mg total) by mouth daily.  90 tablet  3  . traZODone (DESYREL) 50 MG tablet Take 0.5-1 tablets (25-50 mg total) by mouth at bedtime as needed for sleep.  30 tablet  3  . [DISCONTINUED]  anagrelide (AGRYLIN) 0.5 MG capsule Take 1 mg by mouth 2 (two) times daily. Take 4 tablets in the morning and 4 tablets at night      . [DISCONTINUED] citalopram (CELEXA) 10 MG tablet Take 1 tablet (10 mg total) by mouth daily.  30 tablet  3   No facility-administered encounter medications on file as of 08/31/2012.     Review of Systems  Constitutional: Negative.   HENT: Negative.   Eyes: Negative.   Respiratory: Negative.   Cardiovascular: Negative.   Gastrointestinal: Negative.   Musculoskeletal: Negative.   Skin: Negative.   Psychiatric/Behavioral: Negative.   All other systems reviewed and are negative.    BP 132/94  Pulse 74  Ht 5\' 5"  (1.651 m)  Wt 177 lb 12 oz (80.627 kg)  BMI 29.58 kg/m2  Physical Exam  Nursing note and vitals reviewed. Constitutional: He is oriented to person, place, and time. He appears well-developed and well-nourished.  HENT:  Head: Normocephalic.  Nose: Nose normal.  Mouth/Throat: Oropharynx is clear and moist.  Eyes: Conjunctivae are normal. Pupils are equal, round, and reactive to light.  Neck: Normal range of motion. Neck supple. No JVD present.  Cardiovascular: Normal rate, regular rhythm, S1 normal, S2 normal and intact distal pulses.  Exam reveals no gallop and no friction rub.   Murmur heard.  Crescendo systolic murmur is present with a grade of 1/6  Pulmonary/Chest: Effort normal and breath sounds normal. No respiratory distress. He has no wheezes. He has no rales. He exhibits no tenderness.  Abdominal: Soft. Bowel sounds are normal. He exhibits no distension. There is no tenderness.  Musculoskeletal: Normal range of motion. He exhibits no edema and no tenderness.  Lymphadenopathy:    He has no cervical adenopathy.  Neurological: He is alert and oriented to person, place, and time. Coordination normal.  Skin: Skin is warm and dry. No rash noted. No erythema.  Psychiatric: He has a normal mood and affect. His behavior is normal. Judgment  and thought content normal.      Assessment and Plan

## 2012-09-08 LAB — CBC CANCER CENTER
Basophil #: 0.1 x10 3/mm (ref 0.0–0.1)
Eosinophil #: 0.1 x10 3/mm (ref 0.0–0.7)
HGB: 11.2 g/dL — ABNORMAL LOW (ref 13.0–18.0)
Lymphocyte #: 1 x10 3/mm (ref 1.0–3.6)
Lymphocyte %: 18.1 %
MCH: 30.5 pg (ref 26.0–34.0)
MCHC: 33.2 g/dL (ref 32.0–36.0)
MCV: 92 fL (ref 80–100)
Monocyte #: 0.6 x10 3/mm (ref 0.2–1.0)
Neutrophil #: 3.9 x10 3/mm (ref 1.4–6.5)
Platelet: 1145 x10 3/mm — ABNORMAL HIGH (ref 150–440)
RBC: 3.67 10*6/uL — ABNORMAL LOW (ref 4.40–5.90)
WBC: 5.7 x10 3/mm (ref 3.8–10.6)

## 2012-09-22 LAB — CBC CANCER CENTER
Basophil #: 0.1 x10 3/mm (ref 0.0–0.1)
Basophil %: 1.9 %
Eosinophil #: 0.1 x10 3/mm (ref 0.0–0.7)
Eosinophil %: 2.1 %
HCT: 33.4 % — ABNORMAL LOW (ref 40.0–52.0)
HGB: 11.2 g/dL — ABNORMAL LOW (ref 13.0–18.0)
Lymphocyte #: 0.7 x10 3/mm — ABNORMAL LOW (ref 1.0–3.6)
Neutrophil #: 2.1 x10 3/mm (ref 1.4–6.5)
Neutrophil %: 64.4 %
RBC: 3.56 10*6/uL — ABNORMAL LOW (ref 4.40–5.90)

## 2012-09-24 ENCOUNTER — Telehealth: Payer: Self-pay | Admitting: Internal Medicine

## 2012-09-24 NOTE — Telephone Encounter (Signed)
Pt was seen by his doctor for his kidneys.  States last potassium was 5.7, and they want to get it down.  Only info they were given for diet was a paper with a list of good choices and bad choices.  Asking if Dr. Dan Humphreys has more information they could use to plan a diet for the patient.

## 2012-09-24 NOTE — Telephone Encounter (Signed)
We can give him a handout on a low-potassium diet, however his potassium is likely high related to medication and adrenal issues. We should make sure K is repeated next week.

## 2012-09-24 NOTE — Telephone Encounter (Signed)
Informed patient wife of instructions, he will have his K level rechecked next week by his kidney doctor.

## 2012-09-24 NOTE — Telephone Encounter (Signed)
Fwd to Dr. Walker 

## 2012-09-27 ENCOUNTER — Ambulatory Visit: Payer: Self-pay | Admitting: Oncology

## 2012-09-28 ENCOUNTER — Other Ambulatory Visit: Payer: Self-pay | Admitting: *Deleted

## 2012-09-28 MED ORDER — OMEPRAZOLE 20 MG PO CPDR: 20.0000 mg | DELAYED_RELEASE_CAPSULE | Freq: Every day | ORAL | Status: DC

## 2012-09-28 NOTE — Telephone Encounter (Signed)
Eprescribed.

## 2012-10-06 ENCOUNTER — Telehealth: Payer: Self-pay | Admitting: Internal Medicine

## 2012-10-06 ENCOUNTER — Encounter: Payer: Self-pay | Admitting: Internal Medicine

## 2012-10-06 LAB — CBC CANCER CENTER
Basophil #: 0 x10 3/mm (ref 0.0–0.1)
Eosinophil #: 0.1 x10 3/mm (ref 0.0–0.7)
Eosinophil %: 1.6 %
HCT: 34 % — ABNORMAL LOW (ref 40.0–52.0)
HGB: 11.3 g/dL — ABNORMAL LOW (ref 13.0–18.0)
Lymphocyte %: 20.6 %
MCHC: 33.3 g/dL (ref 32.0–36.0)
MCV: 97 fL (ref 80–100)
Neutrophil %: 66.6 %
Platelet: 354 x10 3/mm (ref 150–440)
RBC: 3.51 10*6/uL — ABNORMAL LOW (ref 4.40–5.90)
WBC: 3.3 x10 3/mm — ABNORMAL LOW (ref 3.8–10.6)

## 2012-10-06 NOTE — Telephone Encounter (Signed)
Pt was at [facility] and they were wanting him to get a [facility] to [facility] to see Dietician to lower his potassium

## 2012-10-06 NOTE — Telephone Encounter (Signed)
Fwd to Dr. Walker 

## 2012-10-07 NOTE — Telephone Encounter (Signed)
Let's make a visit to discuss

## 2012-10-07 NOTE — Telephone Encounter (Signed)
Spoke with patient wife, she state she called [facility] this morning and they told her to call her insurance company. Because if he does not have diabetes or renal failure more than likely they will not pay for it. She is also going to discuss this further with Dr. Eartha Inch to see what his diagnostic is.

## 2012-10-11 ENCOUNTER — Other Ambulatory Visit: Payer: Self-pay | Admitting: *Deleted

## 2012-10-11 DIAGNOSIS — G4700 Insomnia, unspecified: Secondary | ICD-10-CM

## 2012-10-11 MED ORDER — TRAZODONE HCL 50 MG PO TABS: 25.0000 mg | ORAL_TABLET | Freq: Every evening | ORAL | Status: DC | PRN

## 2012-10-25 ENCOUNTER — Ambulatory Visit (INDEPENDENT_AMBULATORY_CARE_PROVIDER_SITE_OTHER): Payer: Medicare Other | Admitting: Internal Medicine

## 2012-10-25 ENCOUNTER — Encounter: Payer: Self-pay | Admitting: Internal Medicine

## 2012-10-25 VITALS — BP 150/92 | HR 80 | Temp 98.1°F | Wt 174.0 lb

## 2012-10-25 DIAGNOSIS — D473 Essential (hemorrhagic) thrombocythemia: Secondary | ICD-10-CM

## 2012-10-25 DIAGNOSIS — E2749 Other adrenocortical insufficiency: Secondary | ICD-10-CM

## 2012-10-25 DIAGNOSIS — E2740 Unspecified adrenocortical insufficiency: Secondary | ICD-10-CM

## 2012-10-25 MED ORDER — FLUOROURACIL 5 % EX CREA: TOPICAL_CREAM | CUTANEOUS | Status: DC | PRN

## 2012-10-25 NOTE — Assessment & Plan Note (Signed)
Followed by Dr. Orlie Dakin. Pt reports improvement with change from Anagrelide to Hydroxyurea. Will continue to monitor.

## 2012-10-25 NOTE — Assessment & Plan Note (Signed)
Symptoms improved with more stable BP. Continue Fludrocortisone.Continue low potassium diet. Continue to follow with nephrology.

## 2012-10-25 NOTE — Progress Notes (Signed)
Subjective:    Patient ID: Johnny Sanford, male    DOB: 10-15-1940, 72 y.o.   MRN: [id number]  HPI 72 year old male with history of adrenal insufficiency, essential thrombocythemia, COPD presents for followup. He reports that he is feeling much better than over the last few months. He was changed back to hydroxyurea and has had some improvement in platelet counts with last platelet count near 350. He denies side effects from this medication.  He was recently seen by his nephrologist in sodium levels have been more stable. Potassium was slightly elevated and he has been placed on a low potassium diet. He continues on Florinef. He has not recently needed to use hydralazine. He has not recently had any persistently elevated blood pressures or persistently low blood pressures.  Outpatient Encounter Prescriptions as of 10/25/2012  Medication Sig Dispense Refill  . ALPRAZolam (XANAX) 0.5 MG tablet Take 0.5 mg by mouth 3 (three) times daily as needed for sleep.      Marland Kitchen aspirin 81 MG EC tablet Take 162 mg by mouth daily.       . budesonide-formoterol (SYMBICORT) 160-4.5 MCG/ACT inhaler Inhale 2 puffs into the lungs 2 (two) times daily.  1 Inhaler  3  . Calcium Carb-Cholecalciferol (CALCIUM 1000 + D PO) Take 1 tablet by mouth daily.        . Cyanocobalamin (VITAMIN B-12 PO) Take by mouth daily.      . fludrocortisone (FLORINEF) 0.1 MG tablet Take 0.5 tablets (0.05 mg total) by mouth daily.  90 tablet  3  . fluorouracil (EFUDEX) 5 % cream Apply topically as needed.  40 g  1  . fluticasone (FLONASE) 50 MCG/ACT nasal spray Place 1 spray into the nose daily as needed for rhinitis.  16 g  3  . hydrALAZINE (APRESOLINE) 25 MG tablet Take 1 tablet (25 mg total) by mouth 3 (three) times daily. Use as needed for BP >160/100  90 tablet  3  . hydroxyurea (HYDREA) 500 MG capsule Take 4 capsules daily. May take with food to minimize GI side effects.      . isosorbide dinitrate (ISORDIL) 20 MG tablet Take 1 tablet (20 mg  total) by mouth 3 (three) times daily as needed.  90 tablet  3  . levothyroxine (SYNTHROID, LEVOTHROID) 137 MCG tablet Take 1 tablet (137 mcg total) by mouth daily.  90 tablet  3  . mometasone (ELOCON) 0.1 % lotion       . OMEGA-3 1000 MG CAPS Take 1 capsule by mouth daily.        Marland Kitchen omeprazole (PRILOSEC) 20 MG capsule Take 1 capsule (20 mg total) by mouth daily.  90 capsule  1  . simvastatin (ZOCOR) 20 MG tablet Take 1 tablet (20 mg total) by mouth daily.  90 tablet  3  . traZODone (DESYREL) 50 MG tablet Take 0.5-1 tablets (25-50 mg total) by mouth at bedtime as needed for sleep.  30 tablet  3  . neomycin-polymyxin-hydrocortisone (CORTISPORIN) 3.5-10000-1 otic suspension        No facility-administered encounter medications on file as of 10/25/2012.   BP 150/92  Pulse 80  Temp(Src) 98.1 F (36.7 C) (Oral)  Wt 174 lb (78.926 kg)  BMI 28.96 kg/m2  SpO2 97%  Review of Systems  Constitutional: Negative for fever, chills, activity change, appetite change, fatigue and unexpected weight change.  Eyes: Negative for visual disturbance.  Respiratory: Negative for cough and shortness of breath.   Cardiovascular: Negative for chest pain, palpitations and leg  swelling.  Gastrointestinal: Negative for abdominal pain and abdominal distention.  Genitourinary: Negative for dysuria, urgency and difficulty urinating.  Musculoskeletal: Negative for arthralgias and gait problem.  Skin: Negative for color change and rash.  Hematological: Negative for adenopathy.  Psychiatric/Behavioral: Negative for sleep disturbance and dysphoric mood. The patient is not nervous/anxious.        Objective:   Physical Exam  Constitutional: He is oriented to person, place, and time. He appears well-developed and well-nourished. No distress.  HENT:  Head: Normocephalic and atraumatic.  Right Ear: External ear normal.  Left Ear: External ear normal.  Nose: Nose normal.  Mouth/Throat: Oropharynx is clear and moist. No  oropharyngeal exudate.  Eyes: Conjunctivae and EOM are normal. Pupils are equal, round, and reactive to light. Right eye exhibits no discharge. Left eye exhibits no discharge. No scleral icterus.  Neck: Normal range of motion. Neck supple. No tracheal deviation present. No thyromegaly present.  Cardiovascular: Normal rate, regular rhythm and normal heart sounds.  Exam reveals no gallop and no friction rub.   No murmur heard. Pulmonary/Chest: Effort normal and breath sounds normal. No respiratory distress. He has no wheezes. He has no rales. He exhibits no tenderness.  Musculoskeletal: Normal range of motion. He exhibits no edema.  Lymphadenopathy:    He has no cervical adenopathy.  Neurological: He is alert and oriented to person, place, and time. No cranial nerve deficit. Coordination normal.  Skin: Skin is warm and dry. No rash noted. He is not diaphoretic. No erythema. No pallor.  Psychiatric: He has a normal mood and affect. His behavior is normal. Judgment and thought content normal.          Assessment & Plan:

## 2012-10-27 ENCOUNTER — Ambulatory Visit: Payer: Self-pay | Admitting: Oncology

## 2012-10-28 ENCOUNTER — Ambulatory Visit: Payer: Self-pay | Admitting: Nephrology

## 2012-11-03 ENCOUNTER — Ambulatory Visit: Payer: Self-pay | Admitting: Internal Medicine

## 2012-11-03 ENCOUNTER — Telehealth: Payer: Self-pay | Admitting: Internal Medicine

## 2012-11-03 ENCOUNTER — Other Ambulatory Visit: Payer: Self-pay | Admitting: *Deleted

## 2012-11-03 LAB — CBC CANCER CENTER
Basophil %: 0.8 %
Eosinophil #: 0 x10 3/mm (ref 0.0–0.7)
Eosinophil %: 0.9 %
HCT: 29.9 % — ABNORMAL LOW (ref 40.0–52.0)
MCH: 35.6 pg — ABNORMAL HIGH (ref 26.0–34.0)
MCHC: 34.1 g/dL (ref 32.0–36.0)
MCV: 105 fL — ABNORMAL HIGH (ref 80–100)
Monocyte #: 0.4 x10 3/mm (ref 0.2–1.0)
Monocyte %: 15.6 %
Neutrophil #: 1.3 x10 3/mm — ABNORMAL LOW (ref 1.4–6.5)
Platelet: 411 x10 3/mm (ref 150–440)
RBC: 2.86 10*6/uL — ABNORMAL LOW (ref 4.40–5.90)
RDW: 29.8 % — ABNORMAL HIGH (ref 11.5–14.5)

## 2012-11-03 NOTE — Telephone Encounter (Signed)
I don't see where Dr Dan Humphreys has ever refilled this medication for this pt.  Who has been prescribing?  Does he have enough to get through until she returns?  How often is he taking?

## 2012-11-03 NOTE — Telephone Encounter (Signed)
Left vm.  States pt came in today with lab order for TSH.  Needs diagnosis.

## 2012-11-04 ENCOUNTER — Telehealth: Payer: Self-pay | Admitting: Internal Medicine

## 2012-11-04 MED ORDER — ALPRAZOLAM 0.5 MG PO TABS: 0.5000 mg | ORAL_TABLET | Freq: Every day | ORAL | Status: DC | PRN

## 2012-11-04 NOTE — Telephone Encounter (Signed)
I see this was addressed by Dr. Lorin Picket yesterday so I will handle it from there.

## 2012-11-04 NOTE — Telephone Encounter (Signed)
Dr. Dan Humphreys did prescribe this for him in March this year.

## 2012-11-04 NOTE — Telephone Encounter (Signed)
I refilled #30 tablets of the xanax with no refills.

## 2012-11-04 NOTE — Telephone Encounter (Signed)
I accidentally printed refill for 300 xanax.  That rx voided.  Printed rx for xanax .5mg  daily prn #30 with no refills.

## 2012-11-04 NOTE — Telephone Encounter (Signed)
See my note

## 2012-11-04 NOTE — Telephone Encounter (Signed)
Could you refill this for him, just let me know.

## 2012-11-04 NOTE — Telephone Encounter (Signed)
See the other questions that I had regarding how often he is taking and does he have enough to get him through until she returns.  Thanks.

## 2012-11-04 NOTE — Telephone Encounter (Signed)
Spoke with patient wife she state he is completely out of his medication and he can take it up to 3 times a day but he usually only takes it once a day.

## 2012-11-04 NOTE — Telephone Encounter (Signed)
States pharmacy faxed request for alprazolam 0.5 mg.  Pt is completely out.  They are going out of town on Monday.

## 2012-11-05 NOTE — Telephone Encounter (Signed)
Left message to call back  

## 2012-11-05 NOTE — Telephone Encounter (Signed)
Prescription was printed, signed by Dr. Lorin Picket and faxed to pharmacy.

## 2012-11-09 ENCOUNTER — Ambulatory Visit: Payer: Medicare Other | Admitting: Internal Medicine

## 2012-11-09 NOTE — Telephone Encounter (Signed)
They did draw him and labs were already given to you.

## 2012-11-15 ENCOUNTER — Encounter: Payer: Self-pay | Admitting: Internal Medicine

## 2012-11-15 MED ORDER — ALPRAZOLAM 0.5 MG PO TABS: 0.5000 mg | ORAL_TABLET | Freq: Three times a day (TID) | ORAL | Status: DC | PRN

## 2012-11-22 ENCOUNTER — Encounter: Payer: Self-pay | Admitting: Internal Medicine

## 2012-11-23 NOTE — Telephone Encounter (Signed)
I don't have any available appointment slots to put Johnny Sanford. Can you designate an appointment time for him.

## 2012-11-23 NOTE — Telephone Encounter (Signed)
Johnny Sanford called back.  I was able to schedule Johnny Sanford 10/29 at 11:15 a.m.

## 2012-11-24 ENCOUNTER — Ambulatory Visit (INDEPENDENT_AMBULATORY_CARE_PROVIDER_SITE_OTHER): Payer: Medicare Other | Admitting: Internal Medicine

## 2012-11-24 ENCOUNTER — Encounter: Payer: Self-pay | Admitting: Internal Medicine

## 2012-11-24 VITALS — BP 142/100 | HR 86 | Temp 98.0°F | Wt 171.0 lb

## 2012-11-24 DIAGNOSIS — J449 Chronic obstructive pulmonary disease, unspecified: Secondary | ICD-10-CM

## 2012-11-24 DIAGNOSIS — M542 Cervicalgia: Secondary | ICD-10-CM

## 2012-11-24 DIAGNOSIS — G8929 Other chronic pain: Secondary | ICD-10-CM

## 2012-11-24 NOTE — Progress Notes (Signed)
Subjective:    Patient ID: Johnny Sanford, male    DOB: 04-Nov-1940, 72 y.o.   MRN: [id number]  HPI  71 year old male with history of adrenal insufficiency, COPD, rheumatoid arthritis, head and neck cancer status post radiation presents for acute visit complaining of gradually worsening pain in his neck and upper back. For several years he has had progressive weakness in his upper back with curvature in his upper spine. Over the last several months, he has developed progressively worsening pain in his upper back and shoulders. He has been taking an occasional Aleve with minimal improvement. He recently went and bought a neck brace to better support his head. He reports some improvement with this. He denies numbness or pain in his arms. He denies weakness in his arms.   Outpatient Encounter Prescriptions as of 11/24/2012  Medication Sig Dispense Refill  . ALPRAZolam (XANAX) 0.5 MG tablet Take 1 tablet (0.5 mg total) by mouth 3 (three) times daily as needed for sleep or anxiety.  90 tablet  3  . aspirin 81 MG EC tablet Take 162 mg by mouth daily.       . budesonide-formoterol (SYMBICORT) 160-4.5 MCG/ACT inhaler Inhale 2 puffs into the lungs 2 (two) times daily.  1 Inhaler  3  . Calcium Carb-Cholecalciferol (CALCIUM 1000 + D PO) Take 1 tablet by mouth daily.        . Cyanocobalamin (VITAMIN B-12 PO) Take by mouth daily.      . Ferrous Sulfate (IRON) 325 (65 FE) MG TABS Take by mouth daily.      . fludrocortisone (FLORINEF) 0.1 MG tablet Take 0.5 tablets (0.05 mg total) by mouth daily.  90 tablet  3  . fluorouracil (EFUDEX) 5 % cream Apply topically as needed.  40 g  1  . fluticasone (FLONASE) 50 MCG/ACT nasal spray Place 1 spray into the nose daily as needed for rhinitis.  16 g  3  . hydrALAZINE (APRESOLINE) 25 MG tablet Take 1 tablet (25 mg total) by mouth 3 (three) times daily. Use as needed for BP >160/100  90 tablet  3  . hydroxyurea (HYDREA) 500 MG capsule Take 5 capsules daily. May take with  food to minimize GI side effects.      . isosorbide dinitrate (ISORDIL) 20 MG tablet Take 1 tablet (20 mg total) by mouth 3 (three) times daily as needed.  90 tablet  3  . levothyroxine (SYNTHROID, LEVOTHROID) 137 MCG tablet Take 1 tablet (137 mcg total) by mouth daily.  90 tablet  3  . mometasone (ELOCON) 0.1 % lotion       . neomycin-polymyxin-hydrocortisone (CORTISPORIN) 3.5-10000-1 otic suspension       . OMEGA-3 1000 MG CAPS Take 1 capsule by mouth daily.        Marland Kitchen omeprazole (PRILOSEC) 20 MG capsule Take 1 capsule (20 mg total) by mouth daily.  90 capsule  1  . simvastatin (ZOCOR) 20 MG tablet Take 1 tablet (20 mg total) by mouth daily.  90 tablet  3  . traZODone (DESYREL) 50 MG tablet Take 0.5-1 tablets (25-50 mg total) by mouth at bedtime as needed for sleep.  30 tablet  3   No facility-administered encounter medications on file as of 11/24/2012.   BP 142/100  Pulse 86  Temp(Src) 98 F (36.7 C) (Oral)  Wt 171 lb (77.565 kg)  BMI 28.46 kg/m2  SpO2 97%  Review of Systems  Constitutional: Positive for fatigue. Negative for fever, chills, activity change, appetite change and  unexpected weight change.  Eyes: Negative for visual disturbance.  Respiratory: Positive for cough. Negative for shortness of breath.   Cardiovascular: Negative for chest pain, palpitations and leg swelling.  Gastrointestinal: Negative for abdominal pain and abdominal distention.  Genitourinary: Negative for dysuria, urgency and difficulty urinating.  Musculoskeletal: Positive for arthralgias, back pain, myalgias and neck pain. Negative for gait problem.  Skin: Negative for color change and rash.  Hematological: Negative for adenopathy.  Psychiatric/Behavioral: Negative for sleep disturbance and dysphoric mood. The patient is not nervous/anxious.        Objective:   Physical Exam  Constitutional: He is oriented to person, place, and time. He appears well-developed and well-nourished. No distress.  HENT:    Head: Normocephalic and atraumatic.  Right Ear: External ear normal.  Left Ear: External ear normal.  Nose: Nose normal.  Mouth/Throat: Oropharynx is clear and moist. No oropharyngeal exudate.  Eyes: Conjunctivae and EOM are normal. Pupils are equal, round, and reactive to light. Right eye exhibits no discharge. Left eye exhibits no discharge. No scleral icterus.  Neck: Normal range of motion. Neck supple. Muscular tenderness present. No spinous process tenderness present. No rigidity. No tracheal deviation, no erythema and normal range of motion present. No thyromegaly present.    Cardiovascular: Normal rate, regular rhythm and normal heart sounds.  Exam reveals no gallop and no friction rub.   No murmur heard. Pulmonary/Chest: Effort normal. No accessory muscle usage. Not tachypneic. No respiratory distress. He has wheezes. He has rhonchi. He has no rales. He exhibits no tenderness.  Musculoskeletal: Normal range of motion. He exhibits no edema.  Lymphadenopathy:    He has no cervical adenopathy.  Neurological: He is alert and oriented to person, place, and time. No cranial nerve deficit. Coordination normal.  Skin: Skin is warm and dry. No rash noted. He is not diaphoretic. No erythema. No pallor.  Psychiatric: He has a normal mood and affect. His behavior is normal. Judgment and thought content normal.          Assessment & Plan:

## 2012-11-24 NOTE — Assessment & Plan Note (Signed)
Wheezing and rhonchi noted on exam today, however he denies change in chronic cough or dyspnea. Patient has been out of his medication, Symbicort. We do not have samples of Symbicort available at this time. Will get staples of Advair 250/50 for him to use in the interim until he can afford Symbicort or samples available. If symptoms are not improving with Advair, he will call or return to clinic.

## 2012-11-24 NOTE — Assessment & Plan Note (Signed)
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 Question if he might benefit from physical therapy and neck brace. Will set up physical therapy evaluation. Continue Aleve or Tylenol as needed for pain. Encouraged him to use Aleve sparingly given history of thrombocythemia.

## 2012-11-25 ENCOUNTER — Encounter: Payer: Self-pay | Admitting: Internal Medicine

## 2012-11-25 DIAGNOSIS — M542 Cervicalgia: Secondary | ICD-10-CM

## 2012-11-27 ENCOUNTER — Ambulatory Visit: Payer: Self-pay | Admitting: Oncology

## 2012-11-27 ENCOUNTER — Ambulatory Visit: Payer: Self-pay | Admitting: Nephrology

## 2012-12-17 ENCOUNTER — Encounter: Payer: Self-pay | Admitting: *Deleted

## 2012-12-20 ENCOUNTER — Encounter: Payer: Self-pay | Admitting: Internal Medicine

## 2012-12-20 ENCOUNTER — Ambulatory Visit (INDEPENDENT_AMBULATORY_CARE_PROVIDER_SITE_OTHER): Payer: Medicare Other | Admitting: Internal Medicine

## 2012-12-20 VITALS — BP 130/86 | HR 81 | Temp 98.1°F | Resp 12 | Ht 65.0 in | Wt 173.5 lb

## 2012-12-20 DIAGNOSIS — R413 Other amnesia: Secondary | ICD-10-CM

## 2012-12-20 DIAGNOSIS — E2740 Unspecified adrenocortical insufficiency: Secondary | ICD-10-CM

## 2012-12-20 DIAGNOSIS — G8929 Other chronic pain: Secondary | ICD-10-CM

## 2012-12-20 DIAGNOSIS — E2749 Other adrenocortical insufficiency: Secondary | ICD-10-CM

## 2012-12-20 DIAGNOSIS — M542 Cervicalgia: Secondary | ICD-10-CM

## 2012-12-20 NOTE — Assessment & Plan Note (Signed)
Ongoing mild short term memory loss. Discussed referral to [facility] for additional testing and/or use of Aricept, however will hold for now.

## 2012-12-20 NOTE — Progress Notes (Signed)
Subjective:    Patient ID: Johnny Sanford, male    DOB: [birth year], 72 y.o.   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 He reports that things have been relatively stable. He was seen by orthopedic surgery in regards to his neck and he was told he is not a candidate for surgery. He has been doing strengthening exercises and occasionally uses a soft brace with some improvement.  He notes ongoing short-term memory loss. His wife noticed occasional confusion. However, he does not wish to start medication at this time.  In regards to chronic hyponatremia, he was recently seen by his nephrologist. Sodium level was 131.  In regards to essential thrombocythemia, he is alternating hydroxyurea 5 capsules daily x1 week with a 4 capsules daily times one week. He has scheduled followup with his hematologist next week.  Outpatient Encounter Prescriptions as of 12/20/2012  Medication Sig  . ALPRAZolam (XANAX) 0.5 MG tablet Take 1 tablet (0.5 mg total) by mouth 3 (three) times daily as needed for sleep or anxiety.  Marland Kitchen aspirin 81 MG EC tablet Take 162 mg by mouth daily.   . budesonide-formoterol (SYMBICORT) 160-4.5 MCG/ACT inhaler Inhale 2 puffs into the lungs 2 (two) times daily.  . Calcium Carb-Cholecalciferol (CALCIUM 1000 + D PO) Take 1 tablet by mouth daily.    . Cyanocobalamin (VITAMIN B-12 PO) Take by mouth daily.  . cyclobenzaprine (FLEXERIL) 5 MG tablet Take 5 mg by mouth 3 (three) times daily as needed for muscle spasms.  . Ferrous Sulfate (IRON) 325 (65 FE) MG TABS Take by mouth daily.  . fludrocortisone (FLORINEF) 0.1 MG tablet Take 0.5 tablets (0.05 mg total) by mouth daily.  . fluorouracil (EFUDEX) 5 % cream Apply topically as needed.  . fluticasone (FLONASE) 50 MCG/ACT nasal spray Place 1 spray into the nose daily as needed for rhinitis.   . hydrALAZINE (APRESOLINE) 25 MG tablet Take 1 tablet (25 mg total) by mouth 3 (three) times daily. Use as needed for BP >160/100  . hydroxyurea (HYDREA) 500 MG capsule Take 5 capsules daily. May take with food to minimize GI side effects.  . isosorbide dinitrate (ISORDIL) 20 MG tablet Take 1 tablet (20 mg total) by mouth 3 (three) times daily as needed.  Marland Kitchen levothyroxine (SYNTHROID, LEVOTHROID) 137 MCG tablet Take 1 tablet (137 mcg total) by mouth daily.  . mometasone (ELOCON) 0.1 % lotion   . neomycin-polymyxin-hydrocortisone (CORTISPORIN) 3.5-10000-1 otic suspension   . OMEGA-3 1000 MG CAPS Take 1 capsule by mouth daily.    Marland Kitchen omeprazole (PRILOSEC) 20 MG capsule Take 1 capsule (20 mg total) by mouth daily.  . simvastatin (ZOCOR) 20 MG tablet Take 1 tablet (20 mg total) by mouth daily.  . traZODone (DESYREL) 50 MG tablet Take 0.5-1 tablets (25-50 mg total) by mouth at bedtime as needed for sleep.   BP 130/86  Pulse 81  Temp(Src) 98.1 F (36.7 C) (Oral)  Resp 12  Ht 5\' 5"  (1.651 m)  Wt 173 lb 8 oz (78.699 kg)  BMI 28.87 kg/m2  SpO2 98%  Review of Systems  Constitutional: Positive for fatigue. Negative for fever, chills, activity change, appetite change and unexpected weight change.  Eyes: Negative for visual disturbance.  Respiratory: Positive for cough. Negative for shortness of breath.   Cardiovascular: Negative for chest pain, palpitations and leg swelling.  Gastrointestinal: Negative for abdominal pain and abdominal distention.  Genitourinary: Negative for dysuria, urgency and difficulty urinating.  Musculoskeletal: Positive for arthralgias and neck pain. Negative for gait problem.  Skin: Negative for color change and rash.  Hematological: Negative for adenopathy.  Psychiatric/Behavioral: Positive for confusion (intermittent) and decreased concentration. Negative for sleep disturbance and dysphoric mood. The patient is not nervous/anxious.        Objective:   Physical Exam   Constitutional: He is oriented to person, place, and time. He appears well-developed and well-nourished. No distress.  HENT:  Head: Normocephalic and atraumatic.  Right Ear: External ear normal.  Left Ear: External ear normal.  Nose: Nose normal.  Mouth/Throat: Oropharynx is clear and moist. No oropharyngeal exudate.  Eyes: Conjunctivae and EOM are normal. Pupils are equal, round, and reactive to light. Right eye exhibits no discharge. Left eye exhibits no discharge. No scleral icterus.  Neck: Neck supple. Muscular tenderness (kyphosis) present. No spinous process tenderness present. Decreased range of motion present. No tracheal deviation present. No thyromegaly present.  Cardiovascular: Normal rate, regular rhythm and normal heart sounds.  Exam reveals no gallop and no friction rub.   No murmur heard. Pulmonary/Chest: Effort normal and breath sounds normal. No respiratory distress. He has no wheezes. He has no rales. He exhibits no tenderness.  Musculoskeletal: He exhibits no edema.  Lymphadenopathy:    He has no cervical adenopathy.  Neurological: He is alert and oriented to person, place, and time. No cranial nerve deficit. Coordination normal.  Skin: Skin is warm and dry. No rash noted. He is not diaphoretic. No erythema. No pallor.  Psychiatric: He has a normal mood and affect. His behavior is normal. Judgment and thought content normal.          Assessment & Plan:

## 2012-12-20 NOTE — Assessment & Plan Note (Signed)
Symptomatically doing well on current medication, Florinef. Followed by nephrology. Reviewed recent sodium which was 131.

## 2012-12-20 NOTE — Assessment & Plan Note (Signed)
S/p recent evaluation with orthopedics. Pain and kyphosis secondary to RA and h/o XRT neck. Not a candidate for surgery. Encouraged use of soft neck brace as needed. Encouraged continued PT exercises.

## 2012-12-20 NOTE — Progress Notes (Signed)
Pre visit review using our clinic review tool, if applicable. No additional management support is needed unless otherwise documented below in the visit note. 

## 2012-12-22 ENCOUNTER — Ambulatory Visit: Payer: Medicare Other | Admitting: Internal Medicine

## 2013-01-05 ENCOUNTER — Ambulatory Visit: Payer: Self-pay | Admitting: Oncology

## 2013-01-05 LAB — CBC CANCER CENTER
Basophil #: 0 x10 3/mm (ref 0.0–0.1)
Eosinophil #: 0 x10 3/mm (ref 0.0–0.7)
Eosinophil %: 1.3 %
HCT: 31.9 % — ABNORMAL LOW (ref 40.0–52.0)
Lymphocyte #: 0.6 x10 3/mm — ABNORMAL LOW (ref 1.0–3.6)
MCH: 46.2 pg — ABNORMAL HIGH (ref 26.0–34.0)
MCHC: 35.4 g/dL (ref 32.0–36.0)
Monocyte #: 0.4 x10 3/mm (ref 0.2–1.0)
Neutrophil %: 51.8 %
Platelet: 424 x10 3/mm (ref 150–440)
RDW: 14.2 % (ref 11.5–14.5)

## 2013-01-21 ENCOUNTER — Other Ambulatory Visit: Payer: Self-pay | Admitting: Internal Medicine

## 2013-01-21 MED ORDER — LEVOTHYROXINE SODIUM 137 MCG PO TABS: 137.0000 ug | ORAL_TABLET | Freq: Every day | ORAL | Status: DC

## 2013-01-21 NOTE — Telephone Encounter (Signed)
Rx sent to pharmacy   

## 2013-01-24 ENCOUNTER — Ambulatory Visit: Payer: Medicare Other | Admitting: Internal Medicine

## 2013-01-27 ENCOUNTER — Ambulatory Visit: Payer: Self-pay | Admitting: Oncology

## 2013-02-01 ENCOUNTER — Telehealth: Payer: Self-pay | Admitting: *Deleted

## 2013-02-01 NOTE — Telephone Encounter (Signed)
Left detailed message on Johnny Sanford's voicemail with instructions from Dr. Gilford Rile on what to do.

## 2013-02-01 NOTE — Telephone Encounter (Signed)
Prednisone taper would be fine. He will need to monitor BP closely and also let his endocrinologist, Dr. Loanne Drilling, know.

## 2013-02-01 NOTE — Telephone Encounter (Signed)
Would like to know if it would be ok for Dr. Pryor Ochoa to give him a 6 day Prednisone taper?

## 2013-02-04 ENCOUNTER — Encounter: Payer: Self-pay | Admitting: *Deleted

## 2013-02-07 ENCOUNTER — Encounter: Payer: Self-pay | Admitting: Internal Medicine

## 2013-02-07 ENCOUNTER — Ambulatory Visit (INDEPENDENT_AMBULATORY_CARE_PROVIDER_SITE_OTHER): Payer: Medicare Other | Admitting: Internal Medicine

## 2013-02-07 VITALS — BP 110/90 | HR 77 | Temp 98.2°F | Wt 172.0 lb

## 2013-02-07 DIAGNOSIS — E2740 Unspecified adrenocortical insufficiency: Secondary | ICD-10-CM

## 2013-02-07 DIAGNOSIS — E2749 Other adrenocortical insufficiency: Secondary | ICD-10-CM

## 2013-02-07 DIAGNOSIS — M542 Cervicalgia: Secondary | ICD-10-CM

## 2013-02-07 DIAGNOSIS — I10 Essential (primary) hypertension: Secondary | ICD-10-CM

## 2013-02-07 DIAGNOSIS — R0989 Other specified symptoms and signs involving the circulatory and respiratory systems: Secondary | ICD-10-CM

## 2013-02-07 DIAGNOSIS — D473 Essential (hemorrhagic) thrombocythemia: Secondary | ICD-10-CM

## 2013-02-07 DIAGNOSIS — G8929 Other chronic pain: Secondary | ICD-10-CM

## 2013-02-07 DIAGNOSIS — E785 Hyperlipidemia, unspecified: Secondary | ICD-10-CM

## 2013-02-07 DIAGNOSIS — E871 Hypo-osmolality and hyponatremia: Secondary | ICD-10-CM

## 2013-02-07 LAB — LIPID PANEL
Cholesterol: 146 mg/dL (ref 0–200)
HDL: 68.7 mg/dL (ref >39.00)
LDL CALC: 70 mg/dL (ref 0–99)
Total CHOL/HDL Ratio: 2
Triglycerides: 39 mg/dL (ref 0.0–149.0)
VLDL: 7.8 mg/dL (ref 0.0–40.0)

## 2013-02-07 LAB — COMPREHENSIVE METABOLIC PANEL
ALBUMIN: 3.7 g/dL (ref 3.5–5.2)
ALK PHOS: 51 U/L (ref 39–117)
ALT: 13 U/L (ref 0–53)
AST: 20 U/L (ref 0–37)
BILIRUBIN TOTAL: 0.7 mg/dL (ref 0.3–1.2)
BUN: 12 mg/dL (ref 6–23)
CO2: 32 mEq/L (ref 19–32)
Calcium: 8.5 mg/dL (ref 8.4–10.5)
Chloride: 88 mEq/L — ABNORMAL LOW (ref 96–112)
Creatinine, Ser: 0.7 mg/dL (ref 0.4–1.5)
GFR: 119.94 mL/min (ref >60.00)
GLUCOSE: 97 mg/dL (ref 70–99)
Potassium: 3.9 mEq/L (ref 3.5–5.1)
SODIUM: 128 meq/L — AB (ref 135–145)
Total Protein: 6.5 g/dL (ref 6.0–8.3)

## 2013-02-07 NOTE — Progress Notes (Signed)
Pre-visit discussion using our clinic review tool. No additional management support is needed unless otherwise documented below in the visit note.  

## 2013-02-07 NOTE — Assessment & Plan Note (Signed)
Followed by Dr. Grayland Ormond. Continue Hydroxyurea. Will request notes on recent labs.

## 2013-02-07 NOTE — Assessment & Plan Note (Signed)
Symptoms currently stable. Encouraged him to continue with PT exercises.

## 2013-02-07 NOTE — Progress Notes (Signed)
Subjective:    Patient ID: Johnny Sanford, male    DOB: 1940/06/23, 73 y.o.   MRN: [id number]  HPI 73YO male with adrenal insufficiency, labile hypertension, essential thrombocythemia presents for follow up.  Treated by ENT with prednisone for hearing loss. Completed course today. Has follow up with ENT. Pt reports irritability, nervousness while on prednisone.  Aside from this, things relatively stable. Continues on Hydroxyurea for ET. Tolerating well. Last Plt reportedly 420.  Compliant with meds. No recent headache, chest pain.   Outpatient Encounter Prescriptions as of 02/07/2013  Medication Sig  . ALPRAZolam (XANAX) 0.5 MG tablet Take 1 tablet (0.5 mg total) by mouth 3 (three) times daily as needed for sleep or anxiety.  Marland Kitchen aspirin 81 MG EC tablet Take 162 mg by mouth daily.   . budesonide-formoterol (SYMBICORT) 160-4.5 MCG/ACT inhaler Inhale 2 puffs into the lungs 2 (two) times daily.  . Calcium Carb-Cholecalciferol (CALCIUM 1000 + D PO) Take 1 tablet by mouth daily.    . Cyanocobalamin (VITAMIN B-12 PO) Take by mouth daily.  . cyclobenzaprine (FLEXERIL) 5 MG tablet Take 5 mg by mouth 3 (three) times daily as needed for muscle spasms.  . Ferrous Sulfate (IRON) 325 (65 FE) MG TABS Take by mouth daily.  . fludrocortisone (FLORINEF) 0.1 MG tablet Take 0.5 tablets (0.05 mg total) by mouth daily.  . fluorouracil (EFUDEX) 5 % cream Apply topically as needed.  . fluticasone (FLONASE) 50 MCG/ACT nasal spray Place 1 spray into the nose daily as needed for rhinitis.  . hydrALAZINE (APRESOLINE) 25 MG tablet Take 1 tablet (25 mg total) by mouth 3 (three) times daily. Use as needed for BP >160/100  . hydroxyurea (HYDREA) 500 MG capsule Take 5 capsules daily. May take with food to minimize GI side effects.  . isosorbide dinitrate (ISORDIL) 20 MG tablet Take 1 tablet (20 mg total) by mouth 3 (three) times daily as needed.  Marland Kitchen levothyroxine (SYNTHROID, LEVOTHROID) 137 MCG tablet Take 1 tablet (137  mcg total) by mouth daily.  . mometasone (ELOCON) 0.1 % lotion   . neomycin-polymyxin-hydrocortisone (CORTISPORIN) 3.5-10000-1 otic suspension   . OMEGA-3 1000 MG CAPS Take 1 capsule by mouth daily.    Marland Kitchen omeprazole (PRILOSEC) 20 MG capsule Take 1 capsule (20 mg total) by mouth daily.  . simvastatin (ZOCOR) 20 MG tablet Take 1 tablet (20 mg total) by mouth daily.  . traZODone (DESYREL) 50 MG tablet Take 0.5-1 tablets (25-50 mg total) by mouth at bedtime as needed for sleep.  . BP 110/90  Pulse 77  Temp(Src) 98.2 F (36.8 C) (Oral)  Wt 172 lb (78.019 kg)   Review of Systems  Constitutional: Positive for fatigue. Negative for fever, chills, activity change, appetite change and unexpected weight change.  HENT: Positive for hearing loss. Negative for ear pain.   Eyes: Negative for visual disturbance.  Respiratory: Negative for cough and shortness of breath.   Cardiovascular: Negative for chest pain, palpitations and leg swelling.  Gastrointestinal: Negative for abdominal pain and abdominal distention.  Genitourinary: Negative for dysuria, urgency and difficulty urinating.  Musculoskeletal: Negative for arthralgias and gait problem.  Skin: Negative for color change and rash.  Hematological: Negative for adenopathy.  Psychiatric/Behavioral: Negative for sleep disturbance and dysphoric mood. The patient is not nervous/anxious.        Objective:   Physical Exam  Constitutional: He is oriented to person, place, and time. He appears well-developed and well-nourished. No distress.  HENT:  Head: Normocephalic and atraumatic.  Right  Ear: Tympanic membrane and external ear normal.  Left Ear: External ear normal. A foreign body (powder obstructing view of TM) is present.  Nose: Nose normal.  Mouth/Throat: Oropharynx is clear and moist. No oropharyngeal exudate.  Eyes: Conjunctivae and EOM are normal. Pupils are equal, round, and reactive to light. Right eye exhibits no discharge. Left eye  exhibits no discharge. No scleral icterus.  Neck: Normal range of motion. Neck supple. No tracheal deviation present. No thyromegaly present.  Cardiovascular: Normal rate, regular rhythm and normal heart sounds.  Exam reveals no gallop and no friction rub.   No murmur heard. Pulmonary/Chest: Effort normal and breath sounds normal. No respiratory distress. He has no wheezes. He has no rales. He exhibits no tenderness.  Musculoskeletal: Normal range of motion. He exhibits no edema.  Lymphadenopathy:    He has no cervical adenopathy.  Neurological: He is alert and oriented to person, place, and time. No cranial nerve deficit. Coordination normal.  Skin: Skin is warm and dry. No rash noted. He is not diaphoretic. No erythema. No pallor.  Psychiatric: He has a normal mood and affect. His behavior is normal. Judgment and thought content normal.          Assessment & Plan:

## 2013-02-07 NOTE — Assessment & Plan Note (Signed)
BP Readings from Last 3 Encounters:  02/07/13 110/90  12/20/12 130/86  11/24/12 142/100   BP recently relatively stable. Will continue to monitor.

## 2013-02-07 NOTE — Assessment & Plan Note (Signed)
Symptomatically, doing well. Will check electrolytes with labs today.

## 2013-02-07 NOTE — Assessment & Plan Note (Signed)
Secondary to adrenal insufficiency. Will check electrolytes with labs today. 

## 2013-02-09 ENCOUNTER — Encounter: Payer: Self-pay | Admitting: Internal Medicine

## 2013-02-13 ENCOUNTER — Emergency Department: Payer: Self-pay | Admitting: Emergency Medicine

## 2013-02-13 LAB — BASIC METABOLIC PANEL
ANION GAP: 3 — AB (ref 7–16)
BUN: 9 mg/dL (ref 7–18)
CHLORIDE: 92 mmol/L — AB (ref 98–107)
CREATININE: 0.69 mg/dL (ref 0.60–1.30)
Calcium, Total: 8.1 mg/dL — ABNORMAL LOW (ref 8.5–10.1)
Co2: 31 mmol/L (ref 21–32)
EGFR (African American): >60
GLUCOSE: 91 mg/dL (ref 65–99)
Osmolality: 252 (ref 275–301)
POTASSIUM: 3.4 mmol/L — AB (ref 3.5–5.1)
Sodium: 126 mmol/L — ABNORMAL LOW (ref 136–145)

## 2013-02-13 LAB — CBC
HCT: 26.1 % — AB (ref 40.0–52.0)
HGB: 9.2 g/dL — ABNORMAL LOW (ref 13.0–18.0)
MCH: 46.6 pg — ABNORMAL HIGH (ref 26.0–34.0)
MCHC: 35.3 g/dL (ref 32.0–36.0)
MCV: 132 fL — AB (ref 80–100)
Platelet: 313 10*3/uL (ref 150–440)
RBC: 1.98 10*6/uL — ABNORMAL LOW (ref 4.40–5.90)
RDW: 15.1 % — ABNORMAL HIGH (ref 11.5–14.5)
WBC: 4.5 10*3/uL (ref 3.8–10.6)

## 2013-02-13 LAB — RAPID INFLUENZA A&B ANTIGENS

## 2013-02-14 ENCOUNTER — Encounter: Payer: Self-pay | Admitting: Internal Medicine

## 2013-02-16 LAB — CBC CANCER CENTER
Basophil #: 0 x10 3/mm (ref 0.0–0.1)
Basophil %: 0.5 %
EOS ABS: 0 x10 3/mm (ref 0.0–0.7)
Eosinophil %: 0.5 %
HCT: 25.3 % — AB (ref 40.0–52.0)
HGB: 8.9 g/dL — ABNORMAL LOW (ref 13.0–18.0)
Lymphocyte #: 0.6 x10 3/mm — ABNORMAL LOW (ref 1.0–3.6)
Lymphocyte %: 22.4 %
MCH: 46.8 pg — ABNORMAL HIGH (ref 26.0–34.0)
MCHC: 35.2 g/dL (ref 32.0–36.0)
MCV: 133 fL — ABNORMAL HIGH (ref 80–100)
Monocyte #: 0.3 x10 3/mm (ref 0.2–1.0)
Monocyte %: 10.7 %
NEUTROS PCT: 65.9 %
Neutrophil #: 1.8 x10 3/mm (ref 1.4–6.5)
Platelet: 304 x10 3/mm (ref 150–440)
RBC: 1.9 10*6/uL — ABNORMAL LOW (ref 4.40–5.90)
RDW: 14.5 % (ref 11.5–14.5)
WBC: 2.7 x10 3/mm — ABNORMAL LOW (ref 3.8–10.6)

## 2013-02-18 ENCOUNTER — Ambulatory Visit (INDEPENDENT_AMBULATORY_CARE_PROVIDER_SITE_OTHER): Payer: Medicare Other | Admitting: Internal Medicine

## 2013-02-18 ENCOUNTER — Encounter: Payer: Self-pay | Admitting: Internal Medicine

## 2013-02-18 VITALS — BP 150/100 | HR 82 | Temp 98.3°F | Wt 172.0 lb

## 2013-02-18 DIAGNOSIS — E2740 Unspecified adrenocortical insufficiency: Secondary | ICD-10-CM

## 2013-02-18 DIAGNOSIS — G4700 Insomnia, unspecified: Secondary | ICD-10-CM

## 2013-02-18 DIAGNOSIS — B9789 Other viral agents as the cause of diseases classified elsewhere: Principal | ICD-10-CM

## 2013-02-18 DIAGNOSIS — G8929 Other chronic pain: Secondary | ICD-10-CM

## 2013-02-18 DIAGNOSIS — E2749 Other adrenocortical insufficiency: Secondary | ICD-10-CM

## 2013-02-18 DIAGNOSIS — Z23 Encounter for immunization: Secondary | ICD-10-CM

## 2013-02-18 DIAGNOSIS — M542 Cervicalgia: Secondary | ICD-10-CM

## 2013-02-18 DIAGNOSIS — J069 Acute upper respiratory infection, unspecified: Secondary | ICD-10-CM

## 2013-02-18 MED ORDER — TRAZODONE HCL 50 MG PO TABS: 25.0000 mg | ORAL_TABLET | Freq: Every evening | ORAL | Status: DC | PRN

## 2013-02-18 MED ORDER — SIMVASTATIN 20 MG PO TABS: 20.0000 mg | ORAL_TABLET | Freq: Every day | ORAL | Status: DC

## 2013-02-18 MED ORDER — OMEPRAZOLE 20 MG PO CPDR: 20.0000 mg | DELAYED_RELEASE_CAPSULE | Freq: Every day | ORAL | Status: DC

## 2013-02-18 MED ORDER — LEVOTHYROXINE SODIUM 137 MCG PO TABS: 137.0000 ug | ORAL_TABLET | Freq: Every day | ORAL | Status: DC

## 2013-02-18 MED ORDER — TRAMADOL HCL 50 MG PO TABS: 50.0000 mg | ORAL_TABLET | Freq: Three times a day (TID) | ORAL | Status: DC | PRN

## 2013-02-18 NOTE — Progress Notes (Signed)
Subjective:    Patient ID: Johnny Sanford, male    DOB: 06-15-1940, 73 y.o.   MRN: [id number]  HPI  73YO male with labile HTN, adrenal insufficiency presents for follow up after recent ED visit. Sunday had fever, chills, went to urgent care. BP 70s/30, sent by EMS to [facility]. In ED, tested for flu which was negative. CXR was normal. Blood work showed Na was 126. Discussed with nephrologist who recommended adding NaCl tablet. Discharged home. No recurrent fever, chills. Feeling well. Occasionally feels anxious for no reason.  Has been taking alprazolam with some improvement. Also notes worsening symptoms of chronic neck pain. He has been taking his wife's tramadol with some improvement.  Outpatient Encounter Prescriptions as of 02/18/2013  Medication Sig  . ALPRAZolam (XANAX) 0.5 MG tablet Take 1 tablet (0.5 mg total) by mouth 3 (three) times daily as needed for sleep or anxiety.  Marland Kitchen aspirin 81 MG EC tablet Take 162 mg by mouth daily.   . budesonide-formoterol (SYMBICORT) 160-4.5 MCG/ACT inhaler Inhale 2 puffs into the lungs 2 (two) times daily.  . Calcium Carb-Cholecalciferol (CALCIUM 1000 + D PO) Take 1 tablet by mouth daily.    . Cyanocobalamin (VITAMIN B-12 PO) Take by mouth daily.  . cyclobenzaprine (FLEXERIL) 5 MG tablet Take 5 mg by mouth 3 (three) times daily as needed for muscle spasms.  . Ferrous Sulfate (IRON) 325 (65 FE) MG TABS Take by mouth daily.  . fludrocortisone (FLORINEF) 0.1 MG tablet Take 0.5 tablets (0.05 mg total) by mouth daily.  . fluorouracil (EFUDEX) 5 % cream Apply topically as needed.  . fluticasone (FLONASE) 50 MCG/ACT nasal spray Place 1 spray into the nose daily as needed for rhinitis.  . hydrALAZINE (APRESOLINE) 25 MG tablet Take 1 tablet (25 mg total) by mouth 3 (three) times daily. Use as needed for BP >160/100  . hydroxyurea (HYDREA) 500 MG capsule Take 5 capsules daily. May take with food to minimize GI side effects.  . isosorbide dinitrate (ISORDIL) 20 MG  tablet Take 1 tablet (20 mg total) by mouth 3 (three) times daily as needed.  Marland Kitchen levothyroxine (SYNTHROID, LEVOTHROID) 137 MCG tablet Take 1 tablet (137 mcg total) by mouth daily.  . mometasone (ELOCON) 0.1 % lotion   . OMEGA-3 1000 MG CAPS Take 1 capsule by mouth daily.    Marland Kitchen omeprazole (PRILOSEC) 20 MG capsule Take 1 capsule (20 mg total) by mouth daily.  . simvastatin (ZOCOR) 20 MG tablet Take 1 tablet (20 mg total) by mouth daily.  . traZODone (DESYREL) 50 MG tablet Take 0.5-1 tablets (25-50 mg total) by mouth at bedtime as needed for sleep.     Review of Systems  Constitutional: Positive for fatigue. Negative for fever, chills, activity change, appetite change and unexpected weight change.  Eyes: Negative for visual disturbance.  Respiratory: Positive for cough. Negative for shortness of breath.   Cardiovascular: Negative for chest pain, palpitations and leg swelling.  Gastrointestinal: Negative for abdominal pain and abdominal distention.  Genitourinary: Negative for dysuria, urgency and difficulty urinating.  Musculoskeletal: Positive for arthralgias, myalgias and neck pain. Negative for gait problem.  Skin: Negative for color change and rash.  Hematological: Negative for adenopathy.  Psychiatric/Behavioral: Positive for sleep disturbance. Negative for dysphoric mood. The patient is not nervous/anxious.        Objective:   Physical Exam  Constitutional: He is oriented to person, place, and time. He appears well-developed and well-nourished. No distress.  HENT:  Head: Normocephalic and atraumatic.  Right Ear: External ear normal.  Left Ear: External ear normal.  Nose: Nose normal.  Mouth/Throat: Oropharynx is clear and moist. No oropharyngeal exudate.  Eyes: Conjunctivae and EOM are normal. Pupils are equal, round, and reactive to light. Right eye exhibits no discharge. Left eye exhibits no discharge. No scleral icterus.  Neck: Normal range of motion. Neck supple. No tracheal  deviation present. No thyromegaly present.  Cardiovascular: Normal rate, regular rhythm and normal heart sounds.  Exam reveals no gallop and no friction rub.   No murmur heard. Pulmonary/Chest: Effort normal. No accessory muscle usage. Not tachypneic. No respiratory distress. He has no decreased breath sounds. He has no wheezes. He has rhonchi (few scattered). He has no rales. He exhibits no tenderness.  Musculoskeletal: He exhibits no edema.       Cervical back: He exhibits decreased range of motion, deformity (kyphosis) and pain.  Lymphadenopathy:    He has no cervical adenopathy.  Neurological: He is alert and oriented to person, place, and time. No cranial nerve deficit. Coordination normal.  Skin: Skin is warm and dry. No rash noted. He is not diaphoretic. No erythema. No pallor.  Psychiatric: He has a normal mood and affect. His behavior is normal. Judgment and thought content normal.          Assessment & Plan:

## 2013-02-18 NOTE — Progress Notes (Signed)
Pre-visit discussion using our clinic review tool. No additional management support is needed unless otherwise documented below in the visit note.  

## 2013-02-19 DIAGNOSIS — J069 Acute upper respiratory infection, unspecified: Secondary | ICD-10-CM | POA: Insufficient documentation

## 2013-02-19 DIAGNOSIS — B9789 Other viral agents as the cause of diseases classified elsewhere: Principal | ICD-10-CM

## 2013-02-19 NOTE — Assessment & Plan Note (Signed)
Recently diagnosed with Viral URI with cough. Symptoms improving. Few scattered rhonchi heard on exam today. Will continue to monitor. He will call if any recurrent fever, productive cough. Follow up prn.

## 2013-02-19 NOTE — Assessment & Plan Note (Signed)
Continue Trazodone 

## 2013-02-19 NOTE — Assessment & Plan Note (Signed)
Symptoms of chronic neck pain persistent. Secondary to DJD and kyphosis. Continues with home PT exercises. Will start Tramadol to see if any improvement.

## 2013-02-19 NOTE — Assessment & Plan Note (Signed)
BP continues to fluctuate. Will check renal function with labs today with his nephrologist.

## 2013-02-22 NOTE — Addendum Note (Signed)
Addended by: Ronaldo Miyamoto on: 02/22/2013 08:37 AM   Modules accepted: Orders

## 2013-02-27 ENCOUNTER — Ambulatory Visit: Payer: Self-pay | Admitting: Oncology

## 2013-03-02 ENCOUNTER — Telehealth: Payer: Self-pay | Admitting: Internal Medicine

## 2013-03-02 NOTE — Telephone Encounter (Signed)
Relevant patient education assigned to patient using Emmi. ° °

## 2013-03-09 ENCOUNTER — Ambulatory Visit (INDEPENDENT_AMBULATORY_CARE_PROVIDER_SITE_OTHER): Payer: Medicare Other | Admitting: Cardiovascular Disease

## 2013-03-09 ENCOUNTER — Encounter: Payer: Self-pay | Admitting: Cardiovascular Disease

## 2013-03-09 VITALS — BP 126/88 | HR 73 | Ht 65.0 in | Wt 175.5 lb

## 2013-03-09 DIAGNOSIS — E785 Hyperlipidemia, unspecified: Secondary | ICD-10-CM

## 2013-03-09 DIAGNOSIS — R0989 Other specified symptoms and signs involving the circulatory and respiratory systems: Secondary | ICD-10-CM

## 2013-03-09 DIAGNOSIS — I10 Essential (primary) hypertension: Secondary | ICD-10-CM

## 2013-03-09 DIAGNOSIS — E871 Hypo-osmolality and hyponatremia: Secondary | ICD-10-CM

## 2013-03-09 DIAGNOSIS — I6529 Occlusion and stenosis of unspecified carotid artery: Secondary | ICD-10-CM | POA: Insufficient documentation

## 2013-03-09 MED ORDER — MIDODRINE HCL 10 MG PO TABS
10.0000 mg | ORAL_TABLET | Freq: Three times a day (TID) | ORAL | Status: DC | PRN
Start: 2013-03-09 — End: 2013-05-10

## 2013-03-09 NOTE — Assessment & Plan Note (Signed)
We'll discuss with him whether repeat carotid ultrasound is indicated. Continue aggressive cholesterol management

## 2013-03-09 NOTE — Assessment & Plan Note (Signed)
Reports recent rapid drops in his blood pressure down to 80 systolic but did not improve. Associated with chills fever, he took hydralazine for severe hypertension. Etiology is not clear as there is no sign of ear infection. Suggested he take Tylenol for chills and fever and cough [place] if symptoms recur. We have given him midodrine and recommended he take 5 mg if blood pressure does not improve and he is symptomatic

## 2013-03-09 NOTE — Patient Instructions (Addendum)
You are doing well. For low blood pressure, Take a 1/2 midodrine as needed   Take tylenol for fever and chills  Please call us if you have new issues that need to be addressed before your next appt.  Your physician wants you to follow-up in: 6 months.  You will receive a reminder letter in the mail two months in advance. If you don't receive a letter, please call our office to schedule the follow-up appointment.

## 2013-03-09 NOTE — Progress Notes (Signed)
Patient ID: Johnny Sanford, male    DOB: Jun 19, 1940, 73 y.o.   MRN: [id number]  HPI Comments: 73 year old gentleman, patient of Dr. Ronette Deter, with history of GERD, CVA, carotid arterial disease with  carotid endarterectomy on the left in 2003 with 60% disease on the right, hypothyroidism, very labile blood pressures, Started on Florinef by a [facility] physician, history of throat cancer with radiation , who presents for routine followup Admission to the hospital April 2014 for hyponatremia, severe hypertension that resolved on its own after initially using blood pressure medications   In followup today, he reports that he has had several episodes of chills and fever, 1 this past week, 13 weeks ago. Associated with this he had very elevated blood pressures, followed by rapid drops in his blood pressure after he took hydralazine. Recent problems with sinusitis in December 2014. Was given prednisone and antibiotics. He reports having chronic cough, no significant increase in sputum production. Denies having viral syndrome. No symptoms of upper respiratory infection. He is losing his hearing on the left, worried about fluid in his ear. He is followed by ear nose throat  He reports that the Florinef seems to be working and rarely has extremely low blood pressures with symptoms.  He was admitted to the hospital 01/12/2012 with confusion, slurred speech, rule out for CVA, sodium was 126   Carotid ultrasound showed no significant disease on the left, 60% blockage on the right Interestingly carotid ultrasound December 2013 at [facility] showed atherosclerotic disease with no significant stenosis He was told that he has spinal stenosis  Total cholesterol 137, LDL 62, HDL 60 on December 2013  EKG shows normal sinus rhythm with rate 73 beats per minute with no significant ST or T wave changes     Outpatient Encounter Prescriptions as of 03/09/2013  Medication Sig  . ALPRAZolam (XANAX) 0.5 MG tablet Take 1  tablet (0.5 mg total) by mouth 3 (three) times daily as needed for sleep or anxiety.  Marland Kitchen aspirin 81 MG EC tablet Take 162 mg by mouth daily.   . budesonide-formoterol (SYMBICORT) 160-4.5 MCG/ACT inhaler Inhale 2 puffs into the lungs 2 (two) times daily.  . Calcium Carb-Cholecalciferol (CALCIUM 1000 + D PO) Take 1 tablet by mouth daily.    . Cyanocobalamin (VITAMIN B-12 PO) Take by mouth daily.  . cyclobenzaprine (FLEXERIL) 5 MG tablet Take 5 mg by mouth 3 (three) times daily as needed for muscle spasms.  . Ferrous Sulfate (IRON) 325 (65 FE) MG TABS Take by mouth daily.  . fludrocortisone (FLORINEF) 0.1 MG tablet Take 0.5 tablets (0.05 mg total) by mouth daily.  . fluorouracil (EFUDEX) 5 % cream Apply topically as needed.  . fluticasone (FLONASE) 50 MCG/ACT nasal spray Place 1 spray into the nose daily as needed for rhinitis.  . hydrALAZINE (APRESOLINE) 25 MG tablet Take 1 tablet (25 mg total) by mouth 3 (three) times daily. Use as needed for BP >160/100  . hydroxyurea (HYDREA) 500 MG capsule Take 5 capsules daily. May take with food to minimize GI side effects.  . isosorbide dinitrate (ISORDIL) 20 MG tablet Take 1 tablet (20 mg total) by mouth 3 (three) times daily as needed.  Marland Kitchen levothyroxine (SYNTHROID, LEVOTHROID) 137 MCG tablet Take 1 tablet (137 mcg total) by mouth daily.  . mometasone (ELOCON) 0.1 % lotion as needed.   . OMEGA-3 1000 MG CAPS Take 1 capsule by mouth daily.    Marland Kitchen omeprazole (PRILOSEC) 20 MG capsule Take 1 capsule (20 mg total)  by mouth daily.  . simvastatin (ZOCOR) 20 MG tablet Take 1 tablet (20 mg total) by mouth daily.  . traMADol (ULTRAM) 50 MG tablet Take 1 tablet (50 mg total) by mouth every 8 (eight) hours as needed.  . traZODone (DESYREL) 50 MG tablet Take 0.5-1 tablets (25-50 mg total) by mouth at bedtime as needed for sleep.  . midodrine (PROAMATINE) 10 MG tablet Take 1 tablet (10 mg total) by mouth 3 (three) times daily as needed.     Review of Systems   Constitutional: Negative.        Labile blood pressure  HENT: Negative.   Eyes: Negative.   Respiratory: Negative.   Cardiovascular: Negative.   Gastrointestinal: Negative.   Endocrine: Negative.   Musculoskeletal: Negative.   Skin: Negative.   Allergic/Immunologic: Negative.   Neurological: Negative.   Hematological: Negative.   Psychiatric/Behavioral: Negative.   All other systems reviewed and are negative.    BP 126/88  Pulse 73  Ht 5\' 5"  (1.651 m)  Wt 175 lb 8 oz (79.606 kg)  BMI 29.20 kg/m2  Physical Exam  Nursing note and vitals reviewed. Constitutional: He is oriented to person, place, and time. He appears well-developed and well-nourished.  HENT:  Head: Normocephalic.  Nose: Nose normal.  Mouth/Throat: Oropharynx is clear and moist.  Eyes: Conjunctivae are normal. Pupils are equal, round, and reactive to light.  Neck: Normal range of motion. Neck supple. No JVD present.  Cardiovascular: Normal rate, regular rhythm, S1 normal, S2 normal and intact distal pulses.  Exam reveals no gallop and no friction rub.   Murmur heard.  Crescendo systolic murmur is present with a grade of 1/6  Pulmonary/Chest: Effort normal and breath sounds normal. No respiratory distress. He has no wheezes. He has no rales. He exhibits no tenderness.  Abdominal: Soft. Bowel sounds are normal. He exhibits no distension. There is no tenderness.  Musculoskeletal: Normal range of motion. He exhibits no edema and no tenderness.  Lymphadenopathy:    He has no cervical adenopathy.  Neurological: He is alert and oriented to person, place, and time. Coordination normal.  Skin: Skin is warm and dry. No rash noted. No erythema.  Psychiatric: He has a normal mood and affect. His behavior is normal. Judgment and thought content normal.      Assessment and Plan

## 2013-03-09 NOTE — Assessment & Plan Note (Signed)
Recommended that he stay on his simvastatin 

## 2013-03-09 NOTE — Assessment & Plan Note (Signed)
Sodium level chronically depressed. This has been stable for quite some time

## 2013-03-29 ENCOUNTER — Ambulatory Visit: Payer: Medicare Other | Admitting: Internal Medicine

## 2013-03-30 ENCOUNTER — Ambulatory Visit: Payer: Self-pay | Admitting: Oncology

## 2013-03-30 LAB — CBC CANCER CENTER
BASOS ABS: 0 x10 3/mm (ref 0.0–0.1)
BASOS PCT: 0.5 %
Eosinophil #: 0 x10 3/mm (ref 0.0–0.7)
Eosinophil %: 0.4 %
HCT: 30.7 % — ABNORMAL LOW (ref 40.0–52.0)
HGB: 10.6 g/dL — AB (ref 13.0–18.0)
LYMPHS ABS: 0.6 x10 3/mm — AB (ref 1.0–3.6)
Lymphocyte %: 22.1 %
MCH: 46.3 pg — ABNORMAL HIGH (ref 26.0–34.0)
MCHC: 34.5 g/dL (ref 32.0–36.0)
MCV: 134 fL — AB (ref 80–100)
MONO ABS: 0.4 x10 3/mm (ref 0.2–1.0)
Monocyte %: 16.4 %
Neutrophil #: 1.7 x10 3/mm (ref 1.4–6.5)
Neutrophil %: 60.6 %
PLATELETS: 576 x10 3/mm — AB (ref 150–440)
RBC: 2.29 10*6/uL — AB (ref 4.40–5.90)
RDW: 13.8 % (ref 11.5–14.5)
WBC: 2.7 x10 3/mm — AB (ref 3.8–10.6)

## 2013-04-27 ENCOUNTER — Ambulatory Visit: Payer: Self-pay | Admitting: Oncology

## 2013-04-28 ENCOUNTER — Encounter: Payer: Self-pay | Admitting: *Deleted

## 2013-04-29 ENCOUNTER — Telehealth: Payer: Self-pay | Admitting: Cardiology

## 2013-04-29 NOTE — Telephone Encounter (Signed)
Pt of Dr. Candis Musa who called reporting that his BP was elevated around 7:30 am at 190/112. He took one 25 mg hydralazine tablet and his BP dropped in the low 03O systolic. He felt a bit dizzy, but denied syncope/ near syncope. No chest pain or SOB. He rechecked his BP around 5pm and it was 93/61. His wife gave him a salt tablet and he called for further recommendations. During phone encounter, his symptoms had improved. He rechecked his BP while I was on the phone with him and it had improved to 133/88. I instructed him to stay well hydrated with fluids and to recheck is BP later this evening. No further hydralazine tonight as long as BP remains controlled. He was instructed to call our office back if he develops any recurrent symptoms and if SBP falls below 90 mmHg. He verbalized understanding.   Lyda Jester, PA-C

## 2013-05-09 ENCOUNTER — Ambulatory Visit: Payer: Medicare Other | Admitting: Internal Medicine

## 2013-05-10 ENCOUNTER — Ambulatory Visit (INDEPENDENT_AMBULATORY_CARE_PROVIDER_SITE_OTHER)
Admission: RE | Admit: 2013-05-10 | Discharge: 2013-05-10 | Disposition: A | Discharge status: Home / Self Care | Payer: Medicare Other | Source: Ambulatory Visit | Attending: Internal Medicine | Admitting: Internal Medicine

## 2013-05-10 ENCOUNTER — Encounter: Payer: Self-pay | Admitting: Internal Medicine

## 2013-05-10 ENCOUNTER — Ambulatory Visit (INDEPENDENT_AMBULATORY_CARE_PROVIDER_SITE_OTHER): Payer: Medicare Other | Admitting: Internal Medicine

## 2013-05-10 VITALS — BP 144/100 | HR 75 | Temp 97.8°F | Wt 175.0 lb

## 2013-05-10 DIAGNOSIS — R5381 Other malaise: Secondary | ICD-10-CM

## 2013-05-10 DIAGNOSIS — R531 Weakness: Secondary | ICD-10-CM | POA: Insufficient documentation

## 2013-05-10 DIAGNOSIS — E2749 Other adrenocortical insufficiency: Secondary | ICD-10-CM

## 2013-05-10 DIAGNOSIS — J209 Acute bronchitis, unspecified: Secondary | ICD-10-CM

## 2013-05-10 DIAGNOSIS — R5383 Other fatigue: Secondary | ICD-10-CM

## 2013-05-10 DIAGNOSIS — I10 Essential (primary) hypertension: Secondary | ICD-10-CM

## 2013-05-10 DIAGNOSIS — E2740 Unspecified adrenocortical insufficiency: Secondary | ICD-10-CM

## 2013-05-10 DIAGNOSIS — R0989 Other specified symptoms and signs involving the circulatory and respiratory systems: Secondary | ICD-10-CM

## 2013-05-10 DIAGNOSIS — F411 Generalized anxiety disorder: Secondary | ICD-10-CM

## 2013-05-10 LAB — CBC WITH DIFFERENTIAL/PLATELET
BASOS PCT: 0.3 % (ref 0.0–3.0)
Basophils Absolute: 0 10*3/uL (ref 0.0–0.1)
EOS ABS: 0 10*3/uL (ref 0.0–0.7)
Eosinophils Relative: 0.4 % (ref 0.0–5.0)
HEMATOCRIT: 27.6 % — AB (ref 39.0–52.0)
HEMOGLOBIN: 9.3 g/dL — AB (ref 13.0–17.0)
LYMPHS PCT: 31 % (ref 12.0–46.0)
Lymphs Abs: 0.7 10*3/uL (ref 0.7–4.0)
MCHC: 33.8 g/dL (ref 30.0–36.0)
MCV: 134.8 fl — ABNORMAL HIGH (ref 78.0–100.0)
Monocytes Absolute: 0.3 10*3/uL (ref 0.1–1.0)
Monocytes Relative: 15.2 % — ABNORMAL HIGH (ref 3.0–12.0)
NEUTROS ABS: 1.1 10*3/uL — AB (ref 1.4–7.7)
Neutrophils Relative %: 53.1 % (ref 43.0–77.0)
Platelets: 365 10*3/uL (ref 150.0–400.0)
RDW: 16.5 % — ABNORMAL HIGH (ref 11.5–14.6)
WBC: 2.2 10*3/uL — ABNORMAL LOW (ref 4.5–10.5)

## 2013-05-10 LAB — T4, FREE: Free T4: 1.17 ng/dL (ref 0.60–1.60)

## 2013-05-10 LAB — COMPREHENSIVE METABOLIC PANEL
ALT: 18 U/L (ref 0–53)
AST: 27 U/L (ref 0–37)
Albumin: 3.6 g/dL (ref 3.5–5.2)
Alkaline Phosphatase: 47 U/L (ref 39–117)
BUN: 9 mg/dL (ref 6–23)
CALCIUM: 8.5 mg/dL (ref 8.4–10.5)
CHLORIDE: 90 meq/L — AB (ref 96–112)
CO2: 30 mEq/L (ref 19–32)
Creatinine, Ser: 0.5 mg/dL (ref 0.4–1.5)
GFR: 169.89 mL/min (ref >60.00)
Glucose, Bld: 97 mg/dL (ref 70–99)
Potassium: 3.9 mEq/L (ref 3.5–5.1)
Sodium: 129 mEq/L — ABNORMAL LOW (ref 135–145)
Total Bilirubin: 0.4 mg/dL (ref 0.3–1.2)
Total Protein: 6.9 g/dL (ref 6.0–8.3)

## 2013-05-10 LAB — VITAMIN B12: Vitamin B-12: >1500 pg/mL — ABNORMAL HIGH (ref 211–911)

## 2013-05-10 LAB — TSH: TSH: 0.92 u[IU]/mL (ref 0.35–5.50)

## 2013-05-10 MED ORDER — ESCITALOPRAM OXALATE 5 MG PO TABS: 5.0000 mg | ORAL_TABLET | Freq: Every day | ORAL | Status: DC

## 2013-05-10 MED ORDER — LEVOFLOXACIN 500 MG PO TABS: 500.0000 mg | ORAL_TABLET | Freq: Every day | ORAL | Status: DC

## 2013-05-10 NOTE — Assessment & Plan Note (Signed)
Generalized weakness noted by patient. Exam is unchanged and no focal deficits noted. Suspect that weakness related to multifactorial medical issues including adrenal insufficiency, COPD, peripheral vascular disease. Will check labs including CBC, CMP, B12, TSH.

## 2013-05-10 NOTE — Assessment & Plan Note (Signed)
Will recheck electrolytes with labs today. 

## 2013-05-10 NOTE — Progress Notes (Signed)
Pre visit review using our clinic review tool, if applicable. No additional management support is needed unless otherwise documented below in the visit note. 

## 2013-05-10 NOTE — Patient Instructions (Addendum)
We will check labs today.  Start Lexapro 5mg  daily to help with anxiety.  Please get Chest Xray today.  Start Levaquin for bronchitis.  Follow up 4 weeks.

## 2013-05-10 NOTE — Progress Notes (Signed)
Subjective:    Patient ID: Johnny Sanford, male    DOB: 1940-04-22, 73 y.o.   MRN: [id number]  HPI 73YO male presents for follow up.  Cough - 1-2 weeks. Worse in last week.Coughing up tan colored mucous. Occasionally feels short of breath. No fever. No chest pain. Not taking anything for this.  Anxiety - Feeling more anxious. No improvement with Alprazolam. Having angry outbursts.  Weakness - Feels generally weak. Recently evaluated by a chiropractor and was told that he had decreased sense of vibration.  Has trouble completing activities such as woodworking. No focal numbness or weakness noted. Only generalized symptoms.  Review of Systems  Constitutional: Positive for fatigue. Negative for fever, chills, activity change, appetite change and unexpected weight change.  Eyes: Negative for visual disturbance.  Respiratory: Positive for cough and shortness of breath. Negative for chest tightness and wheezing.   Cardiovascular: Negative for chest pain, palpitations and leg swelling.  Gastrointestinal: Negative for abdominal pain and abdominal distention.  Genitourinary: Negative for dysuria, urgency and difficulty urinating.  Musculoskeletal: Negative for arthralgias and gait problem.  Skin: Negative for color change and rash.  Neurological: Positive for weakness and light-headedness (intermittent with low BP). Negative for dizziness, tremors, seizures, syncope, speech difficulty, numbness and headaches.  Hematological: Negative for adenopathy.  Psychiatric/Behavioral: Positive for sleep disturbance. Negative for dysphoric mood. The patient is nervous/anxious.        Objective:    BP 144/100  Pulse 75  Temp(Src) 97.8 F (36.6 C) (Oral)  Wt 175 lb (79.379 kg)  SpO2 97% Physical Exam  Constitutional: He is oriented to person, place, and time. He appears well-developed and well-nourished. No distress.  HENT:  Head: Normocephalic and atraumatic.  Right Ear: External ear normal.  Left  Ear: External ear normal.  Nose: Nose normal.  Mouth/Throat: Oropharynx is clear and moist. No oropharyngeal exudate.  Eyes: Conjunctivae and EOM are normal. Pupils are equal, round, and reactive to light. Right eye exhibits no discharge. Left eye exhibits no discharge. No scleral icterus.  Neck: Normal range of motion. Neck supple. No tracheal deviation present. No thyromegaly present.  Cardiovascular: Normal rate, regular rhythm and normal heart sounds.  Exam reveals no gallop and no friction rub.   No murmur heard. Pulmonary/Chest: Effort normal. No accessory muscle usage. Not tachypneic. No respiratory distress. He has no decreased breath sounds. He has no wheezes. Rhonchi: scattered left>right lung. He has no rales. He exhibits no tenderness.  Musculoskeletal: Normal range of motion. He exhibits no edema.  Lymphadenopathy:    He has no cervical adenopathy.  Neurological: He is alert and oriented to person, place, and time. No cranial nerve deficit. Coordination normal.  Skin: Skin is warm and dry. No rash noted. He is not diaphoretic. No erythema. No pallor.  Psychiatric: He has a normal mood and affect. His behavior is normal. Judgment and thought content normal.          Assessment & Plan:   Problem List Items Addressed This Visit   Acute bronchitis     Symptoms are consistent with acute bronchitis. Chest x-ray today shows no focal infiltrate. Will start Levaquin 500 mg daily. Plan to followup in one to 2 weeks or sooner as needed.    Relevant Medications      levofloxacin (LEVAQUIN) tablet   Other Relevant Orders      DG Chest 2 View (Completed)   Adrenal insufficiency     Will recheck electrolytes with labs today.  Anxiety state, unspecified - Primary     Recent increased symptoms of anxiety. Will try adding Lexapro 5 mg daily to help better control symptoms. Followup in 2 weeks.    Relevant Medications      escitalopram (LEXAPRO) tablet   Labile hypertension      Continue prn hydralazine. Pt has not been taking Midodrine as directed by Dr. Rockey Situ. Will continue to monitor.    Weakness generalized     Generalized weakness noted by patient. Exam is unchanged and no focal deficits noted. Suspect that weakness related to multifactorial medical issues including adrenal insufficiency, COPD, peripheral vascular disease. Will check labs including CBC, CMP, B12, TSH.    Relevant Orders      B12      CBC w/Diff      Comprehensive metabolic panel      TSH      T4, free       Return in about 2 weeks (around 05/24/2013) for Recheck.

## 2013-05-10 NOTE — Assessment & Plan Note (Signed)
Recent increased symptoms of anxiety. Will try adding Lexapro 5 mg daily to help better control symptoms. Followup in 2 weeks.

## 2013-05-10 NOTE — Assessment & Plan Note (Signed)
Continue prn hydralazine. Pt has not been taking Midodrine as directed by Dr. Rockey Situ. Will continue to monitor.

## 2013-05-10 NOTE — Assessment & Plan Note (Signed)
Symptoms are consistent with acute bronchitis. Chest x-ray today shows no focal infiltrate. Will start Levaquin 500 mg daily. Plan to followup in one to 2 weeks or sooner as needed.

## 2013-05-12 LAB — CBC CANCER CENTER
BASOS ABS: 0 x10 3/mm (ref 0.0–0.1)
Basophil %: 0.7 %
Eosinophil #: 0 x10 3/mm (ref 0.0–0.7)
Eosinophil %: 0.5 %
HCT: 29.2 % — AB (ref 40.0–52.0)
HGB: 10.3 g/dL — ABNORMAL LOW (ref 13.0–18.0)
Lymphocyte #: 0.6 x10 3/mm — ABNORMAL LOW (ref 1.0–3.6)
Lymphocyte %: 34.5 %
MCH: 47.1 pg — ABNORMAL HIGH (ref 26.0–34.0)
MCHC: 35.2 g/dL (ref 32.0–36.0)
MCV: 134 fL — AB (ref 80–100)
MONO ABS: 0.3 x10 3/mm (ref 0.2–1.0)
MONOS PCT: 18.4 %
NEUTROS ABS: 0.9 x10 3/mm — AB (ref 1.4–6.5)
NEUTROS PCT: 45.9 %
Platelet: 371 x10 3/mm (ref 150–440)
RBC: 2.18 10*6/uL — ABNORMAL LOW (ref 4.40–5.90)
RDW: 14.1 % (ref 11.5–14.5)
WBC: 1.9 x10 3/mm — CL (ref 3.8–10.6)

## 2013-05-27 ENCOUNTER — Ambulatory Visit: Payer: Self-pay | Admitting: Oncology

## 2013-06-01 ENCOUNTER — Encounter: Payer: Self-pay | Admitting: Internal Medicine

## 2013-06-01 ENCOUNTER — Telehealth: Payer: Self-pay | Admitting: Internal Medicine

## 2013-06-01 ENCOUNTER — Ambulatory Visit (INDEPENDENT_AMBULATORY_CARE_PROVIDER_SITE_OTHER): Payer: Medicare Other | Admitting: Internal Medicine

## 2013-06-01 VITALS — BP 98/72 | HR 85 | Temp 98.2°F | Wt 167.0 lb

## 2013-06-01 DIAGNOSIS — R5383 Other fatigue: Secondary | ICD-10-CM

## 2013-06-01 DIAGNOSIS — Z136 Encounter for screening for cardiovascular disorders: Secondary | ICD-10-CM

## 2013-06-01 DIAGNOSIS — F411 Generalized anxiety disorder: Secondary | ICD-10-CM

## 2013-06-01 DIAGNOSIS — R531 Weakness: Secondary | ICD-10-CM

## 2013-06-01 DIAGNOSIS — J209 Acute bronchitis, unspecified: Secondary | ICD-10-CM

## 2013-06-01 DIAGNOSIS — R5381 Other malaise: Secondary | ICD-10-CM

## 2013-06-01 MED ORDER — CLONAZEPAM 0.5 MG PO TABS: 0.5000 mg | ORAL_TABLET | Freq: Two times a day (BID) | ORAL | Status: DC | PRN

## 2013-06-01 MED ORDER — ESCITALOPRAM OXALATE 10 MG PO TABS: 10.0000 mg | ORAL_TABLET | Freq: Every day | ORAL | Status: DC

## 2013-06-01 MED ORDER — LEVOFLOXACIN 500 MG PO TABS: 500.0000 mg | ORAL_TABLET | Freq: Every day | ORAL | Status: DC

## 2013-06-01 NOTE — Progress Notes (Signed)
Pre visit review using our clinic review tool, if applicable. No additional management support is needed unless otherwise documented below in the visit note. 

## 2013-06-01 NOTE — Patient Instructions (Addendum)
Increase Lexapro to 10mg  daily.  STOP Alprazolam.  Start Clonazepam 0.5mg  up to twice daily as needed for anxiety.  Start course of Levaquin daily x 7 days.  Continue with Symbicort and Ventolin.  Follow up 4 weeks or sooner as needed.

## 2013-06-01 NOTE — Assessment & Plan Note (Signed)
Symptoms improved but not completely resolved. Will repeat course of Levaquin. He is intolerant of Prednisone. Continue Symbicort and Albuterol.

## 2013-06-01 NOTE — Telephone Encounter (Signed)
Please fax a signed order for abdominal ultrasound to [facility] [phone]

## 2013-06-01 NOTE — Assessment & Plan Note (Signed)
Persistent anxiety despite Lexapro. Will increase dose of Lexapro. Will stop alprazolam and start Clonazepam. Follow up in 4 weeks and prn.

## 2013-06-01 NOTE — Assessment & Plan Note (Signed)
Likely multifactorial with ongoing health issues. Reviewed recent labs which were stable. Discussed side effects of medications including weakness and fatigue. Will continue to monitor for now.

## 2013-06-01 NOTE — Progress Notes (Signed)
Subjective:    Patient ID: Johnny Sanford, male    DOB: 10/17/1940, 73 y.o.   MRN: [id number]  HPI 73YO male with adrenal insufficiency, essential thrombocythemia presents for follow up.  He was last seen 4/14 and diagnosed with acute bronchitis and increased anxiety. He was started on Lexapro. Symptoms are improved, but not completely controlled. Continues to take alprazolam in addition to Lexapro, approx 1-2 times daily.  Bronchitis - Symptoms of cough improved slightly, but still occasionally having wet, productive cough.  No fever, chills, chest pain.  Seen by ENT. 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  Concerned about diffuse weakness. This has been ongoing for many months. No focal symptoms.   Review of Systems  Constitutional: Positive for fatigue. Negative for fever, chills, activity change, appetite change and unexpected weight change.  Eyes: Negative for visual disturbance.  Respiratory: Positive for cough. Negative for shortness of breath.   Cardiovascular: Negative for chest pain, palpitations and leg swelling.  Gastrointestinal: Negative for abdominal pain and abdominal distention.  Genitourinary: Negative for dysuria, urgency and difficulty urinating.  Musculoskeletal: Negative for arthralgias and gait problem.  Skin: Negative for color change and rash.  Neurological: Positive for weakness. Negative for dizziness, seizures, facial asymmetry, light-headedness, numbness and headaches.  Hematological: Negative for adenopathy.  Psychiatric/Behavioral: Negative for sleep disturbance and dysphoric mood. The patient is nervous/anxious.        Objective:    BP 98/72  Pulse 85  Temp(Src) 98.2 F (36.8 C) (Oral)  Wt 167 lb (75.751 kg)  SpO2 94% Physical Exam  Constitutional: He is oriented to person, place, and time. He appears well-developed and well-nourished. No distress.  HENT:  Head: Normocephalic and atraumatic.  Right Ear: External ear normal.  Left Ear: External  ear normal.  Nose: Nose normal.  Mouth/Throat: Oropharynx is clear and moist. No oropharyngeal exudate.  Eyes: Conjunctivae and EOM are normal. Pupils are equal, round, and reactive to light. Right eye exhibits no discharge. Left eye exhibits no discharge. No scleral icterus.  Neck: Normal range of motion. Neck supple. No tracheal deviation present. No thyromegaly present.  Cardiovascular: Normal rate, regular rhythm and normal heart sounds.  Exam reveals no gallop and no friction rub.   No murmur heard. Pulmonary/Chest: Effort normal. No accessory muscle usage. Not tachypneic. No respiratory distress. He has no decreased breath sounds. He has no wheezes. He has rhonchi in the left lower field. He has no rales. He exhibits no tenderness.  Musculoskeletal: Normal range of motion. He exhibits no edema.  Lymphadenopathy:    He has no cervical adenopathy.  Neurological: He is alert and oriented to person, place, and time. No cranial nerve deficit. Coordination normal.  Skin: Skin is warm and dry. No rash noted. He is not diaphoretic. No erythema. No pallor.  Psychiatric: His speech is normal and behavior is normal. Judgment and thought content normal. His mood appears anxious. Cognition and memory are normal.          Assessment & Plan:   Problem List Items Addressed This Visit   Acute bronchitis     Symptoms improved but not completely resolved. Will repeat course of Levaquin. He is intolerant of Prednisone. Continue Symbicort and Albuterol.    Relevant Medications      levofloxacin (LEVAQUIN) tablet   Anxiety state, unspecified - Primary     Persistent anxiety despite Lexapro. Will increase dose of Lexapro. Will stop alprazolam and start Clonazepam. Follow up in 4 weeks and prn.  Relevant Medications      escitalopram (LEXAPRO) tablet      clonazePAM (KLONOPIN)  tablet   Weakness generalized     Likely multifactorial with ongoing health issues. Reviewed recent labs which were  stable. Discussed side effects of medications including weakness and fatigue. Will continue to monitor for now.     Other Visit Diagnoses   Screening for abdominal aortic aneurysm        Relevant Orders       Ambulatory referral to Vascular Surgery        Return in about 4 weeks (around 06/29/2013) for Recheck.

## 2013-06-03 ENCOUNTER — Encounter: Payer: Self-pay | Admitting: Internal Medicine

## 2013-06-03 ENCOUNTER — Emergency Department: Payer: Self-pay | Admitting: Emergency Medicine

## 2013-06-03 LAB — URINALYSIS, COMPLETE
BACTERIA: NONE SEEN
Bilirubin,UR: NEGATIVE
Blood: NEGATIVE
Glucose,UR: NEGATIVE mg/dL (ref 0–75)
Leukocyte Esterase: NEGATIVE
NITRITE: NEGATIVE
Ph: 7 (ref 4.5–8.0)
Protein: NEGATIVE
RBC,UR: <1 /HPF (ref 0–5)
Specific Gravity: 1.016 (ref 1.003–1.030)
Squamous Epithelial: <1
WBC UR: <1 /HPF (ref 0–5)

## 2013-06-03 LAB — CBC WITH DIFFERENTIAL/PLATELET
BASOS ABS: 0 10*3/uL (ref 0.0–0.1)
BASOS PCT: 0.6 %
Eosinophil #: 0 10*3/uL (ref 0.0–0.7)
Eosinophil %: 0.7 %
HCT: 32.1 % — AB (ref 40.0–52.0)
HGB: 10.9 g/dL — ABNORMAL LOW (ref 13.0–18.0)
LYMPHS ABS: 0.7 10*3/uL — AB (ref 1.0–3.6)
Lymphocyte %: 29.1 %
MCH: 45.8 pg — ABNORMAL HIGH (ref 26.0–34.0)
MCHC: 34 g/dL (ref 32.0–36.0)
MCV: 135 fL — AB (ref 80–100)
Monocyte #: 0.3 x10 3/mm (ref 0.2–1.0)
Monocyte %: 12.3 %
NEUTROS ABS: 1.5 10*3/uL (ref 1.4–6.5)
NEUTROS PCT: 57.3 %
PLATELETS: 204 10*3/uL (ref 150–440)
RBC: 2.38 10*6/uL — ABNORMAL LOW (ref 4.40–5.90)
RDW: 14.4 % (ref 11.5–14.5)
WBC: 2.5 10*3/uL — ABNORMAL LOW (ref 3.8–10.6)

## 2013-06-03 LAB — PROTIME-INR
INR: 1
PROTHROMBIN TIME: 13.3 s (ref 11.5–14.7)

## 2013-06-03 LAB — TROPONIN I

## 2013-06-03 LAB — COMPREHENSIVE METABOLIC PANEL
ALK PHOS: 57 U/L
ALT: 20 U/L (ref 12–78)
AST: 25 U/L (ref 15–37)
Albumin: 4.1 g/dL (ref 3.4–5.0)
Anion Gap: 6 — ABNORMAL LOW (ref 7–16)
BILIRUBIN TOTAL: 0.7 mg/dL (ref 0.2–1.0)
BUN: 14 mg/dL (ref 7–18)
CALCIUM: 9 mg/dL (ref 8.5–10.1)
CO2: 32 mmol/L (ref 21–32)
Chloride: 86 mmol/L — ABNORMAL LOW (ref 98–107)
Creatinine: 0.58 mg/dL — ABNORMAL LOW (ref 0.60–1.30)
EGFR (African American): >60
Glucose: 103 mg/dL — ABNORMAL HIGH (ref 65–99)
Osmolality: 250 (ref 275–301)
POTASSIUM: 3.5 mmol/L (ref 3.5–5.1)
Sodium: 124 mmol/L — ABNORMAL LOW (ref 136–145)
Total Protein: 8 g/dL (ref 6.4–8.2)

## 2013-06-03 LAB — APTT: ACTIVATED PTT: 30 s (ref 23.6–35.9)

## 2013-06-03 MED ORDER — CLORAZEPATE DIPOTASSIUM 7.5 MG PO TABS: 7.5000 mg | ORAL_TABLET | Freq: Two times a day (BID) | ORAL | Status: DC | PRN

## 2013-06-04 ENCOUNTER — Inpatient Hospital Stay: Payer: Self-pay | Admitting: Family Medicine

## 2013-06-04 LAB — COMPREHENSIVE METABOLIC PANEL
ALK PHOS: 54 U/L
Albumin: 4 g/dL (ref 3.4–5.0)
Anion Gap: 7 (ref 7–16)
BILIRUBIN TOTAL: 0.9 mg/dL (ref 0.2–1.0)
BUN: 18 mg/dL (ref 7–18)
CREATININE: 0.77 mg/dL (ref 0.60–1.30)
Calcium, Total: 8.5 mg/dL (ref 8.5–10.1)
Chloride: 84 mmol/L — ABNORMAL LOW (ref 98–107)
Co2: 28 mmol/L (ref 21–32)
EGFR (Non-African Amer.): >60
Glucose: 128 mg/dL — ABNORMAL HIGH (ref 65–99)
Osmolality: 244 (ref 275–301)
Potassium: 3.5 mmol/L (ref 3.5–5.1)
SGOT(AST): 28 U/L (ref 15–37)
SGPT (ALT): 18 U/L (ref 12–78)
SODIUM: 119 mmol/L — AB (ref 136–145)
Total Protein: 7.4 g/dL (ref 6.4–8.2)

## 2013-06-04 LAB — CBC WITH DIFFERENTIAL/PLATELET
Basophil #: 0 10*3/uL (ref 0.0–0.1)
Basophil %: 0.1 %
EOS PCT: 0 %
Eosinophil #: 0 10*3/uL (ref 0.0–0.7)
HCT: 29.5 % — AB (ref 40.0–52.0)
HGB: 10.3 g/dL — AB (ref 13.0–18.0)
LYMPHS PCT: 14.3 %
Lymphocyte #: 0.3 10*3/uL — ABNORMAL LOW (ref 1.0–3.6)
MCH: 46.6 pg — ABNORMAL HIGH (ref 26.0–34.0)
MCHC: 35 g/dL (ref 32.0–36.0)
MCV: 133 fL — AB (ref 80–100)
Monocyte #: 0.3 x10 3/mm (ref 0.2–1.0)
Monocyte %: 11.8 %
NEUTROS ABS: 1.6 10*3/uL (ref 1.4–6.5)
Neutrophil %: 73.8 %
Platelet: 183 10*3/uL (ref 150–440)
RBC: 2.21 10*6/uL — AB (ref 4.40–5.90)
RDW: 15 % — AB (ref 11.5–14.5)
WBC: 2.2 10*3/uL — AB (ref 3.8–10.6)

## 2013-06-04 LAB — URIC ACID: URIC ACID: 1.9 mg/dL — AB (ref 3.5–7.2)

## 2013-06-04 LAB — SODIUM
SODIUM: 122 mmol/L — AB (ref 136–145)
Sodium: 122 mmol/L — ABNORMAL LOW (ref 136–145)

## 2013-06-04 LAB — TROPONIN I: Troponin-I: <0.02 ng/mL

## 2013-06-04 LAB — TSH: Thyroid Stimulating Horm: 1.86 u[IU]/mL

## 2013-06-04 LAB — LIPASE, BLOOD: LIPASE: 122 U/L (ref 73–393)

## 2013-06-05 ENCOUNTER — Telehealth: Payer: Self-pay | Admitting: Family Medicine

## 2013-06-05 ENCOUNTER — Encounter: Payer: Self-pay | Admitting: Internal Medicine

## 2013-06-05 LAB — BASIC METABOLIC PANEL
Anion Gap: 6 — ABNORMAL LOW (ref 7–16)
BUN: 13 mg/dL (ref 7–18)
CHLORIDE: 89 mmol/L — AB (ref 98–107)
CO2: 29 mmol/L (ref 21–32)
CREATININE: 0.67 mg/dL (ref 0.60–1.30)
Calcium, Total: 8.1 mg/dL — ABNORMAL LOW (ref 8.5–10.1)
EGFR (African American): >60
EGFR (Non-African Amer.): >60
GLUCOSE: 89 mg/dL (ref 65–99)
OSMOLALITY: 249 (ref 275–301)
POTASSIUM: 3.3 mmol/L — AB (ref 3.5–5.1)
Sodium: 124 mmol/L — ABNORMAL LOW (ref 136–145)

## 2013-06-05 LAB — CBC WITH DIFFERENTIAL/PLATELET
BASOS PCT: 0.2 %
Basophil #: 0 10*3/uL (ref 0.0–0.1)
EOS ABS: 0 10*3/uL (ref 0.0–0.7)
Eosinophil %: 0.7 %
HCT: 26.2 % — AB (ref 40.0–52.0)
HGB: 9.2 g/dL — AB (ref 13.0–18.0)
Lymphocyte #: 0.8 10*3/uL — ABNORMAL LOW (ref 1.0–3.6)
Lymphocyte %: 36.1 %
MCH: 46.9 pg — ABNORMAL HIGH (ref 26.0–34.0)
MCHC: 35 g/dL (ref 32.0–36.0)
MCV: 134 fL — ABNORMAL HIGH (ref 80–100)
MONO ABS: 0.3 x10 3/mm (ref 0.2–1.0)
Monocyte %: 14.4 %
NEUTROS PCT: 48.6 %
Neutrophil #: 1 10*3/uL — ABNORMAL LOW (ref 1.4–6.5)
PLATELETS: 148 10*3/uL — AB (ref 150–440)
RBC: 1.96 10*6/uL — ABNORMAL LOW (ref 4.40–5.90)
RDW: 14.8 % — ABNORMAL HIGH (ref 11.5–14.5)
WBC: 2.1 10*3/uL — ABNORMAL LOW (ref 3.8–10.6)

## 2013-06-05 LAB — SODIUM, URINE, RANDOM: Sodium, Urine Random: 35 mmol/L (ref 20–110)

## 2013-06-05 LAB — OSMOLALITY, URINE: Osmolality: 344 mOsm/kg

## 2013-06-05 LAB — SODIUM
Sodium: 125 mmol/L — ABNORMAL LOW (ref 136–145)
Sodium: 125 mmol/L — ABNORMAL LOW (ref 136–145)
Sodium: 130 mmol/L — ABNORMAL LOW (ref 136–145)

## 2013-06-05 NOTE — Telephone Encounter (Signed)
ON Call Note: phone call from  [facility] ER physician on 06/03/2013 Pt seen for slurred speech, confusion. ER MD felt most likely med SE . Sodium low but other wise work up nml. Appeared he had taken alprazolam as well as clonazepam and an additional unclear psych med. ER MD wanted PCP to be aware of pt confusion about med.  Phone call was cut short  for unknown reason and ER physician did not call back.

## 2013-06-06 ENCOUNTER — Telehealth: Payer: Self-pay | Admitting: Internal Medicine

## 2013-06-06 ENCOUNTER — Ambulatory Visit: Payer: Medicare Other | Admitting: Internal Medicine

## 2013-06-06 LAB — POTASSIUM: POTASSIUM: 3.3 mmol/L — AB (ref 3.5–5.1)

## 2013-06-06 LAB — SODIUM
SODIUM: 133 mmol/L — AB (ref 136–145)
SODIUM: 130 mmol/L — AB (ref 136–145)
Sodium: 132 mmol/L — ABNORMAL LOW (ref 136–145)
Sodium: 132 mmol/L — ABNORMAL LOW (ref 136–145)

## 2013-06-06 NOTE — Telephone Encounter (Signed)
Appt scheduled for today.

## 2013-06-06 NOTE — Telephone Encounter (Signed)
Can we add him this afternoon at 3pm?

## 2013-06-06 NOTE — Telephone Encounter (Signed)
Dr walker  I called Johnny Sanford to see if he could come in this afternoon at 3.  Johnny Sanford stated he was in hospital

## 2013-06-07 LAB — BASIC METABOLIC PANEL
Anion Gap: 6 — ABNORMAL LOW (ref 7–16)
BUN: 13 mg/dL (ref 7–18)
CHLORIDE: 95 mmol/L — AB (ref 98–107)
CO2: 31 mmol/L (ref 21–32)
Calcium, Total: 8.7 mg/dL (ref 8.5–10.1)
Creatinine: 0.7 mg/dL (ref 0.60–1.30)
EGFR (Non-African Amer.): >60
Glucose: 98 mg/dL (ref 65–99)
OSMOLALITY: 265 (ref 275–301)
Potassium: 3.3 mmol/L — ABNORMAL LOW (ref 3.5–5.1)
Sodium: 132 mmol/L — ABNORMAL LOW (ref 136–145)

## 2013-06-24 ENCOUNTER — Ambulatory Visit (INDEPENDENT_AMBULATORY_CARE_PROVIDER_SITE_OTHER): Payer: Medicare Other | Admitting: Internal Medicine

## 2013-06-24 ENCOUNTER — Encounter: Payer: Self-pay | Admitting: Internal Medicine

## 2013-06-24 VITALS — BP 100/62 | HR 82 | Temp 98.6°F | Ht 65.0 in | Wt 168.5 lb

## 2013-06-24 DIAGNOSIS — L989 Disorder of the skin and subcutaneous tissue, unspecified: Secondary | ICD-10-CM

## 2013-06-24 DIAGNOSIS — F411 Generalized anxiety disorder: Secondary | ICD-10-CM

## 2013-06-24 DIAGNOSIS — E871 Hypo-osmolality and hyponatremia: Secondary | ICD-10-CM

## 2013-06-24 DIAGNOSIS — W57XXXA Bitten or stung by nonvenomous insect and other nonvenomous arthropods, initial encounter: Secondary | ICD-10-CM

## 2013-06-24 DIAGNOSIS — S30860A Insect bite (nonvenomous) of lower back and pelvis, initial encounter: Secondary | ICD-10-CM

## 2013-06-24 MED ORDER — GENTAMICIN SULFATE 0.1 % EX OINT: 1.0000 "application " | TOPICAL_OINTMENT | Freq: Three times a day (TID) | CUTANEOUS | Status: DC

## 2013-06-24 MED ORDER — FLUTICASONE PROPIONATE 50 MCG/ACT NA SUSP: 1.0000 | Freq: Every day | NASAL | Status: DC | PRN

## 2013-06-24 NOTE — Assessment & Plan Note (Signed)
Symptoms improved with Lexapro and Clonazepam prn. Will continue.

## 2013-06-24 NOTE — Assessment & Plan Note (Signed)
Tick head removed today. Will have pt apply gentamicin ointment to help prevent secondary bacterial infection. Pt will call or RTC if any fever, headache, or other concerns.

## 2013-06-24 NOTE — Assessment & Plan Note (Signed)
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 Recommend evaluation with dermatology for biopsy. Will set up referral.

## 2013-06-24 NOTE — Progress Notes (Signed)
Pre visit review using our clinic review tool, if applicable. No additional management support is needed unless otherwise documented below in the visit note. 

## 2013-06-24 NOTE — Progress Notes (Signed)
Subjective:    Patient ID: Johnny Sanford, male    DOB: 01/03/1941, 73 y.o.   MRN: [id number]  HPI 73YO male with adrenal insufficiency presents for follow up after recent hospitalization. 5/8 Friday, became confused. Wife took to ED. Sodium level was low 124. BP was increased 200/118. He was given hydralazine, with plan to discharge home. Pt collapsed on route to car. Wife called nephrologist,as pt continued to be confused and weak. Went back to ED, Na level had dropped to 119. Admitted to [facility]. Sodium was corrected with IVF. Started on new medication, pt unsure name. Followed up with nephrology, repeat Na was 135. Has been on fluid restriction, <50ounces per day.  Symptoms of anxiety seem to be improved with Lexapro. Using Clonazepam on occasion.  Two ticks removed yesterday. No fever or headache.  Review of Systems  Constitutional: Positive for fatigue. Negative for fever, chills, activity change, appetite change and unexpected weight change.  Eyes: Negative for visual disturbance.  Respiratory: Negative for cough and shortness of breath.   Cardiovascular: Negative for chest pain, palpitations and leg swelling.  Gastrointestinal: Negative for abdominal pain and abdominal distention.  Genitourinary: Negative for dysuria, urgency and difficulty urinating.  Musculoskeletal: Negative for arthralgias and gait problem.  Skin: Positive for color change and wound (tick bite on right lower back). Negative for rash.  Hematological: Negative for adenopathy.  Psychiatric/Behavioral: Negative for sleep disturbance and dysphoric mood. The patient is not nervous/anxious.        Objective:    BP 100/62  Pulse 82  Temp(Src) 98.6 F (37 C) (Oral)  Ht 5\' 5"  (1.651 m)  Wt 168 lb 8 oz (76.431 kg)  BMI 28.04 kg/m2  SpO2 94% Physical Exam  Constitutional: He is oriented to person, place, and time. He appears well-developed and well-nourished. No distress.  HENT:  Head: Normocephalic and  atraumatic.  Right Ear: External ear normal.  Left Ear: External ear normal.  Nose: Nose normal.  Mouth/Throat: Oropharynx is clear and moist. No oropharyngeal exudate.  Eyes: Conjunctivae and EOM are normal. Pupils are equal, round, and reactive to light. Right eye exhibits no discharge. Left eye exhibits no discharge. No scleral icterus.  Neck: Normal range of motion. Neck supple. No tracheal deviation present. No thyromegaly present.  Cardiovascular: Normal rate, regular rhythm and normal heart sounds.  Exam reveals no gallop and no friction rub.   No murmur heard. Pulmonary/Chest: Effort normal and breath sounds normal. No accessory muscle usage. Not tachypneic. No respiratory distress. He has no decreased breath sounds. He has no wheezes. He has no rhonchi. He has no rales. He exhibits no tenderness.  Musculoskeletal: Normal range of motion. He exhibits no edema.  Lymphadenopathy:    He has no cervical adenopathy.  Neurological: He is alert and oriented to person, place, and time. No cranial nerve deficit. Coordination normal.  Skin: Skin is warm and dry. No rash noted. He is not diaphoretic. There is erythema. No pallor.     Psychiatric: He has a normal mood and affect. His behavior is normal. Judgment and thought content normal.          Assessment & Plan:   Problem List Items Addressed This Visit     Unprioritized   Anxiety state, unspecified     Symptoms improved with Lexapro and Clonazepam prn. Will continue.    Hyponatremia - Primary     S/p Recent hospitalization for hyponatremia secondary to adrenal insufficiency. Pt reports repeat Na as outpatient was normal.  Will request notes from nephrology. Continue fluid restriction. Continue current medications.    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 Recommend evaluation with dermatology for biopsy. Will set up referral.    Relevant Orders      Ambulatory referral to Dermatology   Tick bite of back      Tick head removed today. Will have pt apply gentamicin ointment to help prevent secondary bacterial infection. Pt will call or RTC if any fever, headache, or other concerns.    Relevant Medications      gentamicin (GARAMYCIN) ointment 0.1%       Return in about 4 weeks (around 07/22/2013).

## 2013-06-24 NOTE — Assessment & Plan Note (Signed)
S/p Recent hospitalization for hyponatremia secondary to adrenal insufficiency. Pt reports repeat Na as outpatient was normal. Will request notes from nephrology. Continue fluid restriction. Continue current medications.

## 2013-06-27 ENCOUNTER — Telehealth: Payer: Self-pay | Admitting: Internal Medicine

## 2013-06-27 NOTE — Telephone Encounter (Signed)
Patient dropped off medical records from his last visit at [facility].  Records are in Dr. Derry [email or web address] [email or web address]

## 2013-07-01 ENCOUNTER — Ambulatory Visit: Payer: Medicare Other | Admitting: Internal Medicine

## 2013-07-06 ENCOUNTER — Ambulatory Visit: Payer: Self-pay | Admitting: Oncology

## 2013-07-06 LAB — CBC CANCER CENTER
BASOS PCT: 0.4 %
Basophil #: 0 x10 3/mm (ref 0.0–0.1)
EOS ABS: 0 x10 3/mm (ref 0.0–0.7)
EOS PCT: 0.3 %
HCT: 24.1 % — AB (ref 40.0–52.0)
HGB: 8.2 g/dL — ABNORMAL LOW (ref 13.0–18.0)
LYMPHS ABS: 0.6 x10 3/mm — AB (ref 1.0–3.6)
Lymphocyte %: 26.9 %
MCH: 47.7 pg — AB (ref 26.0–34.0)
MCHC: 34 g/dL (ref 32.0–36.0)
MCV: 140 fL — ABNORMAL HIGH (ref 80–100)
Monocyte #: 0.3 x10 3/mm (ref 0.2–1.0)
Monocyte %: 14.8 %
Neutrophil #: 1.2 x10 3/mm — ABNORMAL LOW (ref 1.4–6.5)
Neutrophil %: 57.6 %
PLATELETS: 236 x10 3/mm (ref 150–440)
RBC: 1.72 10*6/uL — ABNORMAL LOW (ref 4.40–5.90)
RDW: 15.6 % — AB (ref 11.5–14.5)
WBC: 2.1 x10 3/mm — ABNORMAL LOW (ref 3.8–10.6)

## 2013-07-22 ENCOUNTER — Other Ambulatory Visit: Payer: Self-pay | Admitting: *Deleted

## 2013-07-22 DIAGNOSIS — G4700 Insomnia, unspecified: Secondary | ICD-10-CM

## 2013-07-22 MED ORDER — TRAZODONE HCL 50 MG PO TABS: 25.0000 mg | ORAL_TABLET | Freq: Every evening | ORAL | Status: DC | PRN

## 2013-07-27 ENCOUNTER — Ambulatory Visit: Payer: Self-pay | Admitting: Oncology

## 2013-08-01 ENCOUNTER — Ambulatory Visit: Payer: Medicare Other | Admitting: Internal Medicine

## 2013-08-02 ENCOUNTER — Encounter: Payer: Self-pay | Admitting: Internal Medicine

## 2013-08-02 ENCOUNTER — Ambulatory Visit (INDEPENDENT_AMBULATORY_CARE_PROVIDER_SITE_OTHER): Payer: Medicare Other | Admitting: Internal Medicine

## 2013-08-02 VITALS — BP 152/96 | HR 82 | Temp 98.2°F | Ht 65.0 in | Wt 165.5 lb

## 2013-08-02 DIAGNOSIS — R5383 Other fatigue: Secondary | ICD-10-CM

## 2013-08-02 DIAGNOSIS — D473 Essential (hemorrhagic) thrombocythemia: Secondary | ICD-10-CM

## 2013-08-02 DIAGNOSIS — E2740 Unspecified adrenocortical insufficiency: Secondary | ICD-10-CM

## 2013-08-02 DIAGNOSIS — R5381 Other malaise: Secondary | ICD-10-CM

## 2013-08-02 DIAGNOSIS — R5382 Chronic fatigue, unspecified: Secondary | ICD-10-CM

## 2013-08-02 DIAGNOSIS — E2749 Other adrenocortical insufficiency: Secondary | ICD-10-CM

## 2013-08-02 LAB — LACTATE DEHYDROGENASE: LDH: 261 U/L — AB (ref 85–241)

## 2013-08-02 LAB — HEPATIC FUNCTION PANEL A (ARMC)
ALT: 18 U/L (ref 12–78)
Albumin: 3.6 g/dL (ref 3.4–5.0)
Alkaline Phosphatase: 53 U/L
Bilirubin, Direct: 0.1 mg/dL (ref 0.00–0.20)
Bilirubin,Total: 0.4 mg/dL (ref 0.2–1.0)
SGOT(AST): 24 U/L (ref 15–37)
Total Protein: 7.2 g/dL (ref 6.4–8.2)

## 2013-08-02 LAB — BASIC METABOLIC PANEL
ANION GAP: 5 — AB (ref 7–16)
BUN: 12 mg/dL (ref 7–18)
CHLORIDE: 98 mmol/L (ref 98–107)
CREATININE: 0.71 mg/dL (ref 0.60–1.30)
Calcium, Total: 8.6 mg/dL (ref 8.5–10.1)
Co2: 34 mmol/L — ABNORMAL HIGH (ref 21–32)
EGFR (African American): >60
GLUCOSE: 104 mg/dL — AB (ref 65–99)
Osmolality: 274 (ref 275–301)
Potassium: 3.9 mmol/L (ref 3.5–5.1)
SODIUM: 137 mmol/L (ref 136–145)

## 2013-08-02 LAB — IRON AND TIBC
IRON BIND. CAP.(TOTAL): 210 ug/dL — AB (ref 250–450)
IRON SATURATION: 43 %
IRON: 91 ug/dL (ref 65–175)
Unbound Iron-Bind.Cap.: 119 ug/dL

## 2013-08-02 LAB — CBC CANCER CENTER
BASOS ABS: 1 %
Eosinophil: 2 %
HCT: 23.6 % — ABNORMAL LOW (ref 40.0–52.0)
HGB: 8.1 g/dL — ABNORMAL LOW (ref 13.0–18.0)
Lymphocytes: 26 %
MCH: 48.7 pg — ABNORMAL HIGH (ref 26.0–34.0)
MCHC: 34.3 g/dL (ref 32.0–36.0)
MCV: 142 fL — ABNORMAL HIGH (ref 80–100)
Monocytes: 22 %
Platelet: 355 x10 3/mm (ref 150–440)
RBC: 1.66 10*6/uL — AB (ref 4.40–5.90)
RDW: 15.8 % — ABNORMAL HIGH (ref 11.5–14.5)
Segmented Neutrophils: 47 %
Variant Lymphocyte: 2 %
WBC: 1.5 x10 3/mm — AB (ref 3.8–10.6)

## 2013-08-02 LAB — RETICULOCYTES
Absolute Retic Count: 0.0275 10*6/uL (ref 0.019–0.186)
Reticulocyte: 1.66 % (ref 0.4–3.1)

## 2013-08-02 LAB — FERRITIN: FERRITIN (ARMC): 303 ng/mL (ref 8–388)

## 2013-08-02 LAB — FOLATE: Folic Acid: 31.5 ng/mL (ref 3.1–100.0)

## 2013-08-02 NOTE — Progress Notes (Signed)
Pre visit review using our clinic review tool, if applicable. No additional management support is needed unless otherwise documented below in the visit note. 

## 2013-08-02 NOTE — Assessment & Plan Note (Signed)
Noted to have worsening anemia by hematology. Dose of Hydrea decreased. Repeat labs pending for next week. Will follow.

## 2013-08-02 NOTE — Progress Notes (Signed)
Subjective:    Patient ID: Johnny Sanford, male    DOB: 1940/05/22, 73 y.o.   MRN: [id number]  HPI 73YO male presents for follow up.  Seen at [facility] today. Plan to increase dose of Hydroxyurea for one week, then repeat labs. Noted to have low Hgb 8.1, previously 7.5 on check. Worried about this, as this has not worked well for him in the past.  Feeling tired. No focal symptoms. No chest pain, dyspnea, change in bowel habits. Recent Na at nephrologist office was 136.    Review of Systems  Constitutional: Positive for fatigue. Negative for fever, chills, activity change, appetite change and unexpected weight change.  Eyes: Negative for visual disturbance.  Respiratory: Negative for cough and shortness of breath.   Cardiovascular: Negative for chest pain, palpitations and leg swelling.  Gastrointestinal: Negative for abdominal pain and abdominal distention.  Genitourinary: Negative for dysuria, urgency and difficulty urinating.  Musculoskeletal: Negative for arthralgias, gait problem and myalgias.  Skin: Negative for color change and rash.  Neurological: Positive for weakness.  Hematological: Negative for adenopathy.  Psychiatric/Behavioral: Negative for sleep disturbance and dysphoric mood. The patient is not nervous/anxious.        Objective:    BP 152/96  Pulse 82  Temp(Src) 98.2 F (36.8 C) (Oral)  Ht 5\' 5"  (1.651 m)  Wt 165 lb 8 oz (75.07 kg)  BMI 27.54 kg/m2  SpO2 93% Physical Exam  Constitutional: He is oriented to person, place, and time. He appears well-developed and well-nourished. No distress.  HENT:  Head: Normocephalic and atraumatic.  Right Ear: External ear normal.  Left Ear: External ear normal.  Nose: Nose normal.  Mouth/Throat: Oropharynx is clear and moist. No oropharyngeal exudate.  Eyes: Conjunctivae and EOM are normal. Pupils are equal, round, and reactive to light. Right eye exhibits no discharge. Left eye exhibits no discharge. No scleral  icterus.  Neck: Normal range of motion. Neck supple. No tracheal deviation present. No thyromegaly present.  Cardiovascular: Normal rate, regular rhythm and normal heart sounds.  Exam reveals no gallop and no friction rub.   No murmur heard. Pulmonary/Chest: Effort normal. No accessory muscle usage. Not tachypneic. No respiratory distress. He has no decreased breath sounds. He has no wheezes. He has rhonchi (rare, improved compared to previous). He has no rales. He exhibits no tenderness.  Musculoskeletal: Normal range of motion. He exhibits no edema.  Lymphadenopathy:    He has no cervical adenopathy.  Neurological: He is alert and oriented to person, place, and time. No cranial nerve deficit. Coordination normal.  Skin: Skin is warm and dry. No rash noted. He is not diaphoretic. No erythema. No pallor.  Psychiatric: He has a normal mood and affect. His behavior is normal. Judgment and thought content normal.          Assessment & Plan:   Problem List Items Addressed This Visit     Unprioritized   Adrenal insufficiency     Symptoms have been relatively stable with recent Na normal. Will continue to monitor. Follow up in 1 month.    Essential thrombocythemia - Primary     Noted to have worsening anemia by hematology. Dose of Hydrea decreased. Repeat labs pending for next week. Will follow.    Fatigue     Chronic fatigue likely multifactorial with adrenal insufficiency and recent anemia related to use of Hydroxyurea. Plan to follow repeat CBC next week.        Return in about 1 month (  around 09/02/2013) for Recheck.

## 2013-08-02 NOTE — Assessment & Plan Note (Signed)
Symptoms have been relatively stable with recent Na normal. Will continue to monitor. Follow up in 1 month.

## 2013-08-02 NOTE — Assessment & Plan Note (Signed)
Chronic fatigue likely multifactorial with adrenal insufficiency and recent anemia related to use of Hydroxyurea. Plan to follow repeat CBC next week.

## 2013-08-03 LAB — PROT IMMUNOELECTROPHORES(ARMC)

## 2013-08-04 LAB — URINE IEP, RANDOM

## 2013-08-05 ENCOUNTER — Encounter: Payer: Self-pay | Admitting: Internal Medicine

## 2013-08-08 ENCOUNTER — Encounter: Payer: Self-pay | Admitting: Internal Medicine

## 2013-08-08 LAB — BASIC METABOLIC PANEL
Anion Gap: 7 (ref 7–16)
BUN: 12 mg/dL (ref 7–18)
CREATININE: 0.73 mg/dL (ref 0.60–1.30)
Calcium, Total: 8.8 mg/dL (ref 8.5–10.1)
Chloride: 97 mmol/L — ABNORMAL LOW (ref 98–107)
Co2: 33 mmol/L — ABNORMAL HIGH (ref 21–32)
EGFR (Non-African Amer.): >60
Glucose: 112 mg/dL — ABNORMAL HIGH (ref 65–99)
OSMOLALITY: 274 (ref 275–301)
POTASSIUM: 3.7 mmol/L (ref 3.5–5.1)
SODIUM: 137 mmol/L (ref 136–145)

## 2013-08-08 LAB — CBC CANCER CENTER
BASOS ABS: 0 x10 3/mm (ref 0.0–0.1)
Basophil %: 0.4 %
EOS ABS: 0 x10 3/mm (ref 0.0–0.7)
EOS PCT: 0.4 %
HCT: 26.4 % — AB (ref 40.0–52.0)
HGB: 8.9 g/dL — ABNORMAL LOW (ref 13.0–18.0)
LYMPHS ABS: 0.6 x10 3/mm — AB (ref 1.0–3.6)
LYMPHS PCT: 26.9 %
MCH: 47.8 pg — AB (ref 26.0–34.0)
MCHC: 33.6 g/dL (ref 32.0–36.0)
MCV: 142 fL — ABNORMAL HIGH (ref 80–100)
MONO ABS: 0.3 x10 3/mm (ref 0.2–1.0)
Monocyte %: 14.4 %
Neutrophil #: 1.4 x10 3/mm (ref 1.4–6.5)
Neutrophil %: 57.9 %
Platelet: 372 x10 3/mm (ref 150–440)
RBC: 1.86 10*6/uL — ABNORMAL LOW (ref 4.40–5.90)
RDW: 14.9 % — ABNORMAL HIGH (ref 11.5–14.5)
WBC: 2.4 x10 3/mm — ABNORMAL LOW (ref 3.8–10.6)

## 2013-08-08 LAB — OCCULT BLOOD X 1 CARD TO LAB, STOOL
Occult Blood, Feces: NEGATIVE
Occult Blood, Feces: NEGATIVE

## 2013-08-09 ENCOUNTER — Encounter: Payer: Self-pay | Admitting: Internal Medicine

## 2013-08-16 LAB — COMPREHENSIVE METABOLIC PANEL
ALBUMIN: 3.6 g/dL (ref 3.4–5.0)
Alkaline Phosphatase: 54 U/L
Anion Gap: 6 — ABNORMAL LOW (ref 7–16)
BILIRUBIN TOTAL: 0.3 mg/dL (ref 0.2–1.0)
BUN: 14 mg/dL (ref 7–18)
CHLORIDE: 98 mmol/L (ref 98–107)
CO2: 32 mmol/L (ref 21–32)
Calcium, Total: 8.4 mg/dL — ABNORMAL LOW (ref 8.5–10.1)
Creatinine: 0.72 mg/dL (ref 0.60–1.30)
EGFR (African American): >60
GLUCOSE: 91 mg/dL (ref 65–99)
Osmolality: 272 (ref 275–301)
Potassium: 3.7 mmol/L (ref 3.5–5.1)
SGOT(AST): 18 U/L (ref 15–37)
SGPT (ALT): 16 U/L (ref 12–78)
SODIUM: 136 mmol/L (ref 136–145)
TOTAL PROTEIN: 7.2 g/dL (ref 6.4–8.2)

## 2013-08-16 LAB — CBC CANCER CENTER
BASOS PCT: 0.7 %
Basophil #: 0 x10 3/mm (ref 0.0–0.1)
EOS ABS: 0 x10 3/mm (ref 0.0–0.7)
Eosinophil %: 0.6 %
HCT: 27 % — AB (ref 40.0–52.0)
HGB: 9.1 g/dL — ABNORMAL LOW (ref 13.0–18.0)
Lymphocyte #: 0.6 x10 3/mm — ABNORMAL LOW (ref 1.0–3.6)
Lymphocyte %: 23.5 %
MCH: 46.2 pg — AB (ref 26.0–34.0)
MCHC: 33.5 g/dL (ref 32.0–36.0)
MCV: 138 fL — AB (ref 80–100)
MONOS PCT: 22.1 %
Monocyte #: 0.6 x10 3/mm (ref 0.2–1.0)
NEUTROS PCT: 53.1 %
Neutrophil #: 1.4 x10 3/mm (ref 1.4–6.5)
Platelet: 447 x10 3/mm — ABNORMAL HIGH (ref 150–440)
RBC: 1.96 10*6/uL — AB (ref 4.40–5.90)
RDW: 15.1 % — ABNORMAL HIGH (ref 11.5–14.5)
WBC: 2.7 x10 3/mm — AB (ref 3.8–10.6)

## 2013-08-24 LAB — CBC CANCER CENTER
Basophil #: 0 x10 3/mm (ref 0.0–0.1)
Basophil %: 0.6 %
Eosinophil #: 0 x10 3/mm (ref 0.0–0.7)
Eosinophil %: 1.3 %
HCT: 27.8 % — ABNORMAL LOW (ref 40.0–52.0)
HGB: 9.3 g/dL — AB (ref 13.0–18.0)
LYMPHS ABS: 0.6 x10 3/mm — AB (ref 1.0–3.6)
Lymphocyte %: 20.4 %
MCH: 44.6 pg — ABNORMAL HIGH (ref 26.0–34.0)
MCHC: 33.3 g/dL (ref 32.0–36.0)
MCV: 134 fL — ABNORMAL HIGH (ref 80–100)
Monocyte #: 0.5 x10 3/mm (ref 0.2–1.0)
Monocyte %: 16.6 %
NEUTROS ABS: 1.8 x10 3/mm (ref 1.4–6.5)
Neutrophil %: 61.1 %
Platelet: 591 x10 3/mm — ABNORMAL HIGH (ref 150–440)
RBC: 2.08 10*6/uL — ABNORMAL LOW (ref 4.40–5.90)
RDW: 17.3 % — ABNORMAL HIGH (ref 11.5–14.5)
WBC: 3 x10 3/mm — AB (ref 3.8–10.6)

## 2013-08-24 LAB — COMPREHENSIVE METABOLIC PANEL
ALBUMIN: 3.5 g/dL (ref 3.4–5.0)
ANION GAP: 4 — AB (ref 7–16)
Alkaline Phosphatase: 59 U/L
BUN: 14 mg/dL (ref 7–18)
Bilirubin,Total: 0.3 mg/dL (ref 0.2–1.0)
CHLORIDE: 97 mmol/L — AB (ref 98–107)
Calcium, Total: 8.7 mg/dL (ref 8.5–10.1)
Co2: 35 mmol/L — ABNORMAL HIGH (ref 21–32)
Creatinine: 0.85 mg/dL (ref 0.60–1.30)
EGFR (African American): >60
Glucose: 88 mg/dL (ref 65–99)
OSMOLALITY: 272 (ref 275–301)
POTASSIUM: 3.5 mmol/L (ref 3.5–5.1)
SGOT(AST): 20 U/L (ref 15–37)
SGPT (ALT): 19 U/L
Sodium: 136 mmol/L (ref 136–145)
TOTAL PROTEIN: 7.1 g/dL (ref 6.4–8.2)

## 2013-08-27 ENCOUNTER — Ambulatory Visit: Payer: Self-pay | Admitting: Oncology

## 2013-09-05 ENCOUNTER — Ambulatory Visit (INDEPENDENT_AMBULATORY_CARE_PROVIDER_SITE_OTHER): Payer: Medicare Other | Admitting: Cardiovascular Disease

## 2013-09-05 ENCOUNTER — Encounter: Payer: Self-pay | Admitting: Cardiovascular Disease

## 2013-09-05 VITALS — BP 140/80 | HR 71 | Ht 65.5 in | Wt 165.5 lb

## 2013-09-05 DIAGNOSIS — I517 Cardiomegaly: Secondary | ICD-10-CM

## 2013-09-05 DIAGNOSIS — I6523 Occlusion and stenosis of bilateral carotid arteries: Secondary | ICD-10-CM

## 2013-09-05 DIAGNOSIS — I6529 Occlusion and stenosis of unspecified carotid artery: Secondary | ICD-10-CM

## 2013-09-05 DIAGNOSIS — I10 Essential (primary) hypertension: Secondary | ICD-10-CM

## 2013-09-05 DIAGNOSIS — I658 Occlusion and stenosis of other precerebral arteries: Secondary | ICD-10-CM

## 2013-09-05 DIAGNOSIS — E785 Hyperlipidemia, unspecified: Secondary | ICD-10-CM

## 2013-09-05 DIAGNOSIS — E871 Hypo-osmolality and hyponatremia: Secondary | ICD-10-CM

## 2013-09-05 DIAGNOSIS — R0989 Other specified symptoms and signs involving the circulatory and respiratory systems: Secondary | ICD-10-CM

## 2013-09-05 NOTE — Progress Notes (Signed)
Patient ID: Johnny Sanford, male    DOB: 07/30/1940, 73 y.o.   MRN: [id number]  HPI Comments: 73 year old gentleman, patient of Dr. Ronette Deter, with history of GERD, CVA, carotid arterial disease with  carotid endarterectomy on the left in 2003 with 60% disease on the right, hypothyroidism, very labile blood pressures, Started on Florinef by a [facility] physician, history of throat cancer with radiation , who presents for routine followup Admission to the hospital December 2013 with low sodium, April 2014 for hyponatremia, hypertension , again July 2015 with low sodium He has adrenal insufficiency, essential thrombocytopenia on Hydrea He presents for routine followup  Reports that he recently went to the hospital for confusion, feeling dehydrated, sodium was 124. He was discharged home. He continued to have symptoms including vomiting, confusion, dehydration and went back to the hospital with sodium 119. At the time of discharge it went up to 135.  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 He did receive 2 doses of Tolvaptan which significantly improved his sodium level. CT scan of the head was normal. He also had CT scan of the chest showing stable apical pleural scarring, mild emphysema Baseline normal renal function  He was admitted to the hospital 01/12/2012 with confusion, slurred speech, rule out for CVA, sodium was 126   Carotid ultrasound showed no significant disease on the left, 60% blockage on the right  carotid ultrasound December 2013 at [facility] showed atherosclerotic disease with no significant stenosis He was told that he has spinal stenosis  Recent total cholesterol 146, LDL 70  EKG shows normal sinus rhythm with rate 71 beats per minute with no significant ST or T wave changes     Outpatient Encounter Prescriptions as of 09/05/2013  Medication Sig  . aspirin 81 MG EC tablet Take 162 mg by mouth daily.   . budesonide-formoterol (SYMBICORT) 160-4.5 MCG/ACT inhaler Inhale 2 puffs into the lungs  2 (two) times daily.  . clonazePAM (KLONOPIN) 0.5 MG tablet Take 1 tablet (0.5 mg total) by mouth 2 (two) times daily as needed for anxiety.  . Cyanocobalamin (VITAMIN B-12 PO) Take by mouth daily.  Marland Kitchen escitalopram (LEXAPRO) 10 MG tablet Take 1 tablet (10 mg total) by mouth daily.  . Ferrous Sulfate (IRON) 325 (65 FE) MG TABS Take by mouth daily.  . fluorouracil (EFUDEX) 5 % cream Apply topically as needed.  . fluticasone (FLONASE) 50 MCG/ACT nasal spray Place 1 spray into both nostrils daily as needed for rhinitis.  Marland Kitchen gentamicin ointment (GARAMYCIN) 0.1 % Apply 1 application topically 3 (three) times daily.  . hydrALAZINE (APRESOLINE) 25 MG tablet Take 1 tablet (25 mg total) by mouth 3 (three) times daily. Use as needed for BP >160/100  . hydroxyurea (HYDREA) 500 MG capsule Take 5 capsules daily. May take with food to minimize GI side effects.  . isosorbide dinitrate (ISORDIL) 20 MG tablet Take 1 tablet (20 mg total) by mouth 3 (three) times daily as needed.  Marland Kitchen levothyroxine (SYNTHROID, LEVOTHROID) 137 MCG tablet Take 1 tablet (137 mcg total) by mouth daily.  . meclizine (ANTIVERT) 25 MG tablet Take 25 mg by mouth.  . mometasone (ELOCON) 0.1 % lotion as needed.   . OMEGA-3 1000 MG CAPS Take 1 capsule by mouth daily.    Marland Kitchen omeprazole (PRILOSEC) 20 MG capsule Take 1 capsule (20 mg total) by mouth daily.  . simvastatin (ZOCOR) 20 MG tablet Take 1 tablet (20 mg total) by mouth daily.  . traMADol (ULTRAM) 50 MG tablet Take 1 tablet (  50 mg total) by mouth every 8 (eight) hours as needed.  . traZODone (DESYREL) 50 MG tablet Take 0.5-1 tablets (25-50 mg total) by mouth at bedtime as needed for sleep.    Review of Systems  Constitutional: Negative.        Labile blood pressure  HENT: Negative.   Eyes: Negative.   Respiratory: Negative.   Cardiovascular: Negative.   Gastrointestinal: Negative.   Endocrine: Negative.   Musculoskeletal: Negative.   Skin: Negative.   Allergic/Immunologic: Negative.    Neurological: Negative.   Hematological: Negative.   Psychiatric/Behavioral: Negative.   All other systems reviewed and are negative.   BP 140/80  Pulse 71  Ht 5' 5.5" (1.664 m)  Wt 165 lb 8 oz (75.07 kg)  BMI 27.11 kg/m2  Physical Exam  Nursing note and vitals reviewed. Constitutional: He is oriented to person, place, and time. He appears well-developed and well-nourished.  HENT:  Head: Normocephalic.  Nose: Nose normal.  Mouth/Throat: Oropharynx is clear and moist.  Eyes: Conjunctivae are normal. Pupils are equal, round, and reactive to light.  Neck: Normal range of motion. Neck supple. No JVD present.  Cardiovascular: Normal rate, regular rhythm, S1 normal, S2 normal and intact distal pulses.  Exam reveals no gallop and no friction rub.   Murmur heard.  Crescendo systolic murmur is present with a grade of 1/6  Pulmonary/Chest: Effort normal and breath sounds normal. No respiratory distress. He has no wheezes. He has no rales. He exhibits no tenderness.  Abdominal: Soft. Bowel sounds are normal. He exhibits no distension. There is no tenderness.  Musculoskeletal: Normal range of motion. He exhibits no edema and no tenderness.  Lymphadenopathy:    He has no cervical adenopathy.  Neurological: He is alert and oriented to person, place, and time. Coordination normal.  Skin: Skin is warm and dry. No rash noted. No erythema.  Psychiatric: He has a normal mood and affect. His behavior is normal. Judgment and thought content normal.      Assessment and Plan

## 2013-09-05 NOTE — Assessment & Plan Note (Addendum)
Blood pressure relatively stable. Discharge medications from the hospital in July indicated that he was taking Florinef one half pill, 0.05 mg daily for blood pressure support

## 2013-09-05 NOTE — Assessment & Plan Note (Signed)
Moderate bilateral carotid arterial disease. Continue aggressive cholesterol management 

## 2013-09-05 NOTE — Assessment & Plan Note (Addendum)
History of adrenal insufficiency, recently admitted to the hospital July 2015, hospital records reviewed, received 2 doses of Tolvaptan with improvement of his symptoms

## 2013-09-05 NOTE — Patient Instructions (Addendum)
You are doing well. No medication changes were made.  Increase your potassium intake  Please call us if you have new issues that need to be addressed before your next appt.  Your physician wants you to follow-up in: 6 months.  You will receive a reminder letter in the mail two months in advance. If you don't receive a letter, please call our office to schedule the follow-up appointment.

## 2013-09-05 NOTE — Assessment & Plan Note (Signed)
Cholesterol is at goal on the current lipid regimen. No changes to the medications were made.  

## 2013-09-07 LAB — CBC CANCER CENTER
BASOS ABS: 0 x10 3/mm (ref 0.0–0.1)
BASOS PCT: 0.8 %
Eosinophil #: 0 x10 3/mm (ref 0.0–0.7)
Eosinophil %: 0.8 %
HCT: 29.5 % — AB (ref 40.0–52.0)
HGB: 9.9 g/dL — ABNORMAL LOW (ref 13.0–18.0)
Lymphocyte #: 0.7 x10 3/mm — ABNORMAL LOW (ref 1.0–3.6)
Lymphocyte %: 18.9 %
MCH: 42.5 pg — AB (ref 26.0–34.0)
MCHC: 33.7 g/dL (ref 32.0–36.0)
MCV: 126 fL — ABNORMAL HIGH (ref 80–100)
MONOS PCT: 11.9 %
Monocyte #: 0.4 x10 3/mm (ref 0.2–1.0)
Neutrophil #: 2.3 x10 3/mm (ref 1.4–6.5)
Neutrophil %: 67.6 %
Platelet: 705 x10 3/mm — ABNORMAL HIGH (ref 150–440)
RBC: 2.34 10*6/uL — ABNORMAL LOW (ref 4.40–5.90)
RDW: 19.1 % — ABNORMAL HIGH (ref 11.5–14.5)
WBC: 3.5 x10 3/mm — AB (ref 3.8–10.6)

## 2013-09-08 ENCOUNTER — Encounter: Payer: Self-pay | Admitting: Internal Medicine

## 2013-09-08 ENCOUNTER — Ambulatory Visit (INDEPENDENT_AMBULATORY_CARE_PROVIDER_SITE_OTHER): Payer: Medicare Other | Admitting: Internal Medicine

## 2013-09-08 VITALS — BP 126/72 | HR 87 | Resp 14 | Ht 65.5 in | Wt 164.0 lb

## 2013-09-08 DIAGNOSIS — E2749 Other adrenocortical insufficiency: Secondary | ICD-10-CM

## 2013-09-08 DIAGNOSIS — E2740 Unspecified adrenocortical insufficiency: Secondary | ICD-10-CM

## 2013-09-08 DIAGNOSIS — R0989 Other specified symptoms and signs involving the circulatory and respiratory systems: Secondary | ICD-10-CM

## 2013-09-08 DIAGNOSIS — D473 Essential (hemorrhagic) thrombocythemia: Secondary | ICD-10-CM

## 2013-09-08 DIAGNOSIS — I10 Essential (primary) hypertension: Secondary | ICD-10-CM

## 2013-09-08 NOTE — Assessment & Plan Note (Signed)
BP Readings from Last 3 Encounters:  09/08/13 126/72  09/05/13 140/80  08/02/13 152/96   BP recently better controlled off Hydrea. Will continue to monitor.

## 2013-09-08 NOTE — Assessment & Plan Note (Signed)
Recent Na normal. Continue fluid restriction and Florinef.

## 2013-09-08 NOTE — Progress Notes (Signed)
Pre visit review using our clinic review tool, if applicable. No additional management support is needed unless otherwise documented below in the visit note. 

## 2013-09-08 NOTE — Assessment & Plan Note (Signed)
Recently started back on Hydrea. Planned follow up with [facility] in 2 weeks. Will request recent labs and notes.

## 2013-09-08 NOTE — Patient Instructions (Signed)
Continue current medications.  Follow up in 3 months and as needed.

## 2013-09-08 NOTE — Progress Notes (Signed)
   Subjective:    Patient ID: Johnny Sanford, male    DOB: 1940-12-15, 73 y.o.   MRN: [id number]  HPI 73YO male presents for follow up. Feeling well off Hydrea. However, started back on Hydrea 2 weeks ago because of plt count of 705K, WBC 3.5, and Hgb 9.9. Last Na level 137 with Dr. Isaias Cowman Follow up blood counts scheduled on 8/26.   Review of Systems  Constitutional: Negative for fever, chills, activity change, appetite change, fatigue and unexpected weight change.  Eyes: Negative for visual disturbance.  Respiratory: Negative for cough and shortness of breath.   Cardiovascular: Negative for chest pain, palpitations and leg swelling.  Gastrointestinal: Negative for abdominal pain and abdominal distention.  Genitourinary: Negative for dysuria, urgency and difficulty urinating.  Musculoskeletal: Negative for arthralgias and gait problem.  Skin: Negative for color change and rash.  Hematological: Negative for adenopathy.  Psychiatric/Behavioral: Negative for sleep disturbance and dysphoric mood. The patient is not nervous/anxious.        Objective:    BP 126/72  Pulse 87  Resp 14  Ht 5' 5.5" (1.664 m)  Wt 164 lb (74.39 kg)  BMI 26.87 kg/m2  SpO2 97% Physical Exam  Constitutional: He is oriented to person, place, and time. He appears well-developed and well-nourished. No distress.  HENT:  Head: Normocephalic and atraumatic.  Right Ear: External ear normal.  Left Ear: External ear normal.  Nose: Nose normal.  Mouth/Throat: Oropharynx is clear and moist. No oropharyngeal exudate.  Eyes: Conjunctivae and EOM are normal. Pupils are equal, round, and reactive to light. Right eye exhibits no discharge. Left eye exhibits no discharge. No scleral icterus.  Neck: Normal range of motion. Neck supple. No tracheal deviation present. No thyromegaly present.  Cardiovascular: Normal rate, regular rhythm and normal heart sounds.  Exam reveals no gallop and no friction rub.   No murmur  heard. Pulmonary/Chest: Effort normal and breath sounds normal. No accessory muscle usage. Not tachypneic. No respiratory distress. He has no decreased breath sounds. He has no wheezes. He has no rhonchi. He has no rales. He exhibits no tenderness.  Musculoskeletal: Normal range of motion. He exhibits no edema.  Lymphadenopathy:    He has no cervical adenopathy.  Neurological: He is alert and oriented to person, place, and time. No cranial nerve deficit. Coordination normal.  Skin: Skin is warm and dry. No rash noted. He is not diaphoretic. No erythema. No pallor.  Psychiatric: He has a normal mood and affect. His behavior is normal. Judgment and thought content normal.          Assessment & Plan:   Problem List Items Addressed This Visit     Unprioritized   Adrenal insufficiency     Recent Na normal. Continue fluid restriction and Florinef.    Essential thrombocythemia - Primary     Recently started back on Hydrea. Planned follow up with [facility]C in 2 weeks. Will request recent labs and notes.    Labile hypertension      BP Readings from Last 3 Encounters:  09/08/13 126/72  09/05/13 140/80  08/02/13 152/96   BP recently better controlled off Hydrea. Will continue to monitor.    Relevant Medications      hydrALAZINE (APRESOLINE) 25 MG tablet       Return in about 3 months (around 12/09/2013) for Recheck.

## 2013-09-21 LAB — CBC CANCER CENTER
Basophil #: 0 x10 3/mm (ref 0.0–0.1)
Basophil %: 0.6 %
EOS ABS: 0 x10 3/mm (ref 0.0–0.7)
Eosinophil %: 1.1 %
HCT: 32 % — AB (ref 40.0–52.0)
HGB: 10.6 g/dL — ABNORMAL LOW (ref 13.0–18.0)
LYMPHS ABS: 0.6 x10 3/mm — AB (ref 1.0–3.6)
Lymphocyte %: 19.1 %
MCH: 40.8 pg — ABNORMAL HIGH (ref 26.0–34.0)
MCHC: 33.1 g/dL (ref 32.0–36.0)
MCV: 123 fL — AB (ref 80–100)
MONO ABS: 0.4 x10 3/mm (ref 0.2–1.0)
Monocyte %: 11.7 %
Neutrophil #: 2.3 x10 3/mm (ref 1.4–6.5)
Neutrophil %: 67.5 %
Platelet: 379 x10 3/mm (ref 150–440)
RBC: 2.6 10*6/uL — AB (ref 4.40–5.90)
RDW: 19.3 % — ABNORMAL HIGH (ref 11.5–14.5)
WBC: 3.4 x10 3/mm — ABNORMAL LOW (ref 3.8–10.6)

## 2013-09-26 ENCOUNTER — Other Ambulatory Visit: Payer: Self-pay | Admitting: *Deleted

## 2013-09-26 ENCOUNTER — Encounter: Payer: Self-pay | Admitting: Internal Medicine

## 2013-09-26 DIAGNOSIS — F411 Generalized anxiety disorder: Secondary | ICD-10-CM

## 2013-09-26 MED ORDER — ESCITALOPRAM OXALATE 10 MG PO TABS: 10.0000 mg | ORAL_TABLET | Freq: Every day | ORAL | Status: DC

## 2013-09-27 ENCOUNTER — Ambulatory Visit: Payer: Self-pay | Admitting: Oncology

## 2013-10-17 ENCOUNTER — Encounter: Payer: Self-pay | Admitting: Internal Medicine

## 2013-10-19 LAB — CBC CANCER CENTER
BASOS ABS: 0 x10 3/mm (ref 0.0–0.1)
Basophil %: 0.7 %
Eosinophil #: 0 x10 3/mm (ref 0.0–0.7)
Eosinophil %: 0.7 %
HCT: 29.9 % — ABNORMAL LOW (ref 40.0–52.0)
HGB: 10 g/dL — ABNORMAL LOW (ref 13.0–18.0)
LYMPHS PCT: 31.9 %
Lymphocyte #: 0.6 x10 3/mm — ABNORMAL LOW (ref 1.0–3.6)
MCH: 40.2 pg — AB (ref 26.0–34.0)
MCHC: 33.4 g/dL (ref 32.0–36.0)
MCV: 120 fL — ABNORMAL HIGH (ref 80–100)
Monocyte #: 0.4 x10 3/mm (ref 0.2–1.0)
Monocyte %: 19.5 %
Neutrophil #: 0.9 x10 3/mm — ABNORMAL LOW (ref 1.4–6.5)
Neutrophil %: 47.2 %
Platelet: 369 x10 3/mm (ref 150–440)
RBC: 2.48 10*6/uL — ABNORMAL LOW (ref 4.40–5.90)
RDW: 21.3 % — ABNORMAL HIGH (ref 11.5–14.5)
WBC: 1.8 x10 3/mm — CL (ref 3.8–10.6)

## 2013-10-21 ENCOUNTER — Telehealth: Payer: Self-pay | Admitting: *Deleted

## 2013-10-21 ENCOUNTER — Encounter: Payer: Self-pay | Admitting: Internal Medicine

## 2013-10-21 ENCOUNTER — Ambulatory Visit (INDEPENDENT_AMBULATORY_CARE_PROVIDER_SITE_OTHER): Payer: Medicare Other | Admitting: Internal Medicine

## 2013-10-21 VITALS — BP 170/92 | HR 115 | Temp 98.1°F | Ht 65.5 in | Wt 163.5 lb

## 2013-10-21 DIAGNOSIS — I10 Essential (primary) hypertension: Secondary | ICD-10-CM

## 2013-10-21 DIAGNOSIS — F411 Generalized anxiety disorder: Secondary | ICD-10-CM

## 2013-10-21 DIAGNOSIS — D473 Essential (hemorrhagic) thrombocythemia: Secondary | ICD-10-CM

## 2013-10-21 DIAGNOSIS — R0989 Other specified symptoms and signs involving the circulatory and respiratory systems: Secondary | ICD-10-CM

## 2013-10-21 MED ORDER — ESCITALOPRAM OXALATE 20 MG PO TABS: 20.0000 mg | ORAL_TABLET | Freq: Every day | ORAL | Status: DC

## 2013-10-21 MED ORDER — LEVOTHYROXINE SODIUM 137 MCG PO TABS: 137.0000 ug | ORAL_TABLET | Freq: Every day | ORAL | Status: DC

## 2013-10-21 MED ORDER — CLONAZEPAM 0.5 MG PO TABS: 0.5000 mg | ORAL_TABLET | Freq: Three times a day (TID) | ORAL | Status: DC | PRN

## 2013-10-21 NOTE — Progress Notes (Signed)
Subjective:    Patient ID: Johnny Sanford, male    DOB: [birth year], 73 y.o.   MRN: [id number]  HPI 73YO male presents for follow up. Recently seen by Dr. Grayland Ormond and had drop in WBC to 1.8. Pt decreased dose of Hydrea to 3 pills daily. Feeling tired ever since WBC has been lower.  Feeling much more anxious lately, ever since back on Hydrea. Having episodes of panic, which occur all through day. Even during activities such as working in his workshop. Taking Clonazepam for anxiety, which helps somewhat, but takes hours sometimes to work.  Review of Systems  Constitutional: Positive for fatigue. Negative for fever, chills, activity change, appetite change and unexpected weight change.  Eyes: Negative for visual disturbance.  Respiratory: Negative for cough and shortness of breath.   Cardiovascular: Negative for chest pain, palpitations and leg swelling.  Gastrointestinal: Negative for nausea, vomiting, abdominal pain, diarrhea, constipation and abdominal distention.  Genitourinary: Negative for dysuria, urgency and difficulty urinating.  Musculoskeletal: Negative for arthralgias and gait problem.  Skin: Negative for color change and rash.  Hematological: Negative for adenopathy.  Psychiatric/Behavioral: Positive for sleep disturbance. Negative for dysphoric mood. The patient is nervous/anxious.        Objective:    BP 170/92  Pulse 115  Temp(Src) 98.1 F (36.7 C) (Oral)  Ht 5' 5.5" (1.664 m)  Wt 163 lb 8 oz (74.163 kg)  BMI 26.78 kg/m2  SpO2 92% Physical Exam  Constitutional: He is oriented to person, place, and time. He appears well-developed and well-nourished. No distress.  HENT:  Head: Normocephalic and atraumatic.  Right Ear: External ear normal.  Left Ear: External ear normal.  Nose: Nose normal.  Mouth/Throat: Oropharynx is clear and moist. No oropharyngeal exudate.  Eyes: Conjunctivae and EOM are normal. Pupils are equal, round, and reactive to light. Right eye  exhibits no discharge. Left eye exhibits no discharge. No scleral icterus.  Neck: Normal range of motion. Neck supple. No tracheal deviation present. No thyromegaly present.  Cardiovascular: Normal rate, regular rhythm and normal heart sounds.  Exam reveals no gallop and no friction rub.   No murmur heard. Pulmonary/Chest: Effort normal. No accessory muscle usage. Not tachypneic. No respiratory distress. He has no decreased breath sounds. He has no wheezes. He has rhonchi (few scattered). He has no rales. He exhibits no tenderness.  Musculoskeletal: Normal range of motion. He exhibits no edema.  Lymphadenopathy:    He has no cervical adenopathy.  Neurological: He is alert and oriented to person, place, and time. No cranial nerve deficit. Coordination normal.  Skin: Skin is warm and dry. No rash noted. He is not diaphoretic. No erythema. No pallor.  Psychiatric: He has a normal mood and affect. His behavior is normal. Judgment and thought content normal.          Assessment & Plan:   Problem List Items Addressed This Visit     Unprioritized   Anxiety state, unspecified - Primary     Recent worsening of anxiety. Will increase Lexapro to 20mg  daily. Will increase Clonazepam to 0.5mg  po tid. Follow up in 4 weeks and prn.    Relevant Medications      escitalopram (LEXAPRO) tablet      clonazePAM (KLONOPIN)  tablet   Essential thrombocythemia     Continue Hydrea 3pills daily. Plan for repeat CBC in 4 weeks with Dr. Grayland Ormond.    Relevant Medications      clonazePAM (KLONOPIN)  tablet   Labile hypertension  BP Readings from Last 3 Encounters:  10/21/13 170/92  09/08/13 126/72  09/05/13 140/80   BP elevated today, which is unusual for him, as recent BP have been low. Suspect anxiety playing a role. Will have him RTC later this afternoon to have BP recheck.        Return in about 4 weeks (around 11/18/2013) for Recheck.

## 2013-10-21 NOTE — Telephone Encounter (Signed)
Pt came back in today for BP check, 168/88 (R) and 158/80 (L)

## 2013-10-21 NOTE — Assessment & Plan Note (Signed)
Recent worsening of anxiety. Will increase Lexapro to 20mg  daily. Will increase Clonazepam to 0.5mg  po tid. Follow up in 4 weeks and prn.

## 2013-10-21 NOTE — Progress Notes (Signed)
Pre visit review using our clinic review tool, if applicable. No additional management support is needed unless otherwise documented below in the visit note. 

## 2013-10-21 NOTE — Patient Instructions (Signed)
Increase Lexapro to 20mg  daily.  Increase Clonazpam to 0.5mg  up to three times daily as needed for anxiety.  Follow up in 4 weeks or sooner as needed.

## 2013-10-21 NOTE — Assessment & Plan Note (Signed)
Continue Hydrea 3pills daily. Plan for repeat CBC in 4 weeks with Dr. Grayland Ormond.

## 2013-10-21 NOTE — Assessment & Plan Note (Signed)
BP Readings from Last 3 Encounters:  10/21/13 170/92  09/08/13 126/72  09/05/13 140/80   BP elevated today, which is unusual for him, as recent BP have been low. Suspect anxiety playing a role. Will have him RTC later this afternoon to have BP recheck.

## 2013-10-25 ENCOUNTER — Other Ambulatory Visit: Payer: Self-pay | Admitting: *Deleted

## 2013-10-25 DIAGNOSIS — G4700 Insomnia, unspecified: Secondary | ICD-10-CM

## 2013-10-25 MED ORDER — TRAZODONE HCL 50 MG PO TABS: 25.0000 mg | ORAL_TABLET | Freq: Every evening | ORAL | Status: DC | PRN

## 2013-10-27 ENCOUNTER — Ambulatory Visit: Payer: Self-pay | Admitting: Oncology

## 2013-11-07 ENCOUNTER — Other Ambulatory Visit: Payer: Self-pay | Admitting: *Deleted

## 2013-11-10 ENCOUNTER — Other Ambulatory Visit: Payer: Self-pay | Admitting: *Deleted

## 2013-11-10 ENCOUNTER — Telehealth: Payer: Self-pay

## 2013-11-10 MED ORDER — FLUDROCORTISONE ACETATE 0.1 MG PO TABS: 0.1000 mg | ORAL_TABLET | Freq: Every day | ORAL | Status: DC

## 2013-11-10 NOTE — Telephone Encounter (Signed)
Ok refill? 

## 2013-11-17 NOTE — Telephone Encounter (Signed)
error 

## 2013-11-23 LAB — CBC CANCER CENTER
BASOS PCT: 0.8 %
Basophil #: 0 x10 3/mm (ref 0.0–0.1)
Eosinophil #: 0 x10 3/mm (ref 0.0–0.7)
Eosinophil %: 0.5 %
HCT: 28.8 % — ABNORMAL LOW (ref 40.0–52.0)
HGB: 9.7 g/dL — AB (ref 13.0–18.0)
Lymphocyte #: 0.6 x10 3/mm — ABNORMAL LOW (ref 1.0–3.6)
Lymphocyte %: 21 %
MCH: 41.6 pg — ABNORMAL HIGH (ref 26.0–34.0)
MCHC: 33.7 g/dL (ref 32.0–36.0)
MCV: 123 fL — ABNORMAL HIGH (ref 80–100)
Monocyte #: 0.5 x10 3/mm (ref 0.2–1.0)
Monocyte %: 18.3 %
Neutrophil #: 1.6 x10 3/mm (ref 1.4–6.5)
Neutrophil %: 59.4 %
Platelet: 454 x10 3/mm — ABNORMAL HIGH (ref 150–440)
RBC: 2.33 10*6/uL — AB (ref 4.40–5.90)
RDW: 22.1 % — ABNORMAL HIGH (ref 11.5–14.5)
WBC: 2.8 x10 3/mm — AB (ref 3.8–10.6)

## 2013-11-27 ENCOUNTER — Ambulatory Visit: Payer: Self-pay | Admitting: Oncology

## 2013-12-09 ENCOUNTER — Ambulatory Visit (INDEPENDENT_AMBULATORY_CARE_PROVIDER_SITE_OTHER): Payer: Medicare Other | Admitting: Internal Medicine

## 2013-12-09 ENCOUNTER — Encounter: Payer: Self-pay | Admitting: Internal Medicine

## 2013-12-09 VITALS — BP 112/70 | HR 87 | Temp 98.2°F | Ht 65.0 in | Wt 165.2 lb

## 2013-12-09 DIAGNOSIS — F411 Generalized anxiety disorder: Secondary | ICD-10-CM

## 2013-12-09 DIAGNOSIS — I10 Essential (primary) hypertension: Secondary | ICD-10-CM

## 2013-12-09 DIAGNOSIS — R531 Weakness: Secondary | ICD-10-CM

## 2013-12-09 DIAGNOSIS — Z0000 Encounter for general adult medical examination without abnormal findings: Secondary | ICD-10-CM

## 2013-12-09 DIAGNOSIS — E785 Hyperlipidemia, unspecified: Secondary | ICD-10-CM

## 2013-12-09 LAB — LIPID PANEL
CHOLESTEROL: 166 mg/dL (ref 0–200)
HDL: 57.3 mg/dL (ref >39.00)
LDL Cholesterol: 97 mg/dL (ref 0–99)
NonHDL: 108.7
TRIGLYCERIDES: 61 mg/dL (ref 0.0–149.0)
Total CHOL/HDL Ratio: 3
VLDL: 12.2 mg/dL (ref 0.0–40.0)

## 2013-12-09 LAB — TSH: TSH: 0.78 u[IU]/mL (ref 0.35–4.50)

## 2013-12-09 LAB — COMPREHENSIVE METABOLIC PANEL
ALT: 16 U/L (ref 0–53)
AST: 23 U/L (ref 0–37)
Albumin: 3.3 g/dL — ABNORMAL LOW (ref 3.5–5.2)
Alkaline Phosphatase: 42 U/L (ref 39–117)
BILIRUBIN TOTAL: 0.7 mg/dL (ref 0.2–1.2)
BUN: 17 mg/dL (ref 6–23)
CO2: 28 meq/L (ref 19–32)
Calcium: 8.6 mg/dL (ref 8.4–10.5)
Chloride: 94 mEq/L — ABNORMAL LOW (ref 96–112)
Creatinine, Ser: 0.7 mg/dL (ref 0.4–1.5)
GFR: 117.69 mL/min (ref >60.00)
GLUCOSE: 103 mg/dL — AB (ref 70–99)
Potassium: 4.5 mEq/L (ref 3.5–5.1)
Sodium: 132 mEq/L — ABNORMAL LOW (ref 135–145)
TOTAL PROTEIN: 6.5 g/dL (ref 6.0–8.3)

## 2013-12-09 MED ORDER — CLONAZEPAM 0.5 MG PO TABS: 0.5000 mg | ORAL_TABLET | Freq: Three times a day (TID) | ORAL | Status: DC | PRN

## 2013-12-09 NOTE — Assessment & Plan Note (Signed)
Recent worsening of anxiety. Will continue Lexapro and have him increase Clonazepam to 0.5mg  tid. Follow up in 3 months and sooner as needed.

## 2013-12-09 NOTE — Progress Notes (Signed)
Pre visit review using our clinic review tool, if applicable. No additional management support is needed unless otherwise documented below in the visit note. 

## 2013-12-09 NOTE — Patient Instructions (Signed)
Increase Clonazepam to 0.5mg  three times daily as needed for anxiety.  Health Maintenance A healthy lifestyle and preventative care can promote health and wellness.  Maintain regular health, dental, and eye exams.  Eat a healthy diet. Foods like vegetables, fruits, whole grains, low-fat dairy products, and lean protein foods contain the nutrients you need and are low in calories. Decrease your intake of foods high in solid fats, added sugars, and salt. Get information about a proper diet from your health care provider, if necessary.  Regular physical exercise is one of the most important things you can do for your health. Most adults should get at least 150 minutes of moderate-intensity exercise (any activity that increases your heart rate and causes you to sweat) each week. In addition, most adults need muscle-strengthening exercises on 2 or more days a week.   Maintain a healthy weight. The body mass index (BMI) is a screening tool to identify possible weight problems. It provides an estimate of body fat based on height and weight. Your health care provider can find your BMI and can help you achieve or maintain a healthy weight. For males 20 years and older:  A BMI below 18.5 is considered underweight.  A BMI of 18.5 to 24.9 is normal.  A BMI of 25 to 29.9 is considered overweight.  A BMI of 30 and above is considered obese.  Maintain normal blood lipids and cholesterol by exercising and minimizing your intake of saturated fat. Eat a balanced diet with plenty of fruits and vegetables. Blood tests for lipids and cholesterol should begin at age 39 and be repeated every 5 years. If your lipid or cholesterol levels are high, you are over age 60, or you are at high risk for heart disease, you may need your cholesterol levels checked more frequently.Ongoing high lipid and cholesterol levels should be treated with medicines if diet and exercise are not working.  If you smoke, find out from your  health care provider how to quit. If you do not use tobacco, do not start.  Lung cancer screening is recommended for adults aged 51-80 years who are at high risk for developing lung cancer because of a history of smoking. A yearly low-dose CT scan of the lungs is recommended for people who have at least a 30-pack-year history of smoking and are current smokers or have quit within the past 15 years. A pack year of smoking is smoking an average of 1 pack of cigarettes a day for 1 year (for example, a 30-pack-year history of smoking could mean smoking 1 pack a day for 30 years or 2 packs a day for 15 years). Yearly screening should continue until the smoker has stopped smoking for at least 15 years. Yearly screening should be stopped for people who develop a health problem that would prevent them from having lung cancer treatment.  If you choose to drink alcohol, do not have more than 2 drinks per day. One drink is considered to be 12 oz (360 mL) of beer, 5 oz (150 mL) of wine, or 1.5 oz (45 mL) of liquor.  Avoid the use of street drugs. Do not share needles with anyone. Ask for help if you need support or instructions about stopping the use of drugs.  High blood pressure causes heart disease and increases the risk of stroke. Blood pressure should be checked at least every 1-2 years. Ongoing high blood pressure should be treated with medicines if weight loss and exercise are not effective.  If you are 72-33 years old, ask your health care provider if you should take aspirin to prevent heart disease.  Diabetes screening involves taking a blood sample to check your fasting blood sugar level. This should be done once every 3 years after age 32 if you are at a normal weight and without risk factors for diabetes. Testing should be considered at a younger age or be carried out more frequently if you are overweight and have at least 1 risk factor for diabetes.  Colorectal cancer can be detected and often  prevented. Most routine colorectal cancer screening begins at the age of 23 and continues through age 64. However, your health care provider may recommend screening at an earlier age if you have risk factors for colon cancer. On a yearly basis, your health care provider may provide home test kits to check for hidden blood in the stool. A small camera at the end of a tube may be used to directly examine the colon (sigmoidoscopy or colonoscopy) to detect the earliest forms of colorectal cancer. Talk to your health care provider about this at age 62 when routine screening begins. A direct exam of the colon should be repeated every 5-10 years through age 39, unless early forms of precancerous polyps or small growths are found.  People who are at an increased risk for hepatitis B should be screened for this virus. You are considered at high risk for hepatitis B if:  You were born in a country where hepatitis B occurs often. Talk with your health care provider about which countries are considered high risk.  Your parents were born in a high-risk country and you have not received a shot to protect against hepatitis B (hepatitis B vaccine).  You have HIV or AIDS.  You use needles to inject street drugs.  You live with, or have sex with, someone who has hepatitis B.  You are a man who has sex with other men (MSM).  You get hemodialysis treatment.  You take certain medicines for conditions like cancer, organ transplantation, and autoimmune conditions.  Hepatitis C blood testing is recommended for all people born from [birth year] through 1964 and any individual with known risk factors for hepatitis C.  Healthy men should no longer receive prostate-specific antigen (PSA) blood tests as part of routine cancer screening. Talk to your health care provider about prostate cancer screening.  Testicular cancer screening is not recommended for adolescents or adult males who have no symptoms. Screening includes  self-exam, a health care provider exam, and other screening tests. Consult with your health care provider about any symptoms you have or any concerns you have about testicular cancer.  Practice safe sex. Use condoms and avoid high-risk sexual practices to reduce the spread of sexually transmitted infections (STIs).  You should be screened for STIs, including gonorrhea and chlamydia if:  You are sexually active and are younger than 24 years.  You are older than 24 years, and your health care provider tells you that you are at risk for this type of infection.  Your sexual activity has changed since you were last screened, and you are at an increased risk for chlamydia or gonorrhea. Ask your health care provider if you are at risk.  If you are at risk of being infected with HIV, it is recommended that you take a prescription medicine daily to prevent HIV infection. This is called pre-exposure prophylaxis (PrEP). You are considered at risk if:  You are a man who has  sex with other men (MSM).  You are a heterosexual man who is sexually active with multiple partners.  You take drugs by injection.  You are sexually active with a partner who has HIV.  Talk with your health care provider about whether you are at high risk of being infected with HIV. If you choose to begin PrEP, you should first be tested for HIV. You should then be tested every 3 months for as long as you are taking PrEP.  Use sunscreen. Apply sunscreen liberally and repeatedly throughout the day. You should seek shade when your shadow is shorter than you. Protect yourself by wearing long sleeves, pants, a wide-brimmed hat, and sunglasses year round whenever you are outdoors.  Tell your health care provider of new moles or changes in moles, especially if there is a change in shape or color. Also, tell your health care provider if a mole is larger than the size of a pencil eraser.  A one-time screening for abdominal aortic aneurysm  (AAA) and surgical repair of large AAAs by ultrasound is recommended for men aged 20-75 years who are current or former smokers.  Stay current with your vaccines (immunizations). Document Released: 07/12/2007 Document Revised: 01/18/2013 Document Reviewed: 06/10/2010 [facility] Patient Information 2015 [place], [place]. This information is not intended to replace advice given to you by your health care provider. Make sure you discuss any questions you have with your health care provider.

## 2013-12-09 NOTE — Progress Notes (Signed)
The patient is here for annual Medicare Wellness Examination and management of other chronic and acute problems.   The risk factors are reflected in the history.  The roster of all physicians providing medical care to patient - is listed in the Snapshot section of the chart.  Activities of daily living:   The patient is 100% independent in all ADLs: dressing, toileting, feeding as well as independent mobility. Patient lives with wife in home. Has dog in home.  Home safety :  The patient has smoke detectors in the home.  Has medical alert. They wear seatbelts in their car. There are no firearms at home.  There is no violence in the home. They feel safe where they live.  Infectious Risks: There is no risks for hepatitis, STDs or HIV.  There is no  history of blood transfusion.  They have no travel history to infectious disease endemic areas of the world.  Additional Health Care Providers: The patient has not seen their dentist in the last six months. Wears dentures- none They have seen their eye doctor in the last year. Planning for surgery on left eye for removal of skin cancer. Opthalmologist - [place], [facility] They deny hearing issues. They have deferred audiologic testing in the last year.   They do not  have excessive sun exposure. Discussed the need for sun protection: hats,long sleeves and use of sunscreen if there is significant sun exposure.  Dermatologist - Laurel Dimmer, Dr. Sarajane Jews Cardiologist - Rockey Situ Hematologist - Grayland Ormond  Diet: the importance of a healthy diet is discussed. They do have a healthy diet.  The benefits of regular aerobic exercise were discussed. Patient does not exercise because of arthritis.  Depression screen: there are no signs or vegative symptoms of depression- irritability, change in appetite, anhedonia, sadness/tearfullness.  Cognitive assessment: the patient manages all their financial and personal affairs and is actively engaged. They  could relate day,date,year and events.  HCPOA - wife, Mani Celestin Living Will - yes, in place, DNR order in place  The following portions of the patient's history were reviewed and updated as appropriate: allergies, current medications, past family history, past medical history,  past surgical history, past social history and problem list.  Visual acuity was not assessed per patient preference as they have regular follow up with their ophthalmologist. Hearing and body mass index were assessed and reviewed.   During the course of the visit the patient was educated and counseled about appropriate screening and preventive services including : fall prevention , diabetes screening, nutrition counseling, colorectal cancer screening, and recommended immunizations.    ACUTE ISSUES: Continues to feel anxious during the day. Taking Clonazepam 0.5mg  1-2 times daily. Also taking Lexapro.  Also concerned about generalized fatigue and weakness. This has been ongoing for years. No focal symptoms. Occasionally has dark stool, but this is after eating collard greens. Planning to schedule follow up colonoscopy.  Recent PLT 449, Hgb 9.8, WBC 2.8. Cut back on Hydrea to 3 pills daily.  Review of Systems  Constitutional: Positive for fatigue. Negative for fever, chills, activity change, appetite change and unexpected weight change.  Eyes: Negative for visual disturbance.  Respiratory: Negative for cough and shortness of breath.   Cardiovascular: Negative for chest pain, palpitations and leg swelling.  Gastrointestinal: Negative for nausea, vomiting, abdominal pain, diarrhea, constipation and abdominal distention.  Genitourinary: Negative for dysuria, urgency and difficulty urinating.  Musculoskeletal: Negative for arthralgias and gait problem.  Skin: Negative for color change and rash.  Hematological: Negative for adenopathy.  Psychiatric/Behavioral: Negative for sleep disturbance and dysphoric mood.  The patient is nervous/anxious.        Objective:    BP 112/70 mmHg  Pulse 87  Temp(Src) 98.2 F (36.8 C) (Oral)  Ht 5\' 5"  (1.651 m)  Wt 165 lb 4 oz (74.957 kg)  BMI 27.50 kg/m2  SpO2 97% Physical Exam  Constitutional: He is oriented to person, place, and time. He appears well-developed and well-nourished. No distress.  HENT:  Head: Normocephalic and atraumatic.  Right Ear: External ear normal.  Left Ear: External ear normal.  Nose: Nose normal.  Mouth/Throat: Oropharynx is clear and moist. No oropharyngeal exudate.  Eyes: Conjunctivae and EOM are normal. Pupils are equal, round, and reactive to light. Right eye exhibits no discharge. Left eye exhibits no discharge. No scleral icterus.  Neck: Normal range of motion. Neck supple. No tracheal deviation present. No thyromegaly present.  Cardiovascular: Normal rate, regular rhythm and normal heart sounds.  Exam reveals no gallop and no friction rub.   No murmur heard. Pulmonary/Chest: Effort normal and breath sounds normal. No respiratory distress. He has no wheezes. He has no rales. He exhibits no tenderness.  Abdominal: Soft. Bowel sounds are normal. He exhibits no distension and no mass. There is no tenderness. There is no rebound and no guarding.  Musculoskeletal: Normal range of motion. He exhibits no edema.  Lymphadenopathy:    He has no cervical adenopathy.  Neurological: He is alert and oriented to person, place, and time. No cranial nerve deficit. Coordination normal.  Skin: Skin is warm and dry. No rash noted. He is not diaphoretic. No erythema. No pallor.  Psychiatric: He has a normal mood and affect. His behavior is normal. Judgment and thought content normal.          Assessment & Plan:   Problem List Items Addressed This Visit      Unprioritized   Anxiety state    Recent worsening of anxiety. Will continue Lexapro and have him increase Clonazepam to 0.5mg  tid. Follow up in 3 months and sooner as needed.     Relevant Medications      clonazePAM (KLONOPIN)  tablet   Medicare annual wellness visit, subsequent - Primary    General medical exam normal today. Will check labs including CBC, CMP, lipids. Immunizations are UTD. Encouraged pt to use cane for falls prevention. Pt will schedule colonoscopy. Follow up in 3 months and prn.    Relevant Orders      Lipid panel      Comprehensive metabolic panel      TSH   Weakness generalized    Discussed reasons for his gait instability and weakness. Encouraged him to use a cane to help with falls prevention.        Return in about 3 months (around 03/11/2014) for Recheck.

## 2013-12-09 NOTE — Assessment & Plan Note (Signed)
General medical exam normal today. Will check labs including CBC, CMP, lipids. Immunizations are UTD. Encouraged pt to use cane for falls prevention. Pt will schedule colonoscopy. Follow up in 3 months and prn.

## 2013-12-09 NOTE — Assessment & Plan Note (Signed)
Discussed reasons for his gait instability and weakness. Encouraged him to use a cane to help with falls prevention.

## 2013-12-19 ENCOUNTER — Other Ambulatory Visit: Payer: Self-pay | Admitting: *Deleted

## 2013-12-19 MED ORDER — OMEPRAZOLE 20 MG PO CPDR: 20.0000 mg | DELAYED_RELEASE_CAPSULE | Freq: Every day | ORAL | Status: DC

## 2014-01-17 ENCOUNTER — Other Ambulatory Visit: Payer: Self-pay | Admitting: *Deleted

## 2014-01-17 DIAGNOSIS — M542 Cervicalgia: Principal | ICD-10-CM

## 2014-01-17 DIAGNOSIS — G8929 Other chronic pain: Secondary | ICD-10-CM

## 2014-01-17 MED ORDER — TRAMADOL HCL 50 MG PO TABS: 50.0000 mg | ORAL_TABLET | Freq: Three times a day (TID) | ORAL | Status: DC | PRN

## 2014-01-17 NOTE — Telephone Encounter (Signed)
Ok refill? 

## 2014-01-23 ENCOUNTER — Other Ambulatory Visit: Payer: Self-pay | Admitting: *Deleted

## 2014-01-23 DIAGNOSIS — F411 Generalized anxiety disorder: Secondary | ICD-10-CM

## 2014-01-23 MED ORDER — ESCITALOPRAM OXALATE 20 MG PO TABS: 20.0000 mg | ORAL_TABLET | Freq: Every day | ORAL | Status: DC

## 2014-02-08 DIAGNOSIS — H6123 Impacted cerumen, bilateral: Secondary | ICD-10-CM | POA: Diagnosis not present

## 2014-02-08 DIAGNOSIS — J301 Allergic rhinitis due to pollen: Secondary | ICD-10-CM | POA: Diagnosis not present

## 2014-02-09 ENCOUNTER — Encounter: Payer: Self-pay | Admitting: Internal Medicine

## 2014-02-09 DIAGNOSIS — C44109 Unspecified malignant neoplasm of skin of left eyelid, including canthus: Secondary | ICD-10-CM | POA: Diagnosis not present

## 2014-02-09 DIAGNOSIS — G4700 Insomnia, unspecified: Secondary | ICD-10-CM

## 2014-02-09 DIAGNOSIS — C44129 Squamous cell carcinoma of skin of left eyelid, including canthus: Secondary | ICD-10-CM | POA: Diagnosis not present

## 2014-02-09 DIAGNOSIS — C44319 Basal cell carcinoma of skin of other parts of face: Secondary | ICD-10-CM | POA: Diagnosis not present

## 2014-02-10 MED ORDER — TRAZODONE HCL 50 MG PO TABS: 25.0000 mg | ORAL_TABLET | Freq: Every evening | ORAL | Status: DC | PRN

## 2014-02-15 ENCOUNTER — Ambulatory Visit: Payer: Self-pay | Admitting: Oncology

## 2014-02-15 DIAGNOSIS — Z79899 Other long term (current) drug therapy: Secondary | ICD-10-CM | POA: Diagnosis not present

## 2014-02-15 DIAGNOSIS — Z87820 Personal history of traumatic brain injury: Secondary | ICD-10-CM | POA: Diagnosis not present

## 2014-02-15 DIAGNOSIS — Z87891 Personal history of nicotine dependence: Secondary | ICD-10-CM | POA: Diagnosis not present

## 2014-02-15 DIAGNOSIS — D473 Essential (hemorrhagic) thrombocythemia: Secondary | ICD-10-CM | POA: Diagnosis not present

## 2014-02-15 DIAGNOSIS — M069 Rheumatoid arthritis, unspecified: Secondary | ICD-10-CM | POA: Diagnosis not present

## 2014-02-15 LAB — CBC CANCER CENTER
BASOS ABS: 0 x10 3/mm (ref 0.0–0.1)
Basophil %: 0.6 %
Eosinophil #: 0 x10 3/mm (ref 0.0–0.7)
Eosinophil %: 0.9 %
HCT: 30.2 % — AB (ref 40.0–52.0)
HGB: 10.1 g/dL — ABNORMAL LOW (ref 13.0–18.0)
Lymphocyte #: 0.6 x10 3/mm — ABNORMAL LOW (ref 1.0–3.6)
Lymphocyte %: 25.4 %
MCH: 42.7 pg — ABNORMAL HIGH (ref 26.0–34.0)
MCHC: 33.5 g/dL (ref 32.0–36.0)
MCV: 127 fL — ABNORMAL HIGH (ref 80–100)
Monocyte #: 0.4 x10 3/mm (ref 0.2–1.0)
Monocyte %: 16.4 %
NEUTROS ABS: 1.3 x10 3/mm — AB (ref 1.4–6.5)
Neutrophil %: 56.7 %
Platelet: 391 x10 3/mm (ref 150–440)
RBC: 2.37 10*6/uL — ABNORMAL LOW (ref 4.40–5.90)
RDW: 13.7 % (ref 11.5–14.5)
WBC: 2.4 x10 3/mm — ABNORMAL LOW (ref 3.8–10.6)

## 2014-02-27 ENCOUNTER — Ambulatory Visit: Payer: Self-pay | Admitting: Oncology

## 2014-03-14 ENCOUNTER — Encounter: Payer: Self-pay | Admitting: Internal Medicine

## 2014-03-14 ENCOUNTER — Ambulatory Visit (INDEPENDENT_AMBULATORY_CARE_PROVIDER_SITE_OTHER)
Admission: RE | Admit: 2014-03-14 | Discharge: 2014-03-14 | Disposition: A | Discharge status: Home / Self Care | Payer: Medicare Other | Source: Ambulatory Visit | Attending: Internal Medicine | Admitting: Internal Medicine

## 2014-03-14 ENCOUNTER — Ambulatory Visit (INDEPENDENT_AMBULATORY_CARE_PROVIDER_SITE_OTHER): Payer: Medicare Other | Admitting: Internal Medicine

## 2014-03-14 VITALS — BP 137/83 | HR 101 | Temp 98.5°F | Ht 65.0 in | Wt 162.0 lb

## 2014-03-14 DIAGNOSIS — J209 Acute bronchitis, unspecified: Secondary | ICD-10-CM

## 2014-03-14 DIAGNOSIS — F411 Generalized anxiety disorder: Secondary | ICD-10-CM | POA: Diagnosis not present

## 2014-03-14 DIAGNOSIS — R0989 Other specified symptoms and signs involving the circulatory and respiratory systems: Secondary | ICD-10-CM

## 2014-03-14 DIAGNOSIS — E871 Hypo-osmolality and hyponatremia: Secondary | ICD-10-CM

## 2014-03-14 DIAGNOSIS — I10 Essential (primary) hypertension: Secondary | ICD-10-CM

## 2014-03-14 LAB — COMPREHENSIVE METABOLIC PANEL
ALK PHOS: 50 U/L (ref 39–117)
ALT: 13 U/L (ref 0–53)
AST: 20 U/L (ref 0–37)
Albumin: 3.9 g/dL (ref 3.5–5.2)
BILIRUBIN TOTAL: 0.5 mg/dL (ref 0.2–1.2)
BUN: 15 mg/dL (ref 6–23)
CHLORIDE: 89 meq/L — AB (ref 96–112)
CO2: 33 mEq/L — ABNORMAL HIGH (ref 19–32)
Calcium: 8.9 mg/dL (ref 8.4–10.5)
Creatinine, Ser: 0.64 mg/dL (ref 0.40–1.50)
GFR: 130.42 mL/min (ref >60.00)
Glucose, Bld: 94 mg/dL (ref 70–99)
Potassium: 4.4 mEq/L (ref 3.5–5.1)
Sodium: 126 mEq/L — ABNORMAL LOW (ref 135–145)
Total Protein: 7 g/dL (ref 6.0–8.3)

## 2014-03-14 LAB — CBC WITH DIFFERENTIAL/PLATELET
BASOS ABS: 0 10*3/uL (ref 0.0–0.1)
Basophils Relative: 0.3 % (ref 0.0–3.0)
EOS ABS: 0 10*3/uL (ref 0.0–0.7)
Eosinophils Relative: 0.3 % (ref 0.0–5.0)
HCT: 31.9 % — ABNORMAL LOW (ref 39.0–52.0)
HEMOGLOBIN: 10.7 g/dL — AB (ref 13.0–17.0)
Lymphocytes Relative: 11.8 % — ABNORMAL LOW (ref 12.0–46.0)
Lymphs Abs: 0.6 10*3/uL — ABNORMAL LOW (ref 0.7–4.0)
MCHC: 33.5 g/dL (ref 30.0–36.0)
MONO ABS: 0.4 10*3/uL (ref 0.1–1.0)
Monocytes Relative: 9.3 % (ref 3.0–12.0)
NEUTROS ABS: 3.8 10*3/uL (ref 1.4–7.7)
Neutrophils Relative %: 78.3 % — ABNORMAL HIGH (ref 43.0–77.0)
Platelets: 549 10*3/uL — ABNORMAL HIGH (ref 150.0–400.0)
RBC: 2.57 Mil/uL — ABNORMAL LOW (ref 4.22–5.81)
RDW: 15.1 % (ref 11.5–15.5)
WBC: 4.8 10*3/uL (ref 4.0–10.5)

## 2014-03-14 MED ORDER — AMOXICILLIN-POT CLAVULANATE 875-125 MG PO TABS: 1.0000 | ORAL_TABLET | Freq: Two times a day (BID) | ORAL | Status: DC

## 2014-03-14 MED ORDER — ALBUTEROL SULFATE HFA 108 (90 BASE) MCG/ACT IN AERS: 2.0000 | INHALATION_SPRAY | Freq: Four times a day (QID) | RESPIRATORY_TRACT | Status: DC | PRN

## 2014-03-14 MED ORDER — SIMVASTATIN 20 MG PO TABS: 20.0000 mg | ORAL_TABLET | Freq: Every day | ORAL | Status: DC

## 2014-03-14 MED ORDER — OMEPRAZOLE 20 MG PO CPDR: 20.0000 mg | DELAYED_RELEASE_CAPSULE | Freq: Every day | ORAL | Status: DC

## 2014-03-14 MED ORDER — LEVOTHYROXINE SODIUM 137 MCG PO TABS: 137.0000 ug | ORAL_TABLET | Freq: Every day | ORAL | Status: DC

## 2014-03-14 NOTE — Assessment & Plan Note (Signed)
Symptoms most consistent with acute bronchitis. Will start Augmentin. Encouraged him to take a probiotic while on this medication. Follow up with ENT later this week. Rx for Albuterol sent to pharmacy. Previously used Symbicort, but unable to afford.

## 2014-03-14 NOTE — Assessment & Plan Note (Signed)
Symptoms improved slightly with prn Clonazepam. Will continue. Continue Lexapro.

## 2014-03-14 NOTE — Assessment & Plan Note (Signed)
BP Readings from Last 3 Encounters:  03/14/14 137/83  12/09/13 112/70  10/21/13 170/92   Persistent labile HTN. Chronic. Will continue to monitor. PRN hydralazine for BP >160/100.

## 2014-03-14 NOTE — Progress Notes (Signed)
Subjective:    Patient ID: Johnny Sanford, male    DOB: 1940/02/07, 74 y.o.   MRN: [id number]  HPI  [name] presents for follow up.  Last visit, 11/2013 - noted increased anxiety. Dose of Clonazepam increased. Anxiety is improved at times but other times poorly controlled.  Continues to have some labile BP readings. BP tends to decrease with eating food.  Feeling more hoarse recently. Scheduled to see Johnny Sanford this week.  Coughing up more mucous over last few weeks. Typically yellow in color. No fever, chills. No dyspnea.   Past medical, surgical, family and social history per today's encounter.  Review of Systems  Constitutional: Positive for fatigue. Negative for fever, chills, activity change, appetite change and unexpected weight change.  HENT: Positive for congestion, postnasal drip and voice change. Negative for rhinorrhea, sinus pressure and trouble swallowing.   Eyes: Negative for visual disturbance.  Respiratory: Positive for cough. Negative for shortness of breath.   Cardiovascular: Negative for chest pain, palpitations and leg swelling.  Gastrointestinal: Negative for abdominal pain, diarrhea, constipation and abdominal distention.  Genitourinary: Negative for dysuria, urgency and difficulty urinating.  Musculoskeletal: Negative for arthralgias and gait problem.  Skin: Negative for color change and rash.  Hematological: Negative for adenopathy.  Psychiatric/Behavioral: Negative for sleep disturbance and dysphoric mood. The patient is not nervous/anxious.        Objective:    BP 137/83 mmHg  Pulse 101  Temp(Src) 98.5 F (36.9 C) (Oral)  Ht 5\' 5"  (1.651 m)  Wt 162 lb (73.483 kg)  BMI 26.96 kg/m2  SpO2 95% Physical Exam  Constitutional: He is oriented to person, place, and time. He appears well-developed and well-nourished. No distress.  HENT:  Head: Normocephalic and atraumatic.  Right Ear: External ear normal.  Left Ear: External ear normal.  Nose:  Nose normal.  Mouth/Throat: Oropharynx is clear and moist. No oropharyngeal exudate.  Eyes: Conjunctivae and EOM are normal. Pupils are equal, round, and reactive to light. Right eye exhibits no discharge. Left eye exhibits no discharge. No scleral icterus.  Neck: Normal range of motion. Neck supple. No tracheal deviation present. No thyromegaly present.  Cardiovascular: Normal rate, regular rhythm and normal heart sounds.  Exam reveals no gallop and no friction rub.   No murmur heard. Pulmonary/Chest: Effort normal. No accessory muscle usage. No tachypnea. No respiratory distress. He has no decreased breath sounds. He has no wheezes. He has rhonchi (scattered). He has no rales. He exhibits no tenderness.  Musculoskeletal: Normal range of motion. He exhibits no edema.  Lymphadenopathy:    He has no cervical adenopathy.  Neurological: He is alert and oriented to person, place, and time. No cranial nerve deficit. Coordination normal.  Skin: Skin is warm and dry. No rash noted. He is not diaphoretic. No erythema. No pallor.  Psychiatric: He has a normal mood and affect. His behavior is normal. Judgment and thought content normal.          Assessment & Plan:   Problem List Items Addressed This Visit      Unprioritized   Acute bronchitis - Primary    Symptoms most consistent with acute bronchitis. Will start Augmentin. Encouraged him to take a probiotic while on this medication. Follow up with ENT later this week. Rx for Albuterol sent to pharmacy. Previously used Symbicort, but unable to afford.      Relevant Medications   AMOXICILLIN-POT CLAVULANATE 875-125 MG PO TABS   Other Relevant Orders   DG  Chest 2 View   CBC with Differential/Platelet   Anxiety state    Symptoms improved slightly with prn Clonazepam. Will continue. Continue Lexapro.       Hyponatremia    Secondary to adrenal insufficiency. Will check electrolytes with labs today.      Relevant Orders   Comprehensive  metabolic panel   Labile hypertension    BP Readings from Last 3 Encounters:  03/14/14 137/83  12/09/13 112/70  10/21/13 170/92   Persistent labile HTN. Chronic. Will continue to monitor. PRN hydralazine for BP >160/100.      Relevant Medications   simvastatin (ZOCOR) tablet       Return in about 2 weeks (around 03/28/2014) for Recheck.

## 2014-03-14 NOTE — Assessment & Plan Note (Signed)
Secondary to adrenal insufficiency. Will check electrolytes with labs today.

## 2014-03-14 NOTE — Patient Instructions (Addendum)
Chest xray today at [facility], [place].  Start Augmentin twice daily to help treat bronchitis.  Follow up in 2 weeks.

## 2014-03-14 NOTE — Progress Notes (Signed)
Pre visit review using our clinic review tool, if applicable. No additional management support is needed unless otherwise documented below in the visit note. 

## 2014-03-15 ENCOUNTER — Telehealth: Payer: Self-pay | Admitting: *Deleted

## 2014-03-15 ENCOUNTER — Other Ambulatory Visit (INDEPENDENT_AMBULATORY_CARE_PROVIDER_SITE_OTHER): Payer: Medicare Other

## 2014-03-15 DIAGNOSIS — E871 Hypo-osmolality and hyponatremia: Secondary | ICD-10-CM | POA: Diagnosis not present

## 2014-03-15 LAB — BASIC METABOLIC PANEL
BUN: 15 mg/dL (ref 6–23)
CALCIUM: 9.1 mg/dL (ref 8.4–10.5)
CO2: 31 mEq/L (ref 19–32)
CREATININE: 0.68 mg/dL (ref 0.40–1.50)
Chloride: 91 mEq/L — ABNORMAL LOW (ref 96–112)
GFR: 121.6 mL/min (ref >60.00)
GLUCOSE: 88 mg/dL (ref 70–99)
POTASSIUM: 4 meq/L (ref 3.5–5.1)
Sodium: 128 mEq/L — ABNORMAL LOW (ref 135–145)

## 2014-03-15 NOTE — Telephone Encounter (Signed)
BMP for hyponatremia

## 2014-03-15 NOTE — Telephone Encounter (Signed)
What labs and dX?  

## 2014-03-17 ENCOUNTER — Ambulatory Visit (INDEPENDENT_AMBULATORY_CARE_PROVIDER_SITE_OTHER): Payer: Medicare Other | Admitting: Cardiovascular Disease

## 2014-03-17 ENCOUNTER — Encounter: Payer: Self-pay | Admitting: Cardiovascular Disease

## 2014-03-17 VITALS — BP 118/72 | HR 81 | Ht 65.0 in | Wt 161.5 lb

## 2014-03-17 DIAGNOSIS — I6523 Occlusion and stenosis of bilateral carotid arteries: Secondary | ICD-10-CM | POA: Diagnosis not present

## 2014-03-17 DIAGNOSIS — R0989 Other specified symptoms and signs involving the circulatory and respiratory systems: Secondary | ICD-10-CM

## 2014-03-17 DIAGNOSIS — E785 Hyperlipidemia, unspecified: Secondary | ICD-10-CM | POA: Diagnosis not present

## 2014-03-17 DIAGNOSIS — I739 Peripheral vascular disease, unspecified: Secondary | ICD-10-CM | POA: Diagnosis not present

## 2014-03-17 DIAGNOSIS — I517 Cardiomegaly: Secondary | ICD-10-CM | POA: Diagnosis not present

## 2014-03-17 DIAGNOSIS — I10 Essential (primary) hypertension: Secondary | ICD-10-CM

## 2014-03-17 DIAGNOSIS — E871 Hypo-osmolality and hyponatremia: Secondary | ICD-10-CM | POA: Diagnosis not present

## 2014-03-17 MED ORDER — SIMVASTATIN 40 MG PO TABS: 40.0000 mg | ORAL_TABLET | Freq: Every day | ORAL | Status: DC

## 2014-03-17 NOTE — Assessment & Plan Note (Signed)
Stable blood pressures, supported with Florinef. No significant complaints on today's visit

## 2014-03-17 NOTE — Assessment & Plan Note (Signed)
At his request we will check his sodium level again today. Suggested he continue fluid restriction, salt tab We'll need to discuss whether in emergencies he would be a candidate for outpatient Tolvaptan. This is a relatively expensive medication

## 2014-03-17 NOTE — Progress Notes (Signed)
Patient ID: Johnny Sanford, male    DOB: 01/26/1941, 74 y.o.   MRN: [id number]  HPI Comments: 74 year old gentleman, patient of Dr. Ronette Deter, with history of GERD, CVA, carotid arterial disease with  carotid endarterectomy on the left in 2003 with 60% disease on the right, hypothyroidism, very labile blood pressures, Started on [place] by a [facility] physician, history of throat cancer with radiation , who presents for routine followup of his hyponatremia and carotid disease. 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 He is requesting repeat check of his sodium level on today's visit. He has been doing fluid restriction as a treatment for his low sodium. He denies any confusion recently. Otherwise feels well. Denies any significant shortness of breath or chest pain. He has not had follow-up at [facility] for his carotid arterial disease in 2 years Reviews his lab work shows cholesterol has climbed, LDL in the 90s. Likely from dietary indiscretion Continues to have labile blood pressure but not as bad as before  EKG on today's visit shows normal sinus rhythm with rate 81 bpm, no significant ST or T-wave changes  Other past medical history Previously went to the hospital for confusion, feeling dehydrated, sodium was 124. He was discharged home. He continued to have symptoms including vomiting, confusion, dehydration and went back to the hospital with sodium 119. At the time of discharge it went up to 135.  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 He did receive 2 doses of Tolvaptan which significantly improved his sodium level. CT scan of the head was normal. He also had CT scan of the chest showing stable apical pleural scarring, mild emphysema Baseline normal renal  function  He was admitted to the hospital 01/12/2012 with confusion, slurred speech, rule out for CVA, sodium was 126   Carotid ultrasound showed no significant disease on the left, 60% blockage on the right  carotid ultrasound December 2013 at [facility] showed atherosclerotic disease with no significant stenosis He was told that he has spinal stenosis   No Known Allergies  Outpatient Encounter Prescriptions as of 03/17/2014  Medication Sig  . albuterol (PROVENTIL HFA;VENTOLIN HFA) 108 (90 BASE) MCG/ACT inhaler Inhale 2 puffs into the lungs every 6 (six) hours as needed for wheezing or shortness of breath.  Marland Kitchen amoxicillin-clavulanate (AUGMENTIN) 875-125 MG per tablet Take 1 tablet by mouth 2 (two) times daily.  Marland Kitchen aspirin 81 MG EC tablet Take 162 mg by mouth daily.   . budesonide-formoterol (SYMBICORT) 160-4.5 MCG/ACT inhaler Inhale 2 puffs into the lungs 2 (two) times daily.  . clonazePAM (KLONOPIN) 0.5 MG tablet Take 1 tablet (0.5 mg total) by mouth 3 (three) times daily as needed for anxiety.  . Cyanocobalamin (VITAMIN B-12 PO) Take by mouth daily.  Marland Kitchen escitalopram (LEXAPRO) 20 MG tablet Take 1 tablet (20 mg total) by mouth daily.  . fludrocortisone (FLORINEF) 0.1 MG tablet Take 1 tablet (0.1 mg total) by mouth daily.  . fluorouracil (EFUDEX) 5 % cream Apply topically as needed.  . fluticasone (FLONASE) 50 MCG/ACT nasal spray Place 1 spray into both nostrils daily as needed for rhinitis.  . hydrALAZINE (APRESOLINE) 25 MG tablet Take 25 mg by mouth 3 (three) times daily. Use as needed for BP >160/100  . hydroxyurea (HYDREA) 500 MG capsule Take  3 capsules daily. May take with food to minimize GI side effects.  Marland Kitchen levothyroxine (SYNTHROID, LEVOTHROID) 137 MCG tablet Take 1 tablet (137 mcg total) by mouth daily.  . meclizine (ANTIVERT) 25 MG tablet Take 25 mg by mouth.  . mometasone (ELOCON) 0.1 % lotion as needed.   . OMEGA-3 1000 MG CAPS Take 1 capsule by mouth daily.    Marland Kitchen omeprazole (PRILOSEC)  20 MG capsule Take 1 capsule (20 mg total) by mouth daily.  . simvastatin (ZOCOR) 40 MG tablet Take 1 tablet (40 mg total) by mouth daily.  . traMADol (ULTRAM) 50 MG tablet Take 1 tablet (50 mg total) by mouth every 8 (eight) hours as needed.  . traZODone (DESYREL) 50 MG tablet Take 0.5-1 tablets (25-50 mg total) by mouth at bedtime as needed for sleep.  . [DISCONTINUED] simvastatin (ZOCOR) 20 MG tablet Take 1 tablet (20 mg total) by mouth daily.    Past Medical History  Diagnosis Date  . HLD (hyperlipidemia)   . HTN (hypertension)   . GERD (gastroesophageal reflux disease)   . Hypothyroidism   . 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 CVA (cerebral vascular accident)   . DA (degenerative arthritis)     Past Surgical History  Procedure Laterality Date  . Carotid endarterectomy    . Skin lesion excision      Dr. Sarajane Jews, and Dr. Vickki Muff at [place]  reports that he quit smoking about 12 years ago. His smoking use included Cigarettes. He quit after 15 years of use. He has never used smokeless tobacco. He reports that he does not drink alcohol or use illicit drugs.  Family History family history includes Heart disease in his father.  Review of Systems  Constitutional: Negative.        Labile blood pressure  Respiratory: Negative.   Cardiovascular: Negative.   Gastrointestinal: Negative.   Musculoskeletal: Negative.   Skin: Negative.   Neurological: Negative.   Hematological: Negative.   Psychiatric/Behavioral: Negative.   All other systems reviewed and are negative.   BP 118/72 mmHg  Pulse 81  Ht 5\' 5"  (1.651 m)  Wt 161 lb 8 oz (73.256 kg)  BMI 26.88 kg/m2  Physical Exam  Constitutional: He is oriented to person, place, and time. He appears well-developed and well-nourished.  HENT:  Head: Normocephalic.  Nose: Nose normal.  Mouth/Throat: Oropharynx is clear and moist.  Eyes: Conjunctivae are normal. Pupils are equal, round, and reactive to light.   Neck: Normal range of motion. Neck supple. No JVD present.  Cardiovascular: Normal rate, regular rhythm, S1 normal, S2 normal and intact distal pulses.  Exam reveals no gallop and no friction rub.   Murmur heard.  Crescendo systolic murmur is present with a grade of 1/6  Pulmonary/Chest: Effort normal and breath sounds normal. No respiratory distress. He has no wheezes. He has no rales. He exhibits no tenderness.  Abdominal: Soft. Bowel sounds are normal. He exhibits no distension. There is no tenderness.  Musculoskeletal: Normal range of motion. He exhibits no edema or tenderness.  Lymphadenopathy:    He has no cervical adenopathy.  Neurological: He is alert and oriented to person, place, and time. Coordination normal.  Skin: Skin is warm and dry. No rash noted. No erythema.  Psychiatric: He has a normal mood and affect. His behavior is normal. Judgment and thought content normal.      Assessment and Plan   Nursing note and vitals reviewed.

## 2014-03-17 NOTE — Assessment & Plan Note (Signed)
Moderate bilateral carotid arterial disease. Continue aggressive cholesterol management

## 2014-03-17 NOTE — Patient Instructions (Addendum)
You are doing well.  Please increase the simvastatin up to 40 mg daily  Please call us if you have new issues that need to be addressed before your next appt.  Your physician wants you to follow-up in: 6 months.  You will receive a reminder letter in the mail two months in advance. If you don't receive a letter, please call our office to schedule the follow-up appointment.

## 2014-03-17 NOTE — Assessment & Plan Note (Signed)
He will follow-up with vascular for routine carotid ultrasound. Ultrasound was offered through our office if not available through his previous contacts at [facility]

## 2014-03-17 NOTE — Assessment & Plan Note (Signed)
Cholesterol running high likely from weight gain, dietary indiscretion. Recommended he increase simvastatin up to 40 mg daily if tolerated, work on his diet and weight loss

## 2014-03-18 LAB — BASIC METABOLIC PANEL
BUN / CREAT RATIO: 20 (ref 10–22)
BUN: 14 mg/dL (ref 8–27)
CALCIUM: 8.6 mg/dL (ref 8.6–10.2)
CHLORIDE: 92 mmol/L — AB (ref 97–108)
CO2: 26 mmol/L (ref 18–29)
CREATININE: 0.71 mg/dL — AB (ref 0.76–1.27)
GFR calc non Af Amer: 94 mL/min/{1.73_m2} (ref >59)
GFR, EST AFRICAN AMERICAN: 108 mL/min/{1.73_m2} (ref >59)
Glucose: 100 mg/dL — ABNORMAL HIGH (ref 65–99)
POTASSIUM: 4 mmol/L (ref 3.5–5.2)
SODIUM: 133 mmol/L — AB (ref 134–144)

## 2014-03-20 ENCOUNTER — Telehealth: Payer: Self-pay

## 2014-03-20 DIAGNOSIS — I2510 Atherosclerotic heart disease of native coronary artery without angina pectoris: Secondary | ICD-10-CM

## 2014-03-20 DIAGNOSIS — I2583 Coronary atherosclerosis due to lipid rich plaque: Principal | ICD-10-CM

## 2014-03-20 NOTE — Telephone Encounter (Signed)
Lab work looks ok Sodium 133 We can do carotid ultrasound in our clinic Or if they prefer, Dr. Lucky Cowboy can help

## 2014-03-20 NOTE — Telephone Encounter (Signed)
Pt wife called, would like lab results. Also would like to have a referral for a vascular Dr. Quintella Baton they discussed this at pt last appt .

## 2014-03-21 NOTE — Telephone Encounter (Signed)
Reviewed results w/ pt.  He verbalizes understanding and would like to schedule carotid u/s here.

## 2014-03-23 ENCOUNTER — Encounter (INDEPENDENT_AMBULATORY_CARE_PROVIDER_SITE_OTHER): Payer: Medicare Other

## 2014-03-23 DIAGNOSIS — I6523 Occlusion and stenosis of bilateral carotid arteries: Secondary | ICD-10-CM | POA: Diagnosis not present

## 2014-03-23 DIAGNOSIS — I2510 Atherosclerotic heart disease of native coronary artery without angina pectoris: Secondary | ICD-10-CM

## 2014-03-23 DIAGNOSIS — I2583 Coronary atherosclerosis due to lipid rich plaque: Principal | ICD-10-CM

## 2014-03-27 DIAGNOSIS — L570 Actinic keratosis: Secondary | ICD-10-CM | POA: Diagnosis not present

## 2014-03-27 DIAGNOSIS — Z85828 Personal history of other malignant neoplasm of skin: Secondary | ICD-10-CM | POA: Diagnosis not present

## 2014-03-30 DIAGNOSIS — E871 Hypo-osmolality and hyponatremia: Secondary | ICD-10-CM | POA: Diagnosis not present

## 2014-03-30 DIAGNOSIS — D696 Thrombocytopenia, unspecified: Secondary | ICD-10-CM | POA: Diagnosis not present

## 2014-03-30 DIAGNOSIS — E876 Hypokalemia: Secondary | ICD-10-CM | POA: Diagnosis not present

## 2014-03-30 DIAGNOSIS — I959 Hypotension, unspecified: Secondary | ICD-10-CM | POA: Diagnosis not present

## 2014-03-31 ENCOUNTER — Ambulatory Visit (INDEPENDENT_AMBULATORY_CARE_PROVIDER_SITE_OTHER): Payer: Medicare Other | Admitting: Internal Medicine

## 2014-03-31 ENCOUNTER — Encounter: Payer: Self-pay | Admitting: Internal Medicine

## 2014-03-31 VITALS — BP 143/86 | HR 82 | Temp 98.1°F | Ht 65.0 in | Wt 158.1 lb

## 2014-03-31 DIAGNOSIS — I10 Essential (primary) hypertension: Secondary | ICD-10-CM | POA: Diagnosis not present

## 2014-03-31 DIAGNOSIS — I6523 Occlusion and stenosis of bilateral carotid arteries: Secondary | ICD-10-CM | POA: Diagnosis not present

## 2014-03-31 DIAGNOSIS — R05 Cough: Secondary | ICD-10-CM

## 2014-03-31 DIAGNOSIS — R0989 Other specified symptoms and signs involving the circulatory and respiratory systems: Secondary | ICD-10-CM

## 2014-03-31 DIAGNOSIS — R059 Cough, unspecified: Secondary | ICD-10-CM | POA: Insufficient documentation

## 2014-03-31 DIAGNOSIS — R634 Abnormal weight loss: Secondary | ICD-10-CM

## 2014-03-31 DIAGNOSIS — J411 Mucopurulent chronic bronchitis: Secondary | ICD-10-CM

## 2014-03-31 DIAGNOSIS — E871 Hypo-osmolality and hyponatremia: Secondary | ICD-10-CM

## 2014-03-31 DIAGNOSIS — E785 Hyperlipidemia, unspecified: Secondary | ICD-10-CM | POA: Diagnosis not present

## 2014-03-31 LAB — BASIC METABOLIC PANEL
BUN: 13 mg/dL (ref 6–23)
CALCIUM: 8.8 mg/dL (ref 8.4–10.5)
CO2: 34 mEq/L — ABNORMAL HIGH (ref 19–32)
Chloride: 93 mEq/L — ABNORMAL LOW (ref 96–112)
Creatinine, Ser: 0.72 mg/dL (ref 0.40–1.50)
GFR: 113.83 mL/min (ref >60.00)
Glucose, Bld: 115 mg/dL — ABNORMAL HIGH (ref 70–99)
POTASSIUM: 3.4 meq/L — AB (ref 3.5–5.1)
SODIUM: 132 meq/L — AB (ref 135–145)

## 2014-03-31 NOTE — Progress Notes (Signed)
Subjective:    Patient ID: Johnny Sanford, male    DOB: Dec 24, 1940, 74 y.o.   MRN: [id number]  HPI  [name] presents for follow up.  Last seen 2/16 with acute bronchitis. Treated with Augmentin.  Symptoms of cough have been persistent. Does not feel short of breath. Cough is productive of thick mucous. No fever or chest pain. Wife continues to smoke in the home. Pt has been compliant with Symbicort and prn Albuterol.   HL - Recently seen by Dr. Rockey Situ. LDL 97. Dr. Rockey Situ increased the Simvastatin to 40mg . Recent carotid doppler shows worsening plaque.  Hyponatremia - Recently seen by Dr. Isaias Cowman. Needs to have repeat electrolytes today.  Diagnosed with SCC eyelid. Scheduled for MOHS surgery.    Past medical, surgical, family and social history per today's encounter.  Review of Systems  Constitutional: Positive for fatigue. Negative for fever, chills and activity change.  HENT: Negative for congestion, ear discharge, ear pain, hearing loss, nosebleeds, postnasal drip, rhinorrhea, sinus pressure, sneezing, sore throat, tinnitus, trouble swallowing and voice change.   Eyes: Negative for discharge, redness, itching and visual disturbance.  Respiratory: Positive for cough. Negative for chest tightness, shortness of breath, wheezing and stridor.   Cardiovascular: Negative for chest pain and leg swelling.  Gastrointestinal: Negative for nausea, vomiting, diarrhea and constipation.  Musculoskeletal: Positive for myalgias and arthralgias. Negative for neck pain and neck stiffness.  Skin: Negative for color change and rash.  Neurological: Negative for dizziness, facial asymmetry and headaches.  Psychiatric/Behavioral: Negative for sleep disturbance.       Objective:    BP 143/86 mmHg  Pulse 82  Temp(Src) 98.1 F (36.7 C) (Oral)  Ht 5\' 5"  (1.651 m)  Wt 158 lb 2 oz (71.725 kg)  BMI 26.31 kg/m2  SpO2 97% Physical Exam  Constitutional: He is oriented to person, place, and time.  He appears well-developed and well-nourished. No distress.  HENT:  Head: Normocephalic and atraumatic.  Right Ear: External ear normal.  Left Ear: External ear normal.  Nose: Nose normal.  Mouth/Throat: Oropharynx is clear and moist. No oropharyngeal exudate.  Eyes: Conjunctivae and EOM are normal. Pupils are equal, round, and reactive to light. Right eye exhibits no discharge. Left eye exhibits no discharge. No scleral icterus.  Neck: Normal range of motion. Neck supple. No tracheal deviation present. No thyromegaly present.  Cardiovascular: Normal rate, regular rhythm and normal heart sounds.  Exam reveals no gallop and no friction rub.   No murmur heard. Pulmonary/Chest: Effort normal. No accessory muscle usage. No tachypnea. No respiratory distress. He has no decreased breath sounds. He has wheezes. He has rhonchi. He has no rales. He exhibits no tenderness.  Musculoskeletal: Normal range of motion. He exhibits no edema.  Lymphadenopathy:    He has no cervical adenopathy.  Neurological: He is alert and oriented to person, place, and time. No cranial nerve deficit. Coordination normal.  Skin: Skin is warm and dry. No rash noted. He is not diaphoretic. No erythema. No pallor.  Psychiatric: He has a normal mood and affect. His behavior is normal. Judgment and thought content normal.          Assessment & Plan:   Problem List Items Addressed This Visit      Unprioritized   Carotid artery stenosis   COPD (chronic obstructive pulmonary disease)    Chronic cough. Repeat CXR today. Pulmonary evaluation for PFTs. Continue Symbicort. See below discussion about limiting exposure to second hand smoke and limitations of  medical intervention with ongoing exposure.      Cough    Chronic cough, recently worsening. CXR from 2/16 showed focal opacities in LLL. S/p treatment with Augmentin, with no real improvement in symptoms. Will repeat CXR. Strongly encouraged wife to quit smoking in the  home. She does not feel that her smoking is contributing and states that she will "never" quit. She does not feel that her smoking is contributing. Pilar Plate discussion about limitations of medical interventions with ongoing smoke exposure. Will continue Symbicort. Will also set up pulmonary evaluation for PFTs.       Relevant Orders   DG Chest 2 View   Ambulatory referral to Pulmonology   Hyperlipidemia - Primary    Will check lipids next week fasting, in follow up to recent increase in Simvastatin.      Relevant Orders   Comprehensive metabolic panel   Lipid panel   Hyponatremia    Chronic, secondary to SIADH and adrenal insufficiency. Will recheck electrolytes today.      Relevant Orders   Basic Metabolic Panel (BMET)   Labile hypertension    BP Readings from Last 3 Encounters:  03/31/14 143/86  03/17/14 118/72  03/14/14 137/83   BP slightly increased today, but generally has been stable. Will continue prn hydralazine for BP >160/100.      Weight loss    Wt Readings from Last 3 Encounters:  03/31/14 158 lb 2 oz (71.725 kg)  03/17/14 161 lb 8 oz (73.256 kg)  03/14/14 162 lb (73.483 kg)   Body mass index is 26.31 kg/(m^2). Ongoing weight loss concerning for underlying malignancy. Repeat CXR today. Follow up in 4 weeks.          Return in about 4 weeks (around 04/28/2014) for Recheck.

## 2014-03-31 NOTE — Assessment & Plan Note (Signed)
Chronic, secondary to SIADH and adrenal insufficiency. Will recheck electrolytes today.

## 2014-03-31 NOTE — Progress Notes (Signed)
Pre visit review using our clinic review tool, if applicable. No additional management support is needed unless otherwise documented below in the visit note. 

## 2014-03-31 NOTE — Assessment & Plan Note (Signed)
Chronic cough. Repeat CXR today. Pulmonary evaluation for PFTs. Continue Symbicort. See below discussion about limiting exposure to second hand smoke and limitations of medical intervention with ongoing exposure.

## 2014-03-31 NOTE — Assessment & Plan Note (Signed)
Wt Readings from Last 3 Encounters:  03/31/14 158 lb 2 oz (71.725 kg)  03/17/14 161 lb 8 oz (73.256 kg)  03/14/14 162 lb (73.483 kg)   Body mass index is 26.31 kg/(m^2). Ongoing weight loss concerning for underlying malignancy. Repeat CXR today. Follow up in 4 weeks.

## 2014-03-31 NOTE — Assessment & Plan Note (Signed)
Will check lipids next week fasting, in follow up to recent increase in Simvastatin.

## 2014-03-31 NOTE — Patient Instructions (Signed)
Labs next week fasting.  Labs today to check sodium.  Chest xray next week.  We will set up an evaluation with pulmonary physician.

## 2014-03-31 NOTE — Assessment & Plan Note (Addendum)
Chronic cough, recently worsening. CXR from 2/16 showed focal opacities in LLL. S/p treatment with Augmentin, with no real improvement in symptoms. Will repeat CXR. Strongly encouraged wife to quit smoking in the home. She does not feel that her smoking is contributing and states that she will "never" quit. She does not feel that her smoking is contributing. Pilar Plate discussion about limitations of medical interventions with ongoing smoke exposure. Will continue Symbicort. Will also set up pulmonary evaluation for PFTs.

## 2014-03-31 NOTE — Assessment & Plan Note (Signed)
BP Readings from Last 3 Encounters:  03/31/14 143/86  03/17/14 118/72  03/14/14 137/83   BP slightly increased today, but generally has been stable. Will continue prn hydralazine for BP >160/100.

## 2014-04-03 ENCOUNTER — Ambulatory Visit (INDEPENDENT_AMBULATORY_CARE_PROVIDER_SITE_OTHER)
Admission: RE | Admit: 2014-04-03 | Discharge: 2014-04-03 | Disposition: A | Discharge status: Home / Self Care | Payer: Medicare Other | Source: Ambulatory Visit | Attending: Internal Medicine | Admitting: Internal Medicine

## 2014-04-03 ENCOUNTER — Telehealth: Payer: Self-pay | Admitting: Internal Medicine

## 2014-04-03 ENCOUNTER — Other Ambulatory Visit: Payer: Self-pay | Admitting: *Deleted

## 2014-04-03 DIAGNOSIS — R05 Cough: Secondary | ICD-10-CM | POA: Diagnosis not present

## 2014-04-03 DIAGNOSIS — R9389 Abnormal findings on diagnostic imaging of other specified body structures: Secondary | ICD-10-CM

## 2014-04-03 DIAGNOSIS — R059 Cough, unspecified: Secondary | ICD-10-CM

## 2014-04-03 MED ORDER — LEVOFLOXACIN 500 MG PO TABS: 500.0000 mg | ORAL_TABLET | Freq: Every day | ORAL | Status: DC

## 2014-04-03 NOTE — Telephone Encounter (Signed)
Radiology called with report on CXR. Shows possible left lower lobe pneumonia. I would like to start Levaquin 500mg  daily #7. He will need a follow up CXR in 3-4 weeks. CXR also showed some distended loops of bowel. The radiologist has asked me to order an abdominal plain film series to evaluate this. I will place order.

## 2014-04-03 NOTE — Telephone Encounter (Signed)
Notified pt's wife, sent Rx to pharmacy

## 2014-04-05 ENCOUNTER — Telehealth: Payer: Self-pay | Admitting: Internal Medicine

## 2014-04-05 ENCOUNTER — Other Ambulatory Visit: Payer: Medicare Other

## 2014-04-05 ENCOUNTER — Other Ambulatory Visit: Payer: Self-pay | Admitting: *Deleted

## 2014-04-05 ENCOUNTER — Ambulatory Visit (INDEPENDENT_AMBULATORY_CARE_PROVIDER_SITE_OTHER)
Admission: RE | Admit: 2014-04-05 | Discharge: 2014-04-05 | Disposition: A | Discharge status: Home / Self Care | Payer: Medicare Other | Source: Ambulatory Visit | Attending: Internal Medicine | Admitting: Internal Medicine

## 2014-04-05 DIAGNOSIS — R938 Abnormal findings on diagnostic imaging of other specified body structures: Secondary | ICD-10-CM

## 2014-04-05 DIAGNOSIS — R935 Abnormal findings on diagnostic imaging of other abdominal regions, including retroperitoneum: Secondary | ICD-10-CM

## 2014-04-05 DIAGNOSIS — E785 Hyperlipidemia, unspecified: Secondary | ICD-10-CM

## 2014-04-05 DIAGNOSIS — R9389 Abnormal findings on diagnostic imaging of other specified body structures: Secondary | ICD-10-CM

## 2014-04-05 DIAGNOSIS — R05 Cough: Secondary | ICD-10-CM | POA: Diagnosis not present

## 2014-04-05 LAB — COMPREHENSIVE METABOLIC PANEL
ALK PHOS: 46 U/L (ref 39–117)
ALT: 12 U/L (ref 0–53)
AST: 19 U/L (ref 0–37)
Albumin: 3.8 g/dL (ref 3.5–5.2)
BUN: 12 mg/dL (ref 6–23)
CHLORIDE: 94 meq/L — AB (ref 96–112)
CO2: 35 meq/L — AB (ref 19–32)
Calcium: 9 mg/dL (ref 8.4–10.5)
Creatinine, Ser: 0.64 mg/dL (ref 0.40–1.50)
GFR: 130.39 mL/min (ref >60.00)
GLUCOSE: 94 mg/dL (ref 70–99)
POTASSIUM: 3.8 meq/L (ref 3.5–5.1)
Sodium: 133 mEq/L — ABNORMAL LOW (ref 135–145)
Total Bilirubin: 0.5 mg/dL (ref 0.2–1.2)
Total Protein: 7 g/dL (ref 6.0–8.3)

## 2014-04-05 LAB — LIPID PANEL
CHOL/HDL RATIO: 2
CHOLESTEROL: 135 mg/dL (ref 0–200)
HDL: 56.9 mg/dL (ref >39.00)
LDL CALC: 67 mg/dL (ref 0–99)
NonHDL: 78.1
TRIGLYCERIDES: 55 mg/dL (ref 0.0–149.0)
VLDL: 11 mg/dL (ref 0.0–40.0)

## 2014-04-05 NOTE — Telephone Encounter (Signed)
-----   Message from Vernetta Honey, [place] sent at 04/05/2014 12:05 PM EST ----- Notified pt's wife, nancy, agreeable to CT, if scheduled on Monday, needs to be in the afternoon, if Tuesday needs to be in the morning.

## 2014-04-10 DIAGNOSIS — H6123 Impacted cerumen, bilateral: Secondary | ICD-10-CM | POA: Diagnosis not present

## 2014-04-10 DIAGNOSIS — B379 Candidiasis, unspecified: Secondary | ICD-10-CM | POA: Diagnosis not present

## 2014-04-10 DIAGNOSIS — M272 Inflammatory conditions of jaws: Secondary | ICD-10-CM | POA: Diagnosis not present

## 2014-04-10 DIAGNOSIS — H6063 Unspecified chronic otitis externa, bilateral: Secondary | ICD-10-CM | POA: Diagnosis not present

## 2014-04-11 ENCOUNTER — Encounter: Payer: Self-pay | Admitting: Internal Medicine

## 2014-04-11 ENCOUNTER — Ambulatory Visit (INDEPENDENT_AMBULATORY_CARE_PROVIDER_SITE_OTHER): Payer: Medicare Other | Admitting: Internal Medicine

## 2014-04-11 VITALS — BP 110/88 | HR 90 | Temp 98.1°F | Ht 63.5 in | Wt 161.0 lb

## 2014-04-11 DIAGNOSIS — R05 Cough: Secondary | ICD-10-CM | POA: Diagnosis not present

## 2014-04-11 DIAGNOSIS — J411 Mucopurulent chronic bronchitis: Secondary | ICD-10-CM | POA: Diagnosis not present

## 2014-04-11 DIAGNOSIS — R918 Other nonspecific abnormal finding of lung field: Secondary | ICD-10-CM | POA: Diagnosis not present

## 2014-04-11 DIAGNOSIS — Z859 Personal history of malignant neoplasm, unspecified: Secondary | ICD-10-CM | POA: Diagnosis not present

## 2014-04-11 DIAGNOSIS — R059 Cough, unspecified: Secondary | ICD-10-CM

## 2014-04-11 MED ORDER — FLUTICASONE FUROATE-VILANTEROL 200-25 MCG/INH IN AEPB: 200.0000 ug | INHALATION_SPRAY | Freq: Every day | RESPIRATORY_TRACT | Status: DC

## 2014-04-11 NOTE — Patient Instructions (Addendum)
Follow up with Dr. Stevenson Clinch in 4-6 weeks - pulmonary function testing and 6 minute walk test prior to follow up - use your rescue inhaler only as needed - CT Chest with contrast for Hx of cancer\pulmonary nodules, please try to schedule on the same day as your CT of abdomen and pelvis.  - rx for Breo Ellipta (200/25) - 1 inhalation per day, please rinse and gargle after each use. (You were given 2 week sample today).

## 2014-04-11 NOTE — Progress Notes (Signed)
Date: 04/11/2014  MRN# [id number] Johnny Sanford 02/18/1940  Referring Physician: Dr. Ronette Deter  Johnny Sanford is a 74 y.o. old male seen in consultation for cough and shortness of breath for the past 3 weeks  CC: Cough and shortness of breath for the past 3 weeks Chief Complaint  Patient presents with  . Advice Only    Pt has had lung infection for 3 weeks. Pt has been on 2 different abx, he just finished Levaquin 500 mg on 04/09/14. Pt c/o cough with thick white mucus, and some sob. Pt denies wheezing and chest tightness. Pts spouse concerned about weight loss as patient eats alot.    HPI:  She is a pleasant 74 year old male presenting today with above chief complaint. 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 Patient states that he's had a mildly productive intermittent cough over the past 3 weeks, he recently had a upper spray tract infection and was treated with Augmentin by his primary care physician. He had a chest x-ray that showed a possible left lower lobe opacity, he endorses losing about 50 pounds in the last 1 year. And he does endorse some subjective night sweats, along with a hoarse voice. He denies dyspnea on exertion or dyspnea at rest. He admits to smoking on and off for a total of about 7 years, he last quit in 2003, he has had significant secondhand smoke exposure from his wife for 45 years. PMHX:   Past Medical History  Diagnosis Date  . HLD (hyperlipidemia)   . HTN (hypertension)   . GERD (gastroesophageal reflux disease)   . Hypothyroidism   . 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 CVA (cerebral vascular accident)   . DA (degenerative arthritis)    Surgical Hx:  Past Surgical History  Procedure Laterality Date  . Carotid endarterectomy    . Skin  lesion excision      Dr. Sarajane Jews, and Dr. Vickki Muff at [facility] Hx:  Family History  Problem Relation Age of Onset  . Heart disease Father    Social Hx:   History  Substance Use Topics  . Smoking status: Former Smoker -- 15 years    Types: Cigarettes    Quit date: 10/07/2001  . Smokeless tobacco: Never Used     Comment: tobacco use - no  . Alcohol Use: No   Medication:   Current Outpatient Rx  Name  Route  Sig  Dispense  Refill  . albuterol (PROVENTIL HFA;VENTOLIN HFA) 108 (90 BASE) MCG/ACT inhaler   Inhalation   Inhale 2 puffs into the lungs every 6 (six) hours as needed for wheezing or shortness of breath.   1 Inhaler   0   . aspirin 81 MG EC tablet   Oral   Take 162 mg by mouth daily.          . budesonide-formoterol (SYMBICORT) 160-4.5 MCG/ACT inhaler   Inhalation   Inhale 2 puffs into the lungs 2 (two) times daily.   1 Inhaler   3   . clonazePAM (KLONOPIN) 0.5 MG tablet   Oral   Take 1 tablet (0.5 mg total) by mouth 3 (three) times daily as needed for anxiety.   90 tablet   3   . Cyanocobalamin (VITAMIN B-12 PO)   Oral   Take  by mouth daily.         Marland Kitchen escitalopram (LEXAPRO) 20 MG tablet   Oral   Take 1 tablet (20 mg total) by mouth daily.   30 tablet   2   . fludrocortisone (FLORINEF) 0.1 MG tablet   Oral   Take 1 tablet (0.1 mg total) by mouth daily. Patient taking differently: Take 0.05 mg by mouth daily.    30 tablet   5   . fluorouracil (EFUDEX) 5 % cream   Topical   Apply topically as needed.   40 g   1   . fluticasone (FLONASE) 50 MCG/ACT nasal spray   Each Nare   Place 1 spray into both nostrils daily as needed for rhinitis.   16 g   6   . hydrALAZINE (APRESOLINE) 25 MG tablet   Oral   Take 25 mg by mouth 3 (three) times daily. Use as needed for BP >160/100         . hydroxyurea (HYDREA) 500 MG capsule      Take 3 capsules daily. May take with food to minimize GI side effects.         Marland Kitchen levothyroxine (SYNTHROID,  LEVOTHROID) 137 MCG tablet   Oral   Take 1 tablet (137 mcg total) by mouth daily.   90 tablet   3   . meclizine (ANTIVERT) 25 MG tablet   Oral   Take 25 mg by mouth 2 (two) times daily as needed.          . mometasone (ELOCON) 0.1 % lotion      as needed.          . OMEGA-3 1000 MG CAPS   Oral   Take 1 capsule by mouth daily.           Marland Kitchen omeprazole (PRILOSEC) 20 MG capsule   Oral   Take 1 capsule (20 mg total) by mouth daily.   90 capsule   3   . simvastatin (ZOCOR) 40 MG tablet   Oral   Take 1 tablet (40 mg total) by mouth daily.   30 tablet   11   . traMADol (ULTRAM) 50 MG tablet   Oral   Take 1 tablet (50 mg total) by mouth every 8 (eight) hours as needed.   90 tablet   3   . traZODone (DESYREL) 50 MG tablet   Oral   Take 0.5-1 tablets (25-50 mg total) by mouth at bedtime as needed for sleep.   30 tablet   2       Allergies:  Review of patient's allergies indicates no known allergies.  Review of Systems: Gen:  Denies  fever, sweats, chills HEENT: Denies blurred vision, double vision, ear pain, eye pain, hearing loss, nose bleeds, sore throat Cvc:  No dizziness, chest pain or heaviness Resp:   Denies cough or sputum porduction, shortness of breath Gi: Denies swallowing difficulty, stomach pain, nausea or vomiting, diarrhea, constipation, bowel incontinence Gu:  Denies bladder incontinence, burning urine Ext:   No Joint pain, stiffness or swelling Skin: No skin rash, easy bruising or bleeding or hives Endoc:  No polyuria, polydipsia , polyphagia or weight change Psych: No depression, insomnia or hallucinations  Other:  All other systems negative  Physical Examination:   VS: BP 110/88 mmHg  Pulse 90  Temp(Src) 98.1 F (36.7 C) (Oral)  Ht 5' 3.5" (1.613 m)  Wt 161 lb (73.029 kg)  BMI 28.07 kg/m2  SpO2 95%  General  Appearance: No distress  Neuro:without focal findings, mental status, speech normal, alert and oriented, cranial nerves 2-12  intact, reflexes normal and symmetric, sensation grossly normal  HEENT: PERRLA, EOM intact, no ptosis, no other lesions noticed;  Pulmonary: normal breath sounds., diaphragmatic excursion normal.No wheezing, No rales;   Sputum Production:  none CardiovascularNormal S1,S2.  No m/r/g.  Abdominal aorta pulsation normal.    Abdomen: Benign, Soft, non-tender, No masses, hepatosplenomegaly, No lymphadenopathy Renal:  No costovertebral tenderness  GU:  No performed at this time. Endoc: No evident thyromegaly, no signs of acromegaly or Cushing features Skin:   warm, no rashes, no ecchymosis  Extremities: normal, no cyanosis, clubbing, no edema, warm with normal capillary refill. Other findings:   Labs results:   Rad results: (The following images and results were reviewed by Dr. Stevenson Clinch). CT Chest 05/2013 EXAM: CT CHEST WITH CONTRAST  TECHNIQUE: Multidetector CT imaging of the chest was performed during intravenous contrast administration.  CONTRAST: 75 mL Isovue-300  COMPARISON: DG CHEST 2V dated 06/04/2013; CT CHEST W/ CM dated 05/24/2012  FINDINGS: The thoracic inlet is unremarkable.  Stable right precarinal lymph node again containing a fatty hilum. Otherwise no evidence of mediastinal or hilar adenopathy nor masses. Atherosclerotic calcifications within the coronary arteries and aorta. No thoracic aortic aneurysm.  Stable biapical pleural scarring. The 9 mm pulmonary nodule left lower lobe has decreased in size presently measuring 4 mm image 35 series 3. There has been interval resolution of the small bilateral pleural effusions and decreased conspicuity of the linear densities in the lung bases. The smaller nodular densities within the left lung base have otherwise resolved. The ground-glass density in the periphery of the right upper lobe has resolved.  Central airways are patent. Mild centrilobular emphysematous changes are appreciated.  Low attenuating foci are appreciated  within the kidneys likely representing cysts. For the characterization with nonemergent dedicated renal ultrasound recommended. Visualized upper abdominal viscera otherwise unremarkable.  There are no aggressive appearing osseous lesions.  IMPRESSION: Stable biapical pleural scarring. Mild emphysematous changes.  There has been interval resolution of the ground-glass density right upper lobe and near complete resolution of the pulmonary nodules in the left upper lobe. 4 mm residua of the previous 9 mm nodule left lung base. The small bilateral effusions have resolved. Minimal residual linear densities likely representing scarring versus atelectasis within the lung bases. A repeat evaluation in 6-12 months is recommended. To re-evaluate the residual 4 mm nodule. The constellation of findings likely reflect the sequela of resolving interstitial pneumonitis.  Likely bilateral renal cysts further evaluation with nonemergent dedicated renal ultrasound recommended  CXR 04/03/14 CLINICAL DATA: Productive cough.  EXAM: CHEST 2 VIEW  COMPARISON: 03/14/2014, 05/10/2013.  FINDINGS: Mediastinum and hilar structures are normal. Bibasilar atelectasis. Left lower lobe infiltrate cannot be excluded. Cardiomegaly with normal pulmonary vascularity. No pleural effusion or pneumothorax.  Distended loops of small bowel with scattered air-fluid levels noted. Air of the right hemidiaphragm is noted. This is most likely interposition of bowel. To further evaluate these findings abdominal series suggested.  IMPRESSION: 1. Cardiomegaly. No pulmonary venous congestion. Subsegmental atelectasis both lung bases. Left lower lobe pneumonia cannot be excluded.  2. Mildly distended loops of bowel. Air under the right hemidiaphragm is noted. This is most likely air within interpositioned bowel. To further evaluate these findings and to exclude free intraperitoneal air abdominal series suggested.  These results were called by telephone at the time of interpretation on 04/03/2014 at 11:47 am to Dr. Ronette Deter ,  who verbally acknowledged these results.  03/05/14 EXAM: CHEST 2 VIEW  COMPARISON: Chest radiograph 05/10/2013  FINDINGS: Stable cardiac and mediastinal contours with tortuosity of the thoracic aorta. Interval development of focal heterogeneous opacities within the left lung base. No pleural effusion or pneumothorax. Regional skeleton is unremarkable.  IMPRESSION: Focal heterogeneous opacities left lung base may represent infection or atelectasis. Recommend short term radiographic followup to ensure resolution.    Assessment and Plan:  74 year old male with past medical history significant for squamous carcinoma of the left ear, basal cell skin cancer, thrombocytosis seen in consultation for subacute cough. COPD (chronic obstructive pulmonary disease) Patient with clinical history significant for second hand smoke exposure along with a mild smoking history in the past. Clinical symptoms consistent with COPD  Plan: - pulmonary function testing and 6 minute walk test prior to follow up - cont with albuterol rescue inhaler - CT Chest with contrast for Hx of cancer\pulmonary nodules, schedule on the same day as your CT of abdomen and pelvis.  - rx for Breo Ellipta (200/25) - 1 inhalation per day, rinse and gargle after each use. (patient was given 2 week sample today).    Cough Multifactorial: Recent upper respiratory tract infection, postinfectious, exposure to secondhand smoke/irritants, deconditioning  Chronic cough, recently worsening. CXR from 2/16 showed focal opacities in LLL. S/p treatment with Augmentin, with no real improvement in symptoms.  Plan for CT Chest especially with cough and given various history of cancer. Again, agree with Dr. Gilford Rile and strongly encouraged wife to quit smoking in the home.  Continue with symbicort Plan for  PFTs      Updated Medication List Outpatient Encounter Prescriptions as of 04/11/2014  Medication Sig  . albuterol (PROVENTIL HFA;VENTOLIN HFA) 108 (90 BASE) MCG/ACT inhaler Inhale 2 puffs into the lungs every 6 (six) hours as needed for wheezing or shortness of breath.  Marland Kitchen aspirin 81 MG EC tablet Take 162 mg by mouth daily.   . budesonide-formoterol (SYMBICORT) 160-4.5 MCG/ACT inhaler Inhale 2 puffs into the lungs 2 (two) times daily.  . clonazePAM (KLONOPIN) 0.5 MG tablet Take 1 tablet (0.5 mg total) by mouth 3 (three) times daily as needed for anxiety.  . Cyanocobalamin (VITAMIN B-12 PO) Take by mouth daily.  Marland Kitchen escitalopram (LEXAPRO) 20 MG tablet Take 1 tablet (20 mg total) by mouth daily.  . fludrocortisone (FLORINEF) 0.1 MG tablet Take 1 tablet (0.1 mg total) by mouth daily. (Patient taking differently: Take 0.05 mg by mouth daily. )  . fluorouracil (EFUDEX) 5 % cream Apply topically as needed.  . fluticasone (FLONASE) 50 MCG/ACT nasal spray Place 1 spray into both nostrils daily as needed for rhinitis.  . hydrALAZINE (APRESOLINE) 25 MG tablet Take 25 mg by mouth 3 (three) times daily. Use as needed for BP >160/100  . hydroxyurea (HYDREA) 500 MG capsule Take 3 capsules daily. May take with food to minimize GI side effects.  Marland Kitchen levothyroxine (SYNTHROID, LEVOTHROID) 137 MCG tablet Take 1 tablet (137 mcg total) by mouth daily.  . meclizine (ANTIVERT) 25 MG tablet Take 25 mg by mouth 2 (two) times daily as needed.   . mometasone (ELOCON) 0.1 % lotion as needed.   . OMEGA-3 1000 MG CAPS Take 1 capsule by mouth daily.    Marland Kitchen omeprazole (PRILOSEC) 20 MG capsule Take 1 capsule (20 mg total) by mouth daily.  . simvastatin (ZOCOR) 40 MG tablet Take 1 tablet (40 mg total) by mouth daily.  . traMADol (ULTRAM) 50 MG tablet Take 1  tablet (50 mg total) by mouth every 8 (eight) hours as needed.  . traZODone (DESYREL) 50 MG tablet Take 0.5-1 tablets (25-50 mg total) by mouth at bedtime as needed for sleep.   . [DISCONTINUED] levofloxacin (LEVAQUIN) 500 MG tablet Take 1 tablet (500 mg total) by mouth daily. (Patient not taking: Reported on 04/11/2014)    Orders for this visit: Orders Placed This Encounter  Procedures  . CT Chest W Contrast    Standing Status: Future     Number of Occurrences:      Standing Expiration Date: 06/11/2015    Order Specific Question:  Reason for Exam (SYMPTOM  OR DIAGNOSIS REQUIRED)    Answer:  history of cancer/pulmonary nodules    Order Specific Question:  Preferred imaging location?    Answer:  [facility]     Thank  you for the consultation and for allowing [facility] to assist in the care of your patient. Our recommendations are noted above.  Please contact us if we can be of further service.   Vilinda Boehringer, MD  Pulmonary and Critical Care Office Number: [phone]

## 2014-04-12 ENCOUNTER — Encounter: Payer: Self-pay | Admitting: Internal Medicine

## 2014-04-12 ENCOUNTER — Telehealth: Payer: Self-pay | Admitting: Internal Medicine

## 2014-04-12 ENCOUNTER — Ambulatory Visit: Payer: Self-pay | Admitting: Internal Medicine

## 2014-04-12 DIAGNOSIS — N281 Cyst of kidney, acquired: Secondary | ICD-10-CM | POA: Diagnosis not present

## 2014-04-12 DIAGNOSIS — I2510 Atherosclerotic heart disease of native coronary artery without angina pectoris: Secondary | ICD-10-CM | POA: Diagnosis not present

## 2014-04-12 DIAGNOSIS — R918 Other nonspecific abnormal finding of lung field: Secondary | ICD-10-CM | POA: Diagnosis not present

## 2014-04-12 NOTE — Telephone Encounter (Signed)
Order has been faxed to [phone]. Nothing further was needed.

## 2014-04-13 ENCOUNTER — Encounter: Payer: Self-pay | Admitting: Internal Medicine

## 2014-04-13 ENCOUNTER — Telehealth: Payer: Self-pay | Admitting: Internal Medicine

## 2014-04-13 DIAGNOSIS — I2510 Atherosclerotic heart disease of native coronary artery without angina pectoris: Secondary | ICD-10-CM

## 2014-04-13 DIAGNOSIS — I700 Atherosclerosis of aorta: Secondary | ICD-10-CM

## 2014-04-13 DIAGNOSIS — R918 Other nonspecific abnormal finding of lung field: Secondary | ICD-10-CM

## 2014-04-13 NOTE — Telephone Encounter (Signed)
CT abdomen

## 2014-04-25 NOTE — Assessment & Plan Note (Signed)
Patient with clinical history significant for second hand smoke exposure along with a mild smoking history in the past. Clinical symptoms consistent with COPD  Plan: - pulmonary function testing and 6 minute walk test prior to follow up - cont with albuterol rescue inhaler - CT Chest with contrast for Hx of cancer\pulmonary nodules, schedule on the same day as your CT of abdomen and pelvis.  - rx for Breo Ellipta (200/25) - 1 inhalation per day, rinse and gargle after each use. (patient was given 2 week sample today).

## 2014-04-25 NOTE — Assessment & Plan Note (Signed)
Multifactorial: Recent upper respiratory tract infection, postinfectious, exposure to secondhand smoke/irritants, deconditioning  Chronic cough, recently worsening. CXR from 2/16 showed focal opacities in LLL. S/p treatment with Augmentin, with no real improvement in symptoms.  Plan for CT Chest especially with cough and given various history of cancer. Again, agree with Dr. Gilford Rile and strongly encouraged wife to quit smoking in the home.  Continue with symbicort Plan for PFTs

## 2014-05-04 ENCOUNTER — Encounter: Payer: Self-pay | Admitting: Internal Medicine

## 2014-05-04 ENCOUNTER — Ambulatory Visit (INDEPENDENT_AMBULATORY_CARE_PROVIDER_SITE_OTHER): Payer: Medicare Other | Admitting: Internal Medicine

## 2014-05-04 VITALS — BP 160/83 | HR 87 | Temp 98.7°F | Ht 65.0 in | Wt 160.0 lb

## 2014-05-04 DIAGNOSIS — J411 Mucopurulent chronic bronchitis: Secondary | ICD-10-CM | POA: Diagnosis not present

## 2014-05-04 DIAGNOSIS — C44121 Squamous cell carcinoma of skin of unspecified eyelid, including canthus: Secondary | ICD-10-CM

## 2014-05-04 DIAGNOSIS — J309 Allergic rhinitis, unspecified: Secondary | ICD-10-CM | POA: Insufficient documentation

## 2014-05-04 NOTE — Assessment & Plan Note (Signed)
Scheduled for MOHS procedure. Will follow up after surgery.

## 2014-05-04 NOTE — Progress Notes (Signed)
Subjective:    Patient ID: Johnny Sanford, male    DOB: February 15, 1940, 74 y.o.   MRN: [id number]  HPI  [name] presents for follow up.  Having runny nose, cough. Attributes to allergies. Using Breo-Ellipta with no improvement. Taking Allegra with no improvement. Tried Flonase with no improvement as well as a sample from ENT of Atrovent nasal spray. Follow up with ENT scheduled for next Friday.  SCC left lower eyelid - Scheduled for MOHS procedure and reconstruction Monday and Tuesday.   COPD - recently seen by Dr. Stevenson Clinch. Had CT chest which was stable. Scheduled for PFTs. No recent dyspnea or cough. Cough productive of thick sputum at times, which is chronic for him. Wife continues to smoke in the home.  Stopped Simvastatin. Feels less anxious off medication. Less staggering gait.  Past medical, surgical, family and social history per today's encounter.  Review of Systems  Constitutional: Negative for fever, chills, activity change, appetite change, fatigue and unexpected weight change.  HENT: Positive for congestion, postnasal drip and rhinorrhea. Negative for sinus pressure, sneezing, sore throat, trouble swallowing and voice change.   Eyes: Negative for visual disturbance.  Respiratory: Positive for cough. Negative for shortness of breath.   Cardiovascular: Negative for chest pain, palpitations and leg swelling.  Gastrointestinal: Negative for abdominal pain and abdominal distention.  Genitourinary: Negative for dysuria, urgency and difficulty urinating.  Musculoskeletal: Negative for arthralgias and gait problem.  Skin: Negative for color change and rash.  Hematological: Negative for adenopathy.  Psychiatric/Behavioral: Negative for sleep disturbance and dysphoric mood. The patient is not nervous/anxious.        Objective:    BP 160/83 mmHg  Pulse 87  Temp(Src) 98.7 F (37.1 C) (Oral)  Ht 5\' 5"  (1.651 m)  Wt 160 lb (72.576 kg)  BMI 26.63 kg/m2  SpO2 92% Physical  Exam  Constitutional: He is oriented to person, place, and time. He appears well-developed and well-nourished. No distress.  HENT:  Head: Normocephalic and atraumatic.  Right Ear: External ear normal.  Left Ear: External ear normal.  Nose: Nose normal.  Mouth/Throat: Oropharynx is clear and moist. No oropharyngeal exudate.  Eyes: Conjunctivae and EOM are normal. Pupils are equal, round, and reactive to light. Right eye exhibits no discharge. Left eye exhibits no discharge. No scleral icterus.  Neck: Normal range of motion. Neck supple. No tracheal deviation present. No thyromegaly present.  Cardiovascular: Normal rate, regular rhythm and normal heart sounds.  Exam reveals no gallop and no friction rub.   No murmur heard. Pulmonary/Chest: Effort normal. No accessory muscle usage. No tachypnea. No respiratory distress. He has no decreased breath sounds. He has no wheezes. He has rhonchi (scattered). He has no rales. He exhibits no tenderness.  Musculoskeletal: Normal range of motion. He exhibits no edema.  Lymphadenopathy:    He has no cervical adenopathy.  Neurological: He is alert and oriented to person, place, and time. No cranial nerve deficit. Coordination normal.  Skin: Skin is warm and dry. No rash noted. He is not diaphoretic. No erythema. No pallor.  Psychiatric: He has a normal mood and affect. His behavior is normal. Judgment and thought content normal.          Assessment & Plan:   Problem List Items Addressed This Visit      Unprioritized   Allergic rhinitis - Primary    Allergic rhinitis. No improvement with non-sedating antihistamines, nasal steroid. ENT evaluation pending. May need to consider allergy shots. Also discussed adding  prednisone, however holding given upcoming surgery.      COPD (chronic obstructive pulmonary disease)    Symptoms of chronic cough c/w COPD. Minimal improvement with Breo-Ellipta. We have discussed limits of medical management with chronic  second hand smoke exposure.      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 Will follow up after surgery.          Return in about 4 weeks (around 06/01/2014) for Recheck.

## 2014-05-04 NOTE — Assessment & Plan Note (Signed)
Symptoms of chronic cough c/w COPD. Minimal improvement with Breo-Ellipta. We have discussed limits of medical management with chronic second hand smoke exposure.

## 2014-05-04 NOTE — Progress Notes (Signed)
Pre visit review using our clinic review tool, if applicable. No additional management support is needed unless otherwise documented below in the visit note. 

## 2014-05-04 NOTE — Patient Instructions (Addendum)
Try adding Benadryl 25mg  up to every 6 hours to help with allergy symptoms.  Follow up in 4 weeks.

## 2014-05-04 NOTE — Assessment & Plan Note (Signed)
Allergic rhinitis. No improvement with non-sedating antihistamines, nasal steroid. ENT evaluation pending. May need to consider allergy shots. Also discussed adding prednisone, however holding given upcoming surgery.

## 2014-05-05 ENCOUNTER — Telehealth: Payer: Self-pay | Admitting: Internal Medicine

## 2014-05-05 DIAGNOSIS — Z01818 Encounter for other preprocedural examination: Secondary | ICD-10-CM | POA: Diagnosis not present

## 2014-05-05 DIAGNOSIS — J449 Chronic obstructive pulmonary disease, unspecified: Secondary | ICD-10-CM | POA: Diagnosis not present

## 2014-05-05 DIAGNOSIS — Z87891 Personal history of nicotine dependence: Secondary | ICD-10-CM | POA: Diagnosis not present

## 2014-05-05 DIAGNOSIS — Z79899 Other long term (current) drug therapy: Secondary | ICD-10-CM | POA: Diagnosis not present

## 2014-05-05 DIAGNOSIS — R05 Cough: Secondary | ICD-10-CM | POA: Diagnosis not present

## 2014-05-05 DIAGNOSIS — M069 Rheumatoid arthritis, unspecified: Secondary | ICD-10-CM | POA: Diagnosis not present

## 2014-05-05 DIAGNOSIS — G4700 Insomnia, unspecified: Secondary | ICD-10-CM | POA: Diagnosis not present

## 2014-05-05 DIAGNOSIS — E785 Hyperlipidemia, unspecified: Secondary | ICD-10-CM | POA: Diagnosis not present

## 2014-05-05 DIAGNOSIS — J439 Emphysema, unspecified: Secondary | ICD-10-CM | POA: Diagnosis not present

## 2014-05-05 DIAGNOSIS — I10 Essential (primary) hypertension: Secondary | ICD-10-CM | POA: Diagnosis not present

## 2014-05-05 DIAGNOSIS — J42 Unspecified chronic bronchitis: Secondary | ICD-10-CM | POA: Diagnosis not present

## 2014-05-05 DIAGNOSIS — E039 Hypothyroidism, unspecified: Secondary | ICD-10-CM | POA: Diagnosis not present

## 2014-05-05 DIAGNOSIS — E2740 Unspecified adrenocortical insufficiency: Secondary | ICD-10-CM | POA: Diagnosis not present

## 2014-05-05 DIAGNOSIS — Z7982 Long term (current) use of aspirin: Secondary | ICD-10-CM | POA: Diagnosis not present

## 2014-05-05 NOTE — Telephone Encounter (Signed)
Patient needing an antibiotic called to the pharmacy for a head cold per [name] at [facility] . Please call the patient after the medication has been called to the pharmacy.

## 2014-05-05 NOTE — Telephone Encounter (Signed)
CXR performed at [facility] was normal with no evidence of pneumonia.

## 2014-05-05 NOTE — Telephone Encounter (Signed)
Spoke with pts wife.  Advised of MDs message.  She verbalized understanding, further states she had already called and left a message for [name] to return her call.

## 2014-05-05 NOTE — Telephone Encounter (Signed)
Spoke with pts wife.  She states [name] wanted pt on antibiotic as a preventative measure prior to his upcoming surgery.  Please advise

## 2014-05-05 NOTE — Telephone Encounter (Signed)
His CXR was normal. His exam was unchanged from baseline yesterday. I do not see indication for antibiotic.

## 2014-05-05 NOTE — Telephone Encounter (Signed)
There is no indication for "preventative" antibiotic. I would recommend that they talk with [name] or the surgeon if they feel some "preventative" antibiotic is necessary.

## 2014-05-08 ENCOUNTER — Encounter: Payer: Self-pay | Admitting: Internal Medicine

## 2014-05-08 DIAGNOSIS — C44129 Squamous cell carcinoma of skin of left eyelid, including canthus: Secondary | ICD-10-CM | POA: Diagnosis not present

## 2014-05-08 DIAGNOSIS — C44311 Basal cell carcinoma of skin of nose: Secondary | ICD-10-CM | POA: Diagnosis not present

## 2014-05-09 ENCOUNTER — Other Ambulatory Visit: Payer: Self-pay | Admitting: *Deleted

## 2014-05-09 DIAGNOSIS — Z79899 Other long term (current) drug therapy: Secondary | ICD-10-CM | POA: Diagnosis not present

## 2014-05-09 DIAGNOSIS — C44311 Basal cell carcinoma of skin of nose: Secondary | ICD-10-CM | POA: Diagnosis not present

## 2014-05-09 DIAGNOSIS — H0289 Other specified disorders of eyelid: Secondary | ICD-10-CM | POA: Diagnosis not present

## 2014-05-09 DIAGNOSIS — M1990 Unspecified osteoarthritis, unspecified site: Secondary | ICD-10-CM | POA: Diagnosis not present

## 2014-05-09 DIAGNOSIS — Z9889 Other specified postprocedural states: Secondary | ICD-10-CM | POA: Diagnosis not present

## 2014-05-09 DIAGNOSIS — R918 Other nonspecific abnormal finding of lung field: Secondary | ICD-10-CM

## 2014-05-09 DIAGNOSIS — Z87891 Personal history of nicotine dependence: Secondary | ICD-10-CM | POA: Diagnosis not present

## 2014-05-09 DIAGNOSIS — C44129 Squamous cell carcinoma of skin of left eyelid, including canthus: Secondary | ICD-10-CM | POA: Diagnosis not present

## 2014-05-09 DIAGNOSIS — Z85828 Personal history of other malignant neoplasm of skin: Secondary | ICD-10-CM | POA: Diagnosis not present

## 2014-05-09 DIAGNOSIS — Z7982 Long term (current) use of aspirin: Secondary | ICD-10-CM | POA: Diagnosis not present

## 2014-05-09 DIAGNOSIS — H9193 Unspecified hearing loss, bilateral: Secondary | ICD-10-CM | POA: Diagnosis not present

## 2014-05-09 DIAGNOSIS — G4700 Insomnia, unspecified: Secondary | ICD-10-CM | POA: Diagnosis not present

## 2014-05-09 DIAGNOSIS — Z859 Personal history of malignant neoplasm, unspecified: Secondary | ICD-10-CM

## 2014-05-09 DIAGNOSIS — M069 Rheumatoid arthritis, unspecified: Secondary | ICD-10-CM | POA: Diagnosis not present

## 2014-05-09 DIAGNOSIS — J42 Unspecified chronic bronchitis: Secondary | ICD-10-CM | POA: Diagnosis not present

## 2014-05-09 DIAGNOSIS — D473 Essential (hemorrhagic) thrombocythemia: Secondary | ICD-10-CM | POA: Diagnosis not present

## 2014-05-12 DIAGNOSIS — R42 Dizziness and giddiness: Secondary | ICD-10-CM | POA: Diagnosis not present

## 2014-05-12 DIAGNOSIS — H6063 Unspecified chronic otitis externa, bilateral: Secondary | ICD-10-CM | POA: Diagnosis not present

## 2014-05-12 DIAGNOSIS — J019 Acute sinusitis, unspecified: Secondary | ICD-10-CM | POA: Diagnosis not present

## 2014-05-16 NOTE — Discharge Summary (Signed)
PATIENT NAME:  Johnny Sanford, Johnny Sanford MR#:  [id number] DATE OF BIRTH:  April 01, 1940  DATE OF ADMISSION:  01/12/2012 DATE OF DISCHARGE:  01/13/2012  ADDENDUM: Continuation of dictation.  DISCHARGE MEDICATIONS: 1. Xanax 0.5 mg p.o. 3 times daily as needed. 2. Omeprazole 20 mg p.o. daily. 3. Simvastatin 20 mg daily. 4. Synthroid 137 mcg p.o. daily.  5. Hydrea 500 mg two tablets on Wednesday, Thursday, Friday and Saturday. 6. Omega-3 fatty acids 1 tablet daily.  7. Vitamin B 100 mg 1 tablet daily.  8. Hydrea 500 mg four tablets Sunday, Monday and Tuesday. 9. Azithromycin 250 mg daily for three days.   CONSULTATIONS: None.   DIET: Regular diet.   DISCHARGE FOLLOW-UP: Follow up with Dr. Ronette Deter  in 1 to 2 weeks.   DISPOSITION: Discharge home with the patient's wife.   HOSPITAL COURSE: This is a 74 year old male patient with history of balance issues and element of confusion admitted on 01/12/2012 for confusion and slurred speech getting worse. The patient has been having these symptoms for 3 to 4 months and got worse on the day of admission. He was admitted for possible CVA and started on aspirin and Plavix. His CT of the head did not show any new changes. His carotid ultrasound did not show hemodynamically significant plaques. MRI of the brain did not show any new strokes. Sodium was 126 on admission. He received IV fluids and the patient's sodium improved to 128. His serum osmolality showed 265 and urine osmolality 185 and the CBC was within normal limits. EKG showed sinus rhythm with PACs. The patient felt fine the next day, in the morning, and he wanted to go home. He was able to walk. He was seen by physical therapy. They did not recommend any physical therapy needs. LDL was at goal. The patient actually takes fludrocortisone for hypotension, but at the time of discharge the patient's blood pressure was elevated to 190/105. He was very anxious. We gave him hydralazine 20 mg, one dose. That  dropped the blood pressure to 98/62, but after an hour the patient's blood pressure improved to 101/61 and heart rate 98, so we discharged the patient home. He did have some cough and congestion when he came so we gave him azithromycin. The patient was sent home in stable condition. 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 The patient's platelets here are 584. According to wife, they were in the 900s before. So he is advised to follow up with oncologist regarding his platelets.   TIME SPENT ON DISCHARGE PREPARATION: More than 30 minutes.  ____________________________ Epifanio Lesches, MD sk:sb D: 01/15/2012 22:17:44 ET T: 01/16/2012 09:59:44 ET JOB#: [id number]  cc: Epifanio Lesches, MD, <Dictator> Epifanio Lesches MD ELECTRONICALLY SIGNED 01/18/2012 13:19

## 2014-05-16 NOTE — Discharge Summary (Signed)
PATIENT NAME:  Johnny Sanford, Johnny Sanford MR#:  [id number] DATE OF BIRTH:  [birth year]  DATE OF ADMISSION:  01/12/2012 DATE OF DISCHARGE:  01/13/2012  DISCHARGE DIAGNOSES:  1. Confusion secondary to metabolic encephalopathy with hypernatremia.  2. Chronic hypotension. 3. Chronic hypernatremia.  4. Hyperlipidemia. 5. Coronary artery disease status post coronary artery bypass graft.  6. History of parotid gland cancer status post radiation. 7. History of essential thrombocytosis. 8. History of stroke. 9. History of hypothyroidism.   DISCHARGE MEDICATIONS: Fludrocortisone  0.1 mg p.o. half tablet daily.  Aspirin.  Dictation ends here. Please see addendum for continuation of dictation. ____________________________ Epifanio Lesches, MD sk:sb D: 01/15/2012 22:12:00 ET T: 01/16/2012 09:52:35 ET JOB#: [id number]  cc: Epifanio Lesches, MD, <Dictator> Epifanio Lesches MD ELECTRONICALLY SIGNED 01/18/2012 13:19

## 2014-05-19 ENCOUNTER — Ambulatory Visit
Admit: 2014-05-19 | Disposition: A | Discharge status: Home / Self Care | Payer: Self-pay | Attending: Oncology | Admitting: Oncology

## 2014-05-19 DIAGNOSIS — M069 Rheumatoid arthritis, unspecified: Secondary | ICD-10-CM | POA: Diagnosis not present

## 2014-05-19 DIAGNOSIS — Z79899 Other long term (current) drug therapy: Secondary | ICD-10-CM | POA: Diagnosis not present

## 2014-05-19 DIAGNOSIS — Z87891 Personal history of nicotine dependence: Secondary | ICD-10-CM | POA: Diagnosis not present

## 2014-05-19 DIAGNOSIS — Z87820 Personal history of traumatic brain injury: Secondary | ICD-10-CM | POA: Diagnosis not present

## 2014-05-19 DIAGNOSIS — D473 Essential (hemorrhagic) thrombocythemia: Secondary | ICD-10-CM | POA: Diagnosis not present

## 2014-05-19 LAB — CBC CANCER CENTER
BASOS PCT: 0.8 %
Basophil #: 0 x10 3/mm (ref 0.0–0.1)
Eosinophil #: 0 x10 3/mm (ref 0.0–0.7)
Eosinophil %: 0.4 %
HCT: 29.5 % — AB (ref 40.0–52.0)
HGB: 10.1 g/dL — ABNORMAL LOW (ref 13.0–18.0)
LYMPHS ABS: 0.6 x10 3/mm — AB (ref 1.0–3.6)
LYMPHS PCT: 12.8 %
MCH: 42.4 pg — ABNORMAL HIGH (ref 26.0–34.0)
MCHC: 34.4 g/dL (ref 32.0–36.0)
MCV: 123 fL — AB (ref 80–100)
Monocyte #: 0.6 x10 3/mm (ref 0.2–1.0)
Monocyte %: 11.9 %
Neutrophil #: 3.6 x10 3/mm (ref 1.4–6.5)
Neutrophil %: 74.1 %
Platelet: 644 x10 3/mm — ABNORMAL HIGH (ref 150–440)
RBC: 2.39 10*6/uL — ABNORMAL LOW (ref 4.40–5.90)
RDW: 14.5 % (ref 11.5–14.5)
WBC: 4.9 x10 3/mm (ref 3.8–10.6)

## 2014-05-19 NOTE — H&P (Signed)
PATIENT NAME:  Johnny Sanford, Johnny Sanford MR#:  [id number] DATE OF BIRTH: Jul 19, 1940  DATE OF ADMISSION: 01/12/2012  PRIMARY CARE PHYSICIAN: Dr. Ronette Deter   HISTORY OF PRESENT ILLNESS: The patient is a 74 year old Caucasian male with past medical history significant for history of small-bowel obstruction; history of thrombocytosis, on Agrylin at home; history of left parotid gland carcinoma in 1991, status post surgery, radiation, as well as chemotherapy; also history of coronary artery disease; hyperlipidemia; stroke, and left carotid surgery. He presented to the hospital with complaints of confusion, poor balance, as well as slurring of speech. According to the  patient's family as well as the patient himself, the patient has been noted to have confusion since Saturday and apparently he talked to his son and he was confused about what he needs to pick up. He was also noted to be off balance; he is not able to ambulate well and not steady on his feet since at least October. Apparently, he was treated for positional vertigo recently, however, he improved and now he is getting worse. He is not falling, however, he feels like a drunk and he was also noted to have slurring of speech. Apparently that is also intermittent occurrence; it has been going on for the past 3 or 4 weeks now. Initially, the patient's family thought it was dry mouth related, however, now they feel it could be related to something else such as stroke. That is why they brought him to the Emergency Room for further evaluation.   In the Emergency Room he, was noted to be hypertensive with systolic blood pressures around 170s for a patient who usually is hypotensive. In fact, he needs to take fludrocortisone for his hypotension. Now he is hypertensive and hospitalist services were contacted for admission.   PAST MEDICAL HISTORY: Significant for history of small-bowel obstruction, status post reduction of internal hernia; lysis of adhesions by Dr.  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    MEDICATIONS: According to medical records, the patient is on:  1.  Alprazolam 0.5 mg 3 times a day as needed.  2.  Aspirin 81 mg 2 tablets once a day.  3.  Fludrocortisone 0.05 mg once a day.  4.  Hydrea 2 g Sundays, Mondays and Tuesdays.  5.  Hydrea 1500 mg on Wednesdays, Thursdays, Fridays and Saturdays.  6.  Omega-3 polyunsaturated fatty acids 1 g once a day.  7.  Omeprazole 10 mg p.o. daily.  8.  Simvastatin 10 mg p.o. daily.  9.  Synthroid 137 mcg p.o. daily.  10.  Vitamin B complex once a day.   ALLERGIES: No known drug allergies.   FAMILY HISTORY: The patient's father died at the age of 22 of MI.  The patient's mother had  rheumatoid arthritis as well as Alzheimer dementia; she died at the age of 47.   SOCIAL HISTORY: He lives with his wife, has one adopted son.   REVIEW OF SYSTEMS:  GENERAL: Positive for fatigue and weakness for the past 1 month, history of cataracts and bifocal glasses, some sinus congestion in October for which he took at least 2 courses of antibiotics and that seemed to be improved. The patient had also inner ear infection with some positional vertigo, which was treated and that improved. He had some unsteadiness at that time on his feet, however, again it improved. Now he is having very similar  presentation; however, now he does note even to turn his head to the side, he is unsteady on his feet. Admits to some cough with whitish phlegm; no aspiration. Does not empty his bladder completely. He feels that he drips intermittently. Also, he has urge incontinence and has to go to the bathroom frequently. Otherwise, denies any fevers or chills, pains, or weight loss or gain.  EYES:  Denies blurry vision, double vision or glaucoma. Admits to cataract.  ENT: Denies any tinnitus, allergies, epistaxis, sinus pain, dentures, difficulty swallowing.  RESPIRATORY: Denies any wheezes, asthma, COPD. CARDIOVASCULAR: Denies any orthopnea, edema, arrhythmias, palpitations, or syncope. GASTROINTESTINAL:  Denies nausea, vomiting, diarrhea, or constipation.  GENITOURINARY: Denies dysuria, hematuria, frequency, incontinence.  ENDOCRINOLOGY: Denies any polydipsia, nocturia, thyroid problems, head or cold intolerance or thirst.  HEMATOLOGIC: Denies anemia, easy bruising, bleeding, swollen glands.  SKIN: Denies any acne, rashes, lesions, change in moles.  MUSCULOSKELETAL: Denies arthritis, cramps, swelling.  NEUROLOGIC: No numbness, epilepsy or tremor.  PSYCHIATRIC: Denies anxiety, insomnia or depression.   PHYSICAL EXAMINATION: VITAL SIGNS: On arrival to the hospital, temperature is 98.4, pulse 82, respiratory rate 20, blood pressure 176/94, saturation was not measured.  GENERAL: This is a well-developed, well-nourished Caucasian male in no significant distress lying on the stretcher.  HEENT: His pupils are equally round and reactive. Extraocular movements intact. No icterus or conjunctivitis. Has normal hearing. No pharyngeal erythema. Mucosa is dry.  NECK: No masses. Supple and nontender. No adenopathy. No JVD or carotid bruits bilaterally. Full range of motion.  LUNGS: Bilateral rales as well as rhonchi but no diminished breath sounds or wheezing. No labored inspirations or increased effort, dullness to  percussion. Not in overt respiratory distress. The patient intermittently coughs; however, his lungs are rattling from the distance.  CARDIOVASCULAR: S1, S2 appreciated. ______. Chest is nontender to palpation, 1+ pedal pulses.  No lower extremity edema, clubbing or cyanosis was noted.  ABDOMEN: Soft, nontender. Bowel sounds were present. No hepatosplenomegaly or masses were noted.  RECTAL: Deferred.  MUSCULOSKELETAL: Able to move all extremities. No cyanosis, degenerative joint disease, or kyphosis. Gait is not tested.  SKIN: Skin did not reveal any rashes, lesions, erythema, nodularity, or induration. It was warm and dry to palpation.  LYMPHATIC: No adenopathy in the cervical region.  NEUROLOGIC: Cranial nerves: The patient does have facial weakness in the lower part of his face on the left. The upper part of his face is intact. Normal deep tendon reflexes. Sensory is intact. No dysarthria or aphasia. The patient is alert, oriented to time, person, place; cooperative. Memory is somewhat impaired but no significant confusion, agitation or depression was noted.   LABORATORY DATA: BMP revealed a sodium of 126 as opposed to 131 in May 2013. The patient's glucose level is elevated to 104, otherwise unremarkable study. The patient's liver enzymes are normal. Cardiac enzymes, first set was negative. White blood cell count is normal at 5.5, hemoglobin was 12.2, platelet count of 625. MCV is high at 117. EKG showed sinus rhythm at 76 beats per minute, premature atrial  complexes; no acute ST-T changes were noted.   RADIOLOGIC STUDIES: CT scan of head without contrast due to dizziness reveals stable CT of head with changes of atrophy and chronic microvascular ischemic changes. No evidence of evolving infarct was noted. Chest x-ray is still pending.   ASSESSMENT AND PLAN: 1.  Altered mental status, unclear etiology at this time. I am not sure why patient has intermittent confusion as well as gait disturbance and  speech disturbance. Could be not related to Invanz as they started different times; however, CVA is of consideration versus metabolic encephalopathy.  Will admit the patient to medical floor. Will continue neuro checks. Will change aspirin to Aggrenox at this time. The patient may benefit from Plavix if the patient's carotid ultrasound does not require an intervention at this time. Will get MRI of the brain to rule out posterior infarct. Will get also ultrasound of carotids. Will also get physical therapist evaluation and will treat the hyponatremia.  2.  Hypertension, questionable anxiety related. No blood pressure medications at this time. We will follow and initiate blood pressure medications as needed.  3.   Hyperlipidemia. Get lipid panel in the morning. Will continue home medications.  4.  Peripheral vascular disease. Will get ultrasound of carotids. The patient, as mentioned above, will benefit from Plavix if no surgical intervention is needed.  5.  Acute bronchitis. Will initiate Zithromax as well as DuoNeb. Will get sputum culture and adjust antibiotics according to culture results. Will get chest x-ray repeated.  6.  Hyponatremia. Will continue the patient on IV fluids at low rate at this time. Will check osmolality of urine and serum to rule out SIADH.   TIME SPENT:  One hour.    ____________________________ Theodoro Grist, MD rv:cs D: 01/12/2012 14:14:00 ET T: 01/12/2012 14:54:39 ET JOB#: [id number]  cc: Anderson Malta A. Gilford Rile, MD Theodoro Grist, MD, <Dictator>     Dorla Guizar MD ELECTRONICALLY SIGNED 01/28/2012 20:36

## 2014-05-19 NOTE — Discharge Summary (Signed)
PATIENT NAME:  Johnny Sanford, Johnny Sanford MR#:  [id number] DATE OF BIRTH:  [birth year]  DATE OF ADMISSION:  05/23/2011 DATE OF DISCHARGE:  05/26/2012  HISTORY AND PHYSICAL: For a detailed note, please take a look at the history and physical done on admission by Dr. Lenore Manner.   DISCHARGE DIAGNOSES:  1.  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  2.  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  3.  A metabolic encephalopathy secondary to the hyponatremia and also underlying malignant hypertension, much improved.  4.  Malignant hypertension, now resolved.  5.  Hypothyroidism. 6.  Coronary artery disease.  7.  Depression.  8.  History of essential thrombocytosis.   DIET: The patient is being discharged on a low-sodium, low fat diet. The patient is being put on a fluid restriction diet at 1 liter per day.   ACTIVITY: As tolerated.   FOLLOWUP:   1.  Follow up with Dr. Ronette Deter in the next 1 to 2 weeks.  2.  Also, follow up with Dr. Juleen China from nephrology in the next 1 to 2 weeks.   DISCHARGE MEDICATIONS: Fludrocortisone 0.1 mg tablet 1/2 tab daily, omeprazole 20 mg daily, omega-3 fatty acids 1 tab daily, hydralazine 25 mg t.i.d. as needed for systolic blood pressure greater than 024, diastolic rate 097, aspirin 81 mg tablets 2 tabs daily, Symbicort 160/4.5, 2 puffs b.i.d., calcium and vitamin D 2 tabs daily, vitamin B12 1 tab daily, fluorouracil topical to be applied b.i.d. as needed, Flonase 1 spray to each nostril daily as needed, gabapentin 100 mg as needed, Synthroid 137 mcg daily, simvastatin 20 mg daily, trazodone 50 mg 1/2 tab to a tab at bedtime as needed, Celexa 10 mg daily, Anagrelide 0.5 mg tab 4 capsules, which is a  total of 2 mg b.i.d., mometasone 0.1% topical solution to be applied once daily as needed.  CONSULTANTS DURING THE HOSPITAL COURSE: Dr. Anthonette Legato from nephrology.    PERTINENT STUDIES DONE DURING THE HOSPITAL COURSE:  A CT of the head done without contrast on admission showing no acute intracranial process. A CT of the chest done with contrast on 04/28 showing multiple indeterminate subcentimeter nodules in the left lower lobe, largest of which are 9 mm, nonspecific. Follow-up noncontrast CT chest is recommended in 3 months. Mild biapical subpleural opacity secondary to scarring.  Borderline enlarged precarinal lymph nodes. Ground-glass nodular opacity in the right upper lobe.   HOSPITAL COURSE: This is a 74 year old male with medical problems as mentioned above, presented to the hospital on 05/22/2012 secondary to altered mental status. He also was noted to be hyponatremic and noted to have significant elevated blood pressures.   1.  Metabolic encephalopathy:  This is the likely cause of the patient's mental status change.  The encephalopathy was likely secondary to hyponatremia complicated with underlying malignant hypertension. After his sodium has been corrected and his blood pressures have improved, his mental status is now back down to baseline. His CT head on admission was negative.  2.  Malignant hypertension. The patient has had blood pressures that have been kind of fluctuating in the hospital although have significantly improved. He was initially started on some antihypertensives and some p.r.n. hydralazine, although those have been held as he is now normotensive. The patient presently  continue his hydralazine as needed for blood pressure, if needed. He normally runs a low blood pressure, therefore, presently he is not being started on any scheduled antihypertensives at  this time.  3.  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 The patient was started on conivaptan while in the hospital, and a nephrology consult was obtained.  With the conivaptan, the patient's sodium has  significantly improved now, come up from 121 to 130.  As mentioned, his  mental status is much improved. 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  The nodules further need to be followed. Since the patient sees Dr. Grayland Ormond for his essential thrombocytosis. He will continue to follow the patient regarding these nodules and repeat a CT chest in the next 3 months or so. The patient is being placed on a 1 liter fluid restricted diet and will follow-up with nephrology with Dr. Juleen China as an outpatient regarding his hyponatremia.  4.  Hypothyroidism:  The patient was maintained on his Synthroid. He will resume that. 5.  History of coronary artery disease: The patient had no chest pain, no acute issues.  We will continue his aspirin and statin.  6.  Hyperlipidemia:  The patient will continue his simvastatin as stated.  7.  History of essential thrombocytosis:  The patient will continue followup with Dr. Grayland Ormond as an outpatient.  CODE STATUS: The patient is a FULL CODE.   TIME SPENT WITH DISCHARGE: 40 minutes.   ____________________________ Belia Heman. Verdell Carmine, MD vjs:cb D: 05/26/2012 16:32:18 ET T: 05/26/2012 17:06:47 ET JOB#: [id number]  cc: Belia Heman. Verdell Carmine, MD, <Dictator> Eduard Clos. Gilford Rile, MD Mamie Levers, MD Henreitta Leber MD ELECTRONICALLY SIGNED 06/01/2012 12:49

## 2014-05-19 NOTE — Consult Note (Signed)
PATIENT NAME:  Johnny Sanford, Johnny Sanford MR#:  [id number] DATE OF BIRTH:  [birth year]  NEPHROLOGY CONSULTATION  DATE OF CONSULTATION:  05/24/2012  REQUESTING PHYSICIAN: Dr. Dustin Flock.  CONSULTING PHYSICIAN:  Mantaj Chamberlin Lilian Kapur, MD  REASON FOR CONSULTATION: Persistent hyponatremia.   HISTORY OF PRESENT ILLNESS: The patient is a pleasant 74 year old Caucasian male with a past medical history of hypertension, essential thrombocytosis, followed by Dr. 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 He presented to [facility] on 05/22/2012. His blood pressure was quite high, with a systolic blood pressure greater than 200 upon presentation. He was administered IV antihypertensives and had a reduction in blood pressure. We are now consulted for the evaluation and management of hyponatremia. It appears that the patient's hyponatremia has been chronic in nature. Upon presentation, serum sodium was as low as 122. His baseline sodium appears to be between 128 to 131. He is regularly followed by Dr. Grayland Ormond for essential thrombocytosis. He was recently placed on anagrelide for this. The patient has had some laboratory studies performed to further explore his hyponatremia. Serum uric acid was quite low at 1.8, which could potentially suggest underlying SIADH. Urine sodium was 106. Urine osmolality was 552.   The patient has a history of tobacco abuse, though he quit approximately 11 years ago. The patient appears to be rather euvolemic at this point in time. The patient did not appear to be on any diuretics at home.   PAST MEDICAL HISTORY: 1.  Hypertension, essential thrombocytosis,  hyperlipidemia, coronary disease status post CABG.  2.  History of CVA.  3.  Carotid stenosis.  4.  GERD.  5.  Chronic hyponatremia.  6.  Overactive bladder.  7.  Left parotid gland cancer, status post surgical intervention and radiation therapy.  8.  Rheumatoid arthritis.   PAST SURGICAL HISTORY:  1.  Right inguinal hernia repair.  2.  Left carotid endarterectomy.  3.  Left parotid gland cancer surgery.  4.  History of throat surgery.  5.  Coronary artery bypass grafting.   ALLERGIES: No known drug allergies.   CURRENT INPATIENT MEDICATIONS:  Include:   1.  Anagrelide 2 mg p.o. b.i.d. 2. Aspirin 162 mg p.o. daily.  3.  Symbicort 1 mL/4.5, 2 puffs inhaled b.i.d. 4. Celexa 10 mg p.o. daily.  5. Cyancobalamin 1000 mcg p.o. daily.  6. Florinef 0.1 mg p.o. daily.  7.  Gabapentin 100 mg p.o. daily.  8.  Levothyroxine 0.137 mg p.o. q. 6 a.m. 9.  Lovaza 1 gram p.o. daily.  10. Omeprazole 20 mg p.o. q. 6 a.m.  11.  Zocor 20 mg p.o. at bedtime.  12. Trazodone 25 mg p.o. at bedtime.   FAMILY HISTORY: The patient's mother is deceased, and died secondary to Alzheimer's. The patient's father died secondary to a myocardial infarction.   SOCIAL HISTORY: The patient is married and lives with his wife. He used to work for the [employer] in [employer] [place] [employer] He has a history of tobacco abuse but quit smoking 11 years ago. Denies alcohol or illicit drug use.   REVIEW OF SYSTEMS:  CONSTITUTIONAL: Denies fevers or chills. Unclear about weight loss.  EYES: Denies diplopia, blurry vision.  HEENT: Denies headaches, hearing loss.  Denies epistaxis.   CARDIOVASCULAR: Currently denies chest pain, palpitations, PND.  RESPIRATORY: Denies cough or shortness of breath. Does have some sputum production. Denies hemoptysis.  GASTROINTESTINAL: Denies nausea, vomiting, dysgeusia.  GENITOURINARY: Denies frequency, urgency, or dysuria.  MUSCULOSKELETAL: Denies joint pain, swelling, or  redness.  INTEGUMENTARY: Denies skin rashes or lesions.  NEUROLOGIC: Denies focal extremity weakness or numbness.  PSYCHIATRIC: The patient is with history of depression.  ENDOCRINE: Denies polyuria or polydipsia.  HEMATOLOGIC AND LYMPHATIC: Has a history of thrombocytosis.  ALLERGY/IMMUNOLOGIC: Denies seasonal allergies or history of immunodeficiency.   PHYSICAL EXAMINATION: VITAL SIGNS: Temperature 97.2, pulse 88, respirations 22, blood pressure 84/50, pulse ox  93% on room air.  GENERAL: Reveals a well-developed, well-nourished Caucasian male who appears his stated age, currently in no acute distress.  HEENT: Normocephalic, atraumatic. Extraocular movements are intact. Pupils equal, round, and reactive to light. No scleral icterus. Conjunctivae are pink. No epistaxis noted. Gross hearing intact. Oral mucosa are moist.  NECK: Supple, without JVD or lymphadenopathy.  LUNGS: Demonstrate scattered rhonchi, with normal respiratory effort.  CARDIOVASCULAR: S1, S2, regular rate and rhythm. No murmurs, rubs, or gallops appreciated.  ABDOMEN: Soft, nontender, nondistended. Bowel sounds positive. No rebound or guarding. No gross organomegaly appreciated.  EXTREMITIES: No clubbing, cyanosis, or edema.  NEUROLOGIC: The patient is alert and oriented to time, person, and place. Strength is 5/5 in both upper and lower extremities.  MUSCULOSKELETAL: No joint redness, swelling, or tenderness appreciated.  GU: No suprapubic tenderness is noted at this time.  SKIN: Is warm and dry. No rashes noted.  PSYCHIATRIC: The patient has an appropriate affect and appears to have good insight into his current illness.   LABORATORY DATA: Sodium 122, potassium 3.4, chloride 86, CO2 27, BUN 8, creatinine 0.5, glucose 102, blood osmolality is 242, uric acid 1.8.   Thyroid stimulating hormone is 1.3. Urine osmolality is 565.   Urinalysis shows a urine protein of 30 mg/dL, 1 RBC per high-powered field, 1 WBC per  high-powered field.   CT head without contrast shows no acute intracranial process.   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 Nephrology consulted for hyponatremia.   PROBLEM LIST: 1.  Acute exacerbation of chronic hyponatremia.  2.  Essential thrombocytosis.  3.  Hypokalemia; serum potassium 3.4.  4.  Hypothyroidism.   PLAN: The patient presents with a very interesting case. He appears to have chronic hyponatremia in reviewing labs at [facility]. His baseline sodium, however, appears to be between 128 to 131. Clinically, he appears euvolemic at this point in time. We highly suspect SIADH. He had a high urine osmolality, and he also had a low serum uric acid level. Both of these point to SIADH as the cause of his hyponatremia. The patient has a history of tobacco abuse and likely has an element of underlying COPD. In addition, we need to consider the possibility of underlying lung mass.   We will proceed with a CT scan of the chest to evaluate for potential underlying lung mass. For now, we will start the patient on conivaptan 20 mg IV bolus, followed by  20 mg infused continuously over 24 hours. We will follow serum sodiums q. 4 hours for now. We will anticipate a goal sodium of 130 by 4 p.m. tomorrow.   I have also discussed the case with Dr. Grayland Ormond, and at times leukemias present with essential thrombocytosis. Additionally, the patient could now have more a  difficult-to-control thrombocytosis as a reactive process to another potential underlying malignancy.    Further plan to be determined as patient progresses.   I would like to thank Dr. Dustin Flock for this kind referral.    ____________________________ Tama High, MD mnl:dm D: 05/24/2012 16:18:05 ET T: 05/24/2012 16:31:45 ET JOB#: [id number]  cc: Tama High, MD, <Dictator> Mariah Milling Ramir Malerba MD ELECTRONICALLY SIGNED 05/28/2012 21:05

## 2014-05-19 NOTE — H&P (Signed)
PATIENT NAME:  Johnny Sanford, Johnny Sanford MR#:  [id number] DATE OF BIRTH:  [birth year]  DATE OF ADMISSION:  05/22/2012  PRIMARY CARE PHYSICIAN: Anderson Malta A. Gilford Rile, MD  REFERRING PHYSICIAN: Shirley Friar. Braud, MD  CHIEF COMPLAINT: Uncontrolled hypertension, headache and mild mental status change.   HISTORY OF PRESENT ILLNESS: The patient is a 74 year old Caucasian male with history of systemic hypertension, history of essential thrombocytosis, peripheral arterial disease, hypothyroidism, coronary artery disease and hyperlipidemia. The patient was in his usual state of health until the last couple of days. He noticed that his blood pressure is uncontrolled and getting higher despite his efforts to control blood pressure medication. He also developed some headache, and there was on-and-off mild confusion or mental status change; however, by examining the patient at the Emergency Department, mentally, he looks fine. It seems there was some changing of medications done by his primary care physician in the last few days. His alprazolam was stopped 3 days ago. Citalopram was added. Anyway, his blood pressure at home was reaching 200/100. Here, his blood pressure was 213/110. Additionally, his sodium was low at 121. His baseline sodium before was 128. He has a history of syndrome of inappropriate ADH secretion. The patient had workup here including CAT scan of the head which showed no acute changes. The patient was admitted for 24-hour observation to control blood pressure and follow up on his mental status change and also his sodium issue.   REVIEW OF SYSTEMS:  CONSTITUTIONAL: Denies any fever. No chills. No fatigue.  EYES: No blurring of vision. No double vision.  ENT: No hearing impairment. No sore throat. No dysphagia.  CARDIOVASCULAR: No chest pain. No shortness of breath.  RESPIRATORY: No shortness of breath. No cough. No chest pain.  GASTROINTESTINAL: No abdominal pain. No vomiting. No diarrhea.  GENITOURINARY:  No dysuria, no frequency of urination, no hematuria, although the wife reported that she notes that he went to the bathroom for urination a little more than usual.  PSYCHIATRY: No anxiety now. No depression.  ENDOCRINE: No polyuria or polydipsia. No heat or cold intolerance.   PAST MEDICAL HISTORY:  1. Systemic hypertension.  2. Essential thrombocytosis.  3. Hypercholesterolemia. 4. Coronary artery disease status post coronary artery bypass graft. 5. History of stroke.  6. Carotid stenosis.  7. Gastroesophageal reflux disease.  8. 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  9. History of squamous cell carcinoma of the left side of the neck, invaded the throat area, and that was operated.  10. History of overactive bladder.  11. History of left parotid gland cancer status post surgical intervention and radiation therapy. 12. Rheumatoid arthritis.   PAST SURGICAL HISTORY:  1. Coronary artery bypass graft.  2. Throat surgery for squamous cell carcinoma. 3. Left parotid gland cancer surgery and radiation therapy. 4. Carotid endarterectomy on the left side.  5. Right inguinal hernia repair.  6. Surgery for bowel obstruction.  FAMILY HISTORY: His father died at the age of 39 after having heart attack. His mother died at the age of 61. She had Alzheimer's. He had a sister who died from liver cancer with metastasis to the lung and other organs.   SOCIAL HABITS: Nonsmoker. He has a history of smoking in the past, but he quit 11 years ago. No history of alcohol abuse.   SOCIAL HISTORY: He is married, living with his wife. He retired from working with the [employer]:  1. Anagrelide 0.5 mg capsule taking 4 capsules, that  is 2 mg, twice a day.  2. Aspirin 81 mg taking 2 tablets, that is 162 mg, once a day.  3. Budesonide with formoterol 160/4.5 two puffs twice a day.  4. Calcium with vitamin D 2 tablets once a day. 5. Citalopram 10 mg a day.  6.  Fludrocortisone 0.1 mg 1/2-tablet once a day.  7. Fluorouracil topical 5% p.r.n. for his skin.  9. Fluticasone nasal steroid spray once a day.  10. Gabapentin 100 mg once a day.  11. Hydralazine 25 mg 3 times a day. This is taken has p.r.n. if systolic above 737. 12. Levothyroxine 137 mcg once a day.  13. Omega-3 fatty acids 1000 mg once a day.  14. Omeprazole 20 mg a day.  15. Simvastatin 20 mg a day.  16. Trazodone 50 mg taking 1/2-tablet at night p.r.n. 17. Vitamin B12 1 tablet a day p.o.   ALLERGIES: No known drug allergies.   PHYSICAL EXAMINATION:  VITAL SIGNS: Blood pressure was 213/110, subsequent blood pressure was dropping gradually until finally it is now 132/88. Pulse 86, respiratory rate 18, oxygen saturation 96%.  GENERAL APPEARANCE: Elderly male lying in bed in no acute distress.  HEAD: No pallor. No icterus. No cyanosis. Ear examination revealed normal hearing, no discharge, no lesions. Nasal examination revealed no bleeding, no discharge, no ulcers. Oropharyngeal area showed no oral thrush, no ulcers. Eye examination revealed normal iris and conjunctivae. Pupils about 5 mm, equal and reactive to light.  NECK: Supple. Trachea at midline. No masses.  HEART: Normal S1, S2. No S3, S4. No murmur. No gallop. No carotid bruits.  RESPIRATORY: Revealed normal breathing pattern without use of accessory muscles. No rales, no wheezing.  ABDOMEN: Soft without tenderness. No hepatosplenomegaly. No masses. No hernias.  SKIN: Revealed no ulcers except for actinic keratosis.  MUSCULOSKELETAL: No joint swelling. No clubbing.  NEUROLOGIC: Cranial nerves II through XII are intact. No focal motor deficit.  PSYCHIATRY: The patient is alert and oriented to place and people. He is calm and cooperative. His wife is at the bedside, and she feels that he is appropriate.   LABORATORY FINDINGS AND RADIOLOGIC DATA: CAT scan of the head showed no acute intracranial process. EKG showed normal sinus  rhythm; otherwise, no ischemic abnormalities. Premature atrial complex. Serum glucose 101, BUN 8, creatinine 0.4, sodium 121. Again, his baseline sodium was 128 in December 2013. Potassium is 3.9. Troponin less than 0.02. CBC showed white count of 8000, hemoglobin 12, hematocrit 35, platelet count 695. Prior to that, earlier this year, his platelet count was reaching 950. His MCV is 107. Urinalysis was unremarkable.   ASSESSMENT:  1. Malignant hypertension. This gradually improved after giving some hydralazine.  2. Minimal mental status change. This had improved. The patient is now appropriate.  3. Hyponatremia. This is secondary to syndrome of inappropriate antidiuretic hormone  secretion. His sodium was a little bit lower than his baseline. I would like to mention that he had workup with that in December 2013. His serum osmolality was 265, whereas his urine osmolality was 183.  4. Hypothyroidism. We need to check his TSH.  5. Coronary artery disease.  6. Peripheral arterial disease.  7. His other medical problems are as listed above.   PLAN: Will admit the patient for observation, monitor blood pressure. I will keep hydralazine orally 3 times a day rather than p.r.n. I will hold fludrocortisone to avoid further escalation of blood pressure. I will check his TSH to make sure that hypothyroidism is not  additional cause to drop his sodium. I will give him gentle IV hydration with normal saline. Restrict water intake to less than a liter a day. Continue the rest of his home medications. Neuro checks q.2 hours and then less frequently.   CODE STATUS: The patient and his wife indicates that he has a living will and that his code status is full code unless he has irreversible condition, then he does not want to be maintained on life support indefinitely.   Time Spent in evaluating this patient and reviewing his medical records took more than 1 hour.    ____________________________ Clovis Pu. Lenore Manner,  MD amd:OSi D: 05/22/2012 04:17:14 ET T: 05/22/2012 06:59:47 ET JOB#: [id number]  cc: Clovis Pu. Lenore Manner, MD, <Dictator> Mike Craze Irven Coe MD ELECTRONICALLY SIGNED 06/20/2012 23:30

## 2014-05-20 NOTE — H&P (Signed)
PATIENT NAME:  Johnny Sanford, Johnny Sanford MR#:  [id number] DATE OF BIRTH:  24-Apr-1940  DATE OF ADMISSION:  06/04/2013  PRIMARY CARE PHYSICIAN: Anderson Malta A. Gilford Rile, MD  PRIMARY NEPHROLOGIST: Dr. Abigail Butts CHIEF COMPLAINT: Confusion.   HISTORY OF PRESENT ILLNESS: This is a very pleasant 74 year old male who was seen yesterday actually in the ER for confusion and was noted to have sodium of 124 with weakness but was sent home. Wife brought in back in because his confusion worsened. He is nauseated, vomiting. His sodium level is now 119. He was actually admitted at the end of April 2014 for hyponatremia. At that time, they did have to use conivaptan drip. The etiology of his hyponatremia is unclear at this time.   REVIEW OF SYSTEMS:  CONSTITUTIONAL: The patient does seem confused, but no fevers. He has weakness and fatigue.  EYES: No blurred or double vision.  ENT: Positive hearing loss. No post nasal drip, snoring, discharge, epistaxis. RESPIRATORY: Positive cough. No painful respirations, wheezing, hemoptysis. He was recently treated with antibiotics for congestion.  CARDIOVASCULAR: No chest pressure, no orthopnea, edema, arrhythmia, dyspnea on exertion, palpitations, syncope. GASTROINTESTINAL: Positive nausea and vomiting. No diarrhea, abdominal pain, melena, or ulcers.  GENITOURINARY: No dysuria or hematuria.  ENDOCRINE: No polyuria or polydipsia. HEMATOLOGIC/LYMPHATIC: Positive easy bruising. SKIN: No rash or lesions.  MUSCULOSKELETAL: He has rheumatoid arthritis.  NEUROLOGIC: Positive history of CVA. PSYCHIATRIC: No history of anxiety or depression.   PAST MEDICAL HISTORY: 1. Systemic hypertension.  2. Essential thrombocytosis.  3. The patient has a history of hyponatremia; was here in April 2014, being followed by Dr. Silverio Lay.  4. Hypothyroidism.  5. Multiple subcentimeter left lower lobe lung nodules.  6. Carotid artery stenosis.  7. CVA.  8. Coronary artery disease status post CABG.   9. Hyperlipidemia.  10. Squamous cell carcinoma of the left side of the neck causing throat invasion.  11. Overactive bladder.  12. Left parotid gland cancer.  13. Rheumatoid arthritis.   SOCIAL HISTORY: The patient lives with his wife. No tobacco, alcohol or IV drug use.   ALLERGIES: No known drug allergies.   SURGICAL HISTORY: 1. Coronary artery bypass.  2. Throat surgery for squamous cell carcinoma. 3. Left parotid gland cancer status post radiation therapy and surgery.  4. CEA on the left side.  5. Right inguinal hernia repair.  6. Surgery for bowel resection.   FAMILY HISTORY: Positive for CAD and Alzheimer's dementia.   MEDICATIONS: 1. Vitamin B12 1 tablet daily.  2. Trazodone 50 mg 1/2 tablet to 1 tablet at bedtime p.r.n.  3. Simvastatin 20 mg daily.  4. Omeprazole 20 mg daily.  5. Synthroid 137 mcg daily.  6. Iron plus B daily.  7. Hydrea 500 mg 5 tablets once a day for 1 week, alternating with 4 tablets 1 week.  8. Hydralazine 25 mg t.i.d.  9. Fluticasone.  10. Fludrocortisone 0.1 mg one-half tablet daily.  11. Citalopram 1 tablet b.i.d. This is to be discontinued by the primary care physician as per the wife.  12. Budesonide/formoterol 2 puffs 2 times a day.  13. Aspirin 81 mg daily.  14. Xanax 0.5 mg t.i.d. p.r.n.   PHYSICAL EXAMINATION: VITAL SIGNS: Temperature 98.5, pulse 89, respirations 20, blood pressure 134/88, and 95% on room air.  GENERAL: The patient is alert. He is confused to time, not place or name.  HEENT: Head is atraumatic, normocephalic. Pupils are reactive. Sclerae anicteric. Mucous membranes moist. Oropharynx is clear.  NECK: Supple without JVD, carotid bruit,  or enlarged thyroid. CARDIOVASCULAR: Regular rate and rhythm. He has a 2/6 systolic ejection murmur heard best at the left sternal border without radiation. PMI is not displaced.  LUNGS: No respiratory distress. He has some rhonchi in the left base. No wheezing or rales heard. Normal  chest expansion. ABDOMEN: Bowel sounds are positive. Nontender, nondistended. No hepatosplenomegaly.  SKIN: Without rash or lesions. EXTREMITIES: No clubbing, cyanosis or edema.  NEUROLOGIC: Cranial nerves II through XII are grossly intact. There are no focal deficits. His strength is 4/5 symmetrically.   LABORATORY DATA: Sodium 119, potassium 3.5, chloride 84, bicarbonate 28, BUN 18, creatinine 0.77, glucose 128, calcium 8.5, bilirubin 0.9, alkaline phosphatase 54, ALT 18, AST 28, total protein 7.4. Albumin is 4, lipase 122. Urinalysis was performed yesterday, which was negative. White blood cells 2.2, hemoglobin 10.3, hematocrit 30, platelets are 183,000. Troponin less than 0.02. EKG is normal sinus rhythm. Chest x-ray this time is pending. CT of the head was performed yesterday when he was in the ER, which shows no acute intracranial abnormalities.   ASSESSMENT AND PLAN: A 74 year old male who presents with confusion and found to have significant hyponatremia.  1. Hyponatremia. This could be suspected from adrenal insufficiency. It does appear that patient is on fludrocortisone as per the medical chart. I will go ahead and order a cortisol level. I have spoken with Dr. Holley Raring, who recommended hydration with IV fluids and if this does not work, then he will have to use conivaptan drip. For now, I have ordered some urine sodium studies as well as urine osmolality and uric acid.  2. Confusion secondary to hyponatremia. The CT of the head was negative yesterday. I am not going to repeat this.  3. History of essential thrombocytosis. We will monitor platelet count.  4. Hypothyroidism. Will continue Synthroid.  5. History of rheumatoid arthritis. It is unclear if the patient is taking any medications this time.  6. History of depression. As per the wife, Dr. Gilford Rile had started citalopram. This is now being discontinued, so will not restart this.  7. The patient is a DO NOT RESUSCITATE status.    CRITICAL CARE TIME SPENT:  65 minutes.    ____________________________ Donell Beers. Benjie Karvonen, MD spm:lm D: 06/04/2013 19:38:50 ET T: 06/04/2013 21:53:15 ET JOB#: [id number]  cc: Shirely Toren P. Benjie Karvonen, MD, <Dictator> Eduard Clos. Gilford Rile, MD [place] Lilian Kapur, MD  Donell Beers Basim Bartnik MD ELECTRONICALLY SIGNED 06/05/2013 14:17

## 2014-05-20 NOTE — Discharge Summary (Signed)
PATIENT NAME:  Johnny Sanford, Johnny Sanford MR#:  [id number] DATE OF BIRTH:  06/26/1940  DATE OF ADMISSION:  06/04/2013 DATE OF DISCHARGE:  06/07/2013  Addendum  CT scan was done prior to discharge for evaluation of previous pulmonary nodules in the setting of the patient continuing to have hyponatremia and the possibility of looking into SIADH secondary to lung cancer. Overall, the CT scan showed stable apical pleural scarring, mild emphysematous changes. There has been interval resolution of the ground-glass density, right upper lobe, and near-complete resolution of the pulmonary nodules in the left upper lobe. A 4 mm residual of the previous 9 mm nodule in the left lung base. The small bilateral effusions have resolved. Some other scarring seen and atelectasis in the lung bases. Recommendation to repeat the scan in 6 to 12 months. The constellation of findings likely reflects the sequela of resolving interstitial pneumonitis. He also has bilateral renal cysts. No need to proceed with any other followup test at this moment.   ____________________________ [place] Sink, MD rsg:lb D: 06/08/2013 07:00:27 ET T: 06/08/2013 08:02:40 ET JOB#: [id number]  cc: [place] Sink, MD, <Dictator> Sharen Youngren America Brown MD ELECTRONICALLY SIGNED 06/17/2013 22:14

## 2014-05-20 NOTE — Discharge Summary (Signed)
PATIENT NAME:  Johnny Sanford, Johnny Sanford MR#:  [id number] DATE OF BIRTH:  1940-11-06  DATE OF ADMISSION:  06/04/2013 DATE OF DISCHARGE: 06/07/2013.   REASON FOR ADMISSION: Altered mental status, confusion, which severe hyponatremia.   DISCHARGE DIAGNOSES: 1. 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  2. Hypertension.  3. Essential thrombocytosis.  4. Possible adrenal insufficiency.  5. Hypothyroidism.  6. Subcentimeter  multiple left lower lung nodes. 7.  Carotid artery stenosis.  8. Cerebrovascular accident.  9. Coronary artery disease, status post coronary artery bypass grafting.  10. Hyperlipidemia.  11. 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  12. Overreactive  bladder.  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  14. Rheumatoid arthritis.  15. Hypokalemia.   MEDICATIONS AT DISCHARGE:  1.  Fludrocortisone 0.1 mg take 1/2 tablet once a day.  2.  Omeprazole 20 mg once daily.  3.  Hydralazine 25 mg up to 3 times a day as needed for systolic blood pressure elevation above 160/100.  4.  Aspirin 81 mg take 2 tablets once daily.  5.  Budesonide with formoterol 160/4.5 mg 2 puffs twice daily.  6.  Vitamin B12 one once daily. 7.  Fluorouracil topical 5% cream 2 times a day as needed.  8.  Fluticasone nasal spray as needed for rhinitis. 9.  Levothyroxine 137 mcg once daily.  10. Simvastatin 20 mg daily.  11. Trazodone 50 mg take 1/2 tablet to 1 tablet as needed for sleeping.  12. Mometasone topical cream applied to affected areas on the face.  13. Iron 100 mg plus vitamin B complex and vitamin C with folic acid once a day.  14. Citalopram 1 p.o. twice daily.  15. Hydroxyurea 500 mg take 5 caps once a day alternating with 4 caps once a day on the following week.  16. Klonopin 0.5 mg 2 times a day.  17. Levofloxacin 500 mg every 24 hours to be stopped today.  18. Potassium chloride 10 mEq once daily. Stop this medication  if at any point the patient is to stop taking fludrocortisone.   DISCHARGE FOLLOW-UP: Dr. Juleen China next Monday for sodium recheck. Follow-up primary care physician, Dr. Ronette Deter in the next 1 to 2 weeks. Follow-up with Dr. Grayland Ormond,  hematology and oncology in the next 2 to 4 weeks.   IMPORTANT RESULTS: CT scan of the head did not show any significant active disease or acute abnormalities. Admissions sodium on 06/04/2013, 119. His sodium has been increasing constantly up to 132 today, on tolvaptan. Potassium 3.3, replacement done. Creatinine 0.7. White blood count was 2.1 and it ranges in between 2 to 4.5, hemoglobin 9.2, platelet count 148. 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 The patient has been previously diagnosed with adrenal insufficiency, but how the diagnosis was made was not clear. The patient came last week with some confusion and feeling dehydrated. His sodium was 124, the patient was discharged home. At home, continued to have significant issues and started having significant vomiting, got more dry, more confused, and whenever he was re-evaluated in the Emergency Department his sodium level was 119. The patient was admitted to the hospital for treatment and correction of this hyponatremia.   As per problems:  1. Hyponatremia. The patient has a combination of SIADH, possible adrenal insufficiency, in the past has been told that he does. He is on fludrocortisone. Apparently, whenever he stopped  the fludrocortisone his potassium level increases significantly. Right now his potassium has been on the low side and he has been repleted. The patient looked euvolemic. He was put on tolvaptan  and his sodium level increased gradually at the rate of 1 mEq per hour and his sodium is 132 at discharge. The patient is not due to continue tolvaptan outpatient unless his sodium continues to decrease. He will  have a follow up with Dr. Juleen China to evaluate this. As far as the cause of the hyponatremia, again we are not quite sure if this is really adrenal insufficiency. Seems more like a SIADH for what he is going to be on fluid restriction, but on top of that, since he had a CT scan before showing some subcentimeter pulmonary nodules, up to 0.9 mm, we are going to have a stat CT scan before we discharge him to make sure that he is not having any new lesions. The patient agreeable with this and if there is any significant findings we will have to arrange to follow up with Dr. Grayland Ormond. Discussed the case with Dr. Holley Raring at length.   2. As far as his confusion was secondary to metabolic encephalopathy due to the hyponatremia. CT scan was negative. The patient had some clearance of his mental status as his sodium level was  increasing. After it reached 125 the patient was pretty much back to his normal state.   3. As far as his rheumatoid arthritis. The patient is doing okay, not taking any specific medications other than occasional pain medication.   4. As far as his depression. The patient is being started on citalopram and it was discontinued due to no results. Continue trazodone and p.r.n. Klonopin. The patient is going to be discharged after the CT scan done unless any significant major changes.  We will update the results of the CT scan.   I spent about 45 minutes so far with this patient.   ____________________________ Rutland Sink, MD rsg:sg D: 06/07/2013 12:31:03 ET T: 06/07/2013 13:08:36 ET JOB#: [id number]  cc:  Sink, MD, <Dictator> Saul Dorsi America Brown MD ELECTRONICALLY SIGNED 06/17/2013 22:13

## 2014-05-22 ENCOUNTER — Other Ambulatory Visit: Payer: Self-pay

## 2014-05-22 DIAGNOSIS — I959 Hypotension, unspecified: Secondary | ICD-10-CM | POA: Diagnosis not present

## 2014-05-22 DIAGNOSIS — E871 Hypo-osmolality and hyponatremia: Secondary | ICD-10-CM | POA: Diagnosis not present

## 2014-05-22 DIAGNOSIS — F411 Generalized anxiety disorder: Secondary | ICD-10-CM

## 2014-05-22 DIAGNOSIS — E876 Hypokalemia: Secondary | ICD-10-CM | POA: Diagnosis not present

## 2014-05-22 LAB — BASIC METABOLIC PANEL
BUN: 17 mg/dL (ref 4–21)
CREATININE: 0.6 mg/dL (ref 0.6–1.3)
Glucose: 93 mg/dL
Potassium: 4.4 mmol/L (ref 3.4–5.3)
SODIUM: 130 mmol/L — AB (ref 137–147)

## 2014-05-22 MED ORDER — ESCITALOPRAM OXALATE 20 MG PO TABS: 20.0000 mg | ORAL_TABLET | Freq: Every day | ORAL | Status: DC

## 2014-05-23 ENCOUNTER — Encounter: Payer: Self-pay | Admitting: *Deleted

## 2014-05-25 ENCOUNTER — Ambulatory Visit: Payer: Medicare Other

## 2014-05-25 ENCOUNTER — Ambulatory Visit: Payer: Medicare Other | Admitting: Internal Medicine

## 2014-06-05 DIAGNOSIS — Z85828 Personal history of other malignant neoplasm of skin: Secondary | ICD-10-CM | POA: Diagnosis not present

## 2014-06-05 DIAGNOSIS — L570 Actinic keratosis: Secondary | ICD-10-CM | POA: Diagnosis not present

## 2014-06-12 ENCOUNTER — Telehealth: Payer: Self-pay | Admitting: *Deleted

## 2014-06-12 ENCOUNTER — Other Ambulatory Visit (INDEPENDENT_AMBULATORY_CARE_PROVIDER_SITE_OTHER): Payer: Medicare Other

## 2014-06-12 DIAGNOSIS — E871 Hypo-osmolality and hyponatremia: Secondary | ICD-10-CM | POA: Diagnosis not present

## 2014-06-12 LAB — BASIC METABOLIC PANEL
BUN: 19 mg/dL (ref 6–23)
CHLORIDE: 89 meq/L — AB (ref 96–112)
CO2: 34 mEq/L — ABNORMAL HIGH (ref 19–32)
Calcium: 8.9 mg/dL (ref 8.4–10.5)
Creatinine, Ser: 0.79 mg/dL (ref 0.40–1.50)
GFR: 102.21 mL/min (ref >60.00)
Glucose, Bld: 96 mg/dL (ref 70–99)
Potassium: 4.6 mEq/L (ref 3.5–5.1)
Sodium: 127 mEq/L — ABNORMAL LOW (ref 135–145)

## 2014-06-12 NOTE — Telephone Encounter (Signed)
We can have him come in for BMP today for hyponatremia

## 2014-06-12 NOTE — Telephone Encounter (Signed)
Please see below request, pt has an appt scheduled for June 2nd

## 2014-06-12 NOTE — Telephone Encounter (Signed)
no

## 2014-06-12 NOTE — Telephone Encounter (Signed)
Pt wife would like a add testosterone

## 2014-06-13 ENCOUNTER — Ambulatory Visit: Payer: Medicare Other | Admitting: Internal Medicine

## 2014-06-20 DIAGNOSIS — R634 Abnormal weight loss: Secondary | ICD-10-CM | POA: Diagnosis not present

## 2014-06-20 DIAGNOSIS — I10 Essential (primary) hypertension: Secondary | ICD-10-CM | POA: Diagnosis not present

## 2014-06-20 DIAGNOSIS — E271 Primary adrenocortical insufficiency: Secondary | ICD-10-CM | POA: Diagnosis not present

## 2014-06-20 DIAGNOSIS — E871 Hypo-osmolality and hyponatremia: Secondary | ICD-10-CM | POA: Diagnosis not present

## 2014-06-21 DIAGNOSIS — R634 Abnormal weight loss: Secondary | ICD-10-CM | POA: Diagnosis not present

## 2014-06-21 DIAGNOSIS — E538 Deficiency of other specified B group vitamins: Secondary | ICD-10-CM | POA: Diagnosis not present

## 2014-06-21 DIAGNOSIS — E871 Hypo-osmolality and hyponatremia: Secondary | ICD-10-CM | POA: Diagnosis not present

## 2014-06-22 DIAGNOSIS — E271 Primary adrenocortical insufficiency: Secondary | ICD-10-CM | POA: Diagnosis not present

## 2014-06-22 DIAGNOSIS — E871 Hypo-osmolality and hyponatremia: Secondary | ICD-10-CM | POA: Diagnosis not present

## 2014-06-22 DIAGNOSIS — R29898 Other symptoms and signs involving the musculoskeletal system: Secondary | ICD-10-CM | POA: Diagnosis not present

## 2014-06-27 ENCOUNTER — Ambulatory Visit: Payer: Medicare Other

## 2014-06-27 ENCOUNTER — Ambulatory Visit: Payer: Medicare Other | Admitting: Internal Medicine

## 2014-06-29 ENCOUNTER — Encounter: Payer: Self-pay | Admitting: Internal Medicine

## 2014-06-29 ENCOUNTER — Ambulatory Visit (INDEPENDENT_AMBULATORY_CARE_PROVIDER_SITE_OTHER): Payer: Medicare Other | Admitting: Internal Medicine

## 2014-06-29 VITALS — BP 132/83 | HR 84 | Temp 98.5°F | Ht 65.0 in | Wt 158.4 lb

## 2014-06-29 DIAGNOSIS — R5382 Chronic fatigue, unspecified: Secondary | ICD-10-CM

## 2014-06-29 DIAGNOSIS — R296 Repeated falls: Secondary | ICD-10-CM | POA: Diagnosis not present

## 2014-06-29 DIAGNOSIS — R531 Weakness: Secondary | ICD-10-CM

## 2014-06-29 DIAGNOSIS — E871 Hypo-osmolality and hyponatremia: Secondary | ICD-10-CM | POA: Diagnosis not present

## 2014-06-29 NOTE — Assessment & Plan Note (Signed)
Generalized weakness and weight loss, likely multifactorial. Recent labs performed by Dr. Eddie Dibbles were normal. Neurology evaluation pending. Will follow up after neuro evaluation.

## 2014-06-29 NOTE — Progress Notes (Signed)
Subjective:    Patient ID: Johnny Sanford, male    DOB: 1940-09-10, 74 y.o.   MRN: [id number]  HPI  [name] presents for follow up.  Last seen 4/7 for bronchitis.  Seen by Endocrinologist 5/26. Additional lab evaluation was normal. Plans to see Dr. Melrose Nakayama on 6/15 for neurologic evaluation.   Continues to feel "off balance" at times. Feel out of bed on Saturday, but no injury. Some sinus headaches. Cough has improved with Breo. Follow up with Dr. Stevenson Clinch is pending on Tuesday.  Has follow up with Dr. Grayland Ormond in July 2016.    Wt Readings from Last 3 Encounters:  06/29/14 158 lb 6 oz (71.838 kg)  05/04/14 160 lb (72.576 kg)  04/11/14 161 lb (73.029 kg)    Past medical, surgical, family and social history per today's encounter.  Review of Systems  Constitutional: Positive for fatigue and unexpected weight change. Negative for fever, chills, activity change and appetite change.  Eyes: Negative for visual disturbance.  Respiratory: Positive for cough. Negative for shortness of breath.   Cardiovascular: Negative for chest pain, palpitations and leg swelling.  Gastrointestinal: Negative for nausea, vomiting, abdominal pain, diarrhea, constipation and abdominal distention.  Genitourinary: Negative for dysuria, urgency and difficulty urinating.  Musculoskeletal: Positive for myalgias and arthralgias. Negative for gait problem.  Skin: Negative for color change and rash.  Neurological: Positive for weakness. Negative for dizziness, light-headedness, numbness and headaches.  Hematological: Negative for adenopathy.  Psychiatric/Behavioral: Negative for sleep disturbance and dysphoric mood. The patient is not nervous/anxious.        Objective:    BP 132/83 mmHg  Pulse 84  Temp(Src) 98.5 F (36.9 C) (Oral)  Ht 5\' 5"  (1.651 m)  Wt 158 lb 6 oz (71.838 kg)  BMI 26.35 kg/m2 Physical Exam  Constitutional: He is oriented to person, place, and time. He appears well-developed and  well-nourished. No distress.  HENT:  Head: Normocephalic and atraumatic.  Right Ear: External ear normal.  Left Ear: External ear normal.  Nose: Nose normal.  Mouth/Throat: Oropharynx is clear and moist. No oropharyngeal exudate.  Eyes: Conjunctivae and EOM are normal. Pupils are equal, round, and reactive to light. Right eye exhibits no discharge. Left eye exhibits no discharge. No scleral icterus.  Neck: Normal range of motion. Neck supple. No tracheal deviation present. No thyromegaly present.  Cardiovascular: Normal rate, regular rhythm and normal heart sounds.  Exam reveals no gallop and no friction rub.   No murmur heard. Pulmonary/Chest: Effort normal and breath sounds normal. No accessory muscle usage. No tachypnea. No respiratory distress. He has no decreased breath sounds. He has no wheezes. He has no rhonchi. He has no rales. He exhibits no tenderness.  Musculoskeletal: Normal range of motion. He exhibits no edema.  Lymphadenopathy:    He has no cervical adenopathy.  Neurological: He is alert and oriented to person, place, and time. No cranial nerve deficit. Coordination normal.  Skin: Skin is warm and dry. No rash noted. He is not diaphoretic. No erythema. No pallor.  Psychiatric: He has a normal mood and affect. His behavior is normal. Judgment and thought content normal.          Assessment & Plan:   Problem List Items Addressed This Visit      Unprioritized   Fatigue    Chronic fatigue, with adrenal insufficiency, labile hypertension and essential thrombocythemia. Relatively stable. Will continue to monitor.      Hyponatremia    Recent Na was normal  at 130. Continue Florinef.      Recurrent falls    Recurrent falls. Recommended PT for balance training, however pt declines. Recent ENDO evaluation normal. Neurology evaluation pending.      Weakness generalized - Primary    Generalized weakness and weight loss, likely multifactorial. Recent labs performed by Dr.  Eddie Dibbles were normal. Neurology evaluation pending. Will follow up after neuro evaluation.          Return in about 3 months (around 09/29/2014) for Recheck.

## 2014-06-29 NOTE — Patient Instructions (Signed)
Follow up with Dr. Melrose Nakayama as scheduled for neurology evaluation

## 2014-06-29 NOTE — Progress Notes (Signed)
Pre visit review using our clinic review tool, if applicable. No additional management support is needed unless otherwise documented below in the visit note. 

## 2014-06-29 NOTE — Assessment & Plan Note (Signed)
Recurrent falls. Recommended PT for balance training, however pt declines. Recent ENDO evaluation normal. Neurology evaluation pending.

## 2014-06-29 NOTE — Assessment & Plan Note (Signed)
Chronic fatigue, with adrenal insufficiency, labile hypertension and essential thrombocythemia. Relatively stable. Will continue to monitor.

## 2014-06-29 NOTE — Assessment & Plan Note (Signed)
Recent Na was normal at 130. Continue Florinef.

## 2014-07-03 DIAGNOSIS — C07 Malignant neoplasm of parotid gland: Secondary | ICD-10-CM | POA: Diagnosis not present

## 2014-07-03 DIAGNOSIS — H6063 Unspecified chronic otitis externa, bilateral: Secondary | ICD-10-CM | POA: Diagnosis not present

## 2014-07-04 ENCOUNTER — Ambulatory Visit (INDEPENDENT_AMBULATORY_CARE_PROVIDER_SITE_OTHER): Payer: Medicare Other | Admitting: Internal Medicine

## 2014-07-04 ENCOUNTER — Encounter: Payer: Self-pay | Admitting: Internal Medicine

## 2014-07-04 VITALS — BP 122/72 | HR 82 | Ht 65.0 in | Wt 155.0 lb

## 2014-07-04 DIAGNOSIS — R05 Cough: Secondary | ICD-10-CM | POA: Diagnosis not present

## 2014-07-04 DIAGNOSIS — J411 Mucopurulent chronic bronchitis: Secondary | ICD-10-CM

## 2014-07-04 DIAGNOSIS — R059 Cough, unspecified: Secondary | ICD-10-CM

## 2014-07-04 LAB — PULMONARY FUNCTION TEST
DL/VA % pred: 97 %
DL/VA: 4.13 ml/min/mmHg/L
DLCO UNC: 18.97 ml/min/mmHg
DLCO unc % pred: 74 %
FEF 25-75 PRE: 1.1 L/s
FEF 25-75 Post: 1.05 L/sec
FEF2575-%CHANGE-POST: -5 %
FEF2575-%Pred-Post: 55 %
FEF2575-%Pred-Pre: 58 %
FEV1-%CHANGE-POST: -2 %
FEV1-%PRED-POST: 68 %
FEV1-%Pred-Pre: 69 %
FEV1-POST: 1.73 L
FEV1-PRE: 1.77 L
FEV1FVC-%Change-Post: -1 %
FEV1FVC-%Pred-Pre: 92 %
FEV6-%Change-Post: -1 %
FEV6-%PRED-POST: 78 %
FEV6-%Pred-Pre: 79 %
FEV6-POST: 2.56 L
FEV6-PRE: 2.62 L
FEV6FVC-%Change-Post: 0 %
FEV6FVC-%Pred-Post: 106 %
FEV6FVC-%Pred-Pre: 107 %
FVC-%Change-Post: -1 %
FVC-%Pred-Post: 73 %
FVC-%Pred-Pre: 75 %
FVC-Post: 2.59 L
FVC-Pre: 2.63 L
PRE FEV1/FVC RATIO: 67 %
Post FEV1/FVC ratio: 67 %
Post FEV6/FVC ratio: 99 %
Pre FEV6/FVC Ratio: 100 %
RV % pred: 160 %
RV: 3.6 L
TLC % pred: 103 %
TLC: 6.26 L

## 2014-07-04 NOTE — Patient Instructions (Signed)
Follow up with Dr. Stevenson Clinch 3 months - cont with [facility] and as needed Albuterol - cont with exercise as tolerated.  - please avoid second hand smoke exposure.

## 2014-07-04 NOTE — Progress Notes (Signed)
PFT performed today. 

## 2014-07-04 NOTE — Progress Notes (Signed)
SMW performed today. 

## 2014-07-04 NOTE — Progress Notes (Signed)
MRN# [id number] Johnny Sanford 1940-05-08   CC: Chief Complaint  Patient presents with  . Follow-up    nodules, cough, PFT and smw results;      Brief History: HPI 03/2013 She is a pleasant 74 year old male presenting today with above chief complaint. 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 Patient states that he's had a mildly productive intermittent cough over the past 3 weeks, he recently had a upper spray tract infection and was treated with Augmentin by his primary care physician. He had a chest x-ray that showed a possible left lower lobe opacity, he endorses losing about 50 pounds in the last 1 year. And he does endorse some subjective night sweats, along with a hoarse voice. He denies dyspnea on exertion or dyspnea at rest. He admits to smoking on and off for a total of about 7 years, he last quit in 2003, he has had significant secondhand smoke exposure from his wife for 45 years.  Plan - Breo, CT Chest, PFTs, 6MWT, avoid 2nd hand smoke exposure   Events since last clinic visit: She presents today for follow-up visit of his COPD, along with his wife. Since last visit he's had a CT scan of his chest, abdomen and pelvis, see results stated below. Current he also has had increase in the number of falls, will be following up with neurology. He had his surgery done to his eyes for squamous cell carcinoma of the left lower eyelid and a Mohs procedure of his left nasal bridge for basal cell carcinoma. Off note, white stated that after his Mohs procedure he had to replace on oxygen due to desaturating down to the 70s. Further history today reveals that he also has essential thrombocytosis he is on hydroxyurea, his white blood cell count for the last year has been in the mid twos.  Line overall patient states that he is doing fairly okay, he has a cane today for walking, he also had his pulmonary function tests and 6 minute walk test done today.     Medication:   Current Outpatient Rx  Name  Route  Sig  Dispense  Refill  . albuterol (PROVENTIL HFA;VENTOLIN HFA) 108 (90 BASE) MCG/ACT inhaler   Inhalation   Inhale 2 puffs into the lungs every 6 (six) hours as needed for wheezing or shortness of breath.   1 Inhaler   0   . aspirin 81 MG EC tablet   Oral   Take 162 mg by mouth daily.          . clonazePAM (KLONOPIN) 0.5 MG tablet   Oral   Take 1 tablet (0.5 mg total) by mouth 3 (three) times daily as needed for anxiety.   90 tablet   3   . Cyanocobalamin (VITAMIN B-12 PO)   Oral   Take by mouth daily.         Marland Kitchen escitalopram (LEXAPRO) 20 MG tablet   Oral   Take 1 tablet (20 mg total) by mouth daily.   30 tablet   2   . fludrocortisone (FLORINEF) 0.1 MG tablet   Oral   Take 1 tablet (0.1 mg total) by mouth daily. Patient taking differently: Take 0.05 mg by mouth daily.    30 tablet   5   . fluorouracil (EFUDEX) 5 % cream   Topical  Apply topically as needed.   40 g   1   . fluticasone (FLONASE) 50 MCG/ACT nasal spray   Each Nare   Place 1 spray into both nostrils daily as needed for rhinitis.   16 g   6   . Fluticasone Furoate-Vilanterol (BREO ELLIPTA) 200-25 MCG/INH AEPB   Inhalation   Inhale 200 mcg into the lungs daily.   1 each   6   . hydrALAZINE (APRESOLINE) 25 MG tablet   Oral   Take 25 mg by mouth 3 (three) times daily. Use as needed for BP >160/100         . hydroxyurea (HYDREA) 500 MG capsule      Take 3 capsules daily. May take with food to minimize GI side effects.         Marland Kitchen levothyroxine (SYNTHROID, LEVOTHROID) 137 MCG tablet   Oral   Take 1 tablet (137 mcg total) by mouth daily.   90 tablet   3   . meclizine (ANTIVERT) 25 MG tablet   Oral   Take 25 mg by mouth 2 (two) times daily as needed.           . mometasone (ELOCON) 0.1 % lotion      as needed.          . OMEGA-3 1000 MG CAPS   Oral   Take 1 capsule by mouth daily.           Marland Kitchen omeprazole (PRILOSEC) 20 MG capsule   Oral   Take 1 capsule (20 mg total) by mouth daily.   90 capsule   3   . traMADol (ULTRAM) 50 MG tablet   Oral   Take 1 tablet (50 mg total) by mouth every 8 (eight) hours as needed.   90 tablet   3   . traZODone (DESYREL) 50 MG tablet   Oral   Take 0.5-1 tablets (25-50 mg total) by mouth at bedtime as needed for sleep.   30 tablet   2      Review of Systems: Gen:  Denies  fever, sweats, chills HEENT: Denies blurred vision, double vision, ear pain, eye pain, hearing loss, nose bleeds, sore throat Cvc:  No dizziness, chest pain or heaviness Resp:   Admits to: Dry cough, shortness of breath which is chronic Gi: Denies swallowing difficulty, stomach pain, nausea or vomiting, diarrhea, constipation, bowel incontinence Gu:  Denies bladder incontinence, burning urine Ext:   No Joint pain, stiffness or swelling Skin: No skin rash, easy bruising or bleeding or hives Endoc:  No polyuria, polydipsia , polyphagia or weight change Other:  All other systems negative  Allergies:  Simvastatin  Physical Examination:  VS: BP 122/72 mmHg  Pulse 82  Ht 5\' 5"  (1.651 m)  Wt 155 lb (70.308 kg)  BMI 25.79 kg/m2  SpO2 98%  General Appearance: No distress  HEENT: PERRLA, no ptosis, no other lesions noticed Pulmonary: Decreased breath sounds at the bilateral lower bases, coarse upper airway sounds, fair respiratory effort. Cardiovascular:  Normal S1,S2.  No m/r/g.     Abdomen:Exam: Benign, Soft, non-tender, No masses  Skin:   warm, no rashes, no ecchymosis  Extremities: normal, no cyanosis, clubbing, warm with normal capillary refill.      Rad results: (The following images and results were reviewed by Dr. Stevenson Clinch). CT scan of chest 04/11/2014 Biapical nodules noted, favor scarring. This measures 10 mm no  right apex and 13 mm in the left apex. These are stable since  prior study. Tiny subpleural nodules in the left lower lobe and left upper lobe, 3 mm or less in diameter. These were not definitively seen on prior study. No confluent airspace opacities. No pleural effusions. Heart size is normal. Aorta is normal in caliber. His coronary artery calcifications. Scattered aortic calcifications. No mediastinal, hilar or axillary adenopathy. Chest wall soft tissues are unremarkable. Impression: Tiny 3 mm or less subpleural left lung nodules, less likely not visible on plain phlegm. These are nonspecific. Given the location, most likely postinflammatory. If needed these could be follow-up with repeat CT in 12 months. Scarring in the apices, stable since prior study. Heavy calcified coronary arteries.    Pulmonary function testing 07/04/2014 FVC 75% FEV169% FEV1/FVC 67% FVC 75% RV 160% TLC 103% RV/TLC 155% DLCO uncorrected 74% Impression: Moderate obstruction with no stomach and response to bronchodilation is. Mild decrease in DLCO, which can be seen in individuals who used tobacco or secondhand smoke exposure.  6 minute walk test: 136 m/511 feet, lowest saturation 88%, highest heart rate 98. No: Recent falls and unsteady gait, using a cane at times to walk, stop the last 35 seconds.  Assessment and Plan: A 74-year-old male past medical history of COPD, mental cell lymphoma, essential tremor cytosis, seen in follow-up visit for COPD optimization. COPD (chronic obstructive pulmonary disease) Patient with clinical history significant for second hand smoke exposure along with a mild smoking history in the past. His PFTs today shows moderate obstruction. I have advised him to continue using Breo and as need albuterol. Still has exposure to secondhand smoke from his wife. I have also counseled his wife on effects of secondhand smoke and its more toxic effects to the patient. CT chest with biapical nodules  favor scarring, 10 mm in the right apex and 13 mm in the left apex. May follow these in 6 months to 12 months, with a repeat CT. Wife is also stated today that BREO stem about $45 out of pocket, they can afford this currently; however she is not sure how long she can afford $45 a month for a bronchodilation, I have advised her dyspnea becomes more of an issue that we can try Brovana nebulizers; patient did not qualify for Breo assistance due to having medicare.   I suspect that he has recurrent bronchitis/URI due to chronic exposure to secondhand smoke but also due to having a low immune system from being on hydroxyurea, for essential thrombocytosis. A review of his chart showed that his white cell count over the last year has been about 2-2.5.  Plan: - cont with Breo - cont with albuterol rescue inhaler - Avoid secondhand smoke exposure       Updated Medication List Outpatient Encounter Prescriptions as of 07/04/2014  Medication Sig  . albuterol (PROVENTIL HFA;VENTOLIN HFA) 108 (90 BASE) MCG/ACT inhaler Inhale 2 puffs into the lungs every 6 (six) hours as needed for wheezing or shortness of breath.  Marland Kitchen aspirin 81 MG EC tablet Take 162 mg by mouth daily.   . clonazePAM (KLONOPIN) 0.5 MG tablet Take 1 tablet (0.5 mg total) by mouth 3 (three) times daily as needed for anxiety.  . Cyanocobalamin (VITAMIN B-12 PO) Take by mouth daily.  Marland Kitchen escitalopram (LEXAPRO) 20 MG tablet Take 1 tablet (20 mg total) by mouth daily.  . fludrocortisone (FLORINEF) 0.1 MG tablet Take 1 tablet (0.1 mg total) by mouth daily. (Patient taking differently: Take 0.05 mg by mouth daily. )  . fluorouracil (EFUDEX) 5 % cream Apply topically  as needed.  . fluticasone (FLONASE) 50 MCG/ACT nasal spray Place 1 spray into both nostrils daily as needed for rhinitis.  . Fluticasone Furoate-Vilanterol (BREO ELLIPTA) 200-25 MCG/INH AEPB Inhale 200 mcg into the lungs daily.  . hydrALAZINE (APRESOLINE) 25 MG tablet Take 25 mg by mouth  3 (three) times daily. Use as needed for BP >160/100  . hydroxyurea (HYDREA) 500 MG capsule Take 3 capsules daily. May take with food to minimize GI side effects.  Marland Kitchen levothyroxine (SYNTHROID, LEVOTHROID) 137 MCG tablet Take 1 tablet (137 mcg total) by mouth daily.  . meclizine (ANTIVERT) 25 MG tablet Take 25 mg by mouth 2 (two) times daily as needed.   . mometasone (ELOCON) 0.1 % lotion as needed.   . OMEGA-3 1000 MG CAPS Take 1 capsule by mouth daily.    Marland Kitchen omeprazole (PRILOSEC) 20 MG capsule Take 1 capsule (20 mg total) by mouth daily.  . traMADol (ULTRAM) 50 MG tablet Take 1 tablet (50 mg total) by mouth every 8 (eight) hours as needed.  . traZODone (DESYREL) 50 MG tablet Take 0.5-1 tablets (25-50 mg total) by mouth at bedtime as needed for sleep.   No facility-administered encounter medications on file as of 07/04/2014.    Orders for this visit: No orders of the defined types were placed in this encounter.    Thank  you for the visitation and for allowing  [facility] to assist in the care of your patient. Our recommendations are noted above.  Please contact us if we can be of further service.  Vilinda Boehringer, MD [facility] Office Number: [phone]

## 2014-07-04 NOTE — Assessment & Plan Note (Addendum)
Patient with clinical history significant for second hand smoke exposure along with a mild smoking history in the past. His PFTs today shows moderate obstruction. I have advised him to continue using Breo and as need albuterol. Still has exposure to secondhand smoke from his wife. I have also counseled his wife on effects of secondhand smoke and its more toxic effects to the patient. CT chest with biapical nodules favor scarring, 10 mm in the right apex and 13 mm in the left apex. May follow these in 6 months to 12 months, with a repeat CT. Wife is also stated today that BREO stem about $45 out of pocket, they can afford this currently; however she is not sure how long she can afford $45 a month for a bronchodilation, I have advised her dyspnea becomes more of an issue that we can try Brovana nebulizers; patient did not qualify for Breo assistance due to having medicare.   I suspect that he has recurrent bronchitis/URI due to chronic exposure to secondhand smoke but also due to having a low immune system from being on hydroxyurea, for essential thrombocytosis. A review of his chart showed that his white cell count over the last year has been about 2-2.5.  Plan: - cont with Breo - cont with albuterol rescue inhaler - Avoid secondhand smoke exposure

## 2014-07-12 DIAGNOSIS — R634 Abnormal weight loss: Secondary | ICD-10-CM | POA: Diagnosis not present

## 2014-07-12 DIAGNOSIS — R531 Weakness: Secondary | ICD-10-CM | POA: Diagnosis not present

## 2014-07-12 DIAGNOSIS — I10 Essential (primary) hypertension: Secondary | ICD-10-CM | POA: Diagnosis not present

## 2014-07-19 ENCOUNTER — Ambulatory Visit: Payer: Medicare Other | Attending: Neurology | Admitting: Physical Therapy

## 2014-07-19 ENCOUNTER — Encounter: Payer: Self-pay | Admitting: Physical Therapy

## 2014-07-19 DIAGNOSIS — R269 Unspecified abnormalities of gait and mobility: Secondary | ICD-10-CM | POA: Insufficient documentation

## 2014-07-19 DIAGNOSIS — R531 Weakness: Secondary | ICD-10-CM | POA: Insufficient documentation

## 2014-07-19 NOTE — Patient Instructions (Signed)
Roll   Inhale and bring shoulders up, back, then exhale and relax shoulders down. Repeat ___ times. Do ___ times per day.  Copyright  VHI. All rights reserved.  Flexibility: Neck Retraction   Pull head straight back, keeping eyes and jaw level. Repeat ____ times per set. Do ____ sets per session. Do ____ sessions per day.  http://orth.exer.us/344   Copyright  VHI. All rights reserved.    SIT TO STAND: No Device   Sit with feet shoulder-width apart, on floor.(Make sure that you are in a chair that won't move like a chair against a wall or couch etc) Lean chest forward, raise hips up from surface. Straighten hips and knees. Weight bear equally on left and right sides. 10___ reps per set, _2__ sets per day, _5__ days per week Place left leg closer to sitting surface.  Copyright  VHI. All rights reserved.  Backward Walking   Walk backward, toes of each foot coming down first. Take long, even strides. Make sure you have a clear pathway with no obstructions when you do this. Stand beside counter and walk backward  And then walk forward doing opposite directions; repeat 10 laps 2x a day at least 5 days a week.  Copyright  VHI. All rights reserved.  Tandem Walking   Stand beside kitchen sink and place one foot in front of the other, lift your hand and try to hold position for 10 sec. Repeat with other foot in front; Repeat 5 reps with each foot in front 5 days a week.Balance: Unilateral

## 2014-07-19 NOTE — Therapy (Signed)
[place] MAIN [facility] SERVICES [place], [place], [address] Phone: [phone]   Fax:  [phone]  Physical Therapy Evaluation  Patient Details  Name: Johnny Sanford MRN: [id number] Date of Birth: 21-Jan-1941 Referring Provider:  Anabel Bene, MD  Encounter Date: 07/19/2014      PT End of Session - 07/19/14 1602    Visit Number 1   Number of Visits 10   Date for PT Re-Evaluation 08/18/14   Authorization Type Gcodes 1   Authorization Time Period 10   PT Start Time 1430   PT Stop Time 1535   PT Time Calculation (min) 65 min   Equipment Utilized During Treatment Gait belt   Activity Tolerance Patient tolerated treatment well;Patient limited by fatigue   Behavior During Therapy [facility] for tasks assessed/performed      Past Medical History  Diagnosis Date  . HLD (hyperlipidemia)   . HTN (hypertension)   . GERD (gastroesophageal reflux disease)   . Hypothyroidism   . 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 CVA (cerebral vascular accident)   . DA (degenerative arthritis)   . RA (rheumatoid arthritis)     Past Surgical History  Procedure Laterality Date  . Carotid endarterectomy    . Skin lesion excision      Dr. Sarajane Jews, and Dr. Vickki Muff at [facility]    There were no vitals filed for this visit.  Visit Diagnosis:  Abnormality of gait - Plan: PT plan of care cert/re-cert  Decreased strength - Plan: PT plan of care cert/re-cert      Subjective Assessment - 07/19/14 1443    Subjective Referred by doctor Melrose Nakayama due to decreased balance and muscle weakness. Gradual loss of strength for the the past 2 year, s/p drug interaction leading to decreased sodium in the blood stream causing hospitalization. Pt is required to reduce fluid  intake (currently max 2L per day). Pt reports that he is going to the gym 2 times a week to increase UE strength and improve endurance; states that he has not been doing any exercises to improve balance.  NCS and EMG scheduled for July 2016.     Pertinent History Cancer, 1991. CVA, 2003. rheumatiod arthitis.    Limitations Walking;Lifting;Standing   How long can you stand comfortably? 25 minutes. depending on the day due to leg weakness.    How long can you walk comfortably? 2 miles.    Patient Stated Goals improve balance and increase LE strength    Currently in Pain? No/denies            [facility] PT Assessment - 07/19/14 1449    Assessment   Medical Diagnosis Decrease mobility, abnormality of gait    Onset Date/Surgical Date 07/18/12   Next MD Visit 08/09/2014    Prior Therapy yes , 7 years prior; no recent therapy for this condition;   Precautions   Precautions Fall   Balance Screen   Has the patient fallen in the past 6 months Yes   How many times? 2  multiple near falls.    Has the patient had a decrease in activity level because of a fear of falling?  Yes   Is the patient reluctant to leave their home because of a fear of falling?  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    Cognition   Overall Cognitive Status Within Functional Limits for tasks assessed   Observation/Other Assessments   Skin Integrity some abrasions noted on Bilateral UE, distal to elbow, reports fall 2 weeks prior.    Activities of Balance Confidence Scale (ABC Scale)  46%,    Sensation   Light Touch Appears Intact  during evaluation   Additional Comments reports occassional numbness and  tingling in L hand. in the AM    Posture/Postural Control   Posture Comments significant forward head posture and increased thoracic kyphosis, as well as decreased lumbar lordosis in stance.    AROM   Overall AROM  Within functional limits for tasks performed   Overall AROM Comments some increase in pain in the L shoulder with IR to reach low back and increased posterior R shoudler pain with horizontal abdution with reach to contralateral posterior shoulder.   Strength   Overall Strength Deficits   Overall Strength Comments R UE grossly 4+/5. L UE grossly 4/5. Bilateral LE 5/5 throughout.    Ambulation/Gait   Gait Comments Patient ambulates with SPC, decreased step length,decreased foot clearance bilaterally, narrow base of support, flexed posture, slower gait speed, supervision   Static Standing Balance   Static Standing - Comment/# of Minutes Patient able to stand with feet close together, CGA with mild sway   Standardized Balance Assessment   Five times sit to stand comments  15.06.(without HHA) >15 seconds indicates increased fall risk.    10 Meter Walk 0.92 m/s. (with SPC)< 1.17m/s indicates need for intervention to reduce fall riskl   [employer] Test   Sit to Stand Able to stand without using hands and stabilize independently   Standing Unsupported Able to stand 2 minutes with supervision   Sitting with Back Unsupported but Feet Supported on Floor or Stool Able to sit safely and securely 2 minutes   Stand to Sit Sits safely with minimal use of hands   Transfers Able to transfer safely, minor use of hands   Standing Unsupported with Eyes Closed Able to stand 10 seconds with supervision   Standing Ubsupported with Feet Together Able to place feet together independently but unable to hold for 30 seconds   From Standing, Reach Forward with Outstretched Arm Can reach forward >5 cm safely (2")   From Standing Position, Pick up Object from Floor Able to pick up shoe, needs supervision    From Standing Position, Turn to Look Behind Over each Shoulder Turn sideways only but maintains balance   Turn 360 Degrees Able to turn 360 degrees safely but slowly   Standing Unsupported, Alternately Place Feet on Step/Stool Able to complete >2 steps/needs minimal assist   Standing Unsupported, One Foot in Front Able to take small step independently and hold 30 seconds   Standing on One Leg Unable to try or needs assist to prevent fall   Total Score 36   Timed Up and Go Test   Normal TUG (seconds) 11.95  <13seconds indicates lower fall risk    TUG Comments <13seconds indicates lower fall risk  PT Education - 07/19/14 1601    Education provided Yes   Education Details HEP for balance and to correct posture. Plan of care.    Person(s) Educated Patient;Spouse   Methods Explanation;Demonstration;Tactile cues;Verbal cues   Comprehension Verbalized understanding;Returned demonstration;Verbal cues required             PT Long Term Goals - 07/19/14 1612    PT LONG TERM GOAL #1   Title Increase BERG balance scale score by >10 points to indicate reduced fall risk. 09/13/2014   Baseline 36   Time 8   Period Weeks   Status New   PT LONG TERM GOAL #2   Title Increase ABC score to >65% to indicate a greater confidence with ADLs and iADLs 09/13/2014   Baseline 46   Time 8   Period Weeks   Status New   PT LONG TERM GOAL #3   Title Improve gait speed to greater than 1.63m/s to allow pt to have greater access to community with decreased risk of falls. 09/13/2014   Baseline 0.92   Time 8   Period Weeks   Status New   PT LONG TERM GOAL #4   Title Patient will be independent in home exercise program to improve strength/mobility/balance for better functional independence with ADLs by 09/13/2014   Time 8   Period Weeks   Status New   PT LONG TERM GOAL #5   Title Pt will increase L UE strength to 4+/5 in order to faciliate increased ability to  complete house hold activities with great independence. 09/13/2014   Baseline 4/5   Time 8   Period Weeks   Status New               Plan - 07/19/14 1603    Clinical Impression Statement Pt reports that he has experienced a decrease in strength and balance associated with gradual unexplained weight loss that began 2 years prior. Through PT eval, pt demonstrates decreased strength in L UE compared to R, as well as decreased balance and increased fall risk evidenced by the BERG balance scale, 10 meter walk test, and ABC. Patient is currently going to a community gym and working with a Physiological scientist on LE and UE strengthening. Plan to focus skilled PT intervention on balance and mobility training. Pt would benefit from skilled PT in order to increase balance, strength and improve gait in order to increase indepedence with mobility through the community.    Pt will benefit from skilled therapeutic intervention in order to improve on the following deficits Abnormal gait;Decreased endurance;Decreased mobility;Decreased balance;Difficulty walking;Decreased strength;Impaired flexibility   Rehab Potential Good   Clinical Impairments Affecting Rehab Potential Positive: good familty support, motivated to get better. Negative: co-morbidities, and    PT Frequency Other (comment)  2 times a week for 1 week and then 1 time a week for rest of plan of care   PT Duration 8 weeks   PT Treatment/Interventions Moist Heat;Cryotherapy;Gait training;Stair training;Functional mobility training;Therapeutic activities;Therapeutic exercise;Balance training;Neuromuscular re-education;Patient/family education;Manual techniques   PT Next Visit Plan balance exercises and increase HEP   PT Home Exercise Plan see patient instructions.    Consulted and Agree with Plan of Care Patient;Family member/caregiver          G-Codes - 08/16/14 1717    Functional Assessment Tool Used Berg Balance, 10 meter, 5 times  sit<>Stand, TUG, ABC simple   Functional Limitation Mobility: Walking and moving around   Mobility: Walking and Moving Around Current Status (  [id number]) At least 40 percent but less than 60 percent impaired, limited or restricted   Mobility: Walking and Moving Around Goal Status [phone]) At least 20 percent but less than 40 percent impaired, limited or restricted       Problem List Patient Active Problem List   Diagnosis Date Noted  . Recurrent falls 06/29/2014  . Allergic rhinitis 05/04/2014  . 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 CAD (coronary artery disease) 04/13/2014  . Multiple lung nodules on CT 04/13/2014  . Atherosclerosis of aorta 04/13/2014  . Cough 03/31/2014  . Weight loss 03/31/2014  . Medicare annual wellness visit, subsequent 12/09/2013  . Skin lesion 06/24/2013  . Anxiety state 05/10/2013  . Weakness generalized 05/10/2013  . Chronic neck pain 11/24/2012  . Abnormal CT scan, chest 06/03/2012  . Labile hypertension 05/20/2012  . Memory loss 03/31/2012  . Adrenal insufficiency 01/20/2012  . Hyponatremia 12/24/2011  . Fatigue 10/28/2011  . Essential thrombocythemia 06/13/2011  . Low back pain 05/19/2011  . Cardiomegaly 04/02/2011  . COPD (chronic obstructive pulmonary disease) 04/02/2011  . Rheumatoid arthritis(714.0) 03/11/2011  . Insomnia 02/17/2011  . Arthralgia 02/17/2011  . PAD (peripheral artery disease) 08/30/2009  . Carotid artery stenosis 06/10/2009  . Hyperlipidemia 06/08/2009    Barrie Folk SPT 07/20/2014   5:21 PM  This entire session was performed under direct supervision and direction of a licensed therapist . I have personally read, edited and approve of the note as written.  Hopkins,Margaret 07/20/2014, 5:21 PM  [place] MAIN [facility] SERVICES [address] [place], [place], [address] Phone: [phone]   Fax:  [phone]

## 2014-07-20 ENCOUNTER — Encounter: Payer: Self-pay | Admitting: Physical Therapy

## 2014-07-28 ENCOUNTER — Ambulatory Visit: Payer: Medicare Other | Attending: Neurology | Admitting: Physical Therapy

## 2014-07-28 ENCOUNTER — Encounter: Payer: Self-pay | Admitting: Physical Therapy

## 2014-07-28 DIAGNOSIS — R531 Weakness: Secondary | ICD-10-CM

## 2014-07-28 DIAGNOSIS — M6281 Muscle weakness (generalized): Secondary | ICD-10-CM | POA: Diagnosis not present

## 2014-07-28 DIAGNOSIS — R269 Unspecified abnormalities of gait and mobility: Secondary | ICD-10-CM | POA: Diagnosis not present

## 2014-07-28 NOTE — Therapy (Signed)
[place] MAIN [facility] SERVICES [place], [place], [address] Phone: [phone]   Fax:  [phone]  Physical Therapy Treatment  Patient Details  Name: Johnny Sanford MRN: [id number] Date of Birth: 1940/06/11 Referring Provider:  Jackolyn Confer, MD  Encounter Date: 07/28/2014      PT End of Session - 07/28/14 1146    Visit Number 2   Number of Visits 10   Date for PT Re-Evaluation 08/18/14   Authorization Type Gcodes 2   Authorization Time Period 10   PT Start Time 1047   PT Stop Time 1139   PT Time Calculation (min) 52 min   Equipment Utilized During Treatment Gait belt   Activity Tolerance Patient tolerated treatment well   Behavior During Therapy [facility] for tasks assessed/performed      Past Medical History  Diagnosis Date  . HLD (hyperlipidemia)   . HTN (hypertension)   . GERD (gastroesophageal reflux disease)   . Hypothyroidism   . 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 CVA (cerebral vascular accident)   . DA (degenerative arthritis)   . RA (rheumatoid arthritis)     Past Surgical History  Procedure Laterality Date  . Carotid endarterectomy    . Skin lesion excision      Dr. Sarajane Jews, and Dr. Vickki Muff at [facility]    There were no vitals filed for this visit.  Visit Diagnosis:  Abnormality of gait  Decreased strength      Subjective Assessment - 07/28/14 1051    Subjective Pt reports that he has no pain upon arrival to PT. States that he has been compliant with Balance HEP and notes that he feels he has "regained some balance". Some numbness noted in upper trap last night, relieved with cervical retraction.    Pertinent History Cancer, 1991. CVA, 2003. rheumatiod arthitis.    Limitations Walking;Lifting;Standing   How long can you stand comfortably? 25 minutes. depending on the day due to leg weakness.    How long can you walk comfortably? <30 min   Patient Stated Goals improve balance and increase LE strength     Currently in Pain? No/denies         Treatment   HEP review : Tandem stance 2x30  Walking backwards 10 ft x6  Sit to stand x10.   Minimal verbal instruction to improve exercise technique including increased step length with backwards stepping and proper posture with sit to stand.   Additional balance exercise.  SLS 4x30 each LE with one HHA. Up to 5 seconds without HHA Feet together eyes open/closed 3x30 seconds  Standing on airex feet together eyes open/closed 3x30 seconds  Steps ups, 5inch step, one HHA 2x12 each LE Toe taps on 2 inch airex pad, one HHA 2x15,  each LE   PT provided CGA- MinA for balance activities to improve safety and increase success. Moderate verbal and tactile instruction provided to provided for proper exercise technique including increased hip flexion with toe taps, decreased UE support, use of ankle strategy to improve balance with narrow BOS.                           PT Education - 07/28/14 1145    Education provided Yes   Education Details advanced HEP. balance training exercises.    Person(s) Educated Patient;Spouse   Methods Explanation;Demonstration;Tactile cues;Verbal cues   Comprehension Verbalized understanding;Returned demonstration;Verbal cues required;Tactile cues required  PT Long Term Goals - 07/19/14 1612    PT LONG TERM GOAL #1   Title Increase BERG balance scale score by >10 points to indicate reduced fall risk. 09/13/2014   Baseline 36   Time 8   Period Weeks   Status New   PT LONG TERM GOAL #2   Title Increase ABC score to >65% to indicate a greater confidence with ADLs and iADLs 09/13/2014   Baseline 46   Time 8   Period Weeks   Status New   PT LONG TERM GOAL #3   Title Improve gait speed to greater than 1.58m/s to allow pt to have greater access to community with decreased risk of falls. 09/13/2014   Baseline 0.92   Time 8   Period Weeks   Status New   PT LONG TERM GOAL #4    Title Patient will be independent in home exercise program to improve strength/mobility/balance for better functional independence with ADLs by 09/13/2014   Time 8   Period Weeks   Status New   PT LONG TERM GOAL #5   Title Pt will increase L UE strength to 4+/5 in order to faciliate increased ability to complete house hold activities with great independence. 09/13/2014   Baseline 4/5   Time 8   Period Weeks   Status New               Plan - 07/28/14 1147    Clinical Impression Statement Pt reports that he has been doing his exercises everyday and notes that the last 2 days he feels that his balance has improved. PT instructed pt in HEP review for balance exercises, minimal verbal instruction required to proper technique. PT increase HEP to include SLS with UE support and narrow BOS balance training, moderate verbal and tactile cues were required for proper form of new exercise. Pt also performed additional balance exercises requiring minA-CGA to improve safety. Continued skilled PT is recommended to improve balance and increase independence within the community.    Pt will benefit from skilled therapeutic intervention in order to improve on the following deficits Abnormal gait;Decreased endurance;Decreased mobility;Decreased balance;Difficulty walking;Decreased strength;Impaired flexibility   Rehab Potential Good   Clinical Impairments Affecting Rehab Potential Positive: good familty support, motivated to get better. Negative: co-morbidities, and    PT Frequency Other (comment)  2 times a week for 1 week and then 1 time a week for rest of plan of care   PT Duration 8 weeks   PT Treatment/Interventions Moist Heat;Cryotherapy;Gait training;Stair training;Functional mobility training;Therapeutic activities;Therapeutic exercise;Balance training;Neuromuscular re-education;Patient/family education;Manual techniques   PT Next Visit Plan balance exercises and increase HEP   PT Home Exercise Plan  see patient instructions.    Consulted and Agree with Plan of Care Patient;Family member/caregiver        Problem List Patient Active Problem List   Diagnosis Date Noted  . Recurrent falls 06/29/2014  . Allergic rhinitis 05/04/2014  . 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 CAD (coronary artery disease) 04/13/2014  . Multiple lung nodules on CT 04/13/2014  . Atherosclerosis of aorta 04/13/2014  . Cough 03/31/2014  . Weight loss 03/31/2014  . Medicare annual wellness visit, subsequent 12/09/2013  . Skin lesion 06/24/2013  . Anxiety state 05/10/2013  . Weakness generalized 05/10/2013  . Chronic neck pain 11/24/2012  . Abnormal CT scan, chest 06/03/2012  . Labile hypertension 05/20/2012  . Memory loss 03/31/2012  . Adrenal insufficiency 01/20/2012  . Hyponatremia 12/24/2011  .  Fatigue 10/28/2011  . Essential thrombocythemia 06/13/2011  . Low back pain 05/19/2011  . Cardiomegaly 04/02/2011  . COPD (chronic obstructive pulmonary disease) 04/02/2011  . Rheumatoid arthritis(714.0) 03/11/2011  . Insomnia 02/17/2011  . Arthralgia 02/17/2011  . PAD (peripheral artery disease) 08/30/2009  . Carotid artery stenosis 06/10/2009  . Hyperlipidemia 06/08/2009    Barrie Folk SPT 07/28/2014   12:07 PM  This entire session was performed under direct supervision and direction of a licensed therapist. I have personally read, edited and approve of the note as written.   Hopkins,Margaret, PT, DPT 07/28/2014, 12:07 PM  [place] MAIN [facility] SERVICES [address] [place], [place], [address] Phone: [phone]   Fax:  [phone]

## 2014-07-28 NOTE — Patient Instructions (Addendum)
SINGLE LIMB STANCE  HOLD ONTO COUNTER Stance: single leg on floor. Raise leg. Hold __20_ seconds. Repeat with other leg. __4_ reps per set, _2_ sets per day, _6__ days per week     Copyright  VHI. All rights reserved.  Feet Together, Head Motion - Eyes Closed   With eyes closed and feet together, DO NOT MOVE HEAD.  Keep eyes closed for 10 seconds, then open for 20 seconds Repeat __6__ times per session. Do _2___ sessions per day.  Copyright  VHI. All rights reserved.

## 2014-08-02 ENCOUNTER — Encounter: Payer: Self-pay | Admitting: Physical Therapy

## 2014-08-02 ENCOUNTER — Ambulatory Visit: Payer: Medicare Other | Admitting: Physical Therapy

## 2014-08-02 DIAGNOSIS — M6281 Muscle weakness (generalized): Secondary | ICD-10-CM | POA: Diagnosis not present

## 2014-08-02 DIAGNOSIS — R531 Weakness: Secondary | ICD-10-CM

## 2014-08-02 DIAGNOSIS — R269 Unspecified abnormalities of gait and mobility: Secondary | ICD-10-CM | POA: Diagnosis not present

## 2014-08-02 NOTE — Therapy (Deleted)
[place] MAIN [facility] SERVICES [address] [place], [place], [address] Phone: [phone]   Fax:  [phone]  Physical Therapy Treatment  Patient Details  Name: Johnny Sanford MRN: [id number] Date of Birth: [birth year] Referring Provider:  Jackolyn Confer, MD  Encounter Date: 08/02/2014      PT End of Session - 08/02/14 1357    Visit Number 3   Number of Visits 10   Date for PT Re-Evaluation 08/18/14   Authorization Type Gcodes 3   Authorization Time Period 10   PT Start Time 1300   PT Stop Time 1350   PT Time Calculation (min) 50 min   Equipment Utilized During Treatment Gait belt   Activity Tolerance Patient tolerated treatment well   Behavior During Therapy [facility] for tasks assessed/performed      Past Medical History  Diagnosis Date  . HLD (hyperlipidemia)   . HTN (hypertension)   . GERD (gastroesophageal reflux disease)   . Hypothyroidism   . 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 CVA (cerebral vascular accident)   . DA (degenerative arthritis)   . RA (rheumatoid arthritis)     Past Surgical History  Procedure Laterality Date  . Carotid endarterectomy    . Skin lesion excision      Dr. Sarajane Jews, and Dr. Vickki Muff at [facility]    There were no vitals filed for this visit.  Visit Diagnosis:  Abnormality of gait  Decreased strength      Subjective Assessment - 08/02/14 1302    Subjective Pt states that he is doing well today. No falls since last PT visit. Remains consistent with HEP, states that he is still having problems with SLS.    Pertinent History Cancer, 1991. CVA, 2003. rheumatiod arthitis.    Limitations Walking;Lifting;Standing   How long can you stand comfortably? 25 minutes. depending on the day due to leg weakness.    How long can you walk comfortably? <30 min   Patient Stated Goals improve balance and increase LE strength    Currently in Pain? No/denies                                 PT Education - 08/02/14 1355    Education Details balance training, need for Single limb stance balance for functional activities.    Person(s) Educated Patient;Spouse   Methods Explanation;Demonstration;Tactile cues;Verbal cues   Comprehension Verbalized understanding;Returned demonstration;Verbal cues required;Tactile cues required             PT Long Term Goals - 07/19/14 1612    PT LONG TERM GOAL #1   Title Increase BERG balance scale score by >10 points to indicate reduced fall risk. 09/13/2014   Baseline 36   Time 8   Period Weeks   Status New   PT LONG TERM GOAL #2   Title Increase ABC score to >65% to indicate a greater confidence with ADLs and iADLs 09/13/2014   Baseline 46   Time 8   Period Weeks   Status New   PT LONG TERM GOAL #3   Title Improve gait speed to greater than 1.48m/s to allow pt to have greater access to community with decreased risk of falls. 09/13/2014   Baseline 0.92   Time 8   Period Weeks   Status New   PT LONG TERM GOAL #4   Title Patient will be independent in home exercise  program to improve strength/mobility/balance for better functional independence with ADLs by 09/13/2014   Time 8   Period Weeks   Status New   PT LONG TERM GOAL #5   Title Pt will increase L UE strength to 4+/5 in order to faciliate increased ability to complete house hold activities with great independence. 09/13/2014   Baseline 4/5   Time 8   Period Weeks   Status New               Plan - 08/02/14 1359    Clinical Impression Statement PT instructed patient in Balance exercises on this day. Pt demonstrates increased ability to maintain SLS bilaterally without HHA up to 10 seconds. Pt demonstrated increased single limb support on this day with decreased support with toe tapping exercise and ability to control step length and pace with ladder exercises. Only CGA needed to ball toss/balloon tap to maintain balance while standing on airex. Continued PT recommended to  increase balance and improve function within the community.    Pt will benefit from skilled therapeutic intervention in order to improve on the following deficits Abnormal gait;Decreased endurance;Decreased mobility;Decreased balance;Difficulty walking;Decreased strength;Impaired flexibility   Rehab Potential Good   Clinical Impairments Affecting Rehab Potential Positive: good familty support, motivated to get better. Negative: co-morbidities, and    PT Frequency Other (comment)  2 times a week for 1 week and then 1 time a week for rest of plan of care   PT Duration 8 weeks   PT Treatment/Interventions Moist Heat;Cryotherapy;Gait training;Stair training;Functional mobility training;Therapeutic activities;Therapeutic exercise;Balance training;Neuromuscular re-education;Patient/family education;Manual techniques   PT Next Visit Plan balance exercises/ static and dynamic   PT Home Exercise Plan Continue as previously given   Consulted and Agree with Plan of Care Patient;Family member/caregiver        Problem List Patient Active Problem List   Diagnosis Date Noted  . Recurrent falls 06/29/2014  . Allergic rhinitis 05/04/2014  . 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 CAD (coronary artery disease) 04/13/2014  . Multiple lung nodules on CT 04/13/2014  . Atherosclerosis of aorta 04/13/2014  . Cough 03/31/2014  . Weight loss 03/31/2014  . Medicare annual wellness visit, subsequent 12/09/2013  . Skin lesion 06/24/2013  . Anxiety state 05/10/2013  . Weakness generalized 05/10/2013  . Chronic neck pain 11/24/2012  . Abnormal CT scan, chest 06/03/2012  . Labile hypertension 05/20/2012  . Memory loss 03/31/2012  . Adrenal insufficiency 01/20/2012  . Hyponatremia 12/24/2011  . Fatigue 10/28/2011  . Essential thrombocythemia 06/13/2011  . Low back pain 05/19/2011  . Cardiomegaly 04/02/2011  . COPD (chronic obstructive pulmonary disease) 04/02/2011  . Rheumatoid  arthritis(714.0) 03/11/2011  . Insomnia 02/17/2011  . Arthralgia 02/17/2011  . PAD (peripheral artery disease) 08/30/2009  . Carotid artery stenosis 06/10/2009  . Hyperlipidemia 06/08/2009    Hopkins,Margaret 08/02/2014, 2:22 PM  [place] MAIN [facility] SERVICES [address] [place], [place], [address] Phone: [phone]   Fax:  [phone]

## 2014-08-02 NOTE — Therapy (Signed)
[place] MAIN [facility] SERVICES [address] [place], [place], [address] Phone: [phone]   Fax:  [phone]  Physical Therapy Treatment  Patient Details  Name: Johnny Sanford MRN: [id number] Date of Birth: 09/15/1940 Referring Provider:  Jackolyn Confer, MD  Encounter Date: 08/02/2014      PT End of Session - 08/02/14 1357    Visit Number 3   Number of Visits 10   Date for PT Re-Evaluation 08/18/14   Authorization Type Gcodes 3   Authorization Time Period 10   PT Start Time 1300   PT Stop Time 1350   PT Time Calculation (min) 50 min   Equipment Utilized During Treatment Gait belt   Activity Tolerance Patient tolerated treatment well   Behavior During Therapy [facility] for tasks assessed/performed      Past Medical History  Diagnosis Date  . HLD (hyperlipidemia)   . HTN (hypertension)   . GERD (gastroesophageal reflux disease)   . Hypothyroidism   . 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 CVA (cerebral vascular accident)   . DA (degenerative arthritis)   . RA (rheumatoid arthritis)     Past Surgical History  Procedure Laterality Date  . Carotid endarterectomy    . Skin lesion excision      Dr. Sarajane Jews, and Dr. Vickki Muff at [facility]    There were no vitals filed for this visit.  Visit Diagnosis:  Abnormality of gait  Decreased strength      Subjective Assessment - 08/02/14 1302    Subjective Pt states that he is doing well today. No falls since last PT visit. Remains consistent with HEP, states that he is still having problems with SLS.    Pertinent History Cancer, 1991. CVA, 2003. rheumatiod arthitis.    Limitations Walking;Lifting;Standing   How long can you stand comfortably? 25 minutes. depending on the day due to leg weakness.    How long can you walk comfortably? <30 min   Patient Stated Goals improve balance and increase LE strength    Currently in Pain? No/denies     Treatment   SLS 4x30 each LE with one HHA. Up to  5 seconds without HHA Standing on airex with head nods /turns 2x30 seconds each.  Feet together eyes open/closed 3x30 seconds each Standing on airex feet together eyes open/closed 3x30 seconds  Toe taps on 5  inch step, no HHA 2x15, each LE  Toe taps on 6 inch cone 2-1 HHA 2x15, each LE Ball toss, standing on airex x 2 min  Balloon tap, standing on airex  x 2 min   Ladder exercise with one foot in each space x6     PT provided CGA- MinA for balance activities to improve safety and increase success. Minimal verbal and tactile instruction provided to provide for proper exercise technique including increased hip flexion with toe taps, decreased UE support, use of ankle strategy to improve balance with narrow BOS, improved erect posture, and proper speed of exercise with toe taps and ladder exercise.                            PT Education - 08/02/14 1355    Education Details balance training, need for Single limb stance balance for functional activities.    Person(s) Educated Patient;Spouse   Methods Explanation;Demonstration;Tactile cues;Verbal cues   Comprehension Verbalized understanding;Returned demonstration;Verbal cues required;Tactile cues required  PT Long Term Goals - 07/19/14 1612    PT LONG TERM GOAL #1   Title Increase BERG balance scale score by >10 points to indicate reduced fall risk. 09/13/2014   Baseline 36   Time 8   Period Weeks   Status New   PT LONG TERM GOAL #2   Title Increase ABC score to >65% to indicate a greater confidence with ADLs and iADLs 09/13/2014   Baseline 46   Time 8   Period Weeks   Status New   PT LONG TERM GOAL #3   Title Improve gait speed to greater than 1.47m/s to allow pt to have greater access to community with decreased risk of falls. 09/13/2014   Baseline 0.92   Time 8   Period Weeks   Status New   PT LONG TERM GOAL #4   Title Patient will be independent in home exercise program to improve  strength/mobility/balance for better functional independence with ADLs by 09/13/2014   Time 8   Period Weeks   Status New   PT LONG TERM GOAL #5   Title Pt will increase L UE strength to 4+/5 in order to faciliate increased ability to complete house hold activities with great independence. 09/13/2014   Baseline 4/5   Time 8   Period Weeks   Status New               Plan - 08/02/14 1359    Clinical Impression Statement PT instructed patient in Balance exercises on this day. Pt demonstrates increased ability to maintain SLS bilaterally without HHA up to 10 seconds. Pt demonstrated increased single limb support on this day with decreased support with toe tapping exercise and ability to control step length and pace with ladder exercises. Only CGA needed to ball toss/balloon tap to maintain balance while standing on airex. Continued PT recommended to increase balance and improve function within the community.    Pt will benefit from skilled therapeutic intervention in order to improve on the following deficits Abnormal gait;Decreased endurance;Decreased mobility;Decreased balance;Difficulty walking;Decreased strength;Impaired flexibility   Rehab Potential Good   Clinical Impairments Affecting Rehab Potential Positive: good familty support, motivated to get better. Negative: co-morbidities, and    PT Frequency Other (comment)  2 times a week for 1 week and then 1 time a week for rest of plan of care   PT Duration 8 weeks   PT Treatment/Interventions Moist Heat;Cryotherapy;Gait training;Stair training;Functional mobility training;Therapeutic activities;Therapeutic exercise;Balance training;Neuromuscular re-education;Patient/family education;Manual techniques   PT Next Visit Plan balance exercises/ static and dynamic   PT Home Exercise Plan Continue as previously given   Consulted and Agree with Plan of Care Patient;Family member/caregiver        Problem List Patient Active Problem List    Diagnosis Date Noted  . Recurrent falls 06/29/2014  . Allergic rhinitis 05/04/2014  . 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 CAD (coronary artery disease) 04/13/2014  . Multiple lung nodules on CT 04/13/2014  . Atherosclerosis of aorta 04/13/2014  . Cough 03/31/2014  . Weight loss 03/31/2014  . Medicare annual wellness visit, subsequent 12/09/2013  . Skin lesion 06/24/2013  . Anxiety state 05/10/2013  . Weakness generalized 05/10/2013  . Chronic neck pain 11/24/2012  . Abnormal CT scan, chest 06/03/2012  . Labile hypertension 05/20/2012  . Memory loss 03/31/2012  . Adrenal insufficiency 01/20/2012  . Hyponatremia 12/24/2011  . Fatigue 10/28/2011  . Essential thrombocythemia 06/13/2011  . Low back pain 05/19/2011  . Cardiomegaly  04/02/2011  . COPD (chronic obstructive pulmonary disease) 04/02/2011  . Rheumatoid arthritis(714.0) 03/11/2011  . Insomnia 02/17/2011  . Arthralgia 02/17/2011  . PAD (peripheral artery disease) 08/30/2009  . Carotid artery stenosis 06/10/2009  . Hyperlipidemia 06/08/2009    Barrie Folk SPT 08/02/2014   2:26 PM  This entire session was performed under direct supervision and direction of a licensed therapist. I have personally read, edited and approve of the note as written.   Hopkins,Margaret, PT, DPT 08/02/2014, 2:26 PM  [place] MAIN [facility] SERVICES [address] [place], [place], [address] Phone: [phone]   Fax:  [phone]

## 2014-08-07 DIAGNOSIS — H6063 Unspecified chronic otitis externa, bilateral: Secondary | ICD-10-CM | POA: Diagnosis not present

## 2014-08-07 DIAGNOSIS — H6123 Impacted cerumen, bilateral: Secondary | ICD-10-CM | POA: Diagnosis not present

## 2014-08-09 ENCOUNTER — Ambulatory Visit: Payer: Medicare Other | Admitting: Physical Therapy

## 2014-08-09 ENCOUNTER — Encounter: Payer: Self-pay | Admitting: Physical Therapy

## 2014-08-09 DIAGNOSIS — M6281 Muscle weakness (generalized): Secondary | ICD-10-CM | POA: Diagnosis not present

## 2014-08-09 DIAGNOSIS — R531 Weakness: Secondary | ICD-10-CM

## 2014-08-09 DIAGNOSIS — R269 Unspecified abnormalities of gait and mobility: Secondary | ICD-10-CM | POA: Diagnosis not present

## 2014-08-09 DIAGNOSIS — G629 Polyneuropathy, unspecified: Secondary | ICD-10-CM | POA: Diagnosis not present

## 2014-08-09 NOTE — Therapy (Signed)
[place] MAIN [facility] SERVICES [address] [place], [place], [address] Phone: [phone]   Fax:  [phone]  Physical Therapy Treatment  Patient Details  Name: Johnny Sanford MRN: [id number] Date of Birth: 03-22-1940 Referring Provider:  Jackolyn Confer, MD  Encounter Date: 08/09/2014      PT End of Session - 08/09/14 1633    Visit Number 4   Number of Visits 10   Date for PT Re-Evaluation 08/18/14   Authorization Type Gcodes 4   Authorization Time Period 10   PT Start Time 1430   PT Stop Time 1514   PT Time Calculation (min) 44 min   Equipment Utilized During Treatment Gait belt   Activity Tolerance Patient tolerated treatment well;Patient limited by fatigue   Behavior During Therapy [facility] for tasks assessed/performed      Past Medical History  Diagnosis Date  . HLD (hyperlipidemia)   . HTN (hypertension)   . GERD (gastroesophageal reflux disease)   . Hypothyroidism   . 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 CVA (cerebral vascular accident)   . DA (degenerative arthritis)   . RA (rheumatoid arthritis)     Past Surgical History  Procedure Laterality Date  . Carotid endarterectomy    . Skin lesion excision      Dr. Sarajane Jews, and Dr. Vickki Muff at [facility]    There were no vitals filed for this visit.  Visit Diagnosis:  Abnormality of gait  Decreased strength      Subjective Assessment - 08/09/14 1440    Subjective Patient reports getting nerve conduction velocity test this morning. He reports that the doctor told him that the chemo/radiation aged his muscles/nerves. Patient reports increased neck pain.    Pertinent History Cancer, 1991. CVA, 2003. rheumatiod arthitis.    Limitations Walking;Lifting;Standing   How long can you stand comfortably? 25 minutes. depending on the day due to leg weakness.    How long can you walk comfortably? <30 min   Patient Stated Goals improve balance and increase LE strength    Currently in  Pain? Yes   Pain Score 10-Worst pain ever   Pain Location Neck   Pain Orientation Lateral   Pain Descriptors / Indicators Sore;Aching   Pain Type Chronic pain   Pain Radiating Towards left/right side   Pain Onset More than a month ago   Pain Frequency Intermittent   Aggravating Factors  activity   Pain Relieving Factors rest   Effect of Pain on Daily Activities decreased activity        TREATMENT: Warm up on Nustep BUE/BLE level 2 with history intake, HEP instruction x10 min (4 min unbilled);  Forward lunge with 1 HHA, x10 bilaterally; Side lunge with BUE pillow chest press x10 bilaterally with mod VCs for correct technique and to avoid flexing both knees.   Marching in place: With BUE ball up/down x10 with cues for head turns and keeping eye on the ball; With BUE ball pass side/side x10 each side with cues for slowing down and keeping march; Instructed patient in BUE ball up/down and side/side without march for cues for keeping eyes on ball;     Standing on airex: BUE heel/toe raises without rail assist 2x10  BUE mini squats 2x10 without rail assist; SLS on airex, side toe taps to 4 inch step with 2-1 rail assist x10 bilaterally;  Sit<>stand with BUE ball overhead press (yellow weighted ball, 4#) x15;  Patient required min-moderate verbal/tactile cues  for correct exercise technique. Patient required min VCs for balance stability, including to increase trunk control for less loss of balance with smaller base of support                     PT Education - 08/09/14 1632    Education provided Yes   Education Details advanced exercise, see patient instructions; balance exercise   Person(s) Educated Patient   Methods Explanation;Demonstration;Verbal cues   Comprehension Verbalized understanding;Returned demonstration;Verbal cues required             PT Long Term Goals - 07/19/14 1612    PT LONG TERM GOAL #1   Title Increase BERG balance scale score  by >10 points to indicate reduced fall risk. 09/13/2014   Baseline 36   Time 8   Period Weeks   Status New   PT LONG TERM GOAL #2   Title Increase ABC score to >65% to indicate a greater confidence with ADLs and iADLs 09/13/2014   Baseline 46   Time 8   Period Weeks   Status New   PT LONG TERM GOAL #3   Title Improve gait speed to greater than 1.84m/s to allow pt to have greater access to community with decreased risk of falls. 09/13/2014   Baseline 0.92   Time 8   Period Weeks   Status New   PT LONG TERM GOAL #4   Title Patient will be independent in home exercise program to improve strength/mobility/balance for better functional independence with ADLs by 09/13/2014   Time 8   Period Weeks   Status New   PT LONG TERM GOAL #5   Title Pt will increase L UE strength to 4+/5 in order to faciliate increased ability to complete house hold activities with great independence. 09/13/2014   Baseline 4/5   Time 8   Period Weeks   Status New               Plan - 08/09/14 1633    Clinical Impression Statement Patient was instructed in advanced balance exercise with less HHA with dynamic movement. He required increased cues for better posture, including to increase erect head for better balance control. Patient continues to demonstrate postural deficits with severe forward head having difficulty initiating cervical retraction. Patient would benefit from additional skilled PT intervention to improve balance/gait safety and increase LE strength.    Pt will benefit from skilled therapeutic intervention in order to improve on the following deficits Abnormal gait;Decreased endurance;Decreased mobility;Decreased balance;Difficulty walking;Decreased strength;Impaired flexibility   Rehab Potential Good   Clinical Impairments Affecting Rehab Potential Positive: good familty support, motivated to get better. Negative: co-morbidities, and    PT Frequency Other (comment)  2 times a week for 1 week and  then 1 time a week for rest of plan of care   PT Duration 8 weeks   PT Treatment/Interventions Moist Heat;Cryotherapy;Gait training;Stair training;Functional mobility training;Therapeutic activities;Therapeutic exercise;Balance training;Neuromuscular re-education;Patient/family education;Manual techniques   PT Next Visit Plan balance exercises/ static and dynamic   PT Home Exercise Plan Continue as previously given   Consulted and Agree with Plan of Care Patient;Family member/caregiver        Problem List Patient Active Problem List   Diagnosis Date Noted  . Recurrent falls 06/29/2014  . Allergic rhinitis 05/04/2014  . 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 CAD (coronary artery disease) 04/13/2014  . Multiple lung nodules on CT 04/13/2014  . Atherosclerosis of aorta 04/13/2014  .  Cough 03/31/2014  . Weight loss 03/31/2014  . Medicare annual wellness visit, subsequent 12/09/2013  . Skin lesion 06/24/2013  . Anxiety state 05/10/2013  . Weakness generalized 05/10/2013  . Chronic neck pain 11/24/2012  . Abnormal CT scan, chest 06/03/2012  . Labile hypertension 05/20/2012  . Memory loss 03/31/2012  . Adrenal insufficiency 01/20/2012  . Hyponatremia 12/24/2011  . Fatigue 10/28/2011  . Essential thrombocythemia 06/13/2011  . Low back pain 05/19/2011  . Cardiomegaly 04/02/2011  . COPD (chronic obstructive pulmonary disease) 04/02/2011  . Rheumatoid arthritis(714.0) 03/11/2011  . Insomnia 02/17/2011  . Arthralgia 02/17/2011  . PAD (peripheral artery disease) 08/30/2009  . Carotid artery stenosis 06/10/2009  . Hyperlipidemia 06/08/2009    Hopkins,Margaret, PT, DPT 08/09/2014, 4:35 PM  [place] MAIN [facility] SERVICES [address] [place], [place], [address] Phone: [phone]   Fax:  [phone]

## 2014-08-09 NOTE — Patient Instructions (Signed)
  Copyright  VHI. All rights reserved.  Forward Lunge   Standing with feet shoulder width apart, step forward into lunge with left leg. Hold _2___ seconds. Repeat __10__ times per session. Do __2__ sessions per day. Repeat with opposite leg.  Copyright  VHI. All rights reserved.   Lower Extremity: Lunge / Reach (Lateral)   Hold pillow in front of chest. Lunging to side, push ball forward over knee. Repeat _10__ times. Repeat with other leg for set. Rest _2__ seconds after set. Do __2_ sets per session.  http://plyo.exer.[email or web address]   Copyright  VHI. All rights reserved.  Marching in Place: [place] in place, holding ball/pillow, move up/down. Track ball movement with head and eyes. Repeat ___10_ times per session. Do __2__ sessions per day.   Copyright  VHI. All rights reserved.  Marching in Place: Ball Throw Hand to [employer] in place, throw ball(pass plate) from hand to hand. Track ball movement with head and eyes. Repeat __10__ times per session. Do __2__ sessions per day.

## 2014-08-15 DIAGNOSIS — H2513 Age-related nuclear cataract, bilateral: Secondary | ICD-10-CM | POA: Diagnosis not present

## 2014-08-16 ENCOUNTER — Ambulatory Visit: Payer: Medicare Other | Admitting: Physical Therapy

## 2014-08-16 ENCOUNTER — Encounter: Payer: Self-pay | Admitting: Physical Therapy

## 2014-08-16 DIAGNOSIS — R531 Weakness: Secondary | ICD-10-CM

## 2014-08-16 DIAGNOSIS — R269 Unspecified abnormalities of gait and mobility: Secondary | ICD-10-CM

## 2014-08-16 DIAGNOSIS — M6281 Muscle weakness (generalized): Secondary | ICD-10-CM | POA: Diagnosis not present

## 2014-08-16 NOTE — Therapy (Signed)
[place] MA[facility] SERVICES [address] [place], [place], [address] Phone: [phone]   Fax:  [phone]  Physical Therapy Treatment  Patient Details  Name: Johnny Sanford MRN: [id number] Date of Birth: 1940/09/05 Referring Provider:  Jackolyn Confer, MD  Encounter Date: 08/16/2014      PT End of Session - 08/16/14 1556    Visit Number 5   Number of Visits 10   Date for PT Re-Evaluation 09/13/14   Authorization Type Gcodes 1   Authorization Time Period 10   PT Start Time 1450   PT Stop Time 1537   PT Time Calculation (min) 47 min   Equipment Utilized During Treatment Gait belt   Activity Tolerance Patient tolerated treatment well;Patient limited by fatigue   Behavior During Therapy [facility] for tasks assessed/performed      Past Medical History  Diagnosis Date  . HLD (hyperlipidemia)   . HTN (hypertension)   . GERD (gastroesophageal reflux disease)   . Hypothyroidism   . 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 CVA (cerebral vascular accident)   . DA (degenerative arthritis)   . RA (rheumatoid arthritis)     Past Surgical History  Procedure Laterality Date  . Carotid endarterectomy    . Skin lesion excision      Dr. Sarajane Jews, and Dr. Vickki Muff at [facility]    There were no vitals filed for this visit.  Visit Diagnosis:  Abnormality of gait - Plan: PT plan of care cert/re-cert  Decreased strength - Plan: PT plan of care cert/re-cert      Subjective Assessment - 08/16/14 1455    Subjective Patient reports that he had a rough week. He expressed knee pain in the R knee. Had NCV tested last Wednesday and found that he had  neuropathy LE. Patient also reports that he was able weed-eat the lawn for the first time in over a year.     Pertinent History Cancer, 1991. CVA, 2003. rheumatiod arthitis.    Limitations Walking;Lifting;Standing   How long can you stand comfortably? 25 minutes. depending on the day due to leg weakness.    How  long can you walk comfortably? <30 min   Patient Stated Goals improve balance and increase LE strength    Currently in Pain? Yes   Pain Score 1    Pain Location Knee   Pain Orientation Right   Pain Descriptors / Indicators Dull   Pain Type Acute pain   Pain Onset In the past 7 days   Pain Frequency Intermittent   Aggravating Factors  activity            [facility] PT Assessment - 08/16/14 1509    Observation/Other Assessments   Activities of Balance Confidence Scale (ABC Scale)  73% (increased risk for falls)    Strength   Overall Strength Comments L UE grossly 4+/5 R UE 4+/5. Bilateral LE 5/5   Standardized Balance Assessment   Five times sit to stand comments  10.5 seconds (greater than 15 seconds indicates fall risk)   10 Meter Walk 1.26 m/s (places patient at decreased fall risk.)    Berg Balance Test   Sit to Stand Able to stand without using hands and stabilize independently   Standing Unsupported Able to stand safely 2 minutes   Sitting with Back Unsupported but Feet Supported on Floor or Stool Able to sit safely and securely 2 minutes   Stand to Sit Sits safely with minimal use  of hands   Transfers Able to transfer safely, minor use of hands   Standing Unsupported with Eyes Closed Able to stand 10 seconds safely   Standing Ubsupported with Feet Together Able to place feet together independently and stand 1 minute safely   From Standing, Reach Forward with Outstretched Arm Can reach forward >12 cm safely (5")   From Standing Position, Pick up Object from [place] to pick up shoe safely and easily   From Standing Position, Turn to Look Behind Over each Shoulder Turn sideways only but maintains balance   Turn 360 Degrees Able to turn 360 degrees safely in 4 seconds or less   Standing Unsupported, Alternately Place Feet on Step/Stool Able to complete >2 steps/needs minimal assist   Standing Unsupported, One Foot in Front Able to plae foot ahead of the other independently and  hold 30 seconds   Standing on One Leg Able to lift leg independently and hold equal to or more than 3 seconds   Total Score 47         Patient completed standardized outcome measures at this day including BERG, 5xSTS, 60mWT, 5xSTS and ABC-s.  Patient continues to display significant difficulty with SLS, tandem stance, and steps. As well as balance on compliant surface.                      PT Education - 08/16/14 1555    Education Details Continued plan of care, Standardized outcome measures.    Person(s) Educated Patient   Methods Explanation;Demonstration;Verbal cues   Comprehension Returned demonstration;Verbalized understanding;Verbal cues required             PT Long Term Goals - 08/16/14 1503    PT LONG TERM GOAL #1   Title Increase BERG balance scale score by >10 points to indicate reduced fall risk. 09/13/2014   Baseline 36   Time 8   Period Weeks   Status Achieved   PT LONG TERM GOAL #2   Title Increase ABC score to >65% to indicate a greater confidence with ADLs and iADLs 09/13/2014   Baseline 46   Time 8   Period Weeks   Status Achieved   PT LONG TERM GOAL #3   Title Improve gait speed to greater than 1.66m/s to allow pt to have greater access to community with decreased risk of falls. 09/13/2014   Baseline 0.92   Time 8   Period Weeks   Status Achieved   PT LONG TERM GOAL #4   Title Patient will be independent in home exercise program to improve strength/mobility/balance for better functional independence with ADLs by 09/13/2014   Time 8   Period Weeks   Status On-going   PT LONG TERM GOAL #5   Title Pt will increase L UE strength to 4+/5 in order to faciliate increased ability to complete house hold activities with great independence. 09/13/2014   Baseline 4/5   Time 8   Period Weeks   Status Achieved   Additional Long Term Goals   Additional Long Term Goals Yes   PT LONG TERM GOAL #6   Title Patient will increae Berg to at least 52 to  indicate decreaesed fall risk and improved function. by 8/17   Time 4   Period Weeks   Status New   PT LONG TERM GOAL #7   Title Patient will be able to ascend and descend a flight of stairs without hand rail assistance to improve access to community. by  8/17   Time 4   Period Weeks   Status New   PT LONG TERM GOAL #8   Title Patient will be able to stand one foot for at least 5 seconds to improve function with yard work. by 8/17   Time 4   Period Weeks   Status New               Plan - 08/16/14 1557    Clinical Impression Statement Patient instructed in standardized outcome measures on this day to assess progress towards goals. Increased strength demonstrated in L UE to 4+/5. Patient performed the Berg Balance Scale: 47( improved from 36 on 6/22), 5xSTS:10.6seconds(improved from 15.06 seconds on 6/22) 10 mWT: 1.26 m/s (improved from .70m/s on 6/22). Patient also completed the ABC-s and rated himself at 73% confident in balance activities (improved from 46% on 6/22) Patient continues to demonstrate significnat balance impairments with SLS activities and tandem stance, especially on compliant surface. Continued skilled PT is recommended to improve balance and allow greater ease of access to the community.    Pt will benefit from skilled therapeutic intervention in order to improve on the following deficits Abnormal gait;Decreased endurance;Decreased mobility;Decreased balance;Difficulty walking;Decreased strength;Impaired flexibility   Rehab Potential Good   Clinical Impairments Affecting Rehab Potential Positive: good familty support, motivated to get better. Negative: co-morbidities, and    PT Frequency 1x / week  2 times a week for 1 week and then 1 time a week for rest of plan of care   PT Duration 4 weeks   PT Treatment/Interventions Moist Heat;Cryotherapy;Gait training;Stair training;Functional mobility training;Therapeutic activities;Therapeutic exercise;Balance  training;Neuromuscular re-education;Patient/family education;Manual techniques   PT Next Visit Plan balance exercises/ static and dynamic. increase HEP.    PT Home Exercise Plan Continue as previously given   Consulted and Agree with Plan of Care Patient;Family member/caregiver          G-Codes - Sep 03, 2014 1101    Functional Assessment Tool Used Berg Balance, 10 meter, 5 times sit<>Stand, TUG, ABC simple (patient currently 35% impaired)   Functional Limitation Mobility: Walking and moving around   Mobility: Walking and Moving Around Current Status [phone]) At least 20 percent but less than 40 percent impaired, limited or restricted   Mobility: Walking and Moving Around Goal Status [phone]) At least 20 percent but less than 40 percent impaired, limited or restricted      Problem List Patient Active Problem List   Diagnosis Date Noted  . Recurrent falls 06/29/2014  . Allergic rhinitis 05/04/2014  . 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 CAD (coronary artery disease) 04/13/2014  . Multiple lung nodules on CT 04/13/2014  . Atherosclerosis of aorta 04/13/2014  . Cough 03/31/2014  . Weight loss 03/31/2014  . Medicare annual wellness visit, subsequent 12/09/2013  . Skin lesion 06/24/2013  . Anxiety state 05/10/2013  . Weakness generalized 05/10/2013  . Chronic neck pain 11/24/2012  . Abnormal CT scan, chest 06/03/2012  . Labile hypertension 05/20/2012  . Memory loss 03/31/2012  . Adrenal insufficiency 01/20/2012  . Hyponatremia 12/24/2011  . Fatigue 10/28/2011  . Essential thrombocythemia 06/13/2011  . Low back pain 05/19/2011  . Cardiomegaly 04/02/2011  . COPD (chronic obstructive pulmonary disease) 04/02/2011  . Rheumatoid arthritis(714.0) 03/11/2011  . Insomnia 02/17/2011  . Arthralgia 02/17/2011  . PAD (peripheral artery disease) 08/30/2009  . Carotid artery stenosis 06/10/2009  . Hyperlipidemia 06/08/2009  Barrie Folk, SPT This entire session was performed  under direct supervision and direction of  a licensed therapist . I have personally read, edited and approve of the note as written.   Hopkins,Margaret, PT, DPT 08/17/2014, 11:05 AM  [place] MAIN [facility] SERVICES [address] [place], [place], [address] Phone: [phone]   Fax:  [phone]

## 2014-08-17 DIAGNOSIS — M6281 Muscle weakness (generalized): Secondary | ICD-10-CM | POA: Diagnosis not present

## 2014-08-17 DIAGNOSIS — R269 Unspecified abnormalities of gait and mobility: Secondary | ICD-10-CM | POA: Diagnosis not present

## 2014-08-18 ENCOUNTER — Inpatient Hospital Stay (HOSPITAL_BASED_OUTPATIENT_CLINIC_OR_DEPARTMENT_OTHER): Payer: Medicare Other | Admitting: Oncology

## 2014-08-18 ENCOUNTER — Inpatient Hospital Stay: Payer: Medicare Other | Attending: Oncology

## 2014-08-18 VITALS — BP 141/85 | HR 74 | Temp 97.4°F | Resp 18 | Wt 152.1 lb

## 2014-08-18 DIAGNOSIS — Z79899 Other long term (current) drug therapy: Secondary | ICD-10-CM

## 2014-08-18 DIAGNOSIS — Z8673 Personal history of transient ischemic attack (TIA), and cerebral infarction without residual deficits: Secondary | ICD-10-CM | POA: Insufficient documentation

## 2014-08-18 DIAGNOSIS — E039 Hypothyroidism, unspecified: Secondary | ICD-10-CM | POA: Diagnosis not present

## 2014-08-18 DIAGNOSIS — D473 Essential (hemorrhagic) thrombocythemia: Secondary | ICD-10-CM | POA: Diagnosis not present

## 2014-08-18 DIAGNOSIS — I10 Essential (primary) hypertension: Secondary | ICD-10-CM | POA: Insufficient documentation

## 2014-08-18 DIAGNOSIS — Z87891 Personal history of nicotine dependence: Secondary | ICD-10-CM | POA: Insufficient documentation

## 2014-08-18 DIAGNOSIS — M069 Rheumatoid arthritis, unspecified: Secondary | ICD-10-CM | POA: Insufficient documentation

## 2014-08-18 DIAGNOSIS — D649 Anemia, unspecified: Secondary | ICD-10-CM

## 2014-08-18 LAB — CBC WITH DIFFERENTIAL/PLATELET
BASOS PCT: 1 %
Basophils Absolute: 0 10*3/uL (ref 0–0.1)
Eosinophils Absolute: 0 10*3/uL (ref 0–0.7)
Eosinophils Relative: 0 %
HEMATOCRIT: 31.2 % — AB (ref 40.0–52.0)
HEMOGLOBIN: 10.4 g/dL — AB (ref 13.0–18.0)
Lymphocytes Relative: 21 %
Lymphs Abs: 0.6 10*3/uL — ABNORMAL LOW (ref 1.0–3.6)
MCH: 39.2 pg — AB (ref 26.0–34.0)
MCHC: 33.2 g/dL (ref 32.0–36.0)
MCV: 117.9 fL — ABNORMAL HIGH (ref 80.0–100.0)
Monocytes Absolute: 0.6 10*3/uL (ref 0.2–1.0)
Monocytes Relative: 19 %
NEUTROS PCT: 59 %
Neutro Abs: 1.8 10*3/uL (ref 1.4–6.5)
Platelets: 513 10*3/uL — ABNORMAL HIGH (ref 150–440)
RBC: 2.65 MIL/uL — ABNORMAL LOW (ref 4.40–5.90)
RDW: 15.2 % — ABNORMAL HIGH (ref 11.5–14.5)
WBC: 3.1 10*3/uL — AB (ref 3.8–10.6)

## 2014-08-18 NOTE — Progress Notes (Signed)
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

## 2014-08-18 NOTE — Progress Notes (Signed)
[place]  [phone] [phone]  ID: Johnny Sanford OB: 27-Sep-1940  MR#: [id number]  JSE#:[id number]  Patient Care Team: Jackolyn Confer, MD as PCP - General (Internal Medicine)  CHIEF COMPLAINT:  Chief Complaint  Patient presents with  . Follow-up    thrombocytosis    INTERVAL HISTORY: Patient returns to clinic today for repeat laboratory work and further evaluation. He is tolerating 1500 mg Hydrea daily without significant side effects. He continues to have multiple medical complaints all of which are chronic and unchanged. He denies any easy bleeding or bruising.  He denies any recent fevers. He has a good appetite, but is complaining of continual weigh loss.  He denies any chest pain or shortness of breath. He denies any pain. He has no nausea, vomiting, constipation, or diarrhea.  He offers no further specific complaints today.  REVIEW OF SYSTEMS:   Review of Systems  Constitutional: Positive for weight loss.  Respiratory: Positive for shortness of breath.   Cardiovascular: Negative.   Gastrointestinal: Negative.   Neurological: Positive for sensory change.    As per HPI. Otherwise, a complete review of systems is negatve.  PAST MEDICAL HISTORY: Past Medical History  Diagnosis Date  . HLD (hyperlipidemia)   . HTN (hypertension)   . GERD (gastroesophageal reflux disease)   . Hypothyroidism   . 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 CVA (cerebral vascular accident)   . DA (degenerative arthritis)   . RA (rheumatoid arthritis)     PAST SURGICAL HISTORY: Past Surgical History  Procedure Laterality Date  . Carotid endarterectomy    . Skin lesion excision      Dr. Sarajane Jews, and Dr. Vickki Muff at [facility] Family History  Problem Relation Age of Onset  . Heart disease Father        ADVANCED DIRECTIVES:    HEALTH MAINTENANCE: History  Substance Use Topics  . Smoking status: Former Smoker -- 15 years   Types: Cigarettes    Quit date: 10/07/2001  . Smokeless tobacco: Never Used     Comment: tobacco use - no  . Alcohol Use: No     Colonoscopy:  PAP:  Bone density:  Lipid panel:  Allergies  Allergen Reactions  . Simvastatin     Anxiety, gait instability    Current Outpatient Prescriptions  Medication Sig Dispense Refill  . albuterol (PROVENTIL HFA;VENTOLIN HFA) 108 (90 BASE) MCG/ACT inhaler Inhale 2 puffs into the lungs every 6 (six) hours as needed for wheezing or shortness of breath. 1 Inhaler 0  . aspirin 81 MG EC tablet Take 162 mg by mouth daily.     . clonazePAM (KLONOPIN) 0.5 MG tablet Take 1 tablet (0.5 mg total) by mouth 3 (three) times daily as needed for anxiety. 90 tablet 3  . Cyanocobalamin (VITAMIN B-12 PO) Take by mouth daily.    Marland Kitchen escitalopram (LEXAPRO) 20 MG tablet Take 1 tablet (20 mg total) by mouth daily. 30 tablet 2  . fludrocortisone (FLORINEF) 0.1 MG tablet Take 1 tablet (0.1 mg total) by mouth daily. (Patient taking differently: Take 0.05 mg by mouth daily. ) 30 tablet 5  . fluorouracil (EFUDEX) 5 % cream Apply topically as needed. 40 g 1  . fluticasone (FLONASE) 50 MCG/ACT nasal spray Place 1 spray into both nostrils daily as needed for rhinitis. 16 g 6  . Fluticasone Furoate-Vilanterol (BREO ELLIPTA) 200-25 MCG/INH AEPB Inhale 200 mcg into the lungs daily. 1 each 6  .  hydrALAZINE (APRESOLINE) 25 MG tablet Take 25 mg by mouth 3 (three) times daily. Use as needed for BP >160/100    . hydroxyurea (HYDREA) 500 MG capsule Take 3 capsules daily. May take with food to minimize GI side effects.    Marland Kitchen levothyroxine (SYNTHROID, LEVOTHROID) 137 MCG tablet Take 1 tablet (137 mcg total) by mouth daily. 90 tablet 3  . meclizine (ANTIVERT) 25 MG tablet Take 25 mg by mouth 2 (two) times daily as needed.     . mometasone (ELOCON) 0.1 % lotion as needed.     . OMEGA-3 1000 MG CAPS Take 1 capsule by mouth daily.      Marland Kitchen omeprazole (PRILOSEC) 20 MG capsule Take 1 capsule (20 mg  total) by mouth daily. 90 capsule 3  . traMADol (ULTRAM) 50 MG tablet Take 1 tablet (50 mg total) by mouth every 8 (eight) hours as needed. 90 tablet 3  . traZODone (DESYREL) 50 MG tablet Take 0.5-1 tablets (25-50 mg total) by mouth at bedtime as needed for sleep. 30 tablet 2   No current facility-administered medications for this visit.    OBJECTIVE: Filed Vitals:   08/18/14 1038  BP: 141/85  Pulse: 74  Temp: 97.4 F (36.3 C)  Resp: 18     Body mass index is 25.31 kg/(m^2).    ECOG FS:1 - Symptomatic but completely ambulatory  General: Well-developed, well-nourished, no acute distress. Eyes: anicteric sclera. HEENT: Normocephalic, moist mucous membranes, clear oropharnyx. Lungs: Clear to auscultation bilaterally. Heart: Regular rate and rhythm. No rubs, murmurs, or gallops. Abdomen: Soft, nontender, nondistended. No organomegaly noted, normoactive bowel sounds. Musculoskeletal: Joint disformity noted secondary to underlying arthritis. Neuro: Alert, answering all questions appropriately. Cranial nerves grossly intact. Skin: No rashes or petechiae noted. Psych: Normal affect.   LAB RESULTS:  Lab Results  Component Value Date   NA 127* 06/12/2014   K 4.6 06/12/2014   CL 89* 06/12/2014   CO2 34* 06/12/2014   GLUCOSE 96 06/12/2014   BUN 19 06/12/2014   CREATININE 0.79 06/12/2014   CALCIUM 8.9 06/12/2014   PROT 7.0 04/05/2014   ALBUMIN 3.8 04/05/2014   AST 19 04/05/2014   ALT 12 04/05/2014   ALKPHOS 46 04/05/2014   BILITOT 0.5 04/05/2014   GFRNONAA 94 03/17/2014   GFRAA 108 03/17/2014    Lab Results  Component Value Date   WBC 3.1* 08/18/2014   NEUTROABS 1.8 08/18/2014   HGB 10.4* 08/18/2014   HCT 31.2* 08/18/2014   MCV 117.9* 08/18/2014   PLT 513* 08/18/2014     STUDIES: No results found.  ASSESSMENT: Essential thrombocytosis.   PLAN:    1.  ET: Previously, bone marrow biopsy consistent with essential thrombocytosis.  Patient's platelet count is  slightly elevated today, but is unclear of patient's compliance with Hydrea. Continue current dose of Hydrea at 1500 mg. Patient's white blood cell count is only mildly decreased.  No intervention or changes in his dosing are needed at this time. Return to clinic in 3 months with repeat laboratory work and further evaluation. Previously, ultrasound of the abdomen did not reveal any splenomegaly.   2. Anemia: Chronic. Previously iron stores were within normal limits. Possibly secondary to Hydrea. No intervention is needed at this time.  Patient expressed understanding and was in agreement with this plan. He also understands that He can call clinic at any time with any questions, concerns, or complaints.    Lloyd Huger, MD   08/18/2014 12:54 PM

## 2014-08-23 ENCOUNTER — Ambulatory Visit: Payer: Medicare Other | Admitting: Physical Therapy

## 2014-08-23 ENCOUNTER — Encounter: Payer: Self-pay | Admitting: Physical Therapy

## 2014-08-23 DIAGNOSIS — R269 Unspecified abnormalities of gait and mobility: Secondary | ICD-10-CM | POA: Diagnosis not present

## 2014-08-23 DIAGNOSIS — R531 Weakness: Secondary | ICD-10-CM

## 2014-08-23 DIAGNOSIS — M6281 Muscle weakness (generalized): Secondary | ICD-10-CM | POA: Diagnosis not present

## 2014-08-23 NOTE — Patient Instructions (Signed)
Body-Weight Step-Up: Stable (Active)   Head up, back flat, place one foot on step. Bring other leg up on to step. Complete _2__ sets of __10_ repetitions. Perform __2_ sessions per day.  http://gtsc.exer.us/512   Copyright  VHI. All rights reserved.  AMBULATION: Side Step   Step sideways. Repeat in opposite direction. __20_ reps per set, __2_ sets per day, ___5 days per week.   Copyright  VHI. All rights reserved.

## 2014-08-23 NOTE — Therapy (Signed)
[place] MAIN [facility] SERVICES [address] [place], [place], [address] Phone: [phone]   Fax:  [phone]  Physical Therapy Treatment  Patient Details  Name: Johnny Sanford MRN: [id number] Date of Birth: [birth year] Referring Provider:  Anabel Bene, MD  Encounter Date: 08/23/2014      PT End of Session - 08/23/14 1540    Visit Number 6   Number of Visits 10   Date for PT Re-Evaluation 09/13/14   Authorization Type Gcodes 2   Authorization Time Period 10   PT Start Time 1400   PT Stop Time 1445   PT Time Calculation (min) 45 min   Equipment Utilized During Treatment Gait belt   Activity Tolerance Patient tolerated treatment well;Patient limited by fatigue   Behavior During Therapy [facility] for tasks assessed/performed      Past Medical History  Diagnosis Date  . HLD (hyperlipidemia)   . HTN (hypertension)   . GERD (gastroesophageal reflux disease)   . Hypothyroidism   . 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 CVA (cerebral vascular accident)   . DA (degenerative arthritis)   . RA (rheumatoid arthritis)     Past Surgical History  Procedure Laterality Date  . Carotid endarterectomy    . Skin lesion excision      Dr. Sarajane Jews, and Dr. Vickki Muff at [facility]    There were no vitals filed for this visit.  Visit Diagnosis:  Abnormality of gait  Decreased strength      Subjective Assessment - 08/23/14 1410    Subjective Patient reports that he is doing okay on this day, States that he has some slight knee pain, but "not too bad" States that he has been practicing SLS, tandem stance, and toe tap exercises and has made good progress with them.    Pertinent History Cancer, 1991. CVA, 2003. rheumatiod arthitis.    Limitations Walking;Lifting;Standing   How long can you stand comfortably? 25 minutes. depending on the day due to leg weakness.    How long can you walk comfortably? <30 min   Patient Stated Goals improve balance and  increase LE strength    Currently in Pain? Yes   Pain Score 2    Pain Location Knee   Pain Orientation Right   Pain Descriptors / Indicators Dull   Pain Type Acute pain   Pain Onset In the past 7 days   Pain Frequency Intermittent         treatment:  Nustep, 4 minutes, level 5, legs only ( unbilled)   Tandem stance 3x30 seconds each LE SLS 3x30 seconds each LE Up to 6 seconds without UE support Toe tap on 6 inch step 2x15 each LE  Normal BOS standing on balance dyna-discs 2x30  Standing on dyna-disc weight shift R and L 2x20 each direction.  Stepping over bolsters 5 bolsters  2-6 inches, x4 laps   CGA provided for all balance exercises. Min verbal and tactile instruction provided to increase hip strategy to correct posterior LOB, decrease speed of movements and increase control with toe tap exercises, proper use of UE, and cues for prevent circumduction with step over and increase heel strike with landing.    HEP advanced  Step ups x10 each LE  Side stepping 25 feet each direction x2  Verbal instruction for proper UE use, proper speed of movement, and increased step length  PT Education - 08/23/14 1539    Education provided Yes   Education Details Advanced HEP, see patient instructions.    Person(s) Educated Patient   Methods Explanation;Demonstration;Tactile cues;Verbal cues   Comprehension Verbalized understanding;Returned demonstration;Verbal cues required;Tactile cues required             PT Long Term Goals - 08/16/14 1503    PT LONG TERM GOAL #1   Title Increase BERG balance scale score by >10 points to indicate reduced fall risk. 09/13/2014   Baseline 36   Time 8   Period Weeks   Status Achieved   PT LONG TERM GOAL #2   Title Increase ABC score to >65% to indicate a greater confidence with ADLs and iADLs 09/13/2014   Baseline 46   Time 8   Period Weeks   Status Achieved   PT LONG TERM GOAL #3   Title Improve  gait speed to greater than 1.13m/s to allow pt to have greater access to community with decreased risk of falls. 09/13/2014   Baseline 0.92   Time 8   Period Weeks   Status Achieved   PT LONG TERM GOAL #4   Title Patient will be independent in home exercise program to improve strength/mobility/balance for better functional independence with ADLs by 09/13/2014   Time 8   Period Weeks   Status On-going   PT LONG TERM GOAL #5   Title Pt will increase L UE strength to 4+/5 in order to faciliate increased ability to complete house hold activities with great independence. 09/13/2014   Baseline 4/5   Time 8   Period Weeks   Status Achieved   Additional Long Term Goals   Additional Long Term Goals Yes   PT LONG TERM GOAL #6   Title Patient will increae Berg to at least 52 to indicate decreaesed fall risk and improved function. by 8/17   Time 4   Period Weeks   Status New   PT LONG TERM GOAL #7   Title Patient will be able to ascend and descend a flight of stairs without hand rail assistance to improve access to community. by 8/17   Time 4   Period Weeks   Status New   PT LONG TERM GOAL #8   Title Patient will be able to stand one foot for at least 5 seconds to improve function with yard work. by 8/17   Time 4   Period Weeks   Status New               Plan - 08/23/14 1541    Clinical Impression Statement Patient arrives to PT and states that he feels that he is doing much better with his balance exercises at home. On this day, patient instructed in balance training exercises. CGA provided for all balance exercises, as well as min verbal instruction for proper exercise technique including to decrease speed of movement and proper use of hip strategy to prevent posterior LOB. HEP advanced on this day, min verbal instruction provided to decrease use of UE and proper speed, good response to instruction. Continued skilled PT is recommended to improve balance and allow greater independence  with community ambulation.   Pt will benefit from skilled therapeutic intervention in order to improve on the following deficits Abnormal gait;Decreased endurance;Decreased mobility;Decreased balance;Difficulty walking;Decreased strength;Impaired flexibility   Rehab Potential Good   Clinical Impairments Affecting Rehab Potential Positive: good familty support, motivated to get better. Negative: co-morbidities, and    PT Frequency 1x /  week  2 times a week for 1 week and then 1 time a week for rest of plan of care   PT Duration 4 weeks   PT Treatment/Interventions Moist Heat;Cryotherapy;Gait training;Stair training;Functional mobility training;Therapeutic activities;Therapeutic exercise;Balance training;Neuromuscular re-education;Patient/family education;Manual techniques   PT Next Visit Plan balance exercises/ static and dynamic.    PT Home Exercise Plan advanced HEP - see patient instructions.    Consulted and Agree with Plan of Care Patient;Family member/caregiver        Problem List Patient Active Problem List   Diagnosis Date Noted  . Recurrent falls 06/29/2014  . Allergic rhinitis 05/04/2014  . 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 CAD (coronary artery disease) 04/13/2014  . Multiple lung nodules on CT 04/13/2014  . Atherosclerosis of aorta 04/13/2014  . Cough 03/31/2014  . Weight loss 03/31/2014  . Medicare annual wellness visit, subsequent 12/09/2013  . Skin lesion 06/24/2013  . Anxiety state 05/10/2013  . Weakness generalized 05/10/2013  . Chronic neck pain 11/24/2012  . Abnormal CT scan, chest 06/03/2012  . Labile hypertension 05/20/2012  . Memory loss 03/31/2012  . Adrenal insufficiency 01/20/2012  . Hyponatremia 12/24/2011  . Fatigue 10/28/2011  . Essential thrombocythemia 06/13/2011  . Low back pain 05/19/2011  . Cardiomegaly 04/02/2011  . COPD (chronic obstructive pulmonary disease) 04/02/2011  . Rheumatoid arthritis(714.0) 03/11/2011  . Insomnia  02/17/2011  . Arthralgia 02/17/2011  . PAD (peripheral artery disease) 08/30/2009  . Carotid artery stenosis 06/10/2009  . Hyperlipidemia 06/08/2009   Barrie Folk, SPT This entire session was performed under direct supervision and direction of a licensed therapist. I have personally read, edited and approve of the note as written.  Hopkins,Margaret 08/23/2014, 5:16 PM  [place] MAIN [facility] SERVICES [address] [place], [place], [address] Phone: [phone]   Fax:  [phone]

## 2014-08-30 ENCOUNTER — Ambulatory Visit: Payer: Medicare Other | Admitting: Physical Therapy

## 2014-09-06 ENCOUNTER — Encounter: Payer: Medicare Other | Admitting: Physical Therapy

## 2014-09-11 DIAGNOSIS — R531 Weakness: Secondary | ICD-10-CM | POA: Diagnosis not present

## 2014-09-11 DIAGNOSIS — R634 Abnormal weight loss: Secondary | ICD-10-CM | POA: Diagnosis not present

## 2014-09-11 DIAGNOSIS — I10 Essential (primary) hypertension: Secondary | ICD-10-CM | POA: Diagnosis not present

## 2014-09-12 DIAGNOSIS — H6123 Impacted cerumen, bilateral: Secondary | ICD-10-CM | POA: Diagnosis not present

## 2014-09-13 ENCOUNTER — Encounter: Payer: Medicare Other | Admitting: Physical Therapy

## 2014-09-14 ENCOUNTER — Other Ambulatory Visit: Payer: Self-pay

## 2014-09-14 MED ORDER — FLUDROCORTISONE ACETATE 0.1 MG PO TABS: 0.1000 mg | ORAL_TABLET | Freq: Every day | ORAL | Status: DC

## 2014-09-18 DIAGNOSIS — L570 Actinic keratosis: Secondary | ICD-10-CM | POA: Diagnosis not present

## 2014-09-18 DIAGNOSIS — Z85828 Personal history of other malignant neoplasm of skin: Secondary | ICD-10-CM | POA: Diagnosis not present

## 2014-09-22 ENCOUNTER — Other Ambulatory Visit: Payer: Self-pay | Admitting: Internal Medicine

## 2014-09-22 DIAGNOSIS — I959 Hypotension, unspecified: Secondary | ICD-10-CM | POA: Diagnosis not present

## 2014-09-22 DIAGNOSIS — E871 Hypo-osmolality and hyponatremia: Secondary | ICD-10-CM | POA: Diagnosis not present

## 2014-09-22 DIAGNOSIS — E875 Hyperkalemia: Secondary | ICD-10-CM | POA: Diagnosis not present

## 2014-09-22 DIAGNOSIS — E1129 Type 2 diabetes mellitus with other diabetic kidney complication: Secondary | ICD-10-CM | POA: Diagnosis not present

## 2014-09-22 DIAGNOSIS — F411 Generalized anxiety disorder: Secondary | ICD-10-CM

## 2014-09-22 NOTE — Telephone Encounter (Signed)
Ok to refill lexapro please advise.

## 2014-09-22 NOTE — Telephone Encounter (Signed)
Has this medication been filled recently? Looks like it was started in 2015? This is a higher dose of Lexapro. He would not want to abrubtly start this dose.

## 2014-09-26 ENCOUNTER — Telehealth: Payer: Self-pay | Admitting: Internal Medicine

## 2014-09-26 DIAGNOSIS — H2513 Age-related nuclear cataract, bilateral: Secondary | ICD-10-CM | POA: Diagnosis not present

## 2014-09-26 MED ORDER — ESCITALOPRAM OXALATE 20 MG PO TABS: 20.0000 mg | ORAL_TABLET | Freq: Every day | ORAL | Status: DC

## 2014-09-26 NOTE — Telephone Encounter (Signed)
Dr. Rolly Salter sent over labs which showed sodium of 124. Can you ask the patient if he made any changes or has follow up labs scheduled?

## 2014-09-26 NOTE — Telephone Encounter (Signed)
Tried to reach patient by phone no answer

## 2014-09-26 NOTE — Telephone Encounter (Signed)
Called [facility] last fill for Lexapro was for 20 mg on 08/27/14, prescribed by MD starting script 05/22/14 talked with Clair Gulling pharmacist.

## 2014-09-27 DIAGNOSIS — R1312 Dysphagia, oropharyngeal phase: Secondary | ICD-10-CM | POA: Diagnosis not present

## 2014-09-27 DIAGNOSIS — H903 Sensorineural hearing loss, bilateral: Secondary | ICD-10-CM | POA: Diagnosis not present

## 2014-09-27 DIAGNOSIS — H6121 Impacted cerumen, right ear: Secondary | ICD-10-CM | POA: Diagnosis not present

## 2014-09-27 DIAGNOSIS — J0130 Acute sphenoidal sinusitis, unspecified: Secondary | ICD-10-CM | POA: Diagnosis not present

## 2014-09-27 DIAGNOSIS — B379 Candidiasis, unspecified: Secondary | ICD-10-CM | POA: Diagnosis not present

## 2014-09-27 DIAGNOSIS — C07 Malignant neoplasm of parotid gland: Secondary | ICD-10-CM | POA: Diagnosis not present

## 2014-10-05 ENCOUNTER — Encounter: Payer: Self-pay | Admitting: Internal Medicine

## 2014-10-05 ENCOUNTER — Ambulatory Visit (INDEPENDENT_AMBULATORY_CARE_PROVIDER_SITE_OTHER): Payer: Medicare Other | Admitting: Internal Medicine

## 2014-10-05 ENCOUNTER — Telehealth: Payer: Self-pay | Admitting: *Deleted

## 2014-10-05 VITALS — BP 159/96 | HR 82 | Temp 98.1°F | Ht 65.0 in | Wt 153.5 lb

## 2014-10-05 DIAGNOSIS — R5382 Chronic fatigue, unspecified: Secondary | ICD-10-CM

## 2014-10-05 DIAGNOSIS — J411 Mucopurulent chronic bronchitis: Secondary | ICD-10-CM | POA: Diagnosis not present

## 2014-10-05 DIAGNOSIS — I10 Essential (primary) hypertension: Secondary | ICD-10-CM | POA: Diagnosis not present

## 2014-10-05 DIAGNOSIS — D473 Essential (hemorrhagic) thrombocythemia: Secondary | ICD-10-CM | POA: Diagnosis not present

## 2014-10-05 DIAGNOSIS — E871 Hypo-osmolality and hyponatremia: Secondary | ICD-10-CM

## 2014-10-05 DIAGNOSIS — R0989 Other specified symptoms and signs involving the circulatory and respiratory systems: Secondary | ICD-10-CM

## 2014-10-05 LAB — COMPREHENSIVE METABOLIC PANEL
ALT: 13 U/L (ref 0–53)
AST: 20 U/L (ref 0–37)
Albumin: 4 g/dL (ref 3.5–5.2)
Alkaline Phosphatase: 48 U/L (ref 39–117)
BILIRUBIN TOTAL: 0.4 mg/dL (ref 0.2–1.2)
BUN: 18 mg/dL (ref 6–23)
CALCIUM: 8.7 mg/dL (ref 8.4–10.5)
CHLORIDE: 90 meq/L — AB (ref 96–112)
CO2: 31 meq/L (ref 19–32)
Creatinine, Ser: 0.73 mg/dL (ref 0.40–1.50)
GFR: 111.87 mL/min (ref >60.00)
GLUCOSE: 81 mg/dL (ref 70–99)
POTASSIUM: 4.8 meq/L (ref 3.5–5.1)
Sodium: 127 mEq/L — ABNORMAL LOW (ref 135–145)
Total Protein: 6.7 g/dL (ref 6.0–8.3)

## 2014-10-05 NOTE — Assessment & Plan Note (Signed)
Fatigue likely multifactorial with ongoing treatment for ET, COPD, hyponatremia, labile BP, neuropathy. Will repeat CMP today.

## 2014-10-05 NOTE — Telephone Encounter (Signed)
Pt called requesting lab results from today.  Please advise

## 2014-10-05 NOTE — Assessment & Plan Note (Signed)
Exam improved some today. Encouraged his wife to continue to smoke outside to help him avoid second hand smoke exposure. Continue Breo and follow up with Pulmonary.

## 2014-10-05 NOTE — Assessment & Plan Note (Signed)
BP Readings from Last 3 Encounters:  10/05/14 159/96  08/18/14 141/85  07/04/14 122/72   Continue prn Hydralazine for elevated BP>160/100

## 2014-10-05 NOTE — Patient Instructions (Signed)
Labs today.   Follow up in 3 months.  

## 2014-10-05 NOTE — Assessment & Plan Note (Signed)
Recent platelet count stable. Continue follow up with Dr. Grayland Ormond and current dose of Hydrea.

## 2014-10-05 NOTE — Assessment & Plan Note (Signed)
Repeat Na with labs today.

## 2014-10-05 NOTE — Progress Notes (Signed)
Subjective:    Patient ID: Johnny Sanford, male    DOB: 1940-03-08, 74 y.o.   MRN: [id number]  HPI  [name] presents for follow up.  Seen by Dr. Pryor Ochoa. Started on Bactrim for sinus infection. Nystatin for thrush.  Feeling more anxious on this medication. Taking Clonazepam for this.  Neurology - Had EMG testing which suggested neuropathy. Neurology felt secondary to previous chemo.Planning to start PT to help with balance.  Feeling tired generally. Appetite good. Sleeps well at night. No focal symptoms.  Recently seen by Dr. Grayland Ormond. Plt near 500. No change made in Hydrea.  Also recently seen by Dr. Rolly Salter. Had low Na level, repeat labs next week pending.  Supplementing diet with protein shakes.  BP continues to be labile, in low 100s at home for systolic.  BP Readings from Last 3 Encounters:  10/05/14 159/96  08/18/14 141/85  07/04/14 122/72   Wt Readings from Last 3 Encounters:  10/05/14 153 lb 8 oz (69.627 kg)  08/18/14 152 lb 1.9 oz (69.001 kg)  07/04/14 155 lb (70.308 kg)      Past Medical History  Diagnosis Date  . HLD (hyperlipidemia)   . HTN (hypertension)   . GERD (gastroesophageal reflux disease)   . Hypothyroidism   . 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 CVA (cerebral vascular accident)   . DA (degenerative arthritis)   . RA (rheumatoid arthritis)    Family History  Problem Relation Age of Onset  . Heart disease Father    Past Surgical History  Procedure Laterality Date  . Carotid endarterectomy    . Skin lesion excision      Dr. Sarajane Jews, and Dr. Vickki Muff at [facility]  . Marital Status: Married    Spouse Name: N/A  . Number of Children: N/A  . Years of Education: N/A   Social History Main Topics  . Smoking status: Former Smoker -- 15 years    Types: Cigarettes    Quit date: 10/07/2001  . Smokeless tobacco: Never Used     Comment: tobacco use - no  . Alcohol Use: No  . Drug Use: No  . Sexual Activity:  Not Asked   Other Topics Concern  . None   Social History Narrative   Married, retired, gets regular exercise.       Cell # [id number]    Review of Systems  Constitutional: Positive for fatigue. Negative for fever, chills, activity change, appetite change and unexpected weight change.  Eyes: Negative for visual disturbance.  Respiratory: Negative for cough, chest tightness, shortness of breath and wheezing.   Cardiovascular: Negative for chest pain, palpitations and leg swelling.  Gastrointestinal: Negative for nausea, vomiting, abdominal pain, diarrhea, constipation and abdominal distention.  Genitourinary: Negative for dysuria, urgency and difficulty urinating.  Musculoskeletal: Negative for myalgias, arthralgias and gait problem.  Skin: Negative for color change and rash.  Neurological: Positive for dizziness, weakness, light-headedness and numbness. Negative for speech difficulty and headaches.  Hematological: Negative for adenopathy.  Psychiatric/Behavioral: Negative for suicidal ideas, sleep disturbance and dysphoric mood. The patient is nervous/anxious.        Objective:    BP 159/96 mmHg  Pulse 82  Temp(Src) 98.1 F (36.7 C) (Oral)  Ht 5\' 5"  (1.651 m)  Wt 153 lb 8 oz (69.627 kg)  BMI 25.54 kg/m2 Physical Exam  Constitutional: He is oriented to person, place, and time. He appears well-developed and well-nourished. No distress.  HENT:  Head: Normocephalic and atraumatic.  Right Ear: External ear normal.  Left Ear: External ear normal.  Nose: Nose normal.  Mouth/Throat: Oropharyngeal exudate (few white patches back of throat) present. No posterior oropharyngeal erythema.  Eyes: Conjunctivae and EOM are normal. Pupils are equal, round, and reactive to light. Right eye exhibits no discharge. Left eye exhibits no discharge. No scleral icterus.  Neck: Normal range of motion. Neck supple. No tracheal deviation present. No thyromegaly present.  Cardiovascular: Normal rate,  regular rhythm and normal heart sounds.  Exam reveals no gallop and no friction rub.   No murmur heard. Pulmonary/Chest: Effort normal. No accessory muscle usage. No tachypnea. No respiratory distress. He has no decreased breath sounds. He has no wheezes. He has rhonchi (few scattered). He has no rales. He exhibits no tenderness.  Musculoskeletal: Normal range of motion. He exhibits no edema.  Lymphadenopathy:    He has no cervical adenopathy.  Neurological: He is alert and oriented to person, place, and time. No cranial nerve deficit. Coordination normal.  Skin: Skin is warm and dry. No rash noted. He is not diaphoretic. No erythema. No pallor.  Psychiatric: His speech is normal and behavior is normal. Judgment and thought content normal. His mood appears anxious. Cognition and memory are normal.          Assessment & Plan:   Problem List Items Addressed This Visit      Unprioritized   COPD (chronic obstructive pulmonary disease) - Primary    Exam improved some today. Encouraged his wife to continue to smoke outside to help him avoid second hand smoke exposure. Continue Breo and follow up with Pulmonary.      Essential thrombocythemia    Recent platelet count stable. Continue follow up with Dr. Grayland Ormond and current dose of Hydrea.      Fatigue    Fatigue likely multifactorial with ongoing treatment for ET, COPD, hyponatremia, labile BP, neuropathy. Will repeat CMP today.      Hyponatremia    Repeat Na with labs today.      Relevant Orders   Comprehensive metabolic panel   Labile hypertension    BP Readings from Last 3 Encounters:  10/05/14 159/96  08/18/14 141/85  07/04/14 122/72   Continue prn Hydralazine for elevated BP>160/100          Return in about 3 months (around 01/04/2015) for Recheck.

## 2014-10-05 NOTE — Progress Notes (Signed)
Pre visit review using our clinic review tool, if applicable. No additional management support is needed unless otherwise documented below in the visit note. 

## 2014-10-06 NOTE — Telephone Encounter (Signed)
I sent a message. Sodium level was stable.

## 2014-10-11 ENCOUNTER — Other Ambulatory Visit: Payer: Self-pay

## 2014-10-11 DIAGNOSIS — G4700 Insomnia, unspecified: Secondary | ICD-10-CM

## 2014-10-11 MED ORDER — TRAZODONE HCL 50 MG PO TABS: 25.0000 mg | ORAL_TABLET | Freq: Every evening | ORAL | Status: DC | PRN

## 2014-10-19 DIAGNOSIS — E871 Hypo-osmolality and hyponatremia: Secondary | ICD-10-CM | POA: Diagnosis not present

## 2014-10-19 DIAGNOSIS — J984 Other disorders of lung: Secondary | ICD-10-CM | POA: Diagnosis not present

## 2014-10-19 DIAGNOSIS — D696 Thrombocytopenia, unspecified: Secondary | ICD-10-CM | POA: Diagnosis not present

## 2014-10-19 DIAGNOSIS — I959 Hypotension, unspecified: Secondary | ICD-10-CM | POA: Diagnosis not present

## 2014-10-19 DIAGNOSIS — E876 Hypokalemia: Secondary | ICD-10-CM | POA: Diagnosis not present

## 2014-10-27 DIAGNOSIS — R1312 Dysphagia, oropharyngeal phase: Secondary | ICD-10-CM | POA: Diagnosis not present

## 2014-10-27 DIAGNOSIS — H6121 Impacted cerumen, right ear: Secondary | ICD-10-CM | POA: Diagnosis not present

## 2014-10-27 DIAGNOSIS — H903 Sensorineural hearing loss, bilateral: Secondary | ICD-10-CM | POA: Diagnosis not present

## 2014-10-27 DIAGNOSIS — B379 Candidiasis, unspecified: Secondary | ICD-10-CM | POA: Diagnosis not present

## 2014-10-27 DIAGNOSIS — C07 Malignant neoplasm of parotid gland: Secondary | ICD-10-CM | POA: Diagnosis not present

## 2014-11-09 DIAGNOSIS — E875 Hyperkalemia: Secondary | ICD-10-CM | POA: Diagnosis not present

## 2014-11-09 DIAGNOSIS — I959 Hypotension, unspecified: Secondary | ICD-10-CM | POA: Diagnosis not present

## 2014-11-09 DIAGNOSIS — E876 Hypokalemia: Secondary | ICD-10-CM | POA: Diagnosis not present

## 2014-11-09 DIAGNOSIS — D641 Secondary sideroblastic anemia due to disease: Secondary | ICD-10-CM | POA: Diagnosis not present

## 2014-11-09 DIAGNOSIS — E871 Hypo-osmolality and hyponatremia: Secondary | ICD-10-CM | POA: Diagnosis not present

## 2014-11-16 ENCOUNTER — Ambulatory Visit: Payer: Medicare Other | Admitting: Internal Medicine

## 2014-11-17 ENCOUNTER — Inpatient Hospital Stay (HOSPITAL_BASED_OUTPATIENT_CLINIC_OR_DEPARTMENT_OTHER): Payer: Medicare Other | Admitting: Oncology

## 2014-11-17 ENCOUNTER — Inpatient Hospital Stay: Payer: Medicare Other | Attending: Oncology

## 2014-11-17 VITALS — BP 175/96 | HR 74 | Temp 97.6°F | Resp 20 | Wt 151.0 lb

## 2014-11-17 DIAGNOSIS — M069 Rheumatoid arthritis, unspecified: Secondary | ICD-10-CM | POA: Diagnosis not present

## 2014-11-17 DIAGNOSIS — Z87891 Personal history of nicotine dependence: Secondary | ICD-10-CM | POA: Diagnosis not present

## 2014-11-17 DIAGNOSIS — D649 Anemia, unspecified: Secondary | ICD-10-CM | POA: Diagnosis not present

## 2014-11-17 DIAGNOSIS — D473 Essential (hemorrhagic) thrombocythemia: Secondary | ICD-10-CM

## 2014-11-17 DIAGNOSIS — I10 Essential (primary) hypertension: Secondary | ICD-10-CM | POA: Diagnosis not present

## 2014-11-17 DIAGNOSIS — Z79899 Other long term (current) drug therapy: Secondary | ICD-10-CM | POA: Diagnosis not present

## 2014-11-17 DIAGNOSIS — D75839 Thrombocytosis, unspecified: Secondary | ICD-10-CM

## 2014-11-17 DIAGNOSIS — E785 Hyperlipidemia, unspecified: Secondary | ICD-10-CM | POA: Insufficient documentation

## 2014-11-17 DIAGNOSIS — E039 Hypothyroidism, unspecified: Secondary | ICD-10-CM | POA: Diagnosis not present

## 2014-11-17 DIAGNOSIS — Z8673 Personal history of transient ischemic attack (TIA), and cerebral infarction without residual deficits: Secondary | ICD-10-CM | POA: Insufficient documentation

## 2014-11-17 DIAGNOSIS — K219 Gastro-esophageal reflux disease without esophagitis: Secondary | ICD-10-CM | POA: Insufficient documentation

## 2014-11-17 LAB — CBC WITH DIFFERENTIAL/PLATELET
Basophils Absolute: 0 10*3/uL (ref 0–0.1)
Basophils Relative: 1 %
EOS PCT: 1 %
Eosinophils Absolute: 0 10*3/uL (ref 0–0.7)
HEMATOCRIT: 29.2 % — AB (ref 40.0–52.0)
HEMOGLOBIN: 10 g/dL — AB (ref 13.0–18.0)
LYMPHS PCT: 20 %
Lymphs Abs: 0.6 10*3/uL — ABNORMAL LOW (ref 1.0–3.6)
MCH: 42.3 pg — ABNORMAL HIGH (ref 26.0–34.0)
MCHC: 34.3 g/dL (ref 32.0–36.0)
MCV: 123.3 fL — ABNORMAL HIGH (ref 80.0–100.0)
MONO ABS: 0.5 10*3/uL (ref 0.2–1.0)
MONOS PCT: 16 %
Neutro Abs: 2 10*3/uL (ref 1.4–6.5)
Neutrophils Relative %: 62 %
Platelets: 495 10*3/uL — ABNORMAL HIGH (ref 150–440)
RBC: 2.37 MIL/uL — ABNORMAL LOW (ref 4.40–5.90)
RDW: 15.9 % — ABNORMAL HIGH (ref 11.5–14.5)
WBC: 3.2 10*3/uL — ABNORMAL LOW (ref 3.8–10.6)

## 2014-11-17 NOTE — Progress Notes (Signed)
Patient here today for routine follow up, patient and wife concerned about weight loss.

## 2014-11-27 ENCOUNTER — Encounter: Payer: Self-pay | Admitting: Internal Medicine

## 2014-11-27 ENCOUNTER — Ambulatory Visit (INDEPENDENT_AMBULATORY_CARE_PROVIDER_SITE_OTHER): Payer: Medicare Other | Admitting: Internal Medicine

## 2014-11-27 VITALS — BP 144/80 | HR 79 | Ht 63.5 in | Wt 152.2 lb

## 2014-11-27 DIAGNOSIS — J411 Mucopurulent chronic bronchitis: Secondary | ICD-10-CM

## 2014-11-27 DIAGNOSIS — R918 Other nonspecific abnormal finding of lung field: Secondary | ICD-10-CM

## 2014-11-27 DIAGNOSIS — R062 Wheezing: Secondary | ICD-10-CM

## 2014-11-27 MED ORDER — FLUTICASONE FUROATE-VILANTEROL 200-25 MCG/INH IN AEPB: 1.0000 | INHALATION_SPRAY | Freq: Every day | RESPIRATORY_TRACT | Status: DC

## 2014-11-27 MED ORDER — IPRATROPIUM-ALBUTEROL 0.5-2.5 (3) MG/3ML IN SOLN
3.0000 mL | Freq: Once | RESPIRATORY_TRACT | Status: AC
  Administered 2014-11-27: 3 mL via RESPIRATORY_TRACT

## 2014-11-27 NOTE — Assessment & Plan Note (Signed)
CT chest with biapical nodules favor scarring, 10 mm in the right apex and 13 mm in the left apex. May follow these in 6 months to 12 months, with a repeat CT.  Plan: - planning for repeat CT chest around March 2017

## 2014-11-27 NOTE — Progress Notes (Signed)
[facility] Consultation      MRN# [id number] Johnny Sanford [birth year]   CC: Chief Complaint  Patient presents with  . Follow-up    cough. pt. states breathing is baseline. occ. wheezing. denies SOB, cough and chest pain/tightness.      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 Patient states that he's had a mildly productive intermittent cough over the past 3 weeks, he recently had a upper spray tract infection and was treated with Augmentin by his primary care physician. He had a chest x-ray that showed a possible left lower lobe opacity, he endorses losing about 50 pounds in the last 1 year. And he does endorse some subjective night sweats, along with a hoarse voice. He denies dyspnea on exertion or dyspnea at rest. He admits to smoking on and off for a total of about 7 years, he last quit in 2003, he has had significant secondhand smoke exposure from his wife for 45 years.  Plan - Breo, CT Chest, PFTs, 6MWT, avoid 2nd hand smoke exposure   ROV 07/04/14 Patient presents today for follow-up visit of his COPD, along with his wife. Since last visit he's had a CT scan of his chest, abdomen and pelvis, see results stated below. Current he also has had increase in the number of falls, will be following up with neurology. He had his surgery done to his eyes for squamous cell carcinoma of the left lower eyelid and a Mohs procedure of his left nasal bridge for basal cell carcinoma. Off note, wif stated that after his Mohs procedure he had to replace on oxygen due to desaturating down to the 70s. Further history today reveals that he also has essential  thrombocytosis he is on hydroxyurea, his white blood cell count for the last year has been in the mid twos.  overall patient states that he is doing fairly okay, he has a cane today for walking, he also had his pulmonary function tests and 6 minute walk test done today.   I suspect that he has recurrent bronchitis/URI due to chronic exposure to secondhand smoke but also due to having a low immune system from being on hydroxyurea, for essential thrombocytosis. A review of his chart showed that his white cell count over the last year has been about 2-2.5. Plan: - cont with Breo - cont with albuterol rescue inhaler - Avoid secondhand smoke exposure  Events since last clinic visit: Patient presents today for follow-up visit of recurrent cough, and frequent URIs. Patient is accompanied today by his wife. States that since his last visit he's been doing fairly okay, denies any shortness of breath, but does endorse wheezing today. Recently seen by ENT, started on Bactrim for sinus infection.     Medication:   Current Outpatient Rx  Name  Route  Sig  Dispense  Refill  . albuterol (PROVENTIL HFA;VENTOLIN HFA) 108 (90 BASE) MCG/ACT inhaler   Inhalation   Inhale 2 puffs into the lungs every 6 (six) hours as needed for wheezing or shortness of breath.   1 Inhaler   0   . aspirin 81 MG EC tablet   Oral   Take 162 mg by mouth daily.          Marland Kitchen  clonazePAM (KLONOPIN) 0.5 MG tablet   Oral   Take 1 tablet (0.5 mg total) by mouth 3 (three) times daily as needed for anxiety.   90 tablet   3   . Cyanocobalamin (VITAMIN B-12 PO)   Oral   Take by mouth daily.         Marland Kitchen escitalopram (LEXAPRO) 20 MG tablet   Oral   Take 1 tablet (20 mg total) by mouth daily.   30 tablet   2   . fludrocortisone (FLORINEF) 0.1 MG tablet   Oral   Take 1 tablet (0.1 mg total) by mouth daily.   30 tablet   5   . fluorouracil (EFUDEX) 5 % cream   Topical   Apply topically as needed.   40 g   1   .  fluticasone (FLONASE) 50 MCG/ACT nasal spray   Each Nare   Place 1 spray into both nostrils daily as needed for rhinitis.   16 g   6   . Fluticasone Furoate-Vilanterol (BREO ELLIPTA) 200-25 MCG/INH AEPB   Inhalation   Inhale 200 mcg into the lungs daily.   1 each   6   . hydrALAZINE (APRESOLINE) 25 MG tablet   Oral   Take 25 mg by mouth 3 (three) times daily. Use as needed for BP >160/100         . hydroxyurea (HYDREA) 500 MG capsule      Take 3 capsules daily. May take with food to minimize GI side effects.         Marland Kitchen levothyroxine (SYNTHROID, LEVOTHROID) 137 MCG tablet   Oral   Take 1 tablet (137 mcg total) by mouth daily.   90 tablet   3   . meclizine (ANTIVERT) 25 MG tablet   Oral   Take 25 mg by mouth 2 (two) times daily as needed.          . mometasone (ELOCON) 0.1 % lotion      as needed.          . nystatin (MYCOSTATIN) 100000 UNIT/ML suspension               . OMEGA-3 1000 MG CAPS   Oral   Take 1 capsule by mouth daily.           Marland Kitchen omeprazole (PRILOSEC) 20 MG capsule   Oral   Take 1 capsule (20 mg total) by mouth daily.   90 capsule   3   . sulfamethoxazole-trimethoprim (BACTRIM DS,SEPTRA DS) 800-160 MG per tablet               . traMADol (ULTRAM) 50 MG tablet   Oral   Take 1 tablet (50 mg total) by mouth every 8 (eight) hours as needed.   90 tablet   3   . traZODone (DESYREL) 50 MG tablet   Oral   Take 0.5-1 tablets (25-50 mg total) by mouth at bedtime as needed for sleep.   30 tablet   2      Review of Systems  Constitutional: Negative for fever and chills.  HENT: Positive for congestion. Negative for sore throat.   Eyes: Negative for pain.  Respiratory: Positive for wheezing. Negative for hemoptysis, sputum production and stridor.   Cardiovascular: Negative for chest pain and palpitations.  Gastrointestinal: Negative for heartburn and nausea.  Genitourinary: Negative for dysuria.  Musculoskeletal: Negative for  myalgias.  Skin: Negative for rash.  Neurological: Negative for dizziness.  Endo/Heme/Allergies: Does not bruise/bleed easily.  Allergies:  Simvastatin  Physical Examination:  VS: BP 144/80 mmHg  Pulse 79  Ht 5' 3.5" (1.613 m)  Wt 152 lb 3.2 oz (69.037 kg)  BMI 26.53 kg/m2  SpO2 90%  General Appearance: No distress  HEENT: PERRLA, no ptosis, no other lesions noticed Pulmonary: PreBD - diffuse fine  Expiratory wheezes, coarse upper airway sounds PostBD -  Cardiovascular:  Normal S1,S2.  No m/r/g.     Abdomen:Exam: Benign, Soft, non-tender, No masses  Skin:   warm, no rashes, no ecchymosis  Extremities: normal, no cyanosis, clubbing, warm with normal capillary refill.       Assessment and Plan: COPD (chronic obstructive pulmonary disease) Patient with clinical history significant for second hand smoke exposure along with a mild smoking history in the past. His PFTs shows moderate obstruction. I have advised him to continue using Breo and as need albuterol. Still has exposure to secondhand smoke from his wife. I have also counseled his wife on effects of secondhand smoke and its more toxic effects to the patient. CT chest with biapical nodules favor scarring, 10 mm in the right apex and 13 mm in the left apex. May follow these in 6 months to 12 months, with a repeat CT. Wife is also stated today that BREO cost them about $45 out of pocket, they can afford this currently; however she is not sure how long she can afford $45 a month for a bronchodilation, I have advised her dyspnea becomes more of an issue that we can try Brovana nebulizers; patient did not qualify for Breo assistance due to having medicare, but wife is working with insurance company to get some sort of assistance since this med has helped him the most.   I suspect that he has recurrent bronchitis/URI due to chronic exposure to secondhand smoke but also due to having a low immune system from being on  hydroxyurea, for essential thrombocytosis. Patient got one duoneb today with significant improvement in his wheezing.  Plan: - cont with Breo - cont with albuterol rescue inhaler - Avoid secondhand smoke exposure - cont with ENT follow up for sinusitis.       Multiple lung nodules on CT CT chest with biapical nodules favor scarring, 10 mm in the right apex and 13 mm in the left apex. May follow these in 6 months to 12 months, with a repeat CT.  Plan: - planning for repeat CT chest around March 2017    Updated Medication List Outpatient Encounter Prescriptions as of 11/27/2014  Medication Sig  . aspirin 81 MG EC tablet Take 162 mg by mouth daily.   . clonazePAM (KLONOPIN) 0.5 MG tablet Take 1 tablet (0.5 mg total) by mouth 3 (three) times daily as needed for anxiety.  . Cyanocobalamin (VITAMIN B-12 PO) Take by mouth daily.  Marland Kitchen escitalopram (LEXAPRO) 20 MG tablet Take 1 tablet (20 mg total) by mouth daily.  . fludrocortisone (FLORINEF) 0.1 MG tablet Take 1 tablet (0.1 mg total) by mouth daily.  . fluorouracil (EFUDEX) 5 % cream Apply topically as needed.  . fluticasone (FLONASE) 50 MCG/ACT nasal spray Place 1 spray into both nostrils daily as needed for rhinitis.  . Fluticasone Furoate-Vilanterol (BREO ELLIPTA) 200-25 MCG/INH AEPB Inhale 200 mcg into the lungs daily.  . hydroxyurea (HYDREA) 500 MG capsule Take 3 capsules daily. May take with food to minimize GI side effects.  Marland Kitchen levothyroxine (SYNTHROID, LEVOTHROID) 137 MCG tablet Take 1 tablet (137 mcg total) by mouth daily.  . meclizine (ANTIVERT)  25 MG tablet Take 25 mg by mouth 2 (two) times daily as needed.   . mometasone (ELOCON) 0.1 % lotion as needed.   . nystatin (MYCOSTATIN) 100000 UNIT/ML suspension   . OMEGA-3 1000 MG CAPS Take 1 capsule by mouth daily.    Marland Kitchen omeprazole (PRILOSEC) 20 MG capsule Take 1 capsule (20 mg total) by mouth daily.  Marland Kitchen sulfamethoxazole-trimethoprim (BACTRIM DS,SEPTRA DS) 800-160 MG per tablet   .  traMADol (ULTRAM) 50 MG tablet Take 1 tablet (50 mg total) by mouth every 8 (eight) hours as needed.  . traZODone (DESYREL) 50 MG tablet Take 0.5-1 tablets (25-50 mg total) by mouth at bedtime as needed for sleep.  Marland Kitchen Fluticasone Furoate-Vilanterol (BREO ELLIPTA) 200-25 MCG/INH AEPB Inhale 1 puff into the lungs daily.  . [DISCONTINUED] albuterol (PROVENTIL HFA;VENTOLIN HFA) 108 (90 BASE) MCG/ACT inhaler Inhale 2 puffs into the lungs every 6 (six) hours as needed for wheezing or shortness of breath. (Patient not taking: Reported on 11/27/2014)  . [DISCONTINUED] hydrALAZINE (APRESOLINE) 25 MG tablet Take 25 mg by mouth 3 (three) times daily. Use as needed for BP >160/100  . [EXPIRED] ipratropium-albuterol (DUONEB) 0.5-2.5 (3) MG/3ML nebulizer solution 3 mL    No facility-administered encounter medications on file as of 11/27/2014.    Orders for this visit: No orders of the defined types were placed in this encounter.    Thank  you for the visitation and for allowing  [facility] to assist in the care of your patient. Our recommendations are noted above.  Please contact us if we can be of further service.  Vilinda Boehringer, MD [facility] Office Number: [phone]  Note: This note was prepared with Dragon dictation along with smaller phrase technology. Any transcriptional errors that result from this process are unintentional.

## 2014-11-27 NOTE — Assessment & Plan Note (Signed)
Patient with clinical history significant for second hand smoke exposure along with a mild smoking history in the past. His PFTs shows moderate obstruction. I have advised him to continue using Breo and as need albuterol. Still has exposure to secondhand smoke from his wife. I have also counseled his wife on effects of secondhand smoke and its more toxic effects to the patient. CT chest with biapical nodules favor scarring, 10 mm in the right apex and 13 mm in the left apex. May follow these in 6 months to 12 months, with a repeat CT. Wife is also stated today that BREO cost them about $45 out of pocket, they can afford this currently; however she is not sure how long she can afford $45 a month for a bronchodilation, I have advised her dyspnea becomes more of an issue that we can try Brovana nebulizers; patient did not qualify for Breo assistance due to having medicare, but wife is working with insurance company to get some sort of assistance since this med has helped him the most.   I suspect that he has recurrent bronchitis/URI due to chronic exposure to secondhand smoke but also due to having a low immune system from being on hydroxyurea, for essential thrombocytosis. Patient got one duoneb today with significant improvement in his wheezing.  Plan: - cont with Breo - cont with albuterol rescue inhaler - Avoid secondhand smoke exposure - cont with ENT follow up for sinusitis.

## 2014-11-27 NOTE — Patient Instructions (Signed)
Follow up with Dr. Stevenson Clinch in: 2-3 months - cont with Breo - cont with recs per ENT for sinusitis - as needed albuterol - avoid 2nd hand smoke

## 2014-11-28 DIAGNOSIS — H6123 Impacted cerumen, bilateral: Secondary | ICD-10-CM | POA: Diagnosis not present

## 2014-12-01 NOTE — Progress Notes (Signed)
[facility]  [phone] [phone]) [phone]  ID: Johnny Sanford OB: 1940-07-11  MR#: [id number]  AOZ#:[id number]  Patient Care Team: Jackolyn Confer, MD as PCP - General (Internal Medicine)  CHIEF COMPLAINT:  Chief Complaint  Patient presents with  . OTHER    thrombocythemia, follow up    INTERVAL HISTORY: Patient returns to clinic today for repeat laboratory work and further evaluation. He is tolerating 1500 mg Hydrea daily without significant side effects. He continues to have multiple medical complaints all of which are chronic and unchanged. He denies any easy bleeding or bruising.  He denies any recent fevers. He has a good appetite, but is complaining of continual weigh loss.  He denies any chest pain or shortness of breath. He denies any pain. He has no nausea, vomiting, constipation, or diarrhea.  He offers no further specific complaints today.  REVIEW OF SYSTEMS:   Review of Systems  Constitutional: Positive for weight loss.  Respiratory: Positive for shortness of breath.   Cardiovascular: Negative.   Gastrointestinal: Negative.   Neurological: Positive for sensory change.    As per HPI. Otherwise, a complete review of systems is negatve.  PAST MEDICAL HISTORY: Past Medical History  Diagnosis Date  . HLD (hyperlipidemia)   . HTN (hypertension)   . GERD (gastroesophageal reflux disease)   . Hypothyroidism   . Cancer ([place]) 1991    sqauamous cell ca  . CVA (cerebral vascular accident) ([place])   . DA (degenerative arthritis)   . RA (rheumatoid arthritis) ([place])     PAST SURGICAL HISTORY: Past Surgical History  Procedure Laterality Date  . Carotid endarterectomy    . Skin lesion excision      Dr. Sarajane Jews, and Dr. Vickki Muff at [facility] Family History  Problem Relation Age of Onset  . Heart disease Father        ADVANCED DIRECTIVES:    HEALTH MAINTENANCE: Social History  Substance Use Topics  . Smoking status:  Former Smoker -- 15 years    Types: Cigarettes    Quit date: 10/07/2001  . Smokeless tobacco: Never Used     Comment: tobacco use - no  . Alcohol Use: No     Colonoscopy:  PAP:  Bone density:  Lipid panel:  Allergies  Allergen Reactions  . Simvastatin     Anxiety, gait instability    Current Outpatient Prescriptions  Medication Sig Dispense Refill  . aspirin 81 MG EC tablet Take 162 mg by mouth daily.     . clonazePAM (KLONOPIN) 0.5 MG tablet Take 1 tablet (0.5 mg total) by mouth 3 (three) times daily as needed for anxiety. 90 tablet 3  . Cyanocobalamin (VITAMIN B-12 PO) Take by mouth daily.    Marland Kitchen escitalopram (LEXAPRO) 20 MG tablet Take 1 tablet (20 mg total) by mouth daily. 30 tablet 2  . fludrocortisone (FLORINEF) 0.1 MG tablet Take 1 tablet (0.1 mg total) by mouth daily. 30 tablet 5  . fluorouracil (EFUDEX) 5 % cream Apply topically as needed. 40 g 1  . fluticasone (FLONASE) 50 MCG/ACT nasal spray Place 1 spray into both nostrils daily as needed for rhinitis. 16 g 6  . Fluticasone Furoate-Vilanterol (BREO ELLIPTA) 200-25 MCG/INH AEPB Inhale 200 mcg into the lungs daily. 1 each 6  . hydroxyurea (HYDREA) 500 MG capsule Take 3 capsules daily. May take with food to minimize GI side effects.    Marland Kitchen levothyroxine (SYNTHROID, LEVOTHROID) 137 MCG tablet Take 1 tablet (137  mcg total) by mouth daily. 90 tablet 3  . meclizine (ANTIVERT) 25 MG tablet Take 25 mg by mouth 2 (two) times daily as needed.     . mometasone (ELOCON) 0.1 % lotion as needed.     . nystatin (MYCOSTATIN) 100000 UNIT/ML suspension     . OMEGA-3 1000 MG CAPS Take 1 capsule by mouth daily.      Marland Kitchen omeprazole (PRILOSEC) 20 MG capsule Take 1 capsule (20 mg total) by mouth daily. 90 capsule 3  . sulfamethoxazole-trimethoprim (BACTRIM DS,SEPTRA DS) 800-160 MG per tablet     . traMADol (ULTRAM) 50 MG tablet Take 1 tablet (50 mg total) by mouth every 8 (eight) hours as needed. 90 tablet 3  . traZODone (DESYREL) 50 MG tablet  Take 0.5-1 tablets (25-50 mg total) by mouth at bedtime as needed for sleep. 30 tablet 2  . Fluticasone Furoate-Vilanterol (BREO ELLIPTA) 200-25 MCG/INH AEPB Inhale 1 puff into the lungs daily. 60 each 5   No current facility-administered medications for this visit.    OBJECTIVE: Filed Vitals:   11/17/14 1159  BP: 175/96  Pulse: 74  Temp: 97.6 F (36.4 C)  Resp: 20     Body mass index is 25.13 kg/(m^2).    ECOG FS:2 - Symptomatic, <50% confined to bed  General: Well-developed, well-nourished, no acute distress. Eyes: anicteric sclera. Lungs: Clear to auscultation bilaterally. Heart: Regular rate and rhythm. No rubs, murmurs, or gallops. Abdomen: Soft, nontender, nondistended. No organomegaly noted, normoactive bowel sounds. Musculoskeletal: Joint disformity noted secondary to underlying arthritis. Neuro: Alert, answering all questions appropriately. Cranial nerves grossly intact. Skin: No rashes or petechiae noted. Psych: Normal affect.   LAB RESULTS:  Lab Results  Component Value Date   NA 127* 10/05/2014   K 4.8 10/05/2014   CL 90* 10/05/2014   CO2 31 10/05/2014   GLUCOSE 81 10/05/2014   BUN 18 10/05/2014   CREATININE 0.73 10/05/2014   CALCIUM 8.7 10/05/2014   PROT 6.7 10/05/2014   ALBUMIN 4.0 10/05/2014   AST 20 10/05/2014   ALT 13 10/05/2014   ALKPHOS 48 10/05/2014   BILITOT 0.4 10/05/2014   GFRNONAA 94 03/17/2014   GFRAA 108 03/17/2014    Lab Results  Component Value Date   WBC 3.2* 11/17/2014   NEUTROABS 2.0 11/17/2014   HGB 10.0* 11/17/2014   HCT 29.2* 11/17/2014   MCV 123.3* 11/17/2014   PLT 495* 11/17/2014     STUDIES: No results found.  ASSESSMENT: Essential thrombocytosis.   PLAN:    1.  ET: Previously, bone marrow biopsy consistent with essential thrombocytosis.  Patient's platelet count is slightly elevated today, but is unclear of patient's compliance with Hydrea. Continue current dose of Hydrea at 1500 mg. Patient's white blood cell  count is only mildly decreased.  No intervention or changes in his dosing are needed at this time. Return to clinic in 3 months with repeat laboratory work and then in 6 months with repeat laboratory work and further evaluation. Previously, ultrasound of the abdomen did not reveal any splenomegaly.   2. Anemia: Chronic. Previously iron stores were within normal limits. Possibly secondary to Hydrea. No intervention is needed at this time.  Patient expressed understanding and was in agreement with this plan. He also understands that He can call clinic at any time with any questions, concerns, or complaints.    Lloyd Huger, MD   12/01/2014 12:16 PM

## 2014-12-04 ENCOUNTER — Encounter: Payer: Self-pay | Admitting: Internal Medicine

## 2014-12-04 ENCOUNTER — Ambulatory Visit (INDEPENDENT_AMBULATORY_CARE_PROVIDER_SITE_OTHER): Payer: Medicare Other | Admitting: Internal Medicine

## 2014-12-04 VITALS — BP 150/84 | HR 78 | Temp 98.3°F | Ht 64.0 in | Wt 151.0 lb

## 2014-12-04 DIAGNOSIS — D473 Essential (hemorrhagic) thrombocythemia: Secondary | ICD-10-CM

## 2014-12-04 DIAGNOSIS — R5382 Chronic fatigue, unspecified: Secondary | ICD-10-CM

## 2014-12-04 DIAGNOSIS — J411 Mucopurulent chronic bronchitis: Secondary | ICD-10-CM

## 2014-12-04 LAB — COMPREHENSIVE METABOLIC PANEL
ALT: 11 U/L (ref 0–53)
AST: 18 U/L (ref 0–37)
Albumin: 3.6 g/dL (ref 3.5–5.2)
Alkaline Phosphatase: 54 U/L (ref 39–117)
BUN: 16 mg/dL (ref 6–23)
CALCIUM: 8.7 mg/dL (ref 8.4–10.5)
CO2: 33 meq/L — AB (ref 19–32)
CREATININE: 0.71 mg/dL (ref 0.40–1.50)
Chloride: 89 mEq/L — ABNORMAL LOW (ref 96–112)
GFR: 115.46 mL/min (ref >60.00)
Glucose, Bld: 100 mg/dL — ABNORMAL HIGH (ref 70–99)
Potassium: 4.4 mEq/L (ref 3.5–5.1)
SODIUM: 130 meq/L — AB (ref 135–145)
Total Bilirubin: 0.5 mg/dL (ref 0.2–1.2)
Total Protein: 6.4 g/dL (ref 6.0–8.3)

## 2014-12-04 LAB — TSH: TSH: 1.32 u[IU]/mL (ref 0.35–4.50)

## 2014-12-04 LAB — VITAMIN B12: Vitamin B-12: 903 pg/mL (ref 211–911)

## 2014-12-04 LAB — CBC WITH DIFFERENTIAL/PLATELET
Basophils Absolute: 0 10*3/uL (ref 0.0–0.1)
Basophils Relative: 0.2 % (ref 0.0–3.0)
Eosinophils Absolute: 0 10*3/uL (ref 0.0–0.7)
Eosinophils Relative: 0.9 % (ref 0.0–5.0)
HEMATOCRIT: 30.2 % — AB (ref 39.0–52.0)
HEMOGLOBIN: 9.9 g/dL — AB (ref 13.0–17.0)
LYMPHS PCT: 16.6 % (ref 12.0–46.0)
Lymphs Abs: 0.7 10*3/uL (ref 0.7–4.0)
MCHC: 32.8 g/dL (ref 30.0–36.0)
MCV: 125.6 fl — ABNORMAL HIGH (ref 78.0–100.0)
Monocytes Absolute: 0.4 10*3/uL (ref 0.1–1.0)
Monocytes Relative: 10.6 % (ref 3.0–12.0)
Neutro Abs: 3 10*3/uL (ref 1.4–7.7)
Neutrophils Relative %: 71.7 % (ref 43.0–77.0)
Platelets: 453 10*3/uL — ABNORMAL HIGH (ref 150.0–400.0)
RBC: 2.4 Mil/uL — ABNORMAL LOW (ref 4.22–5.81)
RDW: 17.6 % — ABNORMAL HIGH (ref 11.5–15.5)
WBC: 4.2 10*3/uL (ref 4.0–10.5)

## 2014-12-04 NOTE — Patient Instructions (Addendum)
Labs today.  Follow up with Dr. Eddie Dibbles to discuss treatment with steroids.

## 2014-12-04 NOTE — Progress Notes (Signed)
Subjective:    Patient ID: Johnny Sanford, male    DOB: [birth year], 74 y.o.   MRN: [id number]  HPI  [name] presents for follow up.  Recently seen by both pulmonology and oncology. Treated for acute bronchitis by pulmonologist. Concerns noted about ongoing exposure to second hand smoke. Also following several pulmonary nodules. No changes made to medications by hematology.  Wife is concerned about progressive weakness, fatigue, and weight loss. She is concerned that his current medical issues were triggered by a medication reaction that occurred a couple of years ago. She reports his appetite is good. They are not able to continue with PT to help with strengthening.   Wt Readings from Last 3 Encounters:  12/04/14 151 lb (68.493 kg)  11/27/14 152 lb 3.2 oz (69.037 kg)  11/17/14 151 lb 0.2 oz (68.5 kg)   BP Readings from Last 3 Encounters:  12/04/14 150/84  11/27/14 144/80  11/17/14 175/96    Past Medical History  Diagnosis Date  . HLD (hyperlipidemia)   . HTN (hypertension)   . GERD (gastroesophageal reflux disease)   . Hypothyroidism   . 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 CVA (cerebral vascular accident) ([place])   . DA (degenerative arthritis)   . RA (rheumatoid arthritis) ([facility])    Family History  Problem Relation Age of Onset  . Heart disease Father    Past Surgical History  Procedure Laterality Date  . Carotid endarterectomy    . Skin lesion excision      Dr. Sarajane Jews, and Dr. Vickki Muff at [facility]  . Marital Status: Married    Spouse Name: N/A  . Number of Children: N/A  . Years of Education: N/A   Social History Main Topics  . Smoking status: Former Smoker -- 15 years    Types: Cigarettes    Quit date: 10/07/2001  . Smokeless tobacco: Never Used     Comment: tobacco use - no  . Alcohol Use: No  . Drug Use: No  . Sexual Activity: Not Asked   Other Topics Concern  . None   Social History Narrative   Married,  retired, gets regular exercise.       Cell # [id number]    Review of Systems  Constitutional: Positive for fatigue and unexpected weight change. Negative for fever, chills, activity change and appetite change.  Eyes: Negative for visual disturbance.  Respiratory: Positive for cough. Negative for chest tightness, shortness of breath and wheezing.   Cardiovascular: Negative for chest pain, palpitations and leg swelling.  Gastrointestinal: Negative for vomiting, abdominal pain, diarrhea, constipation and abdominal distention.  Genitourinary: Negative for dysuria, urgency and difficulty urinating.  Musculoskeletal: Negative for myalgias, arthralgias and gait problem.  Skin: Negative for color change and rash.  Neurological: Positive for weakness.  Hematological: Negative for adenopathy.  Psychiatric/Behavioral: Negative for sleep disturbance and dysphoric mood. The patient is not nervous/anxious.        Objective:    BP 150/84 mmHg  Pulse 78  Temp(Src) 98.3 F (36.8 C) (Oral)  Ht 5\' 4"  (1.626 m)  Wt 151 lb (68.493 kg)  BMI 25.91 kg/m2  SpO2 95% Physical Exam  Constitutional: He is oriented to person, place, and time. He appears well-developed and well-nourished. He has a sickly appearance. No distress.  Pale, chronically ill appearing  HENT:  Head: Normocephalic and atraumatic.  Right Ear: External ear normal.  Left Ear: External ear normal.  Nose: Nose normal.  Mouth/Throat: Oropharynx is clear and moist. No oropharyngeal exudate.  Eyes: Conjunctivae and EOM are normal. Pupils are equal, round, and reactive to light. Right eye exhibits no discharge. Left eye exhibits no discharge. No scleral icterus.  Neck: Normal range of motion. Neck supple. No tracheal deviation present. No thyromegaly present.  Cardiovascular: Normal rate, regular rhythm and normal heart sounds.  Exam reveals no gallop and no friction rub.   No murmur heard. Pulmonary/Chest: Effort normal. No accessory  muscle usage. No tachypnea. No respiratory distress. He has decreased breath sounds (prolonged expiration). He has no wheezes. He has rhonchi (diffuse rhonchi). He has no rales. He exhibits no tenderness.  Musculoskeletal: Normal range of motion. He exhibits no edema.  Lymphadenopathy:    He has no cervical adenopathy.  Neurological: He is alert and oriented to person, place, and time. No cranial nerve deficit. Coordination normal.  Skin: Skin is warm and dry. No rash noted. He is not diaphoretic. No erythema. No pallor.  Psychiatric: He has a normal mood and affect. His behavior is normal. Judgment and thought content normal.          Assessment & Plan:   Problem List Items Addressed This Visit      Unprioritized   COPD (chronic obstructive pulmonary disease) ([place])    Reviewed notes from pulmonary. Continue inhaled bronchodilators. Avoid second hand smoke exposure.       Essential thrombocythemia [facility])    Reviewed notes from hematology. Continue Hydrea.      Fatigue - Primary    Chronic fatigue, weight loss, weakness. We discussed that his multiple chronic medical issues including COPD, ET, and adrenal insufficiency are likely contributing. There may be additional underlying causes as well. He was noted to have multiple pulmonary nodules, however these were stable on recent CT, plan for follow up CT in 03/2015. Encouraged them to follow up with both neurology and endocrinology. Question if low dose Prednisone might be helpful, however we discussed potential risks of this. Will repeat CBC, CMP, TSH, and B12 with labs today.      Relevant Orders   CBC with Differential/Platelet   Comprehensive metabolic panel   TSH   O96       Return in about 3 months (around 03/06/2015) for Recheck.

## 2014-12-04 NOTE — Progress Notes (Signed)
Pre visit review using our clinic review tool, if applicable. No additional management support is needed unless otherwise documented below in the visit note. 

## 2014-12-04 NOTE — Assessment & Plan Note (Signed)
Reviewed notes from pulmonary. Continue inhaled bronchodilators. Avoid second hand smoke exposure.

## 2014-12-04 NOTE — Assessment & Plan Note (Signed)
Chronic fatigue, weight loss, weakness. We discussed that his multiple chronic medical issues including COPD, ET, and adrenal insufficiency are likely contributing. There may be additional underlying causes as well. He was noted to have multiple pulmonary nodules, however these were stable on recent CT, plan for follow up CT in 03/2015. Encouraged them to follow up with both neurology and endocrinology. Question if low dose Prednisone might be helpful, however we discussed potential risks of this. Will repeat CBC, CMP, TSH, and B12 with labs today.

## 2014-12-04 NOTE — Assessment & Plan Note (Signed)
Reviewed notes from hematology. Continue Hydrea.

## 2014-12-05 DIAGNOSIS — R29898 Other symptoms and signs involving the musculoskeletal system: Secondary | ICD-10-CM | POA: Diagnosis not present

## 2014-12-05 DIAGNOSIS — R634 Abnormal weight loss: Secondary | ICD-10-CM | POA: Diagnosis not present

## 2014-12-07 DIAGNOSIS — R634 Abnormal weight loss: Secondary | ICD-10-CM | POA: Diagnosis not present

## 2014-12-07 DIAGNOSIS — R29898 Other symptoms and signs involving the musculoskeletal system: Secondary | ICD-10-CM | POA: Diagnosis not present

## 2014-12-25 ENCOUNTER — Other Ambulatory Visit: Payer: Self-pay | Admitting: Internal Medicine

## 2014-12-29 ENCOUNTER — Other Ambulatory Visit: Payer: Self-pay | Admitting: Otolaryngology

## 2014-12-29 ENCOUNTER — Other Ambulatory Visit: Payer: Self-pay | Admitting: Nurse Practitioner

## 2014-12-29 ENCOUNTER — Telehealth: Payer: Self-pay | Admitting: Internal Medicine

## 2014-12-29 DIAGNOSIS — H6121 Impacted cerumen, right ear: Secondary | ICD-10-CM | POA: Diagnosis not present

## 2014-12-29 DIAGNOSIS — R42 Dizziness and giddiness: Secondary | ICD-10-CM

## 2014-12-29 DIAGNOSIS — M542 Cervicalgia: Secondary | ICD-10-CM

## 2014-12-29 DIAGNOSIS — H6061 Unspecified chronic otitis externa, right ear: Secondary | ICD-10-CM | POA: Diagnosis not present

## 2014-12-29 DIAGNOSIS — E871 Hypo-osmolality and hyponatremia: Secondary | ICD-10-CM

## 2014-12-29 NOTE — Telephone Encounter (Signed)
Patient was called due to not understanding what the call was about he will be getting his wife to callback.

## 2014-12-29 NOTE — Telephone Encounter (Signed)
appt for Monday at 1145

## 2014-12-29 NOTE — Telephone Encounter (Signed)
Pt wife called about pt needing a creatine level lab work before he gets a MRI on 01/03/2015. Need order please and thank you!

## 2014-12-29 NOTE — Telephone Encounter (Signed)
Please send when the labs are received. Thank you.

## 2014-12-29 NOTE — Telephone Encounter (Signed)
Lab results where requested to be faxed to [place].

## 2015-01-01 ENCOUNTER — Other Ambulatory Visit: Payer: Medicare Other

## 2015-01-01 ENCOUNTER — Telehealth: Payer: Self-pay | Admitting: *Deleted

## 2015-01-01 NOTE — Telephone Encounter (Signed)
Johnny Sanford from [facility] has requested lab results from 11/07 be faxed over to her at [phone]. Attn Johnny Sanford

## 2015-01-01 NOTE — Telephone Encounter (Signed)
Completed request, faxed to [facility].

## 2015-01-02 ENCOUNTER — Encounter: Payer: Self-pay | Admitting: Internal Medicine

## 2015-01-03 DIAGNOSIS — R5383 Other fatigue: Secondary | ICD-10-CM | POA: Diagnosis not present

## 2015-01-03 DIAGNOSIS — R9082 White matter disease, unspecified: Secondary | ICD-10-CM | POA: Diagnosis not present

## 2015-01-03 DIAGNOSIS — M47892 Other spondylosis, cervical region: Secondary | ICD-10-CM | POA: Diagnosis not present

## 2015-01-03 DIAGNOSIS — M50222 Other cervical disc displacement at C5-C6 level: Secondary | ICD-10-CM | POA: Diagnosis not present

## 2015-01-03 DIAGNOSIS — G936 Cerebral edema: Secondary | ICD-10-CM | POA: Diagnosis not present

## 2015-01-03 DIAGNOSIS — M47812 Spondylosis without myelopathy or radiculopathy, cervical region: Secondary | ICD-10-CM | POA: Diagnosis not present

## 2015-01-03 DIAGNOSIS — G319 Degenerative disease of nervous system, unspecified: Secondary | ICD-10-CM | POA: Diagnosis not present

## 2015-01-03 DIAGNOSIS — M4802 Spinal stenosis, cervical region: Secondary | ICD-10-CM | POA: Diagnosis not present

## 2015-01-03 DIAGNOSIS — R42 Dizziness and giddiness: Secondary | ICD-10-CM | POA: Diagnosis not present

## 2015-01-03 DIAGNOSIS — Z9181 History of falling: Secondary | ICD-10-CM | POA: Diagnosis not present

## 2015-01-03 NOTE — Telephone Encounter (Signed)
Labs were faxed on Monday. This was sent to the wrong person.

## 2015-01-05 DIAGNOSIS — R42 Dizziness and giddiness: Secondary | ICD-10-CM | POA: Diagnosis not present

## 2015-01-05 DIAGNOSIS — G936 Cerebral edema: Secondary | ICD-10-CM | POA: Diagnosis not present

## 2015-01-05 DIAGNOSIS — M47892 Other spondylosis, cervical region: Secondary | ICD-10-CM | POA: Diagnosis not present

## 2015-01-05 DIAGNOSIS — Z9181 History of falling: Secondary | ICD-10-CM | POA: Diagnosis not present

## 2015-01-05 DIAGNOSIS — R5383 Other fatigue: Secondary | ICD-10-CM | POA: Diagnosis not present

## 2015-01-08 ENCOUNTER — Ambulatory Visit (INDEPENDENT_AMBULATORY_CARE_PROVIDER_SITE_OTHER): Payer: Medicare Other | Admitting: Internal Medicine

## 2015-01-08 ENCOUNTER — Encounter: Payer: Self-pay | Admitting: Internal Medicine

## 2015-01-08 VITALS — BP 90/58 | HR 81 | Temp 98.6°F | Ht 64.0 in | Wt 149.5 lb

## 2015-01-08 DIAGNOSIS — D473 Essential (hemorrhagic) thrombocythemia: Secondary | ICD-10-CM | POA: Diagnosis not present

## 2015-01-08 DIAGNOSIS — I10 Essential (primary) hypertension: Secondary | ICD-10-CM

## 2015-01-08 DIAGNOSIS — E871 Hypo-osmolality and hyponatremia: Secondary | ICD-10-CM | POA: Diagnosis not present

## 2015-01-08 DIAGNOSIS — R5382 Chronic fatigue, unspecified: Secondary | ICD-10-CM

## 2015-01-08 DIAGNOSIS — F411 Generalized anxiety disorder: Secondary | ICD-10-CM

## 2015-01-08 DIAGNOSIS — M4802 Spinal stenosis, cervical region: Secondary | ICD-10-CM

## 2015-01-08 DIAGNOSIS — R0989 Other specified symptoms and signs involving the circulatory and respiratory systems: Secondary | ICD-10-CM

## 2015-01-08 MED ORDER — CLONAZEPAM 0.5 MG PO TABS: 0.5000 mg | ORAL_TABLET | Freq: Three times a day (TID) | ORAL | Status: DC | PRN

## 2015-01-08 NOTE — Assessment & Plan Note (Signed)
Labile hypertension. Discussed increasing Florinef dosing, however will defer to endocrinology and cardiology. Follow up pending. 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

## 2015-01-08 NOTE — Progress Notes (Signed)
Subjective:    Patient ID: Johnny Sanford, male    DOB: 1940-04-07, 74 y.o.   MRN: [id number]  HPI  [name] presents for follow up.  Recently had MRI cervical spine which showed narrowing at C2-3 and MRI brain showed no acute changes. Planning to see neurosurgery to see if any intervention possible. Also has follow up with neurology, Dr. Manuella Ghazi. Having aching pain in neck and notes generalized weakness in arms. Occasional numbness in left hand. Using Tylenol for pain with some improvement.  Also have some diffuse headaches across forehead.  BP - BP has been variable. 102/80s this morning. BP trends up in the afternoon. Taking Florinef. Occasional high readings in [id number] systolic.  Wt Readings from Last 3 Encounters:  01/08/15 149 lb 8 oz (67.813 kg)  12/04/14 151 lb (68.493 kg)  11/27/14 152 lb 3.2 oz (69.037 kg)   BP Readings from Last 3 Encounters:  01/08/15 90/58  12/04/14 150/84  11/27/14 144/80    Past Medical History  Diagnosis Date  . HLD (hyperlipidemia)   . HTN (hypertension)   . GERD (gastroesophageal reflux disease)   . Hypothyroidism   . 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 CVA (cerebral vascular accident) ([place])   . DA (degenerative arthritis)   . RA (rheumatoid arthritis) (H[facility])    Family History  Problem Relation Age of Onset  . Heart disease Father    Past Surgical History  Procedure Laterality Date  . Carotid endarterectomy    . Skin lesion excision      Dr. Sarajane Jews, and Dr. Vickki Muff at [place] [facility]  . Marital Status: Married    Spouse Name: N/A  . Number of Children: N/A  . Years of Education: N/A   Social History Main Topics  . Smoking status: Former Smoker -- 15 years    Types: Cigarettes    Quit date: 10/07/2001  . Smokeless tobacco: Never Used     Comment: tobacco use - no  . Alcohol Use: No  . Drug Use: No  . Sexual Activity: Not Asked   Other Topics Concern  . None   Social History Narrative   Married, retired, gets regular exercise.       Cell # [id number]    Review of Systems  Constitutional: Negative for fever, chills, activity change, appetite change, fatigue and unexpected weight change.  Eyes: Negative for visual disturbance.  Respiratory: Positive for cough. Negative for chest tightness and shortness of breath.   Cardiovascular: Negative for chest pain, palpitations and leg swelling.  Gastrointestinal: Negative for nausea, vomiting, abdominal pain, diarrhea, constipation and abdominal distention.  Genitourinary: Negative for dysuria, urgency and difficulty urinating.  Musculoskeletal: Positive for back pain, arthralgias and neck pain. Negative for gait problem and neck stiffness.  Skin: Negative for color change and rash.  Neurological: Positive for weakness and numbness.  Hematological: Negative for adenopathy.  Psychiatric/Behavioral: Negative for sleep disturbance and dysphoric mood. The patient is not nervous/anxious.        Objective:    BP 90/58 mmHg  Pulse 81  Temp(Src) 98.6 F (37 C) (Oral)  Ht 5\' 4"  (1.626 m)  Wt 149 lb 8 oz (67.813 kg)  BMI 25.65 kg/m2  SpO2 95% Physical Exam  Constitutional: He is oriented to person, place, and time. He appears well-developed and well-nourished. No distress.  HENT:  Head: Normocephalic and atraumatic.  Right Ear: External ear normal.  Left Ear: External  ear normal.  Nose: Nose normal.  Mouth/Throat: Oropharynx is clear and moist. No oropharyngeal exudate.  Eyes: Conjunctivae and EOM are normal. Pupils are equal, round, and reactive to light. Right eye exhibits no discharge. Left eye exhibits no discharge. No scleral icterus.  Neck: Normal range of motion. Neck supple. No tracheal deviation present. No thyromegaly present.  Cardiovascular: Normal rate, regular rhythm and normal heart sounds.  Exam reveals no gallop and no friction rub.   No murmur heard. Pulmonary/Chest: Effort normal. No accessory muscle usage. No  tachypnea. No respiratory distress. He has no decreased breath sounds. He has no wheezes. He has rhonchi (scattered). He has no rales. He exhibits no tenderness.  Musculoskeletal: Normal range of motion. He exhibits no edema.  Lymphadenopathy:    He has no cervical adenopathy.  Neurological: He is alert and oriented to person, place, and time. No cranial nerve deficit. Coordination normal.  Skin: Skin is warm and dry. No rash noted. He is not diaphoretic. No erythema. No pallor.  Psychiatric: He has a normal mood and affect. His behavior is normal. Judgment and thought content normal.          Assessment & Plan:   Problem List Items Addressed This Visit      Unprioritized   Anxiety state    Chronic anxiety. We discussed that Clonazepam may contribute to fatigue. Continue current medication.      Relevant Medications   clonazePAM (KLONOPIN) 0.5 MG tablet   Essential thrombocythemia (H[facility])    Recent platelet count stable. Will continue Hydrea. Follow up as scheduled with Dr. Grayland Ormond.      Relevant Medications   clonazePAM (KLONOPIN) 0.5 MG tablet   Fatigue    Chronic fatigue. We discussed that this is multifactorial with COPD, adrenal insufficiency, essential thrombocytosis.       Hyponatremia    Recent Na stable at 130. Will plan repeat labs next month.      Labile hypertension - Primary    Labile hypertension. Discussed increasing Florinef dosing, however will defer to endocrinology and cardiology. Follow up pending. 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      Spinal stenosis in cervical region    Reviewed MRI cervical spine with pt. Neurosurgery evaluation pending. Will follow. Continue Tylenol prn for pain.          Return in about 4 weeks (around 02/05/2015) for Recheck.

## 2015-01-08 NOTE — Assessment & Plan Note (Signed)
Chronic fatigue. We discussed that this is multifactorial with COPD, adrenal insufficiency, essential thrombocytosis.

## 2015-01-08 NOTE — Assessment & Plan Note (Signed)
Reviewed MRI cervical spine with pt. Neurosurgery evaluation pending. Will follow. Continue Tylenol prn for pain.

## 2015-01-08 NOTE — Assessment & Plan Note (Signed)
Chronic anxiety. We discussed that Clonazepam may contribute to fatigue. Continue current medication.

## 2015-01-08 NOTE — Patient Instructions (Signed)
Follow up after neurosurgery evaluation.

## 2015-01-08 NOTE — Assessment & Plan Note (Signed)
Recent platelet count stable. Will continue Hydrea. Follow up as scheduled with Dr. Grayland Ormond.

## 2015-01-08 NOTE — Progress Notes (Signed)
Pre visit review using our clinic review tool, if applicable. No additional management support is needed unless otherwise documented below in the visit note. 

## 2015-01-08 NOTE — Assessment & Plan Note (Signed)
Recent Na stable at 130. Will plan repeat labs next month.

## 2015-01-10 DIAGNOSIS — G629 Polyneuropathy, unspecified: Secondary | ICD-10-CM | POA: Diagnosis not present

## 2015-01-10 DIAGNOSIS — R531 Weakness: Secondary | ICD-10-CM | POA: Diagnosis not present

## 2015-01-10 DIAGNOSIS — G9520 Unspecified cord compression: Secondary | ICD-10-CM | POA: Diagnosis not present

## 2015-01-10 DIAGNOSIS — R9082 White matter disease, unspecified: Secondary | ICD-10-CM | POA: Diagnosis not present

## 2015-01-10 DIAGNOSIS — R42 Dizziness and giddiness: Secondary | ICD-10-CM | POA: Diagnosis not present

## 2015-01-11 ENCOUNTER — Telehealth: Payer: Self-pay | Admitting: Internal Medicine

## 2015-01-11 DIAGNOSIS — M4802 Spinal stenosis, cervical region: Secondary | ICD-10-CM | POA: Diagnosis not present

## 2015-01-11 DIAGNOSIS — Z6825 Body mass index (BMI) 25.0-25.9, adult: Secondary | ICD-10-CM | POA: Diagnosis not present

## 2015-01-11 DIAGNOSIS — M542 Cervicalgia: Secondary | ICD-10-CM | POA: Diagnosis not present

## 2015-01-11 DIAGNOSIS — R030 Elevated blood-pressure reading, without diagnosis of hypertension: Secondary | ICD-10-CM | POA: Diagnosis not present

## 2015-01-11 NOTE — Telephone Encounter (Signed)
Patient needs a pre op clearance appt asap  For surgery .  [facility] does not have anything available until January.  Scheduled with [facility] on Monday 12/19.

## 2015-01-11 NOTE — Telephone Encounter (Signed)
That is fine to setup with [facility] for clearance but I need to know who to send the office note back to. Did patient tell you who was doing the surgery?

## 2015-01-12 NOTE — Telephone Encounter (Signed)
[facility]  [address] [place] [address], [address]  [place], [place] [address]  Main: Duncan Joya Salm, MD

## 2015-01-12 NOTE — Telephone Encounter (Signed)
Will fax OV note when pt is seen.

## 2015-01-12 NOTE — Telephone Encounter (Signed)
lmov to call office with information

## 2015-01-15 ENCOUNTER — Encounter: Payer: Self-pay | Admitting: Internal Medicine

## 2015-01-15 ENCOUNTER — Ambulatory Visit (INDEPENDENT_AMBULATORY_CARE_PROVIDER_SITE_OTHER): Payer: Medicare Other | Admitting: Internal Medicine

## 2015-01-15 VITALS — BP 132/78 | HR 117 | Ht 64.0 in | Wt 153.6 lb

## 2015-01-15 DIAGNOSIS — J449 Chronic obstructive pulmonary disease, unspecified: Secondary | ICD-10-CM | POA: Diagnosis not present

## 2015-01-15 NOTE — Patient Instructions (Signed)
Chronic Obstructive Pulmonary Disease Chronic obstructive pulmonary disease (COPD) is a common lung condition in which airflow from the lungs is limited. COPD is a general term that can be used to describe many different lung problems that limit airflow, including both chronic bronchitis and emphysema. If you have COPD, your lung function will probably never return to normal, but there are measures you can take to improve lung function and make yourself feel better. CAUSES   Smoking (common).  Exposure to secondhand smoke.  Genetic problems.  Chronic inflammatory lung diseases or recurrent infections. SYMPTOMS  Shortness of breath, especially with physical activity.  Deep, persistent (chronic) cough with a large amount of thick mucus.  Wheezing.  Rapid breaths (tachypnea).  Gray or bluish discoloration (cyanosis) of the skin, especially in your fingers, toes, or lips.  Fatigue.  Weight loss.  Frequent infections or episodes when breathing symptoms become much worse (exacerbations).  Chest tightness. DIAGNOSIS Your health care provider will take a medical history and perform a physical examination to diagnose COPD. Additional tests for COPD may include:  Lung (pulmonary) function tests.  Chest X-ray.  CT scan.  Blood tests. TREATMENT  Treatment for COPD may include:  Inhaler and nebulizer medicines. These help manage the symptoms of COPD and make your breathing more comfortable.  Supplemental oxygen. Supplemental oxygen is only helpful if you have a low oxygen level in your blood.  Exercise and physical activity. These are beneficial for nearly all people with COPD.  Lung surgery or transplant.  Nutrition therapy to gain weight, if you are underweight.  Pulmonary rehabilitation. This may involve working with a team of health care providers and specialists, such as respiratory, occupational, and physical therapists. HOME CARE INSTRUCTIONS  Take all medicines  (inhaled or pills) as directed by your health care provider.  Avoid over-the-counter medicines or cough syrups that dry up your airway (such as antihistamines) and slow down the elimination of secretions unless instructed otherwise by your health care provider.  If you are a smoker, the most important thing that you can do is stop smoking. Continuing to smoke will cause further lung damage and breathing trouble. Ask your health care provider for help with quitting smoking. He or she can direct you to community resources or hospitals that provide support.  Avoid exposure to irritants such as smoke, chemicals, and fumes that aggravate your breathing.  Use oxygen therapy and pulmonary rehabilitation if directed by your health care provider. If you require home oxygen therapy, ask your health care provider whether you should purchase a pulse oximeter to measure your oxygen level at home.  Avoid contact with individuals who have a contagious illness.  Avoid extreme temperature and humidity changes.  Eat healthy foods. Eating smaller, more frequent meals and resting before meals may help you maintain your strength.  Stay active, but balance activity with periods of rest. Exercise and physical activity will help you maintain your ability to do things you want to do.  Preventing infection and hospitalization is very important when you have COPD. Make sure to receive all the vaccines your health care provider recommends, especially the pneumococcal and influenza vaccines. Ask your health care provider whether you need a pneumonia vaccine.  Learn and use relaxation techniques to manage stress.  Learn and use controlled breathing techniques as directed by your health care provider. Controlled breathing techniques include:  Pursed lip breathing. Start by breathing in (inhaling) through your nose for 1 second. Then, purse your lips as if you were   going to whistle and breathe out (exhale) through the  pursed lips for 2 seconds.  Diaphragmatic breathing. Start by putting one hand on your abdomen just above your waist. Inhale slowly through your nose. The hand on your abdomen should move out. Then purse your lips and exhale slowly. You should be able to feel the hand on your abdomen moving in as you exhale.  Learn and use controlled coughing to clear mucus from your lungs. Controlled coughing is a series of short, progressive coughs. The steps of controlled coughing are: 1. Lean your head slightly forward. 2. Breathe in deeply using diaphragmatic breathing. 3. Try to hold your breath for 3 seconds. 4. Keep your mouth slightly open while coughing twice. 5. Spit any mucus out into a tissue. 6. Rest and repeat the steps once or twice as needed. SEEK MEDICAL CARE IF:  You are coughing up more mucus than usual.  There is a change in the color or thickness of your mucus.  Your breathing is more labored than usual.  Your breathing is faster than usual. SEEK IMMEDIATE MEDICAL CARE IF:  You have shortness of breath while you are resting.  You have shortness of breath that prevents you from:  Being able to talk.  Performing your usual physical activities.  You have chest pain lasting longer than 5 minutes.  Your skin color is more cyanotic than usual.  You measure low oxygen saturations for longer than 5 minutes with a pulse oximeter. MAKE SURE YOU:  Understand these instructions.  Will watch your condition.  Will get help right away if you are not doing well or get worse.   This information is not intended to replace advice given to you by your health care provider. Make sure you discuss any questions you have with your health care provider.   Document Released: 10/23/2004 Document Revised: 02/03/2014 Document Reviewed: 09/09/2012 Elsevier Interactive Patient Education 2016 [employer]  

## 2015-01-15 NOTE — Progress Notes (Signed)
[facility] Consultation      MRN# [id number] Johnny Sanford [birth year]   CC: Chief Complaint  Patient presents with  . Follow-up    here for op clearance for spinal cord compression. pt. states prod. cough clear to yellowish in color. denies SOB, wheezing or chest pain/tightness.        HPI : Patient has no acute complaints, oxygen sats low but probably false reading Patient asymptomatic No SOB, wheezing. 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 He had a chest x-ray that showed a possible left lower lobe opacity, he endorses losing about 50 pounds in the last 1 year. And he does endorse some subjective night sweats, along with a hoarse voice. He admits to smoking on and off for a total of about 7 years, he last quit in 2003, he has had significant secondhand smoke exposure from his wife for 45 years.        Review of Systems  Constitutional: Negative for fever and chills.  HENT: Negative for congestion.   Eyes: Negative for pain.  Respiratory: Negative for hemoptysis, sputum production, shortness of breath and wheezing.   Cardiovascular: Negative for chest pain.  Musculoskeletal: Positive for neck pain.  Skin: Negative for rash.  All other systems reviewed and are negative.     Allergies:  Simvastatin  Physical Examination:  VS: BP 132/78 mmHg  Pulse 117  Ht 5\' 4"  (1.626 m)  Wt 153 lb 9.6 oz (69.673 kg)  BMI 26.35 kg/m2  SpO2 100%  General Appearance: No distress  HEENT: PERRLA, no ptosis,  no other lesions noticed, Neck brace in place Pulmonary: No wheezes, no Rhonchi, B/L BS  Cardiovascular:  Normal S1,S2.  No m/r/g.     Abdomen:Exam: Benign, Soft, non-tender, No masses  Skin:   warm, no rashes, no ecchymosis  Extremities: normal, no cyanosis, clubbing, warm with normal capillary refill.     Assessment and Plan:74 yo white male with RA with Moderate COPD which is Stable on current inhaled medications here for pre-op pulm evaluation for neck surgery  After further assessment and evaluation patient is a MILD TO MODERATE Risk for intra/post op pulmonary complications(pneumonia,resp failure,atalectasis,hypoxia)  COPD (chronic obstructive pulmonary disease)  PFTs shows moderate obstruction. 1.continue using Breo and albuterol as need  2.avoid secondhand smoke from his wife.    Multiple lung nodules on CT CT chest with biapical nodules favor scarring, 10 mm in the right apex and 13 mm in the left apex. May follow these in 6 months to 12 months, with a repeat CT. Ct chest 04/12/14 images reveiwed 01/15/2015  Plan: - planning for repeat CT chest around March 2017   Thank  you for the visitation and for allowing  [facility] to assist in the care of your patient. Our recommendations are noted above.  Please contact us if we can be of further service.  I have personally obtained a history, examined the patient, evaluated Pertinent laboratory and RadioGraphic/imaging results, and  formulated the assessment and plan   The Patient requires high complexity decision making for  assessment and support, frequent evaluation and titration of therapies.  Patient/Family are satisfied with Plan of action and management. All questions answered  Corrin Parker, M.D.  [facility]  Medical Director [place] Director [facility]

## 2015-01-16 NOTE — Telephone Encounter (Signed)
Called surgeons office and LM for surgery scheduler Janett Billow to give me a call back to confirm she did get them OV notes that were faxed this am.

## 2015-01-16 NOTE — Telephone Encounter (Signed)
OV note faxed to [phone].

## 2015-01-16 NOTE — Telephone Encounter (Signed)
Patient wife says office did not receive clearance letter.

## 2015-01-16 NOTE — Telephone Encounter (Signed)
LM on VM informing wife that I spoke with [place] at the surgeons office and that she can call  Them with any further questions.

## 2015-01-18 ENCOUNTER — Ambulatory Visit: Payer: Medicare Other

## 2015-01-18 ENCOUNTER — Telehealth: Payer: Self-pay | Admitting: *Deleted

## 2015-01-18 ENCOUNTER — Other Ambulatory Visit: Payer: Medicare Other

## 2015-01-18 NOTE — Telephone Encounter (Signed)
Spoke with wife and informed her that she can call and speak with Janett Billow the surgery scheduler at the Neurosurgeons office. She received the paperwork. Nothing further needed.

## 2015-01-18 NOTE — Telephone Encounter (Signed)
Pt wife calling states they called the [place] office and they still have not received the clearance from [place].  It needs to be resent to Dr Leeroy Cha office.  Please advise.

## 2015-01-25 DIAGNOSIS — H6123 Impacted cerumen, bilateral: Secondary | ICD-10-CM | POA: Diagnosis not present

## 2015-01-25 DIAGNOSIS — H6063 Unspecified chronic otitis externa, bilateral: Secondary | ICD-10-CM | POA: Diagnosis not present

## 2015-01-25 DIAGNOSIS — R42 Dizziness and giddiness: Secondary | ICD-10-CM | POA: Diagnosis not present

## 2015-02-02 ENCOUNTER — Encounter: Payer: Self-pay | Admitting: Emergency Medicine

## 2015-02-02 ENCOUNTER — Inpatient Hospital Stay
Admission: EM | Admit: 2015-02-02 | Discharge: 2015-02-12 | DRG: 643 | Disposition: A | Discharge status: Home Health | Payer: Medicare Other | Attending: Internal Medicine | Admitting: Internal Medicine

## 2015-02-02 ENCOUNTER — Encounter: Payer: Self-pay | Admitting: Internal Medicine

## 2015-02-02 ENCOUNTER — Ambulatory Visit (INDEPENDENT_AMBULATORY_CARE_PROVIDER_SITE_OTHER): Payer: Medicare Other | Admitting: Internal Medicine

## 2015-02-02 VITALS — BP 116/75 | HR 83 | Temp 98.3°F | Ht 64.0 in | Wt 148.0 lb

## 2015-02-02 DIAGNOSIS — T85598A Other mechanical complication of other gastrointestinal prosthetic devices, implants and grafts, initial encounter: Secondary | ICD-10-CM

## 2015-02-02 DIAGNOSIS — R1911 Absent bowel sounds: Secondary | ICD-10-CM | POA: Diagnosis not present

## 2015-02-02 DIAGNOSIS — E222 Syndrome of inappropriate secretion of antidiuretic hormone: Principal | ICD-10-CM | POA: Diagnosis present

## 2015-02-02 DIAGNOSIS — J440 Chronic obstructive pulmonary disease with acute lower respiratory infection: Secondary | ICD-10-CM | POA: Diagnosis not present

## 2015-02-02 DIAGNOSIS — F329 Major depressive disorder, single episode, unspecified: Secondary | ICD-10-CM | POA: Diagnosis present

## 2015-02-02 DIAGNOSIS — Z79899 Other long term (current) drug therapy: Secondary | ICD-10-CM | POA: Diagnosis not present

## 2015-02-02 DIAGNOSIS — I959 Hypotension, unspecified: Secondary | ICD-10-CM | POA: Diagnosis not present

## 2015-02-02 DIAGNOSIS — Z931 Gastrostomy status: Secondary | ICD-10-CM

## 2015-02-02 DIAGNOSIS — E039 Hypothyroidism, unspecified: Secondary | ICD-10-CM | POA: Diagnosis present

## 2015-02-02 DIAGNOSIS — R0902 Hypoxemia: Secondary | ICD-10-CM | POA: Diagnosis not present

## 2015-02-02 DIAGNOSIS — K224 Dyskinesia of esophagus: Secondary | ICD-10-CM | POA: Diagnosis present

## 2015-02-02 DIAGNOSIS — J411 Mucopurulent chronic bronchitis: Secondary | ICD-10-CM | POA: Diagnosis not present

## 2015-02-02 DIAGNOSIS — E2740 Unspecified adrenocortical insufficiency: Secondary | ICD-10-CM | POA: Diagnosis not present

## 2015-02-02 DIAGNOSIS — Z87891 Personal history of nicotine dependence: Secondary | ICD-10-CM

## 2015-02-02 DIAGNOSIS — R1310 Dysphagia, unspecified: Secondary | ICD-10-CM | POA: Diagnosis not present

## 2015-02-02 DIAGNOSIS — Z8249 Family history of ischemic heart disease and other diseases of the circulatory system: Secondary | ICD-10-CM

## 2015-02-02 DIAGNOSIS — D473 Essential (hemorrhagic) thrombocythemia: Secondary | ICD-10-CM | POA: Diagnosis present

## 2015-02-02 DIAGNOSIS — J9601 Acute respiratory failure with hypoxia: Secondary | ICD-10-CM | POA: Diagnosis not present

## 2015-02-02 DIAGNOSIS — M069 Rheumatoid arthritis, unspecified: Secondary | ICD-10-CM | POA: Diagnosis present

## 2015-02-02 DIAGNOSIS — I951 Orthostatic hypotension: Secondary | ICD-10-CM | POA: Diagnosis present

## 2015-02-02 DIAGNOSIS — I10 Essential (primary) hypertension: Secondary | ICD-10-CM | POA: Diagnosis present

## 2015-02-02 DIAGNOSIS — E785 Hyperlipidemia, unspecified: Secondary | ICD-10-CM | POA: Diagnosis present

## 2015-02-02 DIAGNOSIS — E876 Hypokalemia: Secondary | ICD-10-CM | POA: Diagnosis not present

## 2015-02-02 DIAGNOSIS — J441 Chronic obstructive pulmonary disease with (acute) exacerbation: Secondary | ICD-10-CM

## 2015-02-02 DIAGNOSIS — I69991 Dysphagia following unspecified cerebrovascular disease: Secondary | ICD-10-CM | POA: Diagnosis not present

## 2015-02-02 DIAGNOSIS — K219 Gastro-esophageal reflux disease without esophagitis: Secondary | ICD-10-CM | POA: Diagnosis present

## 2015-02-02 DIAGNOSIS — D539 Nutritional anemia, unspecified: Secondary | ICD-10-CM | POA: Diagnosis present

## 2015-02-02 DIAGNOSIS — J690 Pneumonitis due to inhalation of food and vomit: Secondary | ICD-10-CM

## 2015-02-02 DIAGNOSIS — Z7982 Long term (current) use of aspirin: Secondary | ICD-10-CM

## 2015-02-02 DIAGNOSIS — E871 Hypo-osmolality and hyponatremia: Secondary | ICD-10-CM

## 2015-02-02 DIAGNOSIS — D72819 Decreased white blood cell count, unspecified: Secondary | ICD-10-CM

## 2015-02-02 DIAGNOSIS — R634 Abnormal weight loss: Secondary | ICD-10-CM | POA: Diagnosis not present

## 2015-02-02 DIAGNOSIS — M4802 Spinal stenosis, cervical region: Secondary | ICD-10-CM | POA: Diagnosis not present

## 2015-02-02 DIAGNOSIS — I6521 Occlusion and stenosis of right carotid artery: Secondary | ICD-10-CM | POA: Diagnosis present

## 2015-02-02 DIAGNOSIS — D649 Anemia, unspecified: Secondary | ICD-10-CM | POA: Diagnosis not present

## 2015-02-02 DIAGNOSIS — Z4682 Encounter for fitting and adjustment of non-vascular catheter: Secondary | ICD-10-CM | POA: Diagnosis not present

## 2015-02-02 DIAGNOSIS — R4189 Other symptoms and signs involving cognitive functions and awareness: Secondary | ICD-10-CM | POA: Diagnosis not present

## 2015-02-02 DIAGNOSIS — M47812 Spondylosis without myelopathy or radiculopathy, cervical region: Secondary | ICD-10-CM | POA: Diagnosis present

## 2015-02-02 DIAGNOSIS — D696 Thrombocytopenia, unspecified: Secondary | ICD-10-CM | POA: Diagnosis present

## 2015-02-02 DIAGNOSIS — R933 Abnormal findings on diagnostic imaging of other parts of digestive tract: Secondary | ICD-10-CM | POA: Diagnosis not present

## 2015-02-02 DIAGNOSIS — K117 Disturbances of salivary secretion: Secondary | ICD-10-CM | POA: Diagnosis not present

## 2015-02-02 DIAGNOSIS — I2510 Atherosclerotic heart disease of native coronary artery without angina pectoris: Secondary | ICD-10-CM | POA: Diagnosis present

## 2015-02-02 DIAGNOSIS — G9341 Metabolic encephalopathy: Secondary | ICD-10-CM | POA: Diagnosis not present

## 2015-02-02 DIAGNOSIS — Z8673 Personal history of transient ischemic attack (TIA), and cerebral infarction without residual deficits: Secondary | ICD-10-CM

## 2015-02-02 DIAGNOSIS — T17308A Unspecified foreign body in larynx causing other injury, initial encounter: Secondary | ICD-10-CM

## 2015-02-02 DIAGNOSIS — N871 Moderate cervical dysplasia: Secondary | ICD-10-CM | POA: Diagnosis not present

## 2015-02-02 DIAGNOSIS — Z66 Do not resuscitate: Secondary | ICD-10-CM | POA: Diagnosis not present

## 2015-02-02 DIAGNOSIS — M6281 Muscle weakness (generalized): Secondary | ICD-10-CM | POA: Diagnosis not present

## 2015-02-02 DIAGNOSIS — T17908A Unspecified foreign body in respiratory tract, part unspecified causing other injury, initial encounter: Secondary | ICD-10-CM

## 2015-02-02 DIAGNOSIS — Z9181 History of falling: Secondary | ICD-10-CM | POA: Diagnosis not present

## 2015-02-02 DIAGNOSIS — R531 Weakness: Secondary | ICD-10-CM

## 2015-02-02 DIAGNOSIS — R0989 Other specified symptoms and signs involving the circulatory and respiratory systems: Secondary | ICD-10-CM

## 2015-02-02 DIAGNOSIS — Z4659 Encounter for fitting and adjustment of other gastrointestinal appliance and device: Secondary | ICD-10-CM

## 2015-02-02 DIAGNOSIS — R296 Repeated falls: Secondary | ICD-10-CM | POA: Diagnosis not present

## 2015-02-02 DIAGNOSIS — J449 Chronic obstructive pulmonary disease, unspecified: Secondary | ICD-10-CM | POA: Diagnosis not present

## 2015-02-02 HISTORY — DX: Systemic involvement of connective tissue, unspecified: M35.9

## 2015-02-02 HISTORY — DX: Unspecified adrenocortical insufficiency: E27.40

## 2015-02-02 HISTORY — DX: Repeated falls: R29.6

## 2015-02-02 HISTORY — DX: Unspecified fall, initial encounter: W19.XXXA

## 2015-02-02 HISTORY — DX: Spinal stenosis, cervical region: M48.02

## 2015-02-02 LAB — OSMOLALITY: Osmolality: 261 mOsm/kg — ABNORMAL LOW (ref 275–295)

## 2015-02-02 LAB — COMPREHENSIVE METABOLIC PANEL
ALK PHOS: 57 U/L (ref 39–117)
ALT: 9 U/L (ref 0–53)
AST: 16 U/L (ref 0–37)
Albumin: 3.9 g/dL (ref 3.5–5.2)
BILIRUBIN TOTAL: 0.5 mg/dL (ref 0.2–1.2)
BUN: 17 mg/dL (ref 6–23)
CO2: 34 meq/L — AB (ref 19–32)
CREATININE: 0.66 mg/dL (ref 0.40–1.50)
Calcium: 8.9 mg/dL (ref 8.4–10.5)
Chloride: 84 mEq/L — ABNORMAL LOW (ref 96–112)
GFR: 125.56 mL/min (ref >60.00)
Glucose, Bld: 94 mg/dL (ref 70–99)
Potassium: 4.7 mEq/L (ref 3.5–5.1)
Sodium: 123 mEq/L — ABNORMAL LOW (ref 135–145)
Total Protein: 6.7 g/dL (ref 6.0–8.3)

## 2015-02-02 LAB — URINALYSIS COMPLETE WITH MICROSCOPIC (ARMC ONLY)
BACTERIA UA: NONE SEEN
Bilirubin Urine: NEGATIVE
GLUCOSE, UA: NEGATIVE mg/dL
Hgb urine dipstick: NEGATIVE
KETONES UR: NEGATIVE mg/dL
Leukocytes, UA: NEGATIVE
NITRITE: NEGATIVE
PROTEIN: NEGATIVE mg/dL
SPECIFIC GRAVITY, URINE: 1.019 (ref 1.005–1.030)
Squamous Epithelial / LPF: NONE SEEN
pH: 6 (ref 5.0–8.0)

## 2015-02-02 LAB — BASIC METABOLIC PANEL
ANION GAP: 6 (ref 5–15)
BUN: 18 mg/dL (ref 6–20)
CHLORIDE: 87 mmol/L — AB (ref 101–111)
CO2: 33 mmol/L — AB (ref 22–32)
CREATININE: 0.58 mg/dL — AB (ref 0.61–1.24)
Calcium: 8.8 mg/dL — ABNORMAL LOW (ref 8.9–10.3)
GFR calc Af Amer: >60 mL/min (ref >60)
GFR calc non Af Amer: >60 mL/min (ref >60)
Glucose, Bld: 118 mg/dL — ABNORMAL HIGH (ref 65–99)
POTASSIUM: 4 mmol/L (ref 3.5–5.1)
Sodium: 126 mmol/L — ABNORMAL LOW (ref 135–145)

## 2015-02-02 LAB — CBC WITH DIFFERENTIAL/PLATELET
BASOS ABS: 0 10*3/uL (ref 0–0.1)
Basophils Relative: 1 %
EOS ABS: 0 10*3/uL (ref 0–0.7)
Eosinophils Relative: 1 %
HEMATOCRIT: 28.2 % — AB (ref 40.0–52.0)
HEMOGLOBIN: 9.6 g/dL — AB (ref 13.0–18.0)
Lymphocytes Relative: 32 %
Lymphs Abs: 0.9 10*3/uL — ABNORMAL LOW (ref 1.0–3.6)
MCH: 44.2 pg — ABNORMAL HIGH (ref 26.0–34.0)
MCHC: 34.3 g/dL (ref 32.0–36.0)
MCV: 128.9 fL — ABNORMAL HIGH (ref 80.0–100.0)
MONOS PCT: 15 %
Monocytes Absolute: 0.4 10*3/uL (ref 0.2–1.0)
NEUTROS ABS: 1.5 10*3/uL (ref 1.4–6.5)
NEUTROS PCT: 51 %
Platelets: 424 10*3/uL (ref 150–440)
RBC: 2.18 MIL/uL — AB (ref 4.40–5.90)
RDW: 14.7 % — ABNORMAL HIGH (ref 11.5–14.5)
WBC: 2.8 10*3/uL — AB (ref 3.8–10.6)

## 2015-02-02 LAB — OSMOLALITY, URINE: Osmolality, Ur: 623 mOsm/kg (ref 300–900)

## 2015-02-02 MED ORDER — ACETAMINOPHEN 325 MG PO TABS: 650.0000 mg | ORAL_TABLET | Freq: Four times a day (QID) | ORAL | Status: DC | PRN

## 2015-02-02 MED ORDER — MOMETASONE FUROATE 0.1 % EX SOLN: 1.0000 "application " | CUTANEOUS | Status: DC | PRN

## 2015-02-02 MED ORDER — ACETAMINOPHEN 650 MG RE SUPP: 650.0000 mg | Freq: Four times a day (QID) | RECTAL | Status: DC | PRN

## 2015-02-02 MED ORDER — OMEGA-3-ACID ETHYL ESTERS 1 G PO CAPS
1.0000 g | ORAL_CAPSULE | Freq: Every day | ORAL | Status: DC
  Administered 2015-02-03 – 2015-02-07 (×3): 1 g via ORAL
  Filled 2015-02-02 (×4): qty 1

## 2015-02-02 MED ORDER — LEVOTHYROXINE SODIUM 137 MCG PO TABS: 137.0000 ug | ORAL_TABLET | Freq: Every day | ORAL | Status: DC

## 2015-02-02 MED ORDER — HYDROXYUREA 500 MG PO CAPS
500.0000 mg | ORAL_CAPSULE | Freq: Three times a day (TID) | ORAL | Status: DC
  Administered 2015-02-03 – 2015-02-04 (×4): 500 mg via ORAL
  Filled 2015-02-02 (×5): qty 1

## 2015-02-02 MED ORDER — ESCITALOPRAM OXALATE 10 MG PO TABS
20.0000 mg | ORAL_TABLET | Freq: Every day | ORAL | Status: DC
  Administered 2015-02-03 – 2015-02-07 (×4): 20 mg via ORAL
  Filled 2015-02-02 (×5): qty 2

## 2015-02-02 MED ORDER — ENOXAPARIN SODIUM 40 MG/0.4ML ~~LOC~~ SOLN
40.0000 mg | Freq: Every day | SUBCUTANEOUS | Status: AC
  Administered 2015-02-02 – 2015-02-07 (×6): 40 mg via SUBCUTANEOUS
  Filled 2015-02-02 (×6): qty 0.4

## 2015-02-02 MED ORDER — DOCUSATE SODIUM 100 MG PO CAPS
100.0000 mg | ORAL_CAPSULE | Freq: Two times a day (BID) | ORAL | Status: DC
  Administered 2015-02-03 – 2015-02-07 (×3): 100 mg via ORAL
  Filled 2015-02-02 (×5): qty 1

## 2015-02-02 MED ORDER — PANTOPRAZOLE SODIUM 40 MG PO TBEC
40.0000 mg | DELAYED_RELEASE_TABLET | Freq: Every day | ORAL | Status: DC
  Administered 2015-02-03 – 2015-02-04 (×2): 40 mg via ORAL
  Filled 2015-02-02 (×3): qty 1

## 2015-02-02 MED ORDER — LEVOTHYROXINE SODIUM 137 MCG PO TABS
137.0000 ug | ORAL_TABLET | Freq: Every day | ORAL | Status: DC
  Administered 2015-02-03 – 2015-02-07 (×4): 137 ug via ORAL
  Filled 2015-02-02 (×5): qty 1

## 2015-02-02 MED ORDER — NYSTATIN 100000 UNIT/ML MT SUSP: 5.0000 mL | OROMUCOSAL | Status: DC | PRN

## 2015-02-02 MED ORDER — FLUDROCORTISONE ACETATE 0.1 MG PO TABS
0.1000 mg | ORAL_TABLET | Freq: Every day | ORAL | Status: DC
  Administered 2015-02-03 – 2015-02-07 (×4): 0.1 mg via ORAL
  Filled 2015-02-02 (×6): qty 1

## 2015-02-02 MED ORDER — CLONAZEPAM 0.5 MG PO TABS: 0.5000 mg | ORAL_TABLET | Freq: Three times a day (TID) | ORAL | Status: DC | PRN

## 2015-02-02 MED ORDER — ASPIRIN 81 MG PO CHEW
162.0000 mg | CHEWABLE_TABLET | Freq: Every day | ORAL | Status: AC
  Administered 2015-02-03 – 2015-02-07 (×4): 162 mg via ORAL
  Filled 2015-02-02 (×5): qty 2

## 2015-02-02 MED ORDER — OMEPRAZOLE 20 MG PO CPDR: 20.0000 mg | DELAYED_RELEASE_CAPSULE | Freq: Every day | ORAL | Status: DC

## 2015-02-02 MED ORDER — MOMETASONE FURO-FORMOTEROL FUM 100-5 MCG/ACT IN AERO
2.0000 | INHALATION_SPRAY | Freq: Two times a day (BID) | RESPIRATORY_TRACT | Status: DC
  Administered 2015-02-03 – 2015-02-12 (×19): 2 via RESPIRATORY_TRACT
  Filled 2015-02-02: qty 8.8

## 2015-02-02 MED ORDER — MECLIZINE HCL 25 MG PO TABS: 25.0000 mg | ORAL_TABLET | Freq: Two times a day (BID) | ORAL | Status: DC | PRN

## 2015-02-02 MED ORDER — ONDANSETRON HCL 4 MG/2ML IJ SOLN
4.0000 mg | Freq: Four times a day (QID) | INTRAMUSCULAR | Status: DC | PRN
  Administered 2015-02-03: 06:00:00 4 mg via INTRAVENOUS
  Filled 2015-02-02: qty 2

## 2015-02-02 MED ORDER — SODIUM CHLORIDE 1 G PO TABS
1.0000 g | ORAL_TABLET | Freq: Two times a day (BID) | ORAL | Status: DC
  Administered 2015-02-03 (×2): 1 g via ORAL
  Filled 2015-02-02 (×3): qty 1

## 2015-02-02 MED ORDER — POLYETHYLENE GLYCOL 3350 17 G PO PACK: 17.0000 g | PACK | Freq: Every day | ORAL | Status: DC | PRN

## 2015-02-02 MED ORDER — TRAZODONE HCL 50 MG PO TABS: 25.0000 mg | ORAL_TABLET | Freq: Every evening | ORAL | Status: DC | PRN

## 2015-02-02 MED ORDER — FLUTICASONE PROPIONATE 50 MCG/ACT NA SUSP
1.0000 | Freq: Every day | NASAL | Status: DC | PRN
  Administered 2015-02-06 – 2015-02-12 (×2): 1 via NASAL
  Filled 2015-02-02: qty 16

## 2015-02-02 MED ORDER — ONDANSETRON HCL 4 MG PO TABS: 4.0000 mg | ORAL_TABLET | Freq: Four times a day (QID) | ORAL | Status: DC | PRN

## 2015-02-02 MED ORDER — SODIUM CHLORIDE 0.9 % IJ SOLN
3.0000 mL | Freq: Two times a day (BID) | INTRAMUSCULAR | Status: DC
  Administered 2015-02-02 – 2015-02-12 (×16): 3 mL via INTRAVENOUS

## 2015-02-02 MED ORDER — VITAMIN B-12 1000 MCG PO TABS
1000.0000 ug | ORAL_TABLET | Freq: Every day | ORAL | Status: DC
  Administered 2015-02-03 – 2015-02-07 (×3): 1000 ug via ORAL
  Filled 2015-02-02 (×5): qty 1

## 2015-02-02 NOTE — Assessment & Plan Note (Signed)
Chronic hyponatremia. Will repeat Na today.

## 2015-02-02 NOTE — H&P (Signed)
Johnny Sanford NAME: Johnny Sanford    MR#:  [id number]  DATE OF BIRTH:  30-Jan-1940  DATE OF ADMISSION:  02/02/2015  PRIMARY CARE PHYSICIAN: Rica Mast, MD   REQUESTING/REFERRING PHYSICIAN: Dr. Delman Kitten  CHIEF COMPLAINT:   Chief Complaint  Patient presents with  . Low Sodium    Low Sodium    HISTORY OF PRESENT ILLNESS:  Johnny Sanford  is a 75 y.o. male with a known history of hypertension, hyperlipidemia, GERD, bilateral carotid stenosis status post left carotid endarterectomy, severe cervical spine stenosis presents to the hospital secondary to weakness, confusion and falls lately at home. Seen by PCP 3 days ago and had blood work done. Sodium is outpatient was 123 and sore effort to the hospital. Patient has history of SIADH and is supposed to be on fluid restriction, however has been drinking a lot of protein shakes and water. He has chronic xerostomia and dry mouth so he feels thirsty and drinks plenty of fluids. Is alert and oriented at this time. Denies any dizziness or aura prior to the falls. He does have a vaginal insufficiency and not sure about any orthostatic hypotension. He does not lose consciousness during those falls. His sodium today is 126.  PAST MEDICAL HISTORY:   Past Medical History  Diagnosis Date  . HLD (hyperlipidemia)   . HTN (hypertension)   . GERD (gastroesophageal reflux disease)   . Hypothyroidism   . Cancer ([place]) 1991    sqauamous cell ca  . CVA (cerebral vascular accident) ([place])   . DA (degenerative arthritis)   . RA (rheumatoid arthritis) ([place])   . Adrenal insufficiency ([place])   . Cervical spinal stenosis   . Falls     PAST SURGICAL HISTORY:   Past Surgical History  Procedure Laterality Date  . Carotid endarterectomy      left  . Skin lesion excision      Dr. Sarajane Jews, and Dr. Vickki Muff at [place]:   Social History  Substance Use Topics  . Smoking status:  Former Smoker -- 15 years    Types: Cigarettes    Quit date: 10/07/2001  . Smokeless tobacco: Never Used     Comment: Quit 13 years ago  . Alcohol Use: No    FAMILY HISTORY:   Family History  Problem Relation Age of Onset  . Heart disease Father     DRUG ALLERGIES:   Allergies  Allergen Reactions  . Simvastatin Anxiety    REVIEW OF SYSTEMS:   Review of Systems  Constitutional: Positive for malaise/fatigue. Negative for fever, chills and weight loss.  HENT: Negative for ear discharge, ear pain, nosebleeds and tinnitus.   Eyes: Negative for blurred vision, double vision and photophobia.  Respiratory: Positive for cough. Negative for hemoptysis, shortness of breath and wheezing.   Cardiovascular: Negative for chest pain, palpitations, orthopnea and leg swelling.  Gastrointestinal: Negative for heartburn, nausea, vomiting, abdominal pain, diarrhea, constipation and melena.  Genitourinary: Negative for dysuria, urgency, frequency and hematuria.  Musculoskeletal: Positive for joint pain, falls and neck pain. Negative for myalgias and back pain.  Skin: Negative for rash.  Neurological: Negative for dizziness, tremors, sensory change, speech change, focal weakness and headaches.       Confusion  Endo/Heme/Allergies: Does not bruise/bleed easily.  Psychiatric/Behavioral: Negative for depression.    MEDICATIONS AT HOME:   Prior to Admission medications   Medication Sig Start Date End Date Taking? Authorizing  Provider  aspirin 81 MG EC tablet Take 162 mg by mouth daily.    Yes Historical Provider, MD  clonazePAM (KLONOPIN) 0.5 MG tablet Take 1 tablet (0.5 mg total) by mouth 3 (three) times daily as needed for anxiety. 01/08/15  Yes Jackolyn Confer, MD  escitalopram (LEXAPRO) 20 MG tablet Take 20 mg by mouth daily.   Yes Historical Provider, MD  vitamin B-12 (CYANOCOBALAMIN) 1000 MCG tablet Take 1,000 mcg by mouth daily.   Yes Historical Provider, MD  fludrocortisone (FLORINEF)  0.1 MG tablet Take 1 tablet (0.1 mg total) by mouth daily. 09/14/14   Jackolyn Confer, MD  fluticasone (FLONASE) 50 MCG/ACT nasal spray Place 1 spray into both nostrils daily as needed for rhinitis. 06/24/13   Jackolyn Confer, MD  Fluticasone Furoate-Vilanterol (BREO ELLIPTA) 200-25 MCG/INH AEPB Inhale 200 mcg into the lungs daily. 04/11/14   Vishal Mungal, MD  hydroxyurea (HYDREA) 500 MG capsule Take 500 mg by mouth 3 (three) times daily.     Historical Provider, MD  levothyroxine (SYNTHROID, LEVOTHROID) 137 MCG tablet Take 1 tablet (137 mcg total) by mouth daily. 02/02/15   Jackolyn Confer, MD  meclizine (ANTIVERT) 25 MG tablet Take 25 mg by mouth 2 (two) times daily as needed.  03/11/13   Historical Provider, MD  mometasone (ELOCON) 0.1 % lotion as needed.  03/05/12   Historical Provider, MD  nystatin (MYCOSTATIN) 100000 UNIT/ML suspension  09/27/14   Historical Provider, MD  OMEGA-3 1000 MG CAPS Take 1 capsule by mouth daily.      Historical Provider, MD  omeprazole (PRILOSEC) 20 MG capsule Take 1 capsule (20 mg total) by mouth daily. 02/02/15   Jackolyn Confer, MD  traZODone (DESYREL) 50 MG tablet Take 0.5-1 tablets (25-50 mg total) by mouth at bedtime as needed for sleep. 10/11/14 03/12/16  Jackolyn Confer, MD      VITAL SIGNS:  Blood pressure 156/111, pulse 70, temperature 98.4 F (36.9 C), temperature source Oral, resp. rate 19, height 5\' 4"  (1.626 m), weight 67.132 kg (148 lb), SpO2 98 %.  PHYSICAL EXAMINATION:   Physical Exam  GENERAL:  75 y.o.-year-old patient lying in the bed with no acute distress.  EYES: Pupils equal, round, reactive to light and accommodation. No scleral icterus. Extraocular muscles intact.  HEENT: Head atraumatic, normocephalic. Oropharynx and nasopharynx clear. Some thick secretions in the mouth. NECK:  Supple, no jugular venous distention. No thyroid enlargement, no tenderness.  LUNGS: Normal breath sounds bilaterally, no wheezing, rales or crepitation.  Scattered rhonchi at bases. No use of accessory muscles of respiration.  CARDIOVASCULAR: S1, S2 normal. No murmurs, rubs, or gallops.  ABDOMEN: Soft, nontender, nondistended. Bowel sounds present. No organomegaly or mass.  EXTREMITIES: No pedal edema, cyanosis, or clubbing.  NEUROLOGIC: Cranial nerves II through XII are intact. Muscle strength 5/5 in all extremities. Sensation intact. Gait not checked.  PSYCHIATRIC: The patient is alert and oriented x 3.  SKIN: No obvious rash, lesion, or ulcer.   LABORATORY PANEL:   CBC  Recent Labs Lab 02/02/15 1646  WBC 2.8*  HGB 9.6*  HCT 28.2*  PLT 424   ------------------------------------------------------------------------------------------------------------------  Chemistries   Recent Labs Lab 02/02/15 1154 02/02/15 1646  NA 123* 126*  K 4.7 4.0  CL 84* 87*  CO2 34* 33*  GLUCOSE 94 118*  BUN 17 18  CREATININE 0.66 0.58*  CALCIUM 8.9 8.8*  AST 16  --   ALT 9  --   ALKPHOS 57  --  BILITOT 0.5  --    ------------------------------------------------------------------------------------------------------------------  Cardiac Enzymes No results for input(s): TROPONINI in the last 168 hours. ------------------------------------------------------------------------------------------------------------------  RADIOLOGY:  No results found.  EKG:   Orders placed or performed in visit on 03/17/14  . EKG 12-Lead    IMPRESSION AND PLAN:   Raheen Cude  is a 75 y.o. male with a known history of hypertension, hyperlipidemia, GERD, bilateral carotid stenosis status post left carotid endarterectomy, severe cervical spine stenosis presents to the hospital secondary to weakness, confusion and falls lately at home.  #1 hyponatremia-h/o SIADH and also not following fluid restriction - follows with nephrology as outpatient for the same. - Nephrology consulted - fluid restriction ordered and also salt tabs bid - serum and urine  osm  #2 falls- could be from hyponatremia, also his spinal stenosis  - physical therapy consult - due to h/o adrenal insufficiency- check orthostatics - on florinef  #3 hypertension- cont home meds  #4 cervical spine stenosis- follows with Dr. Jennings Books and also recently seen a neurosurgeon in [place]. Awaiting a second opinion from neuro surgeon at [facility]  #5 anemia and leukopenia- hb stable, wbc decreased- monitor for now - also on hydrea at home-?   #6 DVT prophylaxis- lovenox  Physical therapy consulted.    All the records are reviewed and case discussed with ED provider. Management plans discussed with the patient, family and they are in agreement.  CODE STATUS: Full Code  TOTAL TIME TAKING CARE OF THIS PATIENT: 50 minutes.    Gladstone Lighter M.D on 02/02/2015 at 7:39 PM  Between 7am to 6pm - Pager - [phone]  After 6pm go to [email or web address] - password [email or web address] [facility]  Office  [phone]  CC: Primary care physician; Rica Mast, MD

## 2015-02-02 NOTE — Assessment & Plan Note (Signed)
Reviewed MRI of cervical spine. Pt has been seen by neurosurgery, and second opinion in process to discuss possible surgical intervention. Continue Tylenol as needed for pain. Follow up after second opinion.

## 2015-02-02 NOTE — Assessment & Plan Note (Signed)
Reviewed notes from pulmonary. COPD has been stable. Continue to avoid second hand smoke exposure. Continue Breo.

## 2015-02-02 NOTE — Assessment & Plan Note (Signed)
Dysphagia over last 6 months. Progressively worsening. Will set up barium swallow for further evaluation.

## 2015-02-02 NOTE — Progress Notes (Signed)
Pt admitted to unit.  Telemetry box 40-37 applied NSR HR 78.  Pt refused medications except for Lovenox.  Pt also refused SCD or TED hose.  Explained high risk for falls due to orthostatic changes when standing.  Pt noncompliant with using call light to get up to use urinal.  Bed alarm in use and frequent rounds with offer to assist with elimination provided. Dorna Bloom RN

## 2015-02-02 NOTE — Assessment & Plan Note (Signed)
BP Readings from Last 3 Encounters:  02/02/15 116/75  01/15/15 132/78  01/08/15 90/58   BP well controlled today. Will continue to monitor.

## 2015-02-02 NOTE — ED Provider Notes (Signed)
[facility] Emergency Department Provider Note REMINDER - THIS NOTE IS NOT A FINAL MEDICAL RECORD UNTIL IT IS SIGNED. UNTIL THEN, THE CONTENT BELOW MAY REFLECT INFORMATION FROM A DOCUMENTATION TEMPLATE, NOT THE ACTUAL PATIENT VISIT. ____________________________________________  Time seen: Approximately 6:57 PM  I have reviewed the triage vital signs and the nursing notes.   HISTORY  Chief Complaint Low Sodium    HPI Johnny Sanford is a 75 y.o. male previous history of adrenal insufficiency, hyponatremia, follow-up with oncology for low blood counts.  Patient resents reports for last 4 days been feeling slightly confused, generally weak, and reports she had the same symptoms in the past with low sodium. He does report he stumbled a couple of times.  No fevers chills chest pain or trouble breathing. Denies any weakness in one arm or leg. No facial droop. Reports had the same symptoms before with low sodium and his sodium was checked by his doctor and found to be low.  He takes steroid, he did take his dose today does not do dental tomorrow. Past Medical History  Diagnosis Date  . HLD (hyperlipidemia)   . HTN (hypertension)   . GERD (gastroesophageal reflux disease)   . Hypothyroidism   . Cancer ([place]) 1991    sqauamous cell ca  . CVA (cerebral vascular accident) ([place])   . DA (degenerative arthritis)   . RA (rheumatoid arthritis) ([place])   . Adrenal insufficiency [facility])     Patient Active Problem List   Diagnosis Date Noted  . Dysphagia 02/02/2015  . Spinal stenosis in cervical region 01/08/2015  . Recurrent falls 06/29/2014  . Allergic rhinitis 05/04/2014  . 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 CAD (coronary artery disease) 04/13/2014  . Multiple lung nodules on CT 04/13/2014  . Atherosclerosis of aorta ([place]) 04/13/2014  . Cough 03/31/2014  . Medicare annual wellness visit, subsequent 12/09/2013  . Anxiety state 05/10/2013  .  Chronic neck pain 11/24/2012  . Abnormal CT scan, chest 06/03/2012  . Labile hypertension 05/20/2012  . Memory loss 03/31/2012  . Adrenal insufficiency ([place]) 01/20/2012  . Hyponatremia 12/24/2011  . Fatigue 10/28/2011  . Essential thrombocythemia ([place]) 06/13/2011  . Low back pain 05/19/2011  . Cardiomegaly 04/02/2011  . COPD (chronic obstructive pulmonary disease) ([place]) 04/02/2011  . Rheumatoid arthritis(714.0) 03/11/2011  . Insomnia 02/17/2011  . Arthralgia 02/17/2011  . PAD (peripheral artery disease) ([place]) 08/30/2009  . Carotid artery stenosis 06/10/2009  . Hyperlipidemia 06/08/2009    Past Surgical History  Procedure Laterality Date  . Carotid endarterectomy    . Skin lesion excision      Dr. Sarajane Jews, and Dr. Vickki Muff at [facility]  Name  Route  Sig  Dispense  Refill  . aspirin 81 MG EC tablet   Oral   Take 162 mg by mouth daily.          . clonazePAM (KLONOPIN) 0.5 MG tablet   Oral   Take 1 tablet (0.5 mg total) by mouth 3 (three) times daily as needed for anxiety.   90 tablet   3   . Cyanocobalamin (VITAMIN B-12 PO)   Oral   Take by mouth daily.         Marland Kitchen escitalopram (LEXAPRO) 20 MG tablet      Take 1 tablet (20 mg total) by mouth daily.   30 tablet   2   . fludrocortisone (FLORINEF) 0.1 MG tablet   Oral  Take 1 tablet (0.1 mg total) by mouth daily.   30 tablet   5   . fluorouracil (EFUDEX) 5 % cream   Topical   Apply topically as needed.   40 g   1   . fluticasone (FLONASE) 50 MCG/ACT nasal spray   Each Nare   Place 1 spray into both nostrils daily as needed for rhinitis.   16 g   6   . Fluticasone Furoate-Vilanterol (BREO ELLIPTA) 200-25 MCG/INH AEPB   Inhalation   Inhale 200 mcg into the lungs daily.   1 each   6   . hydroxyurea (HYDREA) 500 MG capsule      Take 3 capsules daily. May take with food to minimize GI side effects.         Marland Kitchen levothyroxine (SYNTHROID, LEVOTHROID) 137 MCG tablet   Oral   Take 1  tablet (137 mcg total) by mouth daily.   90 tablet   3   . meclizine (ANTIVERT) 25 MG tablet   Oral   Take 25 mg by mouth 2 (two) times daily as needed.          . mometasone (ELOCON) 0.1 % lotion      as needed.          . nystatin (MYCOSTATIN) 100000 UNIT/ML suspension               . OMEGA-3 1000 MG CAPS   Oral   Take 1 capsule by mouth daily.           Marland Kitchen omeprazole (PRILOSEC) 20 MG capsule   Oral   Take 1 capsule (20 mg total) by mouth daily.   90 capsule   3   . traZODone (DESYREL) 50 MG tablet   Oral   Take 0.5-1 tablets (25-50 mg total) by mouth at bedtime as needed for sleep.   30 tablet   2     Allergies Simvastatin  Family History  Problem Relation Age of Onset  . Heart disease Father     Social History Social History  Substance Use Topics  . Smoking status: Former Smoker -- 15 years    Types: Cigarettes    Quit date: 10/07/2001  . Smokeless tobacco: Never Used     Comment: tobacco use - no  . Alcohol Use: No    Review of Systems Constitutional: No fever/chills Eyes: No visual changes. ENT: No sore throat. Cardiovascular: Denies chest pain. Respiratory: Denies shortness of breath. Gastrointestinal: No abdominal pain.  No nausea, no vomiting.  No diarrhea.  No constipation. Genitourinary: Negative for dysuria. Musculoskeletal: Negative for back pain. Skin: Negative for rash. Neurological: Negative for headaches, focal weakness or numbness. Reports generalized fatigue and some confusion.  10-point ROS otherwise negative.  ____________________________________________   PHYSICAL EXAM:  VITAL SIGNS: ED Triage Vitals  Enc Vitals Group     BP 02/02/15 1639 125/76 mmHg     Pulse Rate 02/02/15 1639 81     Resp 02/02/15 1639 18     Temp 02/02/15 1639 98.4 F (36.9 C)     Temp Source 02/02/15 1639 Oral     SpO2 02/02/15 1639 97 %     Weight 02/02/15 1639 148 lb (67.132 kg)     Height 02/02/15 1639 5\' 4"  (1.626 m)     Head Cir  --      Peak Flow --      Pain Score 02/02/15 1641 0     Pain Loc --  Pain Edu? --      Excl. in [place]? --    Constitutional: Alert and oriented. Chronically on slightly fatigued appearing. Eyes: Conjunctivae are normal. PERRL. EOMI. Head: Atraumatic. Nose: No congestion/rhinnorhea. Mouth/Throat: Mucous membranes are very dry, patient reports is normal for him due to previous radiation.  Oropharynx non-erythematous. Neck: No stridor.   Cardiovascular: Normal rate, regular rhythm. Grossly normal heart sounds.  Good peripheral circulation. Respiratory: Normal respiratory effort.  No retractions. Lungs CTAB. Gastrointestinal: Soft and nontender. No distention. No abdominal bruits. No CVA tenderness. Musculoskeletal: No lower extremity tenderness nor edema.  No joint effusions. Neurologic:  Normal speech and language. No gross focal neurologic deficits are appreciated. Normal cranial nerve exam. Normal extraocular movements. No pronator drift. 4-5 strength in all extremities. Skin:  Skin is warm, dry and intact. No rash noted. Psychiatric: Mood and affect are normal. Speech and behavior are normal.  ____________________________________________   LABS (all labs ordered are listed, but only abnormal results are displayed)  Labs Reviewed  CBC WITH DIFFERENTIAL/PLATELET - Abnormal; Notable for the following:    WBC 2.8 (*)    RBC 2.18 (*)    Hemoglobin 9.6 (*)    HCT 28.2 (*)    MCV 128.9 (*)    MCH 44.2 (*)    RDW 14.7 (*)    Lymphs Abs 0.9 (*)    All other components within normal limits  BASIC METABOLIC PANEL - Abnormal; Notable for the following:    Sodium 126 (*)    Chloride 87 (*)    CO2 33 (*)    Glucose, Bld 118 (*)    Creatinine, Ser 0.58 (*)    Calcium 8.8 (*)    All other components within normal limits  URINALYSIS COMPLETEWITH MICROSCOPIC ([facility] ONLY)    ____________________________________________  EKG   ____________________________________________  RADIOLOGY  Discussed his symptoms with the patient, does appear that he's had these symptoms before. We did discuss obtaining a CT to evaluate for evidence of a "stroke" but after discussing the benefits versus the risks of radiation patient notes that his symptoms are definitely the same as with previous low sodium and he does not wish to have acute CT scan. I think this is reasonable. He will be admitted and continued to be observed closely. ____________________________________________   PROCEDURES  Procedure(s) performed: None  Critical Care performed: No  ____________________________________________   INITIAL IMPRESSION / ASSESSMENT AND PLAN / ED COURSE  Pertinent labs & imaging results that were available during my care of the patient were reviewed by me and considered in my medical decision making (see chart for details).  Patient has) hyponatremia. He has adrenal insufficiency. Discussed with nephrology who will see the patient in consult in the ER. They advise admission, further workup in the hospitalist service. No evidence of acute neurologic deficit. No acute cardio pulmonary symptoms. We'll fluid restrict here in the ER pearly recommendations from nephrology. ____________________________________________   FINAL CLINICAL IMPRESSION(S) / ED DIAGNOSES  Final diagnoses:  Hyponatremia  Adrenal insufficiency ([place])  Weakness generalized      Delman Kitten, MD 02/02/15 [phone]

## 2015-02-02 NOTE — Patient Instructions (Signed)
Labs today.  We will set up a barium swallow study.  Follow up in 4 weeks.

## 2015-02-02 NOTE — Progress Notes (Signed)
Subjective:    Patient ID: Johnny Sanford, male    DOB: 25-Jul-1940, 76 y.o.   MRN: [id number]  HPI  75YO male presents for follow up.  Recently seen by Dr. Edrick Kins in Neurosurgery. MRI brain and cervical spine showed severe spinal stenosis at C2-3. Planning to get a second opinion with Dr. Rigoberto Noel at [facility]. Scheduled Jan 30th.  Dyphagia - Noted off and on for several months. Food gets caught in neck. Only occurs with solid foods. No previous history of endoscopy. No NV.  Wife notes worsening balance, gait, and generalized weakness. He has fallen at home twice in the last month.  Wt Readings from Last 3 Encounters:  02/02/15 148 lb (67.132 kg)  01/15/15 153 lb 9.6 oz (69.673 kg)  01/08/15 149 lb 8 oz (67.813 kg)   BP Readings from Last 3 Encounters:  02/02/15 116/75  01/15/15 132/78  01/08/15 90/58    Past Medical History  Diagnosis Date  . HLD (hyperlipidemia)   . HTN (hypertension)   . GERD (gastroesophageal reflux disease)   . Hypothyroidism   . 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 CVA (cerebral vascular accident) ([place])   . DA (degenerative arthritis)   . RA (rheumatoid arthritis) ([facility])    Family History  Problem Relation Age of Onset  . Heart disease Father    Past Surgical History  Procedure Laterality Date  . Carotid endarterectomy    . Skin lesion excision      Dr. Sarajane Jews, and Dr. Vickki Muff at [facility]  . Marital Status: Married    Spouse Name: N/A  . Number of Children: N/A  . Years of Education: N/A   Social History Main Topics  . Smoking status: Former Smoker -- 15 years    Types: Cigarettes    Quit date: 10/07/2001  . Smokeless tobacco: Never Used     Comment: tobacco use - no  . Alcohol Use: No  . Drug Use: No  . Sexual Activity: Not Asked   Other Topics Concern  . None   Social History Narrative   Married, retired, gets regular exercise.       Cell # [id number]    Review of Systems  Constitutional:  Positive for fatigue. Negative for fever, chills, activity change, appetite change and unexpected weight change.  HENT: Positive for trouble swallowing. Negative for congestion, postnasal drip and rhinorrhea.   Eyes: Negative for visual disturbance.  Respiratory: Positive for cough. Negative for shortness of breath.   Cardiovascular: Negative for chest pain, palpitations and leg swelling.  Gastrointestinal: Negative for nausea, vomiting, abdominal pain, diarrhea, constipation and abdominal distention.  Genitourinary: Negative for dysuria, urgency and difficulty urinating.  Musculoskeletal: Positive for myalgias, back pain, arthralgias, neck pain and neck stiffness. Negative for gait problem.  Skin: Negative for color change and rash.  Neurological: Positive for weakness and light-headedness.  Hematological: Negative for adenopathy.  Psychiatric/Behavioral: Negative for sleep disturbance and dysphoric mood. The patient is not nervous/anxious.        Objective:    BP 116/75 mmHg  Pulse 83  Temp(Src) 98.3 F (36.8 C) (Oral)  Ht 5\' 4"  (1.626 m)  Wt 148 lb (67.132 kg)  BMI 25.39 kg/m2  SpO2 98% Physical Exam  Constitutional: He is oriented to person, place, and time. He appears well-developed and well-nourished. He does not appear ill. No distress.  Chronically, ill appearing, weak. Slouched in chair. Difficulty ambulating to from  chair to table and in hallway. Requires assistance.  HENT:  Head: Normocephalic and atraumatic.  Right Ear: External ear normal.  Left Ear: External ear normal.  Nose: Nose normal.  Mouth/Throat: Oropharynx is clear and moist. No oropharyngeal exudate.  Eyes: Conjunctivae and EOM are normal. Pupils are equal, round, and reactive to light. Right eye exhibits no discharge. Left eye exhibits no discharge. No scleral icterus.  Neck: Normal range of motion. Neck supple. No tracheal deviation present. No thyromegaly present.  Cardiovascular: Normal rate, regular  rhythm and normal heart sounds.  Exam reveals no gallop and no friction rub.   No murmur heard. Pulmonary/Chest: Effort normal. No accessory muscle usage. No tachypnea. No respiratory distress. He has no decreased breath sounds. He has no wheezes. He has rhonchi (Diffuse scattered). He has no rales. He exhibits no tenderness.  Musculoskeletal: He exhibits no edema.       Cervical back: He exhibits decreased range of motion, deformity and pain. He exhibits no tenderness.  Lymphadenopathy:    He has no cervical adenopathy.  Neurological: He is alert and oriented to person, place, and time. He displays atrophy. No cranial nerve deficit. Gait (unstable) abnormal. Coordination normal.  Skin: Skin is warm and dry. No rash noted. He is not diaphoretic. No erythema. No pallor.  Psychiatric: He has a normal mood and affect. His behavior is normal. Judgment and thought content normal.          Assessment & Plan:   Problem List Items Addressed This Visit      Unprioritized   COPD (chronic obstructive pulmonary disease) ([place])    Reviewed notes from pulmonary. COPD has been stable. Continue to avoid second hand smoke exposure. Continue Breo.      Dysphagia    Dysphagia over last 6 months. Progressively worsening. Will set up barium swallow for further evaluation.      Relevant Orders   DG Esophagus   Hyponatremia    Chronic hyponatremia. Will repeat Na today.      Relevant Orders   Comprehensive metabolic panel   Labile hypertension    BP Readings from Last 3 Encounters:  02/02/15 116/75  01/15/15 132/78  01/08/15 90/58   BP well controlled today. Will continue to monitor.      Spinal stenosis in cervical region - Primary    Reviewed MRI of cervical spine. Pt has been seen by neurosurgery, and second opinion in process to discuss possible surgical intervention. Continue Tylenol as needed for pain. Follow up after second opinion.          Return in about 4 weeks (around  03/02/2015) for Recheck.

## 2015-02-02 NOTE — Progress Notes (Signed)
[facility]  ROUNDING NOTE   Subjective:   Discussed case with Dr. Ronette Deter this afternoon.  Patient with increasing confusion, slurring speech and falls. Wife at bedside. Patient states he has been drinking protein shakes mixed with water recently.  He has not been taking salt tablets.   He states that he is undergoing work up for cervical spinal stenosis. He is unsure if this is also causing his weakness  Objective:  Vital signs in last 24 hours:  Temp:  [98.3 F (36.8 C)-98.4 F (36.9 C)] 98.4 F (36.9 C) (01/06 1639) Pulse Rate:  [81-83] 81 (01/06 1639) Resp:  [18] 18 (01/06 1639) BP: (116-125)/(75-76) 125/76 mmHg (01/06 1639) SpO2:  [97 %-98 %] 97 % (01/06 1639) Weight:  [67.132 kg (148 lb)] 67.132 kg (148 lb) (01/06 1639)  Weight change:  Filed Weights   02/02/15 1639  Weight: 67.132 kg (148 lb)    Intake/Output:     Intake/Output this shift:     Physical Exam: General: NAD,   Head: Normocephalic, atraumatic. Dry oral mucosal membranes  Eyes: Anicteric, PERRL  Neck: Supple, trachea midline  Lungs:  Clear to auscultation  Heart: Regular rate and rhythm  Abdomen:  Soft, nontender,   Extremities: no peripheral edema.  Neurologic: Nonfocal, moving all four extremities, strength 5/5 all four extremities.   Skin: No lesions       Basic Metabolic Panel:  Recent Labs Lab 02/02/15 1154 02/02/15 1646  NA 123* 126*  K 4.7 4.0  CL 84* 87*  CO2 34* 33*  GLUCOSE 94 118*  BUN 17 18  CREATININE 0.66 0.58*  CALCIUM 8.9 8.8*    Liver Function Tests:  Recent Labs Lab 02/02/15 1154  AST 16  ALT 9  ALKPHOS 57  BILITOT 0.5  PROT 6.7  ALBUMIN 3.9   No results for input(s): LIPASE, AMYLASE in the last 168 hours. No results for input(s): AMMONIA in the last 168 hours.  CBC:  Recent Labs Lab 02/02/15 1646  WBC 2.8*  NEUTROABS 1.5  HGB 9.6*  HCT 28.2*  MCV 128.9*  PLT 424    Cardiac Enzymes: No results for input(s):  CKTOTAL, CKMB, CKMBINDEX, TROPONINI in the last 168 hours.  BNP: Invalid input(s): POCBNP  CBG: No results for input(s): GLUCAP in the last 168 hours.  Microbiology: Results for orders placed or performed in visit on 02/13/13  Influenza A&B Antigens [facility])     Status: None   Collection Time: 02/13/13  3:07 PM  Result Value Ref Range Status   Micro Text Report   Final       COMMENT                   NEGATIVE FOR INFLUENZA A (ANTIGEN ABSENT)   COMMENT                   NEGATIVE FOR INFLUENZA B (ANTIGEN ABSENT)   ANTIBIOTIC                                                        Coagulation Studies: No results for input(s): LABPROT, INR in the last 72 hours.  Urinalysis: No results for input(s): COLORURINE, LABSPEC, PHURINE, GLUCOSEU, HGBUR, BILIRUBINUR, KETONESUR, PROTEINUR, UROBILINOGEN, NITRITE, LEUKOCYTESUR in the last 72 hours.  Invalid input(s): APPERANCEUR  Imaging: No results found.   Medications:       Assessment/ Plan:  [name] is a 75 y.o. 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   1. Hyponatremia: hypo-osmolar and euvolemic. Dry mucosal membranes are baseline for patient.  Thought to be due to adrenal insufficiency versus SIADH. When taken off fludrocortisone in the past, he has had hyperkalemia and hypotension consistent with adrenal insufficiency. However may have component of SIADH. - Goal serum sodium of >130.  - Seems that patient has been drinking protein shakes with water therefore exceeding his free water fluid restriction - Continue fludrocortisone. However due this cause patient to have elevated blood pressures at times. PRN hydralazine for elevated blood pressures. - Fluid  restriction to 1500-2049mL per day due to xerostomia/dry mouth  - Check serum and urine sodium today. Serum and urine osm.   2. History of Hyperkalemia: secondary to adrenal insufficiency. Currently with potassium of 4.  - Fludrocortisone  3. Hypotension/Hypertension: fluctuating, continue fludrocortisone for adrenal insufficiency.  - Seems to be sensitive to fludrocortisone causing high blood pressures. These blood pressure spikes are controlled with hydralazine.   4. Anemia/Thrombocytopenia: on hydroxyurea as outpatient.  - however now with macrocytic anemia.      LOS:  Lavonia Dana 1/6/20176:37 PM

## 2015-02-02 NOTE — ED Notes (Signed)
States sodium level is 123.  Blood drawn this morning by Ronette Deter, MD

## 2015-02-02 NOTE — Progress Notes (Signed)
Pre visit review using our clinic review tool, if applicable. No additional management support is needed unless otherwise documented below in the visit note. 

## 2015-02-03 ENCOUNTER — Inpatient Hospital Stay: Payer: Medicare Other

## 2015-02-03 ENCOUNTER — Encounter: Payer: Self-pay | Admitting: Radiology

## 2015-02-03 LAB — BLOOD GAS, ARTERIAL
ALLENS TEST (PASS/FAIL): POSITIVE — AB
Acid-Base Excess: 8.3 mmol/L — ABNORMAL HIGH (ref 0.0–3.0)
Bicarbonate: 31.8 mEq/L — ABNORMAL HIGH (ref 21.0–28.0)
FIO2: 0.21
O2 Saturation: 96.4 %
PATIENT TEMPERATURE: 37
PCO2 ART: 39 mmHg (ref 32.0–48.0)
PO2 ART: 76 mmHg — AB (ref 83.0–108.0)
pH, Arterial: 7.52 — ABNORMAL HIGH (ref 7.350–7.450)

## 2015-02-03 LAB — BASIC METABOLIC PANEL
ANION GAP: 8 (ref 5–15)
BUN: 14 mg/dL (ref 6–20)
CHLORIDE: 86 mmol/L — AB (ref 101–111)
CO2: 31 mmol/L (ref 22–32)
Calcium: 8.8 mg/dL — ABNORMAL LOW (ref 8.9–10.3)
Creatinine, Ser: 0.47 mg/dL — ABNORMAL LOW (ref 0.61–1.24)
GFR calc non Af Amer: >60 mL/min (ref >60)
Glucose, Bld: 94 mg/dL (ref 65–99)
POTASSIUM: 3.9 mmol/L (ref 3.5–5.1)
SODIUM: 125 mmol/L — AB (ref 135–145)

## 2015-02-03 LAB — CBC
HEMATOCRIT: 28.5 % — AB (ref 40.0–52.0)
Hemoglobin: 10.2 g/dL — ABNORMAL LOW (ref 13.0–18.0)
MCH: 45.6 pg — ABNORMAL HIGH (ref 26.0–34.0)
MCHC: 35.6 g/dL (ref 32.0–36.0)
MCV: 128.2 fL — AB (ref 80.0–100.0)
Platelets: 392 10*3/uL (ref 150–440)
RBC: 2.23 MIL/uL — AB (ref 4.40–5.90)
RDW: 14.7 % — ABNORMAL HIGH (ref 11.5–14.5)
WBC: 2.4 10*3/uL — AB (ref 3.8–10.6)

## 2015-02-03 LAB — SODIUM
SODIUM: 119 mmol/L — AB (ref 135–145)
Sodium: 120 mmol/L — ABNORMAL LOW (ref 135–145)

## 2015-02-03 MED ORDER — SODIUM CHLORIDE 1 G PO TABS
2.0000 g | ORAL_TABLET | Freq: Three times a day (TID) | ORAL | Status: DC
  Administered 2015-02-03 – 2015-02-04 (×2): 2 g via ORAL
  Filled 2015-02-03 (×8): qty 2

## 2015-02-03 MED ORDER — POTASSIUM CHLORIDE IN NACL 20-0.9 MEQ/L-% IV SOLN
INTRAVENOUS | Status: DC
  Administered 2015-02-03: 11:00:00 via INTRAVENOUS
  Filled 2015-02-03 (×2): qty 1000

## 2015-02-03 MED ORDER — IOHEXOL 350 MG/ML SOLN
75.0000 mL | Freq: Once | INTRAVENOUS | Status: AC | PRN
  Administered 2015-02-03: 75 mL via INTRAVENOUS

## 2015-02-03 NOTE — Plan of Care (Signed)
Problem: Education: Goal: Knowledge of [facility] Education information/materials will improve Outcome: Progressing Plan of care reviewed with pt and spouse. Educated pt about the importance of compliant with fluid restrictions.   Problem: Safety: Goal: Ability to remain free from injury will improve Outcome: Progressing Pt remains free of injury during the shift.  Problem: Pain Managment: Goal: General experience of comfort will improve Outcome: Progressing Pt denies pain.  Problem: Physical Regulation: Goal: Ability to maintain clinical measurements within normal limits will improve Outcome: Progressing Positive orthostatic- hypotension. Also O2 sats dropped to the 70's with standing. O2 sats up to high 90's within about a minute. MD notified, IVF initiated, CT of the chest, echo and Blood gas ordered. IVF d/c, orthostatics VS d/c. Sodium level 120. Dr Juleen China notified. 1576ml fluid restrictions and sodium serum ordered.  Problem: Fluid Volume: Goal: Ability to maintain a balanced intake and output will improve Outcome: Progressing As above.

## 2015-02-03 NOTE — Plan of Care (Signed)
Problem: Education: Goal: Knowledge of [facility] Education information/materials will improve Outcome: Progressing Oriented to unit and plan of care  Problem: Safety: Goal: Ability to remain free from injury will improve Outcome: Progressing Fall risk.  Bed alarm and frequent rounds for elimination assistance.  Pt noncompliant with using call light when needs to get up.  Reinforced need to call before getting up.  Problem: Pain Managment: Goal: General experience of comfort will improve Outcome: Progressing Pt wants more fluids to drink for dry mouth.  Gave pt ice cubes instead of water to decrease fluid consumption but allow pt to treat dry mouth frequently.

## 2015-02-03 NOTE — Evaluation (Signed)
Physical Therapy Evaluation Patient Details Name: Johnny Sanford MRN: [id number] DOB: 1940-03-20 Today's Date: 02/03/2015   History of Present Illness  Pt herw with hyponatremia, has been hasving soem sfalls and confusion recently.  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  Clinical Impression  Pt shows good effort with PT but fatigued with 150 ft of ambulation and was generally unsteady and reliant on HHA or rail use to maintain balance t/o session.  Pt with general arthritic stiffness t/o that effects posture, mobility.  Pt does not appear to want HHPT and feels he will be able to get back to his PLOF with general activity at home.      Follow Up Recommendations Home health PT (pt is not necessarily interested in HHPT)    Equipment Recommendations       Recommendations for Other Services       Precautions / Restrictions Precautions Precautions: Fall Restrictions Weight Bearing Restrictions: No      Mobility  Bed Mobility Overal bed mobility: Modified Independent             General bed mobility comments: Pt able to get to EOB using rails and needing some extra time  Transfers Overall transfer level: Modified independent Equipment used: 1 person hand held assist             General transfer comment: Pt able to rise w/o AD, but leans backward and needs light HHA to maintain balance  Ambulation/Gait Ambulation/Gait assistance: Min assist Ambulation Distance (Feet): 150 Feet Assistive device: 1 person hand held assist       General Gait Details: Pt is inconsistent and unsteady ambulation.  He has severely stiff neck/trunk posturing and leans backward too far at time.  He reports than normally he is able to walk much faster and with more confidence but is weak today.  He fatigues signficantly with the effort and needs one standing rest break.  Pt's hands were very cold so it was difficult to get an accurate pulse ox reading, but  his sats seemed to drop to the low/mid 80s with the effort? Pt was fatigued but did not have excessive SOB or labored breathing.  Stairs Stairs: Yes Stairs assistance: Min guard Stair Management: Two rails Number of Stairs: 4 General stair comments: Pt with definite need of rails, but able to go up/down with only CGA  Wheelchair Mobility    Modified Rankin (Stroke Patients Only)       Balance Overall balance assessment: Needs assistance                                           Pertinent Vitals/Pain Pain Assessment:  (general chronic pain)    Home Living Family/patient expects to be discharged to:: Private residence Living Arrangements: Spouse/significant other     Home Access: Stairs to enter Entrance Stairs-Rails: Can reach both Entrance Stairs-Number of Steps: 3   Home Equipment: Cane - single point      Prior Function Level of Independence: Independent with assistive device(s)         Comments: Pt reports that normally is walks with good confidence (using the cane) and is regularly out of the house running errands, etc     Hand Dominance        Extremity/Trunk Assessment   Upper Extremity Assessment: Generalized weakness (functional strength, limited by arthritic deformities)  Lower Extremity Assessment: Generalized weakness;Overall WFL for tasks assessed      Cervical / Trunk Assessment:  (very stiff, limited)  Communication   Communication: No difficulties  Cognition Arousal/Alertness: Awake/alert Behavior During Therapy: WFL for tasks assessed/performed Overall Cognitive Status: Within Functional Limits for tasks assessed                      General Comments      Exercises        Assessment/Plan    PT Assessment Patient needs continued PT services  PT Diagnosis Difficulty walking;Generalized weakness   PT Problem List Decreased balance;Decreased mobility;Decreased coordination;Decreased  activity tolerance;Decreased strength;Decreased safety awareness  PT Treatment Interventions Gait training;Stair training;Therapeutic activities;Functional mobility training;Therapeutic exercise;Balance training;Neuromuscular re-education   PT Goals (Current goals can be found in the Care Plan section) Acute Rehab PT Goals Patient Stated Goal: go home PT Goal Formulation: With patient Time For Goal Achievement: 02/17/15 Potential to Achieve Goals: Fair    Frequency Min 2X/week   Barriers to discharge        Co-evaluation               End of Session Equipment Utilized During Treatment: Gait belt Activity Tolerance: Patient limited by fatigue Patient left: with call bell/phone within reach;with bed alarm set           Time: 1011-1028 PT Time Calculation (min) (ACUTE ONLY): 17 min   Charges:   PT Evaluation $PT Eval Moderate Complexity: 1 Procedure     PT G Codes:       Wayne Both, PT, DPT [phone]  Kreg Shropshire 02/03/2015, 12:03 PM

## 2015-02-03 NOTE — Progress Notes (Signed)
[facility]  ROUNDING NOTE   Subjective:   Started on IV NS with KCl - then sodium dropped to 120   Objective:  Vital signs in last 24 hours:  Temp:  [97.5 F (36.4 C)-98.3 F (36.8 C)] 97.5 F (36.4 C) (01/07 0540) Pulse Rate:  [70-90] 85 (01/07 0903) Resp:  [16-19] 18 (01/07 1200) BP: (61-178)/(42-111) 134/95 mmHg (01/07 0903) SpO2:  [78 %-100 %] 98 % (01/07 1202)  Weight change:  Filed Weights   02/02/15 1639  Weight: 67.132 kg (148 lb)    Intake/Output: I/O last 3 completed shifts: In: 440 [P.O.:440] Out: 1450 [Urine:1450]   Intake/Output this shift:  Total I/O In: 891 [P.O.:540; I.V.:351] Out: 275 [Urine:275]  Physical Exam: General: NAD  Head: Normocephalic, atraumatic. Dry oral mucosal membranes  Eyes: Anicteric, PERRL  Neck: Supple, trachea midline  Lungs:  Clear to auscultation  Heart: Regular rate and rhythm  Abdomen:  Soft, nontender,   Extremities: no peripheral edema.  Neurologic: Nonfocal, moving all four extremities, strength 5/5 all four extremities.   Skin: No lesions       Basic Metabolic Panel:  Recent Labs Lab 02/02/15 1154 02/02/15 1646 02/03/15 0557 02/03/15 1433  NA 123* 126* 125* 120*  K 4.7 4.0 3.9  --   CL 84* 87* 86*  --   CO2 34* 33* 31  --   GLUCOSE 94 118* 94  --   BUN 17 18 14   --   CREATININE 0.66 0.58* 0.47*  --   CALCIUM 8.9 8.8* 8.8*  --     Liver Function Tests:  Recent Labs Lab 02/02/15 1154  AST 16  ALT 9  ALKPHOS 57  BILITOT 0.5  PROT 6.7  ALBUMIN 3.9   No results for input(s): LIPASE, AMYLASE in the last 168 hours. No results for input(s): AMMONIA in the last 168 hours.  CBC:  Recent Labs Lab 02/02/15 1646 02/03/15 0557  WBC 2.8* 2.4*  NEUTROABS 1.5  --   HGB 9.6* 10.2*  HCT 28.2* 28.5*  MCV 128.9* 128.2*  PLT 424 392    Cardiac Enzymes: No results for input(s): CKTOTAL, CKMB, CKMBINDEX, TROPONINI in the last 168 hours.  BNP: Invalid input(s): POCBNP  CBG: No  results for input(s): GLUCAP in the last 168 hours.  Microbiology: Results for orders placed or performed in visit on 02/13/13  Influenza A&B Antigens [facility])     Status: None   Collection Time: 02/13/13  3:07 PM  Result Value Ref Range Status   Micro Text Report   Final       COMMENT                   NEGATIVE FOR INFLUENZA A (ANTIGEN ABSENT)   COMMENT                   NEGATIVE FOR INFLUENZA B (ANTIGEN ABSENT)   ANTIBIOTIC                                                        Coagulation Studies: No results for input(s): LABPROT, INR in the last 72 hours.  Urinalysis:  Recent Labs  02/02/15 1852  COLORURINE YELLOW*  LABSPEC 1.019  PHURINE 6.0  GLUCOSEU NEGATIVE  HGBUR NEGATIVE  BILIRUBINUR NEGATIVE  KETONESUR NEGATIVE  PROTEINUR NEGATIVE  NITRITE NEGATIVE  LEUKOCYTESUR NEGATIVE      Imaging: Ct Angio Chest Pe W/cm &/or Wo Cm  02/03/2015  CLINICAL DATA:  Hypotension.  History of SIADH.  Hypoxia. EXAM: CT ANGIOGRAPHY CHEST WITH CONTRAST TECHNIQUE: Multidetector CT imaging of the chest was performed using the standard protocol during bolus administration of intravenous contrast. Multiplanar CT image reconstructions and MIPs were obtained to evaluate the vascular anatomy. CONTRAST:  71mL OMNIPAQUE IOHEXOL 350 MG/ML SOLN COMPARISON:  Chest CT dated 04/12/2014. Also chest CT dated 06/07/2013 and 05/24/2012. FINDINGS: Mediastinum/Lymph Nodes: Thoracic aorta is normal in caliber and configuration, with scattered atherosclerotic changes. No aortic aneurysm or dissection. No pulmonary embolism identified within the main, lobar or segmental pulmonary artery branches bilaterally. Heart size is upper normal, stable. No pericardial effusion. Coronary artery calcifications noted. Several small lymph nodes noted within the mediastinum. No mass or enlarged lymph nodes seen within the mediastinum or perihilar regions. Lungs/Pleura: Biapical scarring/fibrosis appears stable compared to previous  exams. New small nodular consolidations, some with ground-glass density, are appreciated within the right upper lobe, right lower lobe, lingula and left lower lobe. Largest on the right is seen within the inferior segment of the right upper lobe (series 6, image 55) measuring 12 x 7 mm. Largest on the left is within the medial segment of the left lower lobe measuring 13 x 9 mm (series 6, image 75). These are nonspecific and too small to definitively characterize. 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 No pleural effusion. No pneumothorax. Small emphysematous blebs again noted within each lung apex. Upper abdomen: No acute findings. Stomach moderately distended, incompletely imaged. Musculoskeletal: Mild degenerative change within the thoracic spine but no acute osseous abnormality. Review of the MIP images confirms the above findings. IMPRESSION: 1. New small nodular consolidations, some with ground-glass density, scattered throughout the right upper lobe, right lower lobe, lingula and left lower lobe. Measurements for the largest given above. These are nonspecific in appearance and too small to definitively characterize. Differential includes atypical pneumonias such as viral or fungal, interstitial pneumonias, edema related to mild volume overload/CHF, chronic interstitial diseases, hypersensitivity pneumonitis, and respiratory bronchiolitis. Neoplastic process less likely. Recommend follow-up chest CT in 3-6 months to ensure resolution. 2. No aortic aneurysm or dissection. 3. No pulmonary embolism seen. 4. Heart size is upper normal. No pericardial effusion. Coronary artery calcifications, particularly dense within the left main and left anterior descending coronary arteries. Recommend correlation with any possible associated cardiac symptoms. Electronically Signed   By: Franki Cabot M.D.   On: 02/03/2015 13:59     Medications:   Scheduled Meds: . aspirin  162 mg Oral Daily  . docusate sodium  100 mg Oral  BID  . enoxaparin (LOVENOX) injection  40 mg Subcutaneous QHS  . escitalopram  20 mg Oral Daily  . fludrocortisone  0.1 mg Oral Daily  . hydroxyurea  500 mg Oral TID WC  . levothyroxine  137 mcg Oral QAC breakfast  . mometasone-formoterol  2 puff Inhalation BID  . omega-3 acid ethyl esters  1 g Oral Daily  . pantoprazole  40 mg Oral QAC breakfast  . sodium chloride  3 mL Intravenous Q12H  . sodium chloride  1 g Oral BID WC  . vitamin B-12  1,000 mcg Oral Daily   Continuous Infusions:  PRN Meds:.acetaminophen **OR** acetaminophen, clonazePAM, fluticasone, meclizine, nystatin, ondansetron **OR** ondansetron (ZOFRAN) IV, polyethylene glycol, traZODone    Assessment/ Plan:  [name] is a 75 y.o. white male  with  hypo-osmolar euvolemic hyponatremia (adrenal insufficiency versus SIADH), pulmonary nodules, metabolic encephalopathy secondary to hyponatremia, hypertension, hypothyroidism, coronary artery disease, major depressive disorder, essential thrombocytosis presents for followup for hyponatremia.   1. Hyponatremia: hypo-osmolar and euvolemic. Dry mucosal membranes are baseline for patient.  Thought to be due to adrenal insufficiency versus SIADH.  - Goal serum sodium of >130.  - Continue fludrocortisone.  - Fluid restriction to 1500-2021mL per day due to xerostomia/dry mouth  - Check serum sodium again   2. Hypotension: continue fludrocortisone for adrenal insufficiency.   3. Anemia/Thrombocytopenia: on hydroxyurea as outpatient. Platelets currently at goal.  - however now with macrocytic anemia.   Discussed case with Dr. Ether Griffins   LOS: [place]la, [facility] 1/7/20175:22 PM

## 2015-02-03 NOTE — Progress Notes (Signed)
[place] at [place] NAME: Johnny Sanford    MR#:  [id number]  DATE OF BIRTH:  Sep 08, 1940  SUBJECTIVE:  CHIEF COMPLAINT:   Chief Complaint  Patient presents with  . low sodium     low sodium   patient is 75 year old male with past medical history significant for the. History of SIADH, who presents to the hospital with complaints of low sodium levels, 123. Apparently patient has been drinking plenty of fluids, although was recommended to be on fluid restriction. In the hospital patient was noted to be orthostatically hypotensive and IV fluids were administered. His sodium level has improved from 123-126  initially but then dropped down to 125. Patient's blood pressure drops down to 70s on standing. Patient is hypoxic while standing. He denies any discomfort  Review of Systems  Constitutional: Negative for fever, chills and weight loss.  HENT: Negative for congestion.   Eyes: Negative for blurred vision and double vision.  Respiratory: Negative for cough, sputum production, shortness of breath and wheezing.   Cardiovascular: Negative for chest pain, palpitations, orthopnea, leg swelling and PND.  Gastrointestinal: Negative for nausea, vomiting, abdominal pain, diarrhea, constipation and blood in stool.  Genitourinary: Negative for dysuria, urgency, frequency and hematuria.  Musculoskeletal: Negative for falls.  Neurological: Negative for dizziness, tremors, focal weakness and headaches.  Endo/Heme/Allergies: Does not bruise/bleed easily.  Psychiatric/Behavioral: Negative for depression. The patient does not have insomnia.     VITAL SIGNS: Blood pressure 134/95, pulse 85, temperature 97.5 F (36.4 C), temperature source Oral, resp. rate 18, height 5\' 4"  (1.626 m), weight 67.132 kg (148 lb), SpO2 98 %.  PHYSICAL EXAMINATION:   GENERAL:  75 y.o.-year-old patient lying in the bed with no acute distress. Slurring speech EYES: Pupils equal,  round, reactive to light and accommodation. No scleral icterus. Extraocular muscles intact.  HEENT: Head atraumatic, normocephalic. Oropharynx and nasopharynx clear.  NECK:  Supple, no jugular venous distention. No thyroid enlargement, no tenderness.  LUNGS: Normal breath sounds bilaterally, no wheezing, rales,rhonchi or crepitation. No use of accessory muscles of respiration.  CARDIOVASCULAR: S1, S2 normal. No murmurs, rubs, or gallops.  ABDOMEN: Soft, nontender, nondistended. Bowel sounds present. No organomegaly or mass.  EXTREMITIES: No pedal edema, cyanosis, or clubbing.  NEUROLOGIC: Cranial nerves II through XII are intact. Muscle strength 5/5 in all extremities. Sensation intact. Gait not checked.  PSYCHIATRIC: The patient is alert and oriented x 3.  SKIN: No obvious rash, lesion, or ulcer.   ORDERS/RESULTS REVIEWED:   CBC  Recent Labs Lab 02/02/15 1646 02/03/15 0557  WBC 2.8* 2.4*  HGB 9.6* 10.2*  HCT 28.2* 28.5*  PLT 424 392  MCV 128.9* 128.2*  MCH 44.2* 45.6*  MCHC 34.3 35.6  RDW 14.7* 14.7*  LYMPHSABS 0.9*  --   MONOABS 0.4  --   EOSABS 0.0  --   BASOSABS 0.0  --    ------------------------------------------------------------------------------------------------------------------  Chemistries   Recent Labs Lab 02/02/15 1154 02/02/15 1646 02/03/15 0557  NA 123* 126* 125*  K 4.7 4.0 3.9  CL 84* 87* 86*  CO2 34* 33* 31  GLUCOSE 94 118* 94  BUN 17 18 14   CREATININE 0.66 0.58* 0.47*  CALCIUM 8.9 8.8* 8.8*  AST 16  --   --   ALT 9  --   --   ALKPHOS 57  --   --   BILITOT 0.5  --   --    ------------------------------------------------------------------------------------------------------------------ estimated creatinine clearance is 68.9  mL/min (by C-G formula based on Cr of 0.47). ------------------------------------------------------------------------------------------------------------------ No results for input(s): TSH, T4TOTAL, T3FREE, THYROIDAB in  the last 72 hours.  Invalid input(s): FREET3  Cardiac Enzymes No results for input(s): CKMB, TROPONINI, MYOGLOBIN in the last 168 hours.  Invalid input(s): CK ------------------------------------------------------------------------------------------------------------------ Invalid input(s): POCBNP ---------------------------------------------------------------------------------------------------------------  RADIOLOGY: No results found.  EKG:  Orders placed or performed in visit on 03/17/14  . EKG 12-Lead    ASSESSMENT AND PLAN:  Active Problems:   Hyponatremia 1. Hyponatremia, likely due to volume depletion, as well as SIADH, continue patient on IV fluids for now due to orthostatic hypotension, following sodium levels closely, may need to initiate 3% sodium solution if no significant improvement 2. Orthostatic hypotension, continue IV fluids following orthostatic vital signs every 4 hours, improving with intravenous fluid administration 3. Leukopenia, chronic 4. Anemia, followed with rehydration 5. Generalized weakness with falls, get physical therapist involved 6. Hypoxia while standing, get echocardiogram to rule out shunt or hypertrophic cardiomyopathy, get ABGs, may need a CT angiogram of the chest to rule out PE   Management plans discussed with the patient, family and they are in agreement.   DRUG ALLERGIES:  Allergies  Allergen Reactions  . Simvastatin Anxiety    CODE STATUS:     Code Status Orders        Start     Ordered   02/02/15 2132  Full code   Continuous     02/02/15 2131      TOTAL TIME TAKING CARE OF THIS PATIENT: 45 minutes.    Theodoro Grist M.D on 02/03/2015 at 12:47 PM  Between 7am to 6pm - Pager - [phone]  After 6pm go to [email or web address] - password [email or web address] [facility]  Office  [phone]  CC: Primary care physician; Rica Mast, MD

## 2015-02-03 NOTE — Progress Notes (Signed)
Called [facility] regarding positive orthostatic blood pressure.  Pt asymptomatic although complains of nausea and given zofran IV.  Per Dr Posey Pronto the rounding physicians will address blood pressure this morning. Dorna Bloom RN

## 2015-02-04 ENCOUNTER — Inpatient Hospital Stay (HOSPITAL_COMMUNITY)
Admit: 2015-02-04 | Discharge: 2015-02-04 | Disposition: A | Discharge status: Home / Self Care | Payer: Medicare Other | Attending: Internal Medicine | Admitting: Internal Medicine

## 2015-02-04 DIAGNOSIS — I10 Essential (primary) hypertension: Secondary | ICD-10-CM

## 2015-02-04 DIAGNOSIS — J449 Chronic obstructive pulmonary disease, unspecified: Secondary | ICD-10-CM

## 2015-02-04 LAB — COMPREHENSIVE METABOLIC PANEL
ALT: 13 U/L — AB (ref 17–63)
ANION GAP: 9 (ref 5–15)
AST: 23 U/L (ref 15–41)
Albumin: 3.5 g/dL (ref 3.5–5.0)
Alkaline Phosphatase: 45 U/L (ref 38–126)
BUN: 14 mg/dL (ref 6–20)
CHLORIDE: 85 mmol/L — AB (ref 101–111)
CO2: 29 mmol/L (ref 22–32)
Calcium: 8.9 mg/dL (ref 8.9–10.3)
Creatinine, Ser: 0.5 mg/dL — ABNORMAL LOW (ref 0.61–1.24)
Glucose, Bld: 120 mg/dL — ABNORMAL HIGH (ref 65–99)
POTASSIUM: 3.6 mmol/L (ref 3.5–5.1)
SODIUM: 123 mmol/L — AB (ref 135–145)
Total Bilirubin: 0.8 mg/dL (ref 0.3–1.2)
Total Protein: 7 g/dL (ref 6.5–8.1)

## 2015-02-04 LAB — CBC
HCT: 29.3 % — ABNORMAL LOW (ref 40.0–52.0)
Hemoglobin: 10.3 g/dL — ABNORMAL LOW (ref 13.0–18.0)
MCH: 45 pg — ABNORMAL HIGH (ref 26.0–34.0)
MCHC: 35.2 g/dL (ref 32.0–36.0)
MCV: 127.7 fL — ABNORMAL HIGH (ref 80.0–100.0)
Platelets: 408 K/uL (ref 150–440)
RBC: 2.29 MIL/uL — ABNORMAL LOW (ref 4.40–5.90)
RDW: 14.7 % — ABNORMAL HIGH (ref 11.5–14.5)
WBC: 4.1 K/uL (ref 3.8–10.6)

## 2015-02-04 LAB — BRAIN NATRIURETIC PEPTIDE: B Natriuretic Peptide: 232 pg/mL — ABNORMAL HIGH (ref 0.0–100.0)

## 2015-02-04 NOTE — Progress Notes (Signed)
[place] at [place] NAME: Johnny Sanford    MR#:  [id number]  DATE OF BIRTH:  [birth year]  SUBJECTIVE:  CHIEF COMPLAINT:   Chief Complaint  Patient presents with  . low sodium     low sodium   patient is 75 year old male with past medical history significant for the. History of SIADH, who presents to the hospital with complaints of low sodium levels, 123. Apparently patient has been drinking plenty of fluids, although was recommended to be on fluid restriction. In the hospital patient was noted to be orthostatically hypotensive and IV fluids were administered. His sodium level has improved from 123-126  initially but then dropped down to 119. Patient's blood pressure drops down to 70s on standing, rehydrated, on fludrocortisone for adrenal insufficiency. Patient wasn't hypoxic while standing yesterday. The angiogram of the chest revealed nodular consolidations in the lungs , likely inflammatory, but no pulmonary embolism He denies any discomfort, patient wants to go home. Physical therapist and recommended home health physical therapy.  Sodium has improved to 123 with fluid restriction and sodium tablets.   Code blue was called for respiratory arrest,patient was cyanotic initially when found and posturing, airways were succioned, Oxygen applied, responding , more aletrt at present, no ROS, coughing, apparently just eaten piece of meatloaf.   Review of Systems  Unable to perform ROS: acuity of condition    VITAL SIGNS: Blood pressure 168/87, pulse 89, temperature 98.1 F (36.7 C), temperature source Oral, resp. rate 18, height 5\' 4"  (1.626 m), weight 67.132 kg (148 lb), SpO2 92 %.  PHYSICAL EXAMINATION:   GENERAL:  75 y.o.-year-old patient lying in the bed moderate to severe respiratory  Distress.tachypneic, coughing. EYES: Pupils equal, round, reactive to light and accommodation. No scleral icterus. Extraocular muscles intact.  HEENT: Head  atraumatic, normocephalic. Oropharynx and nasopharynx clear.  NECK:  Supple, no jugular venous distention. No thyroid enlargement, no tenderness.  LUNGS: diminished breath sounds bilaterally, no wheezing left sided rales,, fdew right sided rhonchi, no repitation.Using  accessory muscles of respiration, dyspneic, coughing.  CARDIOVASCULAR: S1, S2 normal. No murmurs, rubs, or gallops.  ABDOMEN: Soft, nontender, nondistended. Bowel sounds present. No organomegaly or mass.  EXTREMITIES: No pedal edema, cyanosis, or clubbing.  NEUROLOGIC: Cranial nerves II through XII are intact. Muscle strength 5/5 in all extremities. Sensation intact. Gait not checked.  PSYCHIATRIC: The patient is some somnolent, not able to assess orientation.  SKIN: No obvious rash, lesion, or ulcer.   ORDERS/RESULTS REVIEWED:   CBC  Recent Labs Lab 02/02/15 1646 02/03/15 0557 02/04/15 0426  WBC 2.8* 2.4* 4.1  HGB 9.6* 10.2* 10.3*  HCT 28.2* 28.5* 29.3*  PLT 424 392 408  MCV 128.9* 128.2* 127.7*  MCH 44.2* 45.6* 45.0*  MCHC 34.3 35.6 35.2  RDW 14.7* 14.7* 14.7*  LYMPHSABS 0.9*  --   --   MONOABS 0.4  --   --   EOSABS 0.0  --   --   BASOSABS 0.0  --   --    ------------------------------------------------------------------------------------------------------------------  Chemistries   Recent Labs Lab 02/02/15 1154 02/02/15 1646 02/03/15 0557 02/03/15 1433 02/03/15 1803 02/04/15 0426  NA 123* 126* 125* 120* 119* 123*  K 4.7 4.0 3.9  --   --  3.6  CL 84* 87* 86*  --   --  85*  CO2 34* 33* 31  --   --  29  GLUCOSE 94 118* 94  --   --  120*  BUN  17 18 14   --   --  14  CREATININE 0.66 0.58* 0.47*  --   --  0.50*  CALCIUM 8.9 8.8* 8.8*  --   --  8.9  AST 16  --   --   --   --  23  ALT 9  --   --   --   --  13*  ALKPHOS 57  --   --   --   --  45  BILITOT 0.5  --   --   --   --  0.8    ------------------------------------------------------------------------------------------------------------------ estimated creatinine clearance is 68.9 mL/min (by C-G formula based on Cr of 0.5). ------------------------------------------------------------------------------------------------------------------ No results for input(s): TSH, T4TOTAL, T3FREE, THYROIDAB in the last 72 hours.  Invalid input(s): FREET3  Cardiac Enzymes No results for input(s): CKMB, TROPONINI, MYOGLOBIN in the last 168 hours.  Invalid input(s): CK ------------------------------------------------------------------------------------------------------------------ Invalid input(s): POCBNP ---------------------------------------------------------------------------------------------------------------  RADIOLOGY: Ct Angio Chest Pe W/cm &/or Wo Cm  02/03/2015  CLINICAL DATA:  Hypotension.  History of SIADH.  Hypoxia. EXAM: CT ANGIOGRAPHY CHEST WITH CONTRAST TECHNIQUE: Multidetector CT imaging of the chest was performed using the standard protocol during bolus administration of intravenous contrast. Multiplanar CT image reconstructions and MIPs were obtained to evaluate the vascular anatomy. CONTRAST:  49mL OMNIPAQUE IOHEXOL 350 MG/ML SOLN COMPARISON:  Chest CT dated 04/12/2014. Also chest CT dated 06/07/2013 and 05/24/2012. FINDINGS: Mediastinum/Lymph Nodes: Thoracic aorta is normal in caliber and configuration, with scattered atherosclerotic changes. No aortic aneurysm or dissection. No pulmonary embolism identified within the main, lobar or segmental pulmonary artery branches bilaterally. Heart size is upper normal, stable. No pericardial effusion. Coronary artery calcifications noted. Several small lymph nodes noted within the mediastinum. No mass or enlarged lymph nodes seen within the mediastinum or perihilar regions. Lungs/Pleura: Biapical scarring/fibrosis appears stable compared to previous exams. New small nodular  consolidations, some with ground-glass density, are appreciated within the right upper lobe, right lower lobe, lingula and left lower lobe. Largest on the right is seen within the inferior segment of the right upper lobe (series 6, image 55) measuring 12 x 7 mm. Largest on the left is within the medial segment of the left lower lobe measuring 13 x 9 mm (series 6, image 75). These are nonspecific and too small to definitively characterize. 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 No pleural effusion. No pneumothorax. Small emphysematous blebs again noted within each lung apex. Upper abdomen: No acute findings. Stomach moderately distended, incompletely imaged. Musculoskeletal: Mild degenerative change within the thoracic spine but no acute osseous abnormality. Review of the MIP images confirms the above findings. IMPRESSION: 1. New small nodular consolidations, some with ground-glass density, scattered throughout the right upper lobe, right lower lobe, lingula and left lower lobe. Measurements for the largest given above. These are nonspecific in appearance and too small to definitively characterize. Differential includes atypical pneumonias such as viral or fungal, interstitial pneumonias, edema related to mild volume overload/CHF, chronic interstitial diseases, hypersensitivity pneumonitis, and respiratory bronchiolitis. Neoplastic process less likely. Recommend follow-up chest CT in 3-6 months to ensure resolution. 2. No aortic aneurysm or dissection. 3. No pulmonary embolism seen. 4. Heart size is upper normal. No pericardial effusion. Coronary artery calcifications, particularly dense within the left main and left anterior descending coronary arteries. Recommend correlation with any possible associated cardiac symptoms. Electronically Signed   By: Franki Cabot M.D.   On: 02/03/2015 13:59    EKG:  Orders placed or performed in visit on 03/17/14  . 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 Hyponatremia, due to  SIADH, now of IV fluids , on fluid restriction as well as sodium tablets, sodium level heen improving, follow tomorrow morning, 2. Orthostatic hypotension,  felt to be due to adrenal insufficiency, fludrocortisone 3. Leukopenia, chronic, has improved 4. Anemia, stable with rehydration 5. Generalized weakness with falls, appreciate physical therapist input, patient will be discharged likely home tomorrow with home health physical therapy 6. Hypoxia while standing, echocardiogram to rule out shunt or hypertrophic cardiomyopathy is still pending, ABGs revealed mild hypoxemia, CT angiogram of the chest showed no PE but nodular consolidations in the lungs, questionable atypical pneumonia. CT of chest in 3-6 months was recommended. Patient is afebrile, has no elevated white blood cell count in the has no cough, so pneumonia is less of a concern clinically, although could be mild fluid overload. Get BNP   Management plans discussed with the patient, family and they are in agreement.   DRUG ALLERGIES:  Allergies  Allergen Reactions  . Simvastatin Anxiety    CODE STATUS:     Code Status Orders        Start     Ordered   02/02/15 2132  Full code   Continuous     02/02/15 2131      TOTAL critical careTIME TAKING CARE OF THIS PATIENT: 35 minutes.  Discussed is Dr. Charlesetta Shanks M.D on 02/04/2015 at 1:38 PM  Between 7am to 6pm - Pager - [phone]  After 6pm go to [email or web address] - password [email or web address]  Office  [phone]  CC: Primary care physician; Rica Mast, MD

## 2015-02-04 NOTE — Progress Notes (Signed)
[place] [facility] [place] NAME: Johnny Sanford    MR#:  [id number]  DATE OF BIRTH:  [birth year]  SUBJECTIVE:  CHIEF COMPLAINT:   Chief Complaint  Patient presents with  . low sodium     low sodium   patient is 75 year old male with past medical history significant for the. History of SIADH, who presents to the hospital with complaints of low sodium levels, 123. Apparently patient has been drinking plenty of fluids, although was recommended to be on fluid restriction. In the hospital patient was noted to be orthostatically hypotensive and IV fluids were administered. His sodium level has improved from 123-126  initially but then dropped down to 119. Patient's blood pressure drops down to 70s on standing, rehydrated, on fludrocortisone for adrenal insufficiency. Patient wasn't hypoxic while standing yesterday. The angiogram of the chest revealed nodular consolidations in the lungs , likely inflammatory, but no pulmonary embolism He denies any discomfort, patient wants to go home. Physical therapist and recommended home health physical therapy.  Sodium has improved to 123 with fluid restriction and sodium tablets.   Review of Systems  Constitutional: Negative for fever, chills and weight loss.  HENT: Negative for congestion.   Eyes: Negative for blurred vision and double vision.  Respiratory: Negative for cough, sputum production, shortness of breath and wheezing.   Cardiovascular: Negative for chest pain, palpitations, orthopnea, leg swelling and PND.  Gastrointestinal: Negative for nausea, vomiting, abdominal pain, diarrhea, constipation and blood in stool.  Genitourinary: Negative for dysuria, urgency, frequency and hematuria.  Musculoskeletal: Negative for falls.  Neurological: Negative for dizziness, tremors, focal weakness and headaches.  Endo/Heme/Allergies: Does not bruise/bleed easily.  Psychiatric/Behavioral: Negative for depression. The  patient does not have insomnia.     VITAL SIGNS: Blood pressure 118/75, pulse 83, temperature 98.1 F (36.7 C), temperature source Oral, resp. rate 17, height 5\' 4"  (1.626 m), weight 67.132 kg (148 lb), SpO2 94 %.  PHYSICAL EXAMINATION:   GENERAL:  75 y.o.-year-old patient lying in the bed with no acute distress. Slurring speech EYES: Pupils equal, round, reactive to light and accommodation. No scleral icterus. Extraocular muscles intact.  HEENT: Head atraumatic, normocephalic. Oropharynx and nasopharynx clear.  NECK:  Supple, no jugular venous distention. No thyroid enlargement, no tenderness.  LUNGS: Normal breath sounds bilaterally, no wheezing, rales,rhonchi or crepitation. No use of accessory muscles of respiration.  CARDIOVASCULAR: S1, S2 normal. No murmurs, rubs, or gallops.  ABDOMEN: Soft, nontender, nondistended. Bowel sounds present. No organomegaly or mass.  EXTREMITIES: No pedal edema, cyanosis, or clubbing.  NEUROLOGIC: Cranial nerves II through XII are intact. Muscle strength 5/5 in all extremities. Sensation intact. Gait not checked.  PSYCHIATRIC: The patient is alert and oriented x 3.  SKIN: No obvious rash, lesion, or ulcer.   ORDERS/RESULTS REVIEWED:   CBC  Recent Labs Lab 02/02/15 1646 02/03/15 0557 02/04/15 0426  WBC 2.8* 2.4* 4.1  HGB 9.6* 10.2* 10.3*  HCT 28.2* 28.5* 29.3*  PLT 424 392 408  MCV 128.9* 128.2* 127.7*  MCH 44.2* 45.6* 45.0*  MCHC 34.3 35.6 35.2  RDW 14.7* 14.7* 14.7*  LYMPHSABS 0.9*  --   --   MONOABS 0.4  --   --   EOSABS 0.0  --   --   BASOSABS 0.0  --   --    ------------------------------------------------------------------------------------------------------------------  Chemistries   Recent Labs Lab 02/02/15 1154 02/02/15 1646 02/03/15 0557 02/03/15 1433 02/03/15 1803 02/04/15 0426  NA 123* 126* 125*  120* 119* 123*  K 4.7 4.0 3.9  --   --  3.6  CL 84* 87* 86*  --   --  85*  CO2 34* 33* 31  --   --  29  GLUCOSE 94 118*  94  --   --  120*  BUN 17 18 14   --   --  14  CREATININE 0.66 0.58* 0.47*  --   --  0.50*  CALCIUM 8.9 8.8* 8.8*  --   --  8.9  AST 16  --   --   --   --  23  ALT 9  --   --   --   --  13*  ALKPHOS 57  --   --   --   --  45  BILITOT 0.5  --   --   --   --  0.8   ------------------------------------------------------------------------------------------------------------------ estimated creatinine clearance is 68.9 mL/min (by C-G formula based on Cr of 0.5). ------------------------------------------------------------------------------------------------------------------ No results for input(s): TSH, T4TOTAL, T3FREE, THYROIDAB in the last 72 hours.  Invalid input(s): FREET3  Cardiac Enzymes No results for input(s): CKMB, TROPONINI, MYOGLOBIN in the last 168 hours.  Invalid input(s): CK ------------------------------------------------------------------------------------------------------------------ Invalid input(s): POCBNP ---------------------------------------------------------------------------------------------------------------  RADIOLOGY: Ct Angio Chest Pe W/cm &/or Wo Cm  02/03/2015  CLINICAL DATA:  Hypotension.  History of SIADH.  Hypoxia. EXAM: CT ANGIOGRAPHY CHEST WITH CONTRAST TECHNIQUE: Multidetector CT imaging of the chest was performed using the standard protocol during bolus administration of intravenous contrast. Multiplanar CT image reconstructions and MIPs were obtained to evaluate the vascular anatomy. CONTRAST:  76mL OMNIPAQUE IOHEXOL 350 MG/ML SOLN COMPARISON:  Chest CT dated 04/12/2014. Also chest CT dated 06/07/2013 and 05/24/2012. FINDINGS: Mediastinum/Lymph Nodes: Thoracic aorta is normal in caliber and configuration, with scattered atherosclerotic changes. No aortic aneurysm or dissection. No pulmonary embolism identified within the main, lobar or segmental pulmonary artery branches bilaterally. Heart size is upper normal, stable. No pericardial effusion. Coronary  artery calcifications noted. Several small lymph nodes noted within the mediastinum. No mass or enlarged lymph nodes seen within the mediastinum or perihilar regions. Lungs/Pleura: Biapical scarring/fibrosis appears stable compared to previous exams. New small nodular consolidations, some with ground-glass density, are appreciated within the right upper lobe, right lower lobe, lingula and left lower lobe. Largest on the right is seen within the inferior segment of the right upper lobe (series 6, image 55) measuring 12 x 7 mm. Largest on the left is within the medial segment of the left lower lobe measuring 13 x 9 mm (series 6, image 75). These are nonspecific and too small to definitively characterize. 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 No pleural effusion. No pneumothorax. Small emphysematous blebs again noted within each lung apex. Upper abdomen: No acute findings. Stomach moderately distended, incompletely imaged. Musculoskeletal: Mild degenerative change within the thoracic spine but no acute osseous abnormality. Review of the MIP images confirms the above findings. IMPRESSION: 1. New small nodular consolidations, some with ground-glass density, scattered throughout the right upper lobe, right lower lobe, lingula and left lower lobe. Measurements for the largest given above. These are nonspecific in appearance and too small to definitively characterize. Differential includes atypical pneumonias such as viral or fungal, interstitial pneumonias, edema related to mild volume overload/CHF, chronic interstitial diseases, hypersensitivity pneumonitis, and respiratory bronchiolitis. Neoplastic process less likely. Recommend follow-up chest CT in 3-6 months to ensure resolution. 2. No aortic aneurysm or dissection. 3. No pulmonary embolism seen. 4. Heart size is upper normal. No pericardial effusion.  Coronary artery calcifications, particularly dense within the left main and left anterior descending coronary arteries.  Recommend correlation with any possible associated cardiac symptoms. Electronically Signed   By: Franki Cabot M.D.   On: 02/03/2015 13:59    EKG:  Orders placed or performed in visit on 03/17/14  . EKG 12-Lead    ASSESSMENT AND PLAN:  Active Problems:   Hyponatremia 1. Hyponatremia, due to  SIADH, now of IV fluids , on fluid restriction as well as sodium tablets, sodium level has been improving, follow tomorrow morning, 2. Orthostatic hypotension,  felt to be due to adrenal insufficiency, fludrocortisone 3. Leukopenia, chronic, has improved 4. Anemia, stable with rehydration 5. Generalized weakness with falls, appreciate physical therapist input, patient will be discharged likely home tomorrow with home health physical therapy 6. Hypoxia while standing, echocardiogram to rule out shunt or hypertrophic cardiomyopathy is still pending, ABGs revealed mild hypoxemia, CT angiogram of the chest showed no PE but nodular consolidations in the lungs, questionable atypical pneumonia. CT of chest in 3-6 months was recommended. Patient is afebrile, has no elevated white blood cell count in the has no cough, so pneumonia is less of a concern clinically, although could be mild fluid overload. Get BNP   Management plans discussed with the patient, family and they are in agreement.   DRUG ALLERGIES:  Allergies  Allergen Reactions  . Simvastatin Anxiety    CODE STATUS:     Code Status Orders        Start     Ordered   02/02/15 2132  Full code   Continuous     02/02/15 2131      TOTAL TIME TAKING CARE OF THIS PATIENT: 35 minutes.  Discussed is Dr. Charlesetta Shanks M.D on 02/04/2015 at 11:43 AM  Between 7am to 6pm - Pager - [phone]  After 6pm go to [email or web address] - password [email or web address] [facility]  Office  [phone]  CC: Primary care physician; Rica Mast, MD

## 2015-02-04 NOTE — Progress Notes (Signed)
Tiffany, Development worker, community for [facility] this shift, called me into the pt's room indicating he was choking; upon entering the pt's room, the pt was sitting up in the recliner with his lunch tray in front of him; pt currently choking with large amount of food coming out of his mouth; pt unable to speak and became unresponsive and turning blue; pt full code; pt had bil carotid pulses, Code called by staff upon entering pt's room offering assistance to me; pt transferred from recliner to bed by staff; kept pt sitting straight up in bed; pt starting coughing and spitting up food; Yankur suction to mouth able to get small amount of food back; pt's O2 sats remained > 92%; pt more aroused with time and was able to speak softly telling staff he was sorry; assigned staff RN at beside tending to pt

## 2015-02-04 NOTE — Plan of Care (Signed)
Problem: Safety: Goal: Ability to remain free from injury will improve Outcome: Progressing No Falls this shift, bed and chair alarms activated   Problem: Fluid Volume: Goal: Ability to maintain a balanced intake and output will improve Outcome: Not Progressing Patient made NPO as he choked on food during shift awaiting speech eval

## 2015-02-04 NOTE — Progress Notes (Signed)
[facility]  ROUNDING NOTE   Subjective:   Seen earlier this morning. He had no complaints at that time  Code Blue called at lunch time. Seems patient aspirated.   Na 123   Objective:  Vital signs in last 24 hours:  Temp:  [98.1 F (36.7 C)-98.7 F (37.1 C)] 98.3 F (36.8 C) (01/08 1238) Pulse Rate:  [79-83] 79 (01/08 1238) Resp:  [17-19] 18 (01/08 1238) BP: (100-154)/(61-91) 100/61 mmHg (01/08 1238) SpO2:  [90 %-96 %] 96 % (01/08 1238)  Weight change:  Filed Weights   02/02/15 1639  Weight: 67.132 kg (148 lb)    Intake/Output: I/O last 3 completed shifts: In: [id number] [P.O.:1440; I.V.:351] Out: 2225 [Urine:2225]   Intake/Output this shift:  Total I/O In: 240 [P.O.:240] Out: -   Physical Exam: General: NAD  Head: Normocephalic, atraumatic. Dry oral mucosal membranes  Eyes: Anicteric, PERRL  Neck: Supple, trachea midline  Lungs:  Clear to auscultation  Heart: Regular rate and rhythm  Abdomen:  Soft, nontender,   Extremities: no peripheral edema.  Neurologic: Nonfocal, moving all four extremities, strength 5/5 all four extremities.   Skin: No lesions       Basic Metabolic Panel:  Recent Labs Lab 02/02/15 1154 02/02/15 1646 02/03/15 0557 02/03/15 1433 02/03/15 1803 02/04/15 0426  NA 123* 126* 125* 120* 119* 123*  K 4.7 4.0 3.9  --   --  3.6  CL 84* 87* 86*  --   --  85*  CO2 34* 33* 31  --   --  29  GLUCOSE 94 118* 94  --   --  120*  BUN 17 18 14   --   --  14  CREATININE 0.66 0.58* 0.47*  --   --  0.50*  CALCIUM 8.9 8.8* 8.8*  --   --  8.9    Liver Function Tests:  Recent Labs Lab 02/02/15 1154 02/04/15 0426  AST 16 23  ALT 9 13*  ALKPHOS 57 45  BILITOT 0.5 0.8  PROT 6.7 7.0  ALBUMIN 3.9 3.5   No results for input(s): LIPASE, AMYLASE in the last 168 hours. No results for input(s): AMMONIA in the last 168 hours.  CBC:  Recent Labs Lab 02/02/15 1646 02/03/15 0557 02/04/15 0426  WBC 2.8* 2.4* 4.1  NEUTROABS 1.5  --    --   HGB 9.6* 10.2* 10.3*  HCT 28.2* 28.5* 29.3*  MCV 128.9* 128.2* 127.7*  PLT 424 392 408    Cardiac Enzymes: No results for input(s): CKTOTAL, CKMB, CKMBINDEX, TROPONINI in the last 168 hours.  BNP: Invalid input(s): POCBNP  CBG: No results for input(s): GLUCAP in the last 168 hours.  Microbiology: Results for orders placed or performed in visit on 02/13/13  Influenza A&B Antigens [facility])     Status: None   Collection Time: 02/13/13  3:07 PM  Result Value Ref Range Status   Micro Text Report   Final       COMMENT                   NEGATIVE FOR INFLUENZA A (ANTIGEN ABSENT)   COMMENT                   NEGATIVE FOR INFLUENZA B (ANTIGEN ABSENT)   ANTIBIOTIC  Coagulation Studies: No results for input(s): LABPROT, INR in the last 72 hours.  Urinalysis:  Recent Labs  02/02/15 1852  COLORURINE YELLOW*  LABSPEC 1.019  PHURINE 6.0  GLUCOSEU NEGATIVE  HGBUR NEGATIVE  BILIRUBINUR NEGATIVE  KETONESUR NEGATIVE  PROTEINUR NEGATIVE  NITRITE NEGATIVE  LEUKOCYTESUR NEGATIVE      Imaging: Ct Angio Chest Pe W/cm &/or Wo Cm  02/03/2015  CLINICAL DATA:  Hypotension.  History of SIADH.  Hypoxia. EXAM: CT ANGIOGRAPHY CHEST WITH CONTRAST TECHNIQUE: Multidetector CT imaging of the chest was performed using the standard protocol during bolus administration of intravenous contrast. Multiplanar CT image reconstructions and MIPs were obtained to evaluate the vascular anatomy. CONTRAST:  35mL OMNIPAQUE IOHEXOL 350 MG/ML SOLN COMPARISON:  Chest CT dated 04/12/2014. Also chest CT dated 06/07/2013 and 05/24/2012. FINDINGS: Mediastinum/Lymph Nodes: Thoracic aorta is normal in caliber and configuration, with scattered atherosclerotic changes. No aortic aneurysm or dissection. No pulmonary embolism identified within the main, lobar or segmental pulmonary artery branches bilaterally. Heart size is upper normal, stable. No pericardial effusion.  Coronary artery calcifications noted. Several small lymph nodes noted within the mediastinum. No mass or enlarged lymph nodes seen within the mediastinum or perihilar regions. Lungs/Pleura: Biapical scarring/fibrosis appears stable compared to previous exams. New small nodular consolidations, some with ground-glass density, are appreciated within the right upper lobe, right lower lobe, lingula and left lower lobe. Largest on the right is seen within the inferior segment of the right upper lobe (series 6, image 55) measuring 12 x 7 mm. Largest on the left is within the medial segment of the left lower lobe measuring 13 x 9 mm (series 6, image 75). These are nonspecific and too small to definitively characterize. 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 No pleural effusion. No pneumothorax. Small emphysematous blebs again noted within each lung apex. Upper abdomen: No acute findings. Stomach moderately distended, incompletely imaged. Musculoskeletal: Mild degenerative change within the thoracic spine but no acute osseous abnormality. Review of the MIP images confirms the above findings. IMPRESSION: 1. New small nodular consolidations, some with ground-glass density, scattered throughout the right upper lobe, right lower lobe, lingula and left lower lobe. Measurements for the largest given above. These are nonspecific in appearance and too small to definitively characterize. Differential includes atypical pneumonias such as viral or fungal, interstitial pneumonias, edema related to mild volume overload/CHF, chronic interstitial diseases, hypersensitivity pneumonitis, and respiratory bronchiolitis. Neoplastic process less likely. Recommend follow-up chest CT in 3-6 months to ensure resolution. 2. No aortic aneurysm or dissection. 3. No pulmonary embolism seen. 4. Heart size is upper normal. No pericardial effusion. Coronary artery calcifications, particularly dense within the left main and left anterior descending coronary  arteries. Recommend correlation with any possible associated cardiac symptoms. Electronically Signed   By: Franki Cabot M.D.   On: 02/03/2015 13:59     Medications:   Scheduled Meds: . aspirin  162 mg Oral Daily  . docusate sodium  100 mg Oral BID  . enoxaparin (LOVENOX) injection  40 mg Subcutaneous QHS  . escitalopram  20 mg Oral Daily  . fludrocortisone  0.1 mg Oral Daily  . hydroxyurea  500 mg Oral TID WC  . levothyroxine  137 mcg Oral QAC breakfast  . mometasone-formoterol  2 puff Inhalation BID  . omega-3 acid ethyl esters  1 g Oral Daily  . pantoprazole  40 mg Oral QAC breakfast  . sodium chloride  3 mL Intravenous Q12H  . sodium chloride  2 g Oral TID WC  .  vitamin B-12  1,000 mcg Oral Daily   Continuous Infusions:  PRN Meds:.acetaminophen **OR** acetaminophen, clonazePAM, fluticasone, meclizine, nystatin, ondansetron **OR** ondansetron (ZOFRAN) IV, polyethylene glycol, traZODone    Assessment/ Plan:  [name] is a 75 y.o. 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   1. Hyponatremia: hypo-osmolar and euvolemic. Dry mucosal membranes are baseline for patient.  Thought to be due to adrenal insufficiency versus SIADH.  - Goal serum sodium of >130.  - Continue fludrocortisone.  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 Hypotension: continue fludrocortisone for adrenal insufficiency.   3. Anemia/Thrombocytopenia: hemoglobin 10.3 on hydroxyurea as outpatient. Platelets currently at goal.  - however now with macrocytic anemia.   Discussed case with Dr. Ether Griffins   LOS: [place], [facility] 1/8/20171:06 PM

## 2015-02-04 NOTE — Progress Notes (Signed)
Spoke with Dr. Judeen Hammans, told her per wife Johnny Sanford patient had choked several times in past and that his primary MD Dr Gilford Rile had planned to order a swallow study with dye, Dr Judeen Hammans acknowledged call and will place order for modified Barium swallow and per order patient is NPO until Speech had seen and evaluated

## 2015-02-04 NOTE — Progress Notes (Signed)
Spoke with Dr Lavonia Dana, let him know per wife that he had choked several times in past at home and she had said the his primary MD had planned on ordering a Barium Swallow study.  He acknowledged, no new orders

## 2015-02-04 NOTE — Progress Notes (Signed)
Called Dr Juleen China about NPO status and being unable to give oral sodium, he said that was ok he will order labs for the am

## 2015-02-04 NOTE — ED Provider Notes (Signed)
[facility] Department of Emergency Medicine   Code Blue CONSULT NOTE  Chief Complaint: Cardiac arrest/unresponsive     History of present illness: I was contacted by the hospital for a CODE BLUE cardiac arrest upstairs and presented to the patient's bedside.  Patient noted unresponsive by nurse after choking on lunch. Initiated suction and oxygen with rapid improvement in patient's symptoms.   Responding appropriately. Primary team at bedside. No further intervention required by me.      Lavonia Drafts, MD 02/04/15 1314

## 2015-02-05 ENCOUNTER — Inpatient Hospital Stay: Payer: Medicare Other

## 2015-02-05 LAB — BASIC METABOLIC PANEL
Anion gap: 7 (ref 5–15)
BUN: 16 mg/dL (ref 6–20)
CHLORIDE: 85 mmol/L — AB (ref 101–111)
CO2: 29 mmol/L (ref 22–32)
CREATININE: 0.53 mg/dL — AB (ref 0.61–1.24)
Calcium: 8.4 mg/dL — ABNORMAL LOW (ref 8.9–10.3)
GFR calc non Af Amer: >60 mL/min (ref >60)
GLUCOSE: 98 mg/dL (ref 65–99)
Potassium: 3.7 mmol/L (ref 3.5–5.1)
Sodium: 121 mmol/L — ABNORMAL LOW (ref 135–145)

## 2015-02-05 MED ORDER — JEVITY 1.5 CAL/FIBER PO LIQD
1000.0000 mL | ORAL | Status: DC
  Administered 2015-02-05: 1000 mL

## 2015-02-05 MED ORDER — FREE WATER
30.0000 mL | Status: DC
  Administered 2015-02-05 – 2015-02-07 (×11): 30 mL

## 2015-02-05 MED FILL — Medication: Qty: 1 | Status: AC

## 2015-02-05 NOTE — Care Management Important Message (Signed)
Important Message  Patient Details  Name: Johnny Sanford MRN: [id number] Date of Birth: [birth year]   Medicare Important Message Given:  Yes    Johnny Sanford 02/05/2015, 11:34 AM

## 2015-02-05 NOTE — Care Management (Signed)
Admitted to [facility] with the diagnosis of hyponatremia. Lives with wife, [name], [phone] or [phone]). Son is Simona Huh [phone]). Seen Dr. Ronette Deter last Friday. Fell last Thursday and 01/11/15 per wife. No home health. No skilled facility. No home oxygen. Good appetite. Takes care of all basic activities of daily living himself, drives. Uses canes to aid in ambulation. Family will transport. Shelbie Ammons RN MSN CCM Care Management [phone]

## 2015-02-05 NOTE — Progress Notes (Signed)
[facility] NAME: Johnny Sanford    MR#:  [id number]  DATE OF BIRTH:  [birth year]  SUBJECTIVE:  CHIEF COMPLAINT:   Chief Complaint  Patient presents with  . low sodium     low sodium   The patient is 75 year old male with past medical history significant for the. History of SIADH, who presents to the hospital with complaints of low sodium levels, 123. Apparently patient has been drinking plenty of fluids, although was recommended to be on fluid restriction. In the hospital patient was noted to be orthostatically hypotensive and IV fluids were administered. His sodium level has improved from 123-126  initially but then dropped down to 119. Patient's blood pressure drops down to 70s on standing, rehydrated, on fludrocortisone for adrenal insufficiency. Patient was hypoxic while standing yesterday. The angiogram of the chest revealed nodular consolidations in the lungs , likely inflammatory, but no pulmonary embolism He denies any discomfort, patient wants to go home. Physical therapist and recommended home health physical therapy.  Sodium has improved to 123 with fluid restriction and sodium tablets. Aspirated yesterday and the CODE BLUE was called for respiratory arrest. Patient's airways were suctioned oxygen applied and he became responsive. Modified barium barium swallowing study was ordered, however, not performed due to broken equipment. Barium swallowing study then was ordered, however, patient aspirated barium. NG tube is being placed for patient to initiate tube feeds and by mouth order was placed.   Review of Systems  Unable to perform ROS: acuity of condition    VITAL SIGNS: Blood pressure 101/72, pulse 75, temperature 98.8 F (37.1 C), temperature source Oral, resp. rate 18, height 5\' 4"  (1.626 m), weight 67.132 kg (148 lb), SpO2 95 %.  PHYSICAL EXAMINATION:   GENERAL:  75 y.o.-year-old patient lying in the bed moderate to mild  respiratory  Distress.tachypneic, coughing. Hoarseness was noted EYES: Pupils equal, round, reactive to light and accommodation. No scleral icterus. Extraocular muscles intact.  HEENT: Head atraumatic, normocephalic. Oropharynx and nasopharynx clear.  NECK:  Supple, no jugular venous distention. No thyroid enlargement, no tenderness.  LUNGS: diminished breath sounds bilaterally, no wheezing,  left sided rales,, few right sided rhonchi, no crepitation.intermittently using accessory muscles of respiration, dyspneic, coughing.  CARDIOVASCULAR: S1, S2 normal. No murmurs, rubs, or gallops.  ABDOMEN: Soft, nontender, nondistended. Bowel sounds present. No organomegaly or mass.  EXTREMITIES: No pedal edema, cyanosis, or clubbing.  NEUROLOGIC: Cranial nerves II through XII are intact. Muscle strength 5/5 in all extremities. Sensation intact. Gait not checked.  PSYCHIATRIC: The patient is some somnolent, not able to assess orientation.  SKIN: No obvious rash, lesion, or ulcer.   ORDERS/RESULTS REVIEWED:   CBC  Recent Labs Lab 02/02/15 1646 02/03/15 0557 02/04/15 0426  WBC 2.8* 2.4* 4.1  HGB 9.6* 10.2* 10.3*  HCT 28.2* 28.5* 29.3*  PLT 424 392 408  MCV 128.9* 128.2* 127.7*  MCH 44.2* 45.6* 45.0*  MCHC 34.3 35.6 35.2  RDW 14.7* 14.7* 14.7*  LYMPHSABS 0.9*  --   --   MONOABS 0.4  --   --   EOSABS 0.0  --   --   BASOSABS 0.0  --   --    ------------------------------------------------------------------------------------------------------------------  Chemistries   Recent Labs Lab 02/02/15 1154 02/02/15 1646 02/03/15 0557 02/03/15 1433 02/03/15 1803 02/04/15 0426 02/05/15 0502  NA 123* 126* 125* 120* 119* 123* 121*  K 4.7 4.0 3.9  --   --  3.6 3.7  CL 84*  87* 86*  --   --  85* 85*  CO2 34* 33* 31  --   --  29 29  GLUCOSE 94 118* 94  --   --  120* 98  BUN 17 18 14   --   --  14 16  CREATININE 0.66 0.58* 0.47*  --   --  0.50* 0.53*  CALCIUM 8.9 8.8* 8.8*  --   --  8.9 8.4*  AST  16  --   --   --   --  23  --   ALT 9  --   --   --   --  13*  --   ALKPHOS 57  --   --   --   --  45  --   BILITOT 0.5  --   --   --   --  0.8  --    ------------------------------------------------------------------------------------------------------------------ estimated creatinine clearance is 68.9 mL/min (by C-G formula based on Cr of 0.53). ------------------------------------------------------------------------------------------------------------------ No results for input(s): TSH, T4TOTAL, T3FREE, THYROIDAB in the last 72 hours.  Invalid input(s): FREET3  Cardiac Enzymes No results for input(s): CKMB, TROPONINI, MYOGLOBIN in the last 168 hours.  Invalid input(s): CK ------------------------------------------------------------------------------------------------------------------ Invalid input(s): POCBNP ---------------------------------------------------------------------------------------------------------------  RADIOLOGY: Dg Esophagus  02/05/2015  CLINICAL DATA:  Six-month history of dysphagia especially to solids, cough, weight loss. EXAM: ESOPHOGRAM/BARIUM SWALLOW TECHNIQUE: Single contrast examination was performed using thin barium. A barium tablet was administered. FLUOROSCOPY TIME:  Fluoroscopy Time:  1 minutes, 12 seconds. Number of Acquired Images:  10 COMPARISON:  Chest x-ray of February 05, 2015 FINDINGS: Upon the initial ingestion of thick barium there was prompt laryngeal penetration with extension of barium into the trachea and the bronchi. This elicited a mild cough. Some barium did enter the esophagus allowing visualization of the structure. The cervical esophagus appeared to distend well. No significant mass effect upon the cervical esophagus was observed. The thoracic esophagus distended well. Under fluoroscopic observation the lower cervical esophagus and appeared to distend reasonably well. No mucosal irregularity was observed. An attempt was made to have the patient  ingest a barium tablet. However, he was not able to propel the tablet into the posterior oropharynx with the tongue and had to spit out the tablet. IMPRESSION: 1. This is a quite limited study due to a large amount of aspiration of the barium preparation. A double-contrast study had been planned but only a very limited single contrast study could be performed following the large amount of aspiration. The epiglottic function appears disordered and the motions of the tongue propelling the tablet as well as the liquid barium into the posterior oropharynx is disordered. 2. No mass-effect upon the proximal esophagus by the cervical spine is observed. 3. The thoracic esophagus is grossly normal for age with only mild changes of presbyesophagus. Electronically Signed   By: David  Martinique M.D.   On: 02/05/2015 10:47   Dg Chest Port 1 View  02/05/2015  CLINICAL DATA:  75 year old male with aspiration. Hypoxia. Initial encounter. EXAM: PORTABLE CHEST 1 VIEW COMPARISON:  Chest CTA 02/03/2015 and earlier. FINDINGS: Portable AP upright view at 0610 hours. Stable lung volumes since March 2016. Calcified aortic atherosclerosis. Stable cardiac size and mediastinal contours. Visualized tracheal air column is within normal limits. No pneumothorax, pulmonary edema or pleural effusion. Chronic streaky left infrahilar opacity. Radiographically, no acute pulmonary opacity. IMPRESSION: Stable radiographic appearance of the chest since 2016, no acute aspiration suspected. See also report of the recent chest CTA 02/03/2015. Electronically  Signed   By: Genevie Ann M.D.   On: 02/05/2015 07:53    EKG:  Orders placed or performed in visit on 03/17/14  . EKG 12-Lead    ASSESSMENT AND PLAN:  Active Problems:   Hyponatremia 1 acute respiratory failure with hypoxia, aspiration related, unremarkable post aspiration chest xray, not in O2, was relatively clinically, however, aspirated again today barium during barium swallowing study ,  patient will be placed nothing by mouth and NG tube feeds will be initiated after tube is placed , initiate antibiotics if febrile,   2 dysphagia, NPO, SLP, NG tube will be placed today and tube feeds will be initiated by dietary, getting get brain MRI as well as cervical spine MRI to rule out significant pathology 3. Hyponatremia, due to  SIADH, now of IV fluids , on fluid restriction as well as sodium tablets, sodium level been fluctuating, follow tomorrow morning, 4. Orthostatic hypotension,  felt to be due to adrenal insufficiency, fludrocortisone 5. Leukopenia, chronic, has improved 6. Anemia, stable with rehydration 7. Generalized weakness with falls, appreciate physical therapist input, patient will be discharged likely home with home health physical therapy 8. Hypoxia while standing, echocardiogram showed no shunt or hypertrophic cardiomyopathy , ABGs revealed mild hypoxemia, CT angiogram of the chest showed no PE but nodular consolidations in the lungs, questionable atypical pneumonia, questionable chronic aspirations. CT of chest in 3-6 months was recommended. Patient is afebrile, has no elevated white blood cell count, but now since he aspirated. He has been coughing, watch for possible aspiration pneumonitis .  BNP is minimally elevated at 232   Management plans discussed with the patient, family and they are in agreement.   DRUG ALLERGIES:  Allergies  Allergen Reactions  . Simvastatin Anxiety    CODE STATUS:     Code Status Orders        Start     Ordered   02/02/15 2132  Full code   Continuous     02/02/15 2131      TOTALTIME TAKING CARE OF THIS PATIENT: 50 minutes.  Discussed with patient's wife, has been approximately 10-15 minutes  Wister Hoefle M.D on 02/05/2015 at 2:55 PM  Between 7am to 6pm - Pager - [phone]  After 6pm go to [email or web address] - password [email or web address]  Office  [phone]  CC: Primary care physician;  Rica Mast, MD

## 2015-02-05 NOTE — Progress Notes (Signed)
Checked placement and air pop heard.  Checked residual and no return.  Increased tube feed to 30 cc/hour. Pt has no complaints of abdominal pain, no nausea or vomiting.  Abdomen soft, nontender. Dorna Bloom RN

## 2015-02-05 NOTE — Plan of Care (Signed)
Problem: Safety: Goal: Ability to remain free from injury will improve Outcome: Progressing No injury this shift  Problem: Pain Managment: Goal: General experience of comfort will improve Outcome: Progressing Denies pain

## 2015-02-05 NOTE — Progress Notes (Signed)
[facility]  ROUNDING NOTE   Subjective:   NA 121 Patient now has NG tube   Objective:  Vital signs in last 24 hours:  Temp:  [97.7 F (36.5 C)-98.8 F (37.1 C)] 98.8 F (37.1 C) (01/09 0900) Pulse Rate:  [72-78] 78 (01/09 1626) Resp:  [18] 18 (01/09 1626) BP: (101-137)/(63-88) 137/63 mmHg (01/09 1626) SpO2:  [92 %-95 %] 95 % (01/09 1626)  Weight change:  Filed Weights   02/02/15 1639  Weight: 67.132 kg (148 lb)    Intake/Output: I/O last 3 completed shifts: In: 72 [P.O.:460] Out: 650 [Urine:650]   Intake/Output this shift:  Total I/O In: -  Out: 125 [Urine:125]  Physical Exam: General: NAD  Head: Normocephalic, atraumatic. Dry oral mucosal membranes  Eyes/ENT: Anicteric, NGT  Neck: Supple, trachea midline  Lungs:  Clear to auscultation  Heart: Regular rate and rhythm  Abdomen:  Soft, nontender,   Extremities: no peripheral edema.  Neurologic: Nonfocal, moving all four extremities, strength 5/5 all four extremities.   Skin: No lesions       Basic Metabolic Panel:  Recent Labs Lab 02/02/15 1154 02/02/15 1646 02/03/15 0557 02/03/15 1433 02/03/15 1803 02/04/15 0426 02/05/15 0502  NA 123* 126* 125* 120* 119* 123* 121*  K 4.7 4.0 3.9  --   --  3.6 3.7  CL 84* 87* 86*  --   --  85* 85*  CO2 34* 33* 31  --   --  29 29  GLUCOSE 94 118* 94  --   --  120* 98  BUN 17 18 14   --   --  14 16  CREATININE 0.66 0.58* 0.47*  --   --  0.50* 0.53*  CALCIUM 8.9 8.8* 8.8*  --   --  8.9 8.4*    Liver Function Tests:  Recent Labs Lab 02/02/15 1154 02/04/15 0426  AST 16 23  ALT 9 13*  ALKPHOS 57 45  BILITOT 0.5 0.8  PROT 6.7 7.0  ALBUMIN 3.9 3.5   No results for input(s): LIPASE, AMYLASE in the last 168 hours. No results for input(s): AMMONIA in the last 168 hours.  CBC:  Recent Labs Lab 02/02/15 1646 02/03/15 0557 02/04/15 0426  WBC 2.8* 2.4* 4.1  NEUTROABS 1.5  --   --   HGB 9.6* 10.2* 10.3*  HCT 28.2* 28.5* 29.3*  MCV 128.9*  128.2* 127.7*  PLT 424 392 408    Cardiac Enzymes: No results for input(s): CKTOTAL, CKMB, CKMBINDEX, TROPONINI in the last 168 hours.  BNP: Invalid input(s): POCBNP  CBG: No results for input(s): GLUCAP in the last 168 hours.  Microbiology: Results for orders placed or performed in visit on 02/13/13  Influenza A&B Antigens [facility])     Status: None   Collection Time: 02/13/13  3:07 PM  Result Value Ref Range Status   Micro Text Report   Final       COMMENT                   NEGATIVE FOR INFLUENZA A (ANTIGEN ABSENT)   COMMENT                   NEGATIVE FOR INFLUENZA B (ANTIGEN ABSENT)   ANTIBIOTIC  Coagulation Studies: No results for input(s): LABPROT, INR in the last 72 hours.  Urinalysis:  Recent Labs  02/02/15 1852  COLORURINE YELLOW*  LABSPEC 1.019  PHURINE 6.0  GLUCOSEU NEGATIVE  HGBUR NEGATIVE  BILIRUBINUR NEGATIVE  KETONESUR NEGATIVE  PROTEINUR NEGATIVE  NITRITE NEGATIVE  LEUKOCYTESUR NEGATIVE      Imaging: Dg Abd 1 View  02/05/2015  CLINICAL DATA:  Encounter for NG tube placement EXAM: ABDOMEN - 1 VIEW COMPARISON:  None. FINDINGS: NG tube is in place with the tip in the mid stomach. Nonobstructive bowel gas pattern. IMPRESSION: NG tube tip in the mid stomach. Electronically Signed   By: Rolm Baptise M.D.   On: 02/05/2015 15:38   Dg Esophagus  02/05/2015  CLINICAL DATA:  Six-month history of dysphagia especially to solids, cough, weight loss. EXAM: ESOPHOGRAM/BARIUM SWALLOW TECHNIQUE: Single contrast examination was performed using thin barium. A barium tablet was administered. FLUOROSCOPY TIME:  Fluoroscopy Time:  1 minutes, 12 seconds. Number of Acquired Images:  10 COMPARISON:  Chest x-ray of February 05, 2015 FINDINGS: Upon the initial ingestion of thick barium there was prompt laryngeal penetration with extension of barium into the trachea and the bronchi. This elicited a mild cough. Some barium did  enter the esophagus allowing visualization of the structure. The cervical esophagus appeared to distend well. No significant mass effect upon the cervical esophagus was observed. The thoracic esophagus distended well. Under fluoroscopic observation the lower cervical esophagus and appeared to distend reasonably well. No mucosal irregularity was observed. An attempt was made to have the patient ingest a barium tablet. However, he was not able to propel the tablet into the posterior oropharynx with the tongue and had to spit out the tablet. IMPRESSION: 1. This is a quite limited study due to a large amount of aspiration of the barium preparation. A double-contrast study had been planned but only a very limited single contrast study could be performed following the large amount of aspiration. The epiglottic function appears disordered and the motions of the tongue propelling the tablet as well as the liquid barium into the posterior oropharynx is disordered. 2. No mass-effect upon the proximal esophagus by the cervical spine is observed. 3. The thoracic esophagus is grossly normal for age with only mild changes of presbyesophagus. Electronically Signed   By: David  Martinique M.D.   On: 02/05/2015 10:47   Dg Chest Port 1 View  02/05/2015  CLINICAL DATA:  75 year old male with aspiration. Hypoxia. Initial encounter. EXAM: PORTABLE CHEST 1 VIEW COMPARISON:  Chest CTA 02/03/2015 and earlier. FINDINGS: Portable AP upright view at 0610 hours. Stable lung volumes since March 2016. Calcified aortic atherosclerosis. Stable cardiac size and mediastinal contours. Visualized tracheal air column is within normal limits. No pneumothorax, pulmonary edema or pleural effusion. Chronic streaky left infrahilar opacity. Radiographically, no acute pulmonary opacity. IMPRESSION: Stable radiographic appearance of the chest since 2016, no acute aspiration suspected. See also report of the recent chest CTA 02/03/2015. Electronically Signed    By: Genevie Ann M.D.   On: 02/05/2015 07:53     Medications:   Scheduled Meds: . aspirin  162 mg Oral Daily  . docusate sodium  100 mg Oral BID  . enoxaparin (LOVENOX) injection  40 mg Subcutaneous QHS  . escitalopram  20 mg Oral Daily  . fludrocortisone  0.1 mg Oral Daily  . free water  30 mL Per Tube Q4H  . hydroxyurea  500 mg Oral TID WC  . levothyroxine  137 mcg Oral QAC  breakfast  . mometasone-formoterol  2 puff Inhalation BID  . omega-3 acid ethyl esters  1 g Oral Daily  . pantoprazole  40 mg Oral QAC breakfast  . sodium chloride  3 mL Intravenous Q12H  . vitamin B-12  1,000 mcg Oral Daily   Continuous Infusions: . feeding supplement (JEVITY 1.5 CAL/FIBER)     PRN Meds:.acetaminophen **OR** acetaminophen, clonazePAM, fluticasone, meclizine, nystatin, ondansetron **OR** ondansetron (ZOFRAN) IV, polyethylene glycol, traZODone    Assessment/ Plan:  [name] is a 75 y.o. 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   1. Hyponatremia: hypo-osmolar and euvolemic. Dry mucosal membranes are baseline for patient.    - Goal serum sodium of >130.  - Continue fludrocortisone.  -  Serial sodium checks - reassess after TF are started  2. Hypotension: continue fludrocortisone for adrenal insufficiency.   3. Anemia/Thrombocytopenia: hemoglobin 10.3 on hydroxyurea as outpatient. Platelets currently at goal.  - however now with macrocytic anemia.      LOS: 3 Vergia Chea 1/9/20175:16 PM

## 2015-02-05 NOTE — Plan of Care (Signed)
Problem: Physical Regulation: Goal: Ability to maintain clinical measurements within normal limits will improve Outcome: Progressing Pt aspirated today during Barium swallow test. Pt remains NPO. NG tube -Dobhoff inserted, feeding via tube initiated.

## 2015-02-05 NOTE — Progress Notes (Signed)
Speech Therapy Note: received order, reviewed chart notes which indicated pt has had some degree of Dysphagia for ~6 mos. which pt stated was progressing. Primary MD had rec'd a Barium (GI) study based on pt's presentation and s/s.  Pt is now admitted and was found to be choking "with large amount of food coming out of his mouth" yesterday during meal. Pt is currently NPO. Upon attempted a MBSS which was ordered by Hospitalist, the test was unable to be completed sec. to equipment issues. Pt was then ordered to have a Barium study w/ the Radiologist assessing the pharyngeal-laryngeal phase of swallowing. Per report, the Radiologist viewed immediate aspiration of the Barium liquid.  Hospitalist made aware of the study results and recommendation for alternative means of feeding at this time if appropriate d/t the degree of aspiration and his high risk for aspiration; Hospitalist agreed. ST will f/u next 1-3 days for continued assessment of the swallow function - rec. MBSS when appropriate to perform. Rec. Oral care frequently during the day. NSG updated.

## 2015-02-05 NOTE — Progress Notes (Signed)
Initial Nutrition Assessment      INTERVENTION:  EN: recommend jevity 1.5 at 7ml/hr to provide 1800 kcals 76 g of protein and 912 ml free water.  Recommend minimal free water flush of 1ml q 4 hr for tube maintenance and low na.  Recommend starting tube feeding at 17ml/hr at this time.    NUTRITION DIAGNOSIS:   Inadequate oral intake related to dysphagia as evidenced by NPO status.    GOAL:   Patient will meet greater than or equal to 90% of their needs    MONITOR:    (Energy intake, Electrolyte and renal profile, Digestive system)  REASON FOR ASSESSMENT:   Consult Enteral/tube feeding initiation and management  ASSESSMENT:      Pt admitted with hyponatremia, hypoxia possible aspiration, dysphagia for the past 6 months with progression.  Noted episodes of choking during admission.  SLP evaluted and recommend NPO with alternative means of nutrition  Past Medical History  Diagnosis Date  . HLD (hyperlipidemia)   . HTN (hypertension)   . GERD (gastroesophageal reflux disease)   . Hypothyroidism   . 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 CVA (cerebral vascular accident) ([place])   . DA (degenerative arthritis)   . RA (rheumatoid arthritis) ([place])   . Adrenal insufficiency ([place])   . Cervical spinal stenosis   . Falls   . Collagen vascular disease ([place])     Current Nutrition: NPO  Food/Nutrition-Related History: unable to determine at this time   Scheduled Medications:  . aspirin  162 mg Oral Daily  . docusate sodium  100 mg Oral BID  . enoxaparin (LOVENOX) injection  40 mg Subcutaneous QHS  . escitalopram  20 mg Oral Daily  . fludrocortisone  0.1 mg Oral Daily  . hydroxyurea  500 mg Oral TID WC  . levothyroxine  137 mcg Oral QAC breakfast  . mometasone-formoterol  2 puff Inhalation BID  . omega-3 acid ethyl esters  1 g Oral Daily  . pantoprazole  40 mg Oral QAC breakfast  . sodium chloride  3 mL Intravenous Q12H  . sodium chloride  2 g Oral TID WC  .  vitamin B-12  1,000 mcg Oral Daily       Electrolyte/Renal Profile and Glucose Profile:   Recent Labs Lab 02/03/15 0557  02/03/15 1803 02/04/15 0426 02/05/15 0502  NA 125*  < > 119* 123* 121*  K 3.9  --   --  3.6 3.7  CL 86*  --   --  85* 85*  CO2 31  --   --  29 29  BUN 14  --   --  14 16  CREATININE 0.47*  --   --  0.50* 0.53*  CALCIUM 8.8*  --   --  8.9 8.4*  GLUCOSE 94  --   --  120* 98  < > = values in this interval not displayed. Protein Profile:  Recent Labs Lab 02/02/15 1154 02/04/15 0426  ALBUMIN 3.9 3.5    Gastrointestinal Profile: Last BM:01/08   Nutrition-Focused Physical Exam Findings:  Unable to complete Nutrition-Focused physical exam at this time.     Weight Change: 4% wt loss in the last 7 months per wt encounters    Diet Order:  Diet NPO time specified  Skin:   reviewed   Height:   Ht Readings from Last 1 Encounters:  02/02/15 5\' 4"  (1.626 m)    Weight:   Wt Readings from Last 1 Encounters:  02/02/15  148 lb (67.132 kg)    Ideal Body Weight:     BMI:  Body mass index is 25.39 kg/(m^2).  Estimated Nutritional Needs:   Kcal:  BEE 1326 kcals (IF 1.0-1.3, AF 1.3) [id number] kcals/d  Protein:  (1.0-1.2 g/kg) 67-80 g/d   Fluid:  (25-30ml/kg) 1675-2055ml/d  EDUCATION NEEDS:   No education needs identified at this time  HIGH Care Level  Hermenia Fritcher B. Zenia Resides, [place], [place] (pager) Weekend/On-Call pager [phone])

## 2015-02-05 NOTE — Progress Notes (Signed)
PT Cancellation Note  Patient Details Name: Johnny Sanford MRN: [id number] DOB: [birth year]   Cancelled Treatment:    Reason Eval/Treat Not Completed: Other (comment) (Nursing recommending holding PT today (recommending holding exertional activity today)).  Will re-attempt PT treatment at a later date/time.   [name] 02/05/2015, 2:47 PM Leitha Bleak, [place]

## 2015-02-06 ENCOUNTER — Inpatient Hospital Stay: Payer: Medicare Other

## 2015-02-06 LAB — CBC
HEMATOCRIT: 27.9 % — AB (ref 40.0–52.0)
HEMOGLOBIN: 9.7 g/dL — AB (ref 13.0–18.0)
MCH: 44.4 pg — ABNORMAL HIGH (ref 26.0–34.0)
MCHC: 34.7 g/dL (ref 32.0–36.0)
MCV: 127.8 fL — ABNORMAL HIGH (ref 80.0–100.0)
Platelets: 381 10*3/uL (ref 150–440)
RBC: 2.19 MIL/uL — AB (ref 4.40–5.90)
RDW: 14.8 % — ABNORMAL HIGH (ref 11.5–14.5)
WBC: 3.2 10*3/uL — ABNORMAL LOW (ref 3.8–10.6)

## 2015-02-06 LAB — BASIC METABOLIC PANEL
ANION GAP: 6 (ref 5–15)
BUN: 19 mg/dL (ref 6–20)
CALCIUM: 8.8 mg/dL — AB (ref 8.9–10.3)
CO2: 33 mmol/L — ABNORMAL HIGH (ref 22–32)
Chloride: 88 mmol/L — ABNORMAL LOW (ref 101–111)
Creatinine, Ser: 0.61 mg/dL (ref 0.61–1.24)
GLUCOSE: 112 mg/dL — AB (ref 65–99)
POTASSIUM: 3.5 mmol/L (ref 3.5–5.1)
Sodium: 127 mmol/L — ABNORMAL LOW (ref 135–145)

## 2015-02-06 MED ORDER — IPRATROPIUM-ALBUTEROL 0.5-2.5 (3) MG/3ML IN SOLN
3.0000 mL | RESPIRATORY_TRACT | Status: DC
  Administered 2015-02-06 – 2015-02-10 (×23): 3 mL via RESPIRATORY_TRACT
  Filled 2015-02-06 (×25): qty 3

## 2015-02-06 MED ORDER — POTASSIUM CHLORIDE IN NACL 20-0.9 MEQ/L-% IV SOLN
INTRAVENOUS | Status: DC
  Administered 2015-02-06 – 2015-02-10 (×4): via INTRAVENOUS
  Filled 2015-02-06 (×5): qty 1000

## 2015-02-06 MED ORDER — GUAIFENESIN 100 MG/5ML PO SOLN: 10.0000 mL | ORAL | Status: DC | PRN

## 2015-02-06 MED ORDER — PANTOPRAZOLE SODIUM 40 MG PO PACK
40.0000 mg | PACK | Freq: Every day | ORAL | Status: DC
  Administered 2015-02-06 – 2015-02-12 (×5): 40 mg
  Filled 2015-02-06 (×8): qty 20

## 2015-02-06 NOTE — Progress Notes (Signed)
Physical Therapy Treatment Patient Details Name: Johnny Sanford MRN: [id number] DOB: 05-29-1940 Today's Date: 02/06/2015    History of Present Illness Pt here with hyponatremia, has been having some falls and confusion recently.  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    PT Comments    Pt limited to ambulating 65 feet with SPC and assist (pt holding onto IV pole with other hand) d/t O2 noted to be 80% on 3 L/min via nasal cannula with ambulation (pt on 2.5 L supplemental O2 in room with O2 sats 96% at rest).  No excessive SOB noted but pt assisted into sitting on chair and pushed back to room in chair.  Pt appears very motivated to walk but currently appears he would benefit from RW trial next session (if pt is agreeable).  Will continue to progress pt with ambulation, balance, strengthening per pt tolerance.   Follow Up Recommendations  Home health PT     Equipment Recommendations   (pt may benefit from RW but will continue to assess pt's progress)    Recommendations for Other Services       Precautions / Restrictions Precautions Precautions: Fall Precaution Comments: NG tube; monitor O2 sats Restrictions Weight Bearing Restrictions: No    Mobility  Bed Mobility Overal bed mobility: Modified Independent             General bed mobility comments: Supine to/from sit with slight increased time  Transfers Overall transfer level: Needs assistance Equipment used: Straight cane Transfers: Sit to/from Stand Sit to Stand: Min guard;Min assist         General transfer comment: posterior lean upon standing but strong stand noted  Ambulation/Gait Ambulation/Gait assistance: Min assist;+2 safety/equipment Ambulation Distance (Feet): 65 Feet Assistive device: Straight cane   Gait velocity: decreased   General Gait Details: posterior lean initially but improved with ambulation distance; decreased B LE step length/foot  clearance/heelstrike; pt holding SPC in R hand and holding onto IV pole in L hand; no excessive SOB noted but distance limited d/t O2 sats noted to be 80% on 3 L/min O2 via nasal cannula   Stairs            Wheelchair Mobility    Modified Rankin (Stroke Patients Only)       Balance Overall balance assessment: Needs assistance Sitting-balance support: Bilateral upper extremity supported;Feet supported Sitting balance-Leahy Scale: Good     Standing balance support: Single extremity supported (with SPC) Standing balance-Leahy Scale: Fair                      Cognition Arousal/Alertness: Awake/alert Behavior During Therapy: WFL for tasks assessed/performed Overall Cognitive Status: Within Functional Limits for tasks assessed                      Exercises      General Comments General comments (skin integrity, edema, etc.): NG feeding tube in place  Nursing cleared pt for participation in physical therapy.  Pt agreeable to PT session.  Pt's wife present during session.      Pertinent Vitals/Pain Pain Assessment:  (general chronic pain)    Home Living                      Prior Function            PT Goals (current goals can now be found in the care plan section) Acute Rehab PT Goals Patient  Stated Goal: go home PT Goal Formulation: With patient Time For Goal Achievement: 02/17/15 Potential to Achieve Goals: Fair Progress towards PT goals: Progressing toward goals    Frequency  Min 2X/week    PT Plan Current plan remains appropriate    Co-evaluation             End of Session Equipment Utilized During Treatment: Gait belt Activity Tolerance:  (Limited d/t O2 desaturation with distance) Patient left: in bed;with call bell/phone within reach;with bed alarm set;with family/visitor present     Time: 1115-1145 PT Time Calculation (min) (ACUTE ONLY): 30 min  Charges:  $Gait Training: 8-22 mins $Therapeutic Activity: 8-22  mins                    G CodesLeitha Bleak 02-07-2015, 12:05 PM Leitha Bleak, [place]

## 2015-02-06 NOTE — Progress Notes (Signed)
Order to advance Dobbhoff 5in clarified with Dr Ether Griffins, tube advanced 5 in

## 2015-02-06 NOTE — Progress Notes (Signed)
Nutrition Follow-up   INTERVENTION:   EN: Continue current TF regimen as ordered, will continue to monitor electrolytes, UOP, and tolerance and make recommendations accordingly.   NUTRITION DIAGNOSIS:   Inadequate oral intake related to dysphagia as evidenced by NPO status. Being addressed with EN via dobhoff  GOAL:   Patient will meet greater than or equal to 90% of their needs; ongoing  MONITOR:    (Energy intake, Electrolyte and renal profile, Digestive system)  REASON FOR ASSESSMENT:   Consult Enteral/tube feeding initiation and management  ASSESSMENT:    Pt with tube feeding via dobhoff tube initiated yesterday. Pt working with PT this am and speaking with MD on second visit.  Diet Order:  Diet NPO time specified    Current Nutrition: EN: Jevity 1.5 just advanced to goal rate this am of 28mL/hr with free water of 9mL q4hours via dobhoff per RN Estill Bamberg and tolerating well.   Gastrointestinal Profile: Last BM: 02/06/2015  UOP: 432mL out this am per documentation   Scheduled Medications:  . aspirin  162 mg Oral Daily  . docusate sodium  100 mg Oral BID  . enoxaparin (LOVENOX) injection  40 mg Subcutaneous QHS  . escitalopram  20 mg Oral Daily  . fludrocortisone  0.1 mg Oral Daily  . free water  30 mL Per Tube Q4H  . hydroxyurea  500 mg Oral TID WC  . ipratropium-albuterol  3 mL Nebulization Q4H  . levothyroxine  137 mcg Oral QAC breakfast  . mometasone-formoterol  2 puff Inhalation BID  . omega-3 acid ethyl esters  1 g Oral Daily  . pantoprazole sodium  40 mg Per Tube QAC breakfast  . sodium chloride  3 mL Intravenous Q12H  . vitamin B-12  1,000 mcg Oral Daily    Continuous Medications:  . feeding supplement (JEVITY 1.5 CAL/FIBER) 1,000 mL (02/06/15 0511)     Electrolyte/Renal Profile and Glucose Profile:   Recent Labs Lab 02/04/15 0426 02/05/15 0502 02/06/15 0617  NA 123* 121* 127*  K 3.6 3.7 3.5  CL 85* 85* 88*  CO2 29 29 33*  BUN 14 16 19    CREATININE 0.50* 0.53* 0.61  CALCIUM 8.9 8.4* 8.8*  GLUCOSE 120* 98 112*   Protein Profile:  Recent Labs Lab 02/02/15 1154 02/04/15 0426  ALBUMIN 3.9 3.5     Weight Trend since Admission: Filed Weights   02/02/15 1639 02/05/15 1819  Weight: 148 lb (67.132 kg) 137 lb (62.143 kg)    BMI:  Body mass index is 23.5 kg/(m^2).  Estimated Nutritional Needs:   Kcal:  BEE 1326 kcals (IF 1.0-1.3, AF 1.3) [id number] kcals/d  Protein:  (1.0-1.2 g/kg) 67-80 g/d   Fluid:  (25-5ml/kg) 1675-2071ml/d  EDUCATION NEEDS:   No education needs identified at this time   [place], RD, LDN Pager [phone] Weekend/On-Call Pager [phone]

## 2015-02-06 NOTE — Progress Notes (Addendum)
[facility] [place] NAME: Johnny Sanford    MR#:  [id number]  DATE OF BIRTH:  09-Jan-1941  SUBJECTIVE:  CHIEF COMPLAINT:   Chief Complaint  Patient presents with  . low sodium     low sodium   The patient is 75 year old male with past medical history significant for the. History of SIADH, who presents to the hospital with complaints of low sodium levels, 123. Apparently patient has been drinking plenty of fluids, although was recommended to be on fluid restriction. In the hospital patient was noted to be orthostatically hypotensive and IV fluids were administered. His sodium level has improved from 123-126  initially but then dropped down to 119. Patient's blood pressure drops down to 70s on standing, rehydrated, on fludrocortisone for adrenal insufficiency. Patient was hypoxic while standing yesterday. The angiogram of the chest revealed nodular consolidations in the lungs , likely inflammatory, but no pulmonary embolism He denies any discomfort, patient wants to go home. Physical therapist and recommended home health physical therapy.  Sodium has improved to 123 with fluid restriction and sodium tablets. Aspirated day before yesterday and the [facility] was called for respiratory arrest. Patient's airways were suctioned oxygen applied and he became responsive. Modified barium barium swallowing study was ordered, however, not performed due to broken equipment. Barium swallowing study then was ordered, however, patient aspirated barium. NG tube was placed and tube feeds were initiated. Patient wants to eat and drink, requests ice chips, explained that the speech therapist evaluation has to be done before ice chips are ordered     Review of Systems  Unable to perform ROS: acuity of condition    VITAL SIGNS: Blood pressure 143/114, pulse 70, temperature 97.9 F (36.6 C), temperature source Oral, resp. rate 20, height 5\' 4"  (1.626 m), weight 62.143 kg  (137 lb), SpO2 100 %.  PHYSICAL EXAMINATION:   GENERAL:  75 y.o.-year-old patient lying in the bed moderate to mild respiratory  Distress.tachypneic, coughing. Hoarseness was noted EYES: Pupils equal, round, reactive to light and accommodation. No scleral icterus. Extraocular muscles intact.  HEENT: Head atraumatic, normocephalic. Oropharynx and nasopharynx clear.  NECK:  Supple, no jugular venous distention. No thyroid enlargement, no tenderness.  LUNGS: diminished breath sounds bilaterally, no wheezing,  left sided rales,, few right sided rhonchi, no crepitation.intermittently using accessory muscles of respiration, dyspneic, coughing.  CARDIOVASCULAR: S1, S2 normal. No murmurs, rubs, or gallops.  ABDOMEN: Soft, nontender, nondistended. Bowel sounds present. No organomegaly or mass.  EXTREMITIES: No pedal edema, cyanosis, or clubbing.  NEUROLOGIC: Cranial nerves II through XII are intact. Muscle strength 5/5 in all extremities. Sensation intact. Gait not checked.  PSYCHIATRIC: The patient is some somnolent, not able to assess orientation.  SKIN: No obvious rash, lesion, or ulcer.   ORDERS/RESULTS REVIEWED:   CBC  Recent Labs Lab 02/02/15 1646 02/03/15 0557 02/04/15 0426 02/06/15 0617  WBC 2.8* 2.4* 4.1 3.2*  HGB 9.6* 10.2* 10.3* 9.7*  HCT 28.2* 28.5* 29.3* 27.9*  PLT 424 392 408 381  MCV 128.9* 128.2* 127.7* 127.8*  MCH 44.2* 45.6* 45.0* 44.4*  MCHC 34.3 35.6 35.2 34.7  RDW 14.7* 14.7* 14.7* 14.8*  LYMPHSABS 0.9*  --   --   --   MONOABS 0.4  --   --   --   EOSABS 0.0  --   --   --   BASOSABS 0.0  --   --   --    ------------------------------------------------------------------------------------------------------------------  Chemistries  Recent Labs Lab 02/02/15 1154 02/02/15 1646 02/03/15 0557 02/03/15 1433 02/03/15 1803 02/04/15 0426 02/05/15 0502 02/06/15 0617  NA 123* 126* 125* 120* 119* 123* 121* 127*  K 4.7 4.0 3.9  --   --  3.6 3.7 3.5  CL 84* 87* 86*   --   --  85* 85* 88*  CO2 34* 33* 31  --   --  29 29 33*  GLUCOSE 94 118* 94  --   --  120* 98 112*  BUN 17 18 14   --   --  14 16 19   CREATININE 0.66 0.58* 0.47*  --   --  0.50* 0.53* 0.61  CALCIUM 8.9 8.8* 8.8*  --   --  8.9 8.4* 8.8*  AST 16  --   --   --   --  23  --   --   ALT 9  --   --   --   --  13*  --   --   ALKPHOS 57  --   --   --   --  45  --   --   BILITOT 0.5  --   --   --   --  0.8  --   --    ------------------------------------------------------------------------------------------------------------------ estimated creatinine clearance is 68.9 mL/min (by C-G formula based on Cr of 0.61). ------------------------------------------------------------------------------------------------------------------ No results for input(s): TSH, T4TOTAL, T3FREE, THYROIDAB in the last 72 hours.  Invalid input(s): FREET3  Cardiac Enzymes No results for input(s): CKMB, TROPONINI, MYOGLOBIN in the last 168 hours.  Invalid input(s): CK ------------------------------------------------------------------------------------------------------------------ Invalid input(s): POCBNP ---------------------------------------------------------------------------------------------------------------  RADIOLOGY: Dg Abd 1 View  02/06/2015  CLINICAL DATA:  Assess NG tube placement ; impaired NG tube feeding EXAM: ABDOMEN - 1 VIEW COMPARISON:  Chest and abdominal film of February 05, 2015 FINDINGS: The radiodense tip of the feeding to lies in the region of the gastric body laterally. The stomach does not appear distended with gas. There is contrast in the ascending, transverse, and proximal descending colon with no significant small bowel contrast demonstrated. IMPRESSION: The feeding tube tip lies in the proximal aspect of the gastric body. Advancement of the tube for the distal stomach would be useful. This may occur spontaneously with additional tube slack being provided and turning the patient onto his right  side. Electronically Signed   By: David  Martinique M.D.   On: 02/06/2015 08:00   Dg Abd 1 View  02/05/2015  CLINICAL DATA:  Encounter for NG tube placement EXAM: ABDOMEN - 1 VIEW COMPARISON:  None. FINDINGS: NG tube is in place with the tip in the mid stomach. Nonobstructive bowel gas pattern. IMPRESSION: NG tube tip in the mid stomach. Electronically Signed   By: Rolm Baptise M.D.   On: 02/05/2015 15:38   Dg Esophagus  02/05/2015  CLINICAL DATA:  Six-month history of dysphagia especially to solids, cough, weight loss. EXAM: ESOPHOGRAM/BARIUM SWALLOW TECHNIQUE: Single contrast examination was performed using thin barium. A barium tablet was administered. FLUOROSCOPY TIME:  Fluoroscopy Time:  1 minutes, 12 seconds. Number of Acquired Images:  10 COMPARISON:  Chest x-ray of February 05, 2015 FINDINGS: Upon the initial ingestion of thick barium there was prompt laryngeal penetration with extension of barium into the trachea and the bronchi. This elicited a mild cough. Some barium did enter the esophagus allowing visualization of the structure. The cervical esophagus appeared to distend well. No significant mass effect upon the cervical esophagus was observed. The thoracic esophagus distended well. Under fluoroscopic  observation the lower cervical esophagus and appeared to distend reasonably well. No mucosal irregularity was observed. An attempt was made to have the patient ingest a barium tablet. However, he was not able to propel the tablet into the posterior oropharynx with the tongue and had to spit out the tablet. IMPRESSION: 1. This is a quite limited study due to a large amount of aspiration of the barium preparation. A double-contrast study had been planned but only a very limited single contrast study could be performed following the large amount of aspiration. The epiglottic function appears disordered and the motions of the tongue propelling the tablet as well as the liquid barium into the posterior oropharynx  is disordered. 2. No mass-effect upon the proximal esophagus by the cervical spine is observed. 3. The thoracic esophagus is grossly normal for age with only mild changes of presbyesophagus. Electronically Signed   By: David  Martinique M.D.   On: 02/05/2015 10:47   Dg Chest Port 1 View  02/05/2015  CLINICAL DATA:  75 year old male with aspiration. Hypoxia. Initial encounter. EXAM: PORTABLE CHEST 1 VIEW COMPARISON:  Chest CTA 02/03/2015 and earlier. FINDINGS: Portable AP upright view at 0610 hours. Stable lung volumes since March 2016. Calcified aortic atherosclerosis. Stable cardiac size and mediastinal contours. Visualized tracheal air column is within normal limits. No pneumothorax, pulmonary edema or pleural effusion. Chronic streaky left infrahilar opacity. Radiographically, no acute pulmonary opacity. IMPRESSION: Stable radiographic appearance of the chest since 2016, no acute aspiration suspected. See also report of the recent chest CTA 02/03/2015. Electronically Signed   By: Genevie Ann M.D.   On: 02/05/2015 07:53    EKG:  Orders placed or performed in visit on 03/17/14  . EKG 12-Lead    ASSESSMENT AND PLAN:  Active Problems:   Hyponatremia 1 acute respiratory failure with hypoxia, aspiration related, unremarkable post aspiration chest xray, , now on O2 per nasal cannulas ,  patient is placed nothing by mouth and NG tube feeds  are initiated after tube is placed , initiate antibiotics if febrile, , following clinically   2 dysphagia,  status post recurrent aspirations , now patient is NPO, SLP is consulted , NG tube was placed on January 9, tube feeds were initiated by dietary, brain  and cervical spine MRI done at [facility] revealed multilevel spondylosis, mostly pronounced at C2-C3 vertebral level , severe spinal canal stenosis gets palliative care involved for further recommendations since patient will likely be progressively aspirating ,  leading to decline  3. Hyponatremia, due to  SIADH,  on fluid  restriction, sodium level has improved to 127. Check tomorrow morning, 4. Orthostatic hypotension,  due to adrenal insufficiency,  continue high dose of fludrocortisone.  5. Leukopenia, chronic, stable  6.  anemia, follow , no bleeding noted  7. Generalized weakness with falls, appreciate physical therapist input, patient will be discharged likely home with home health physical therapy, although I would recommend lifepath 8. Hypoxia while standing, echocardiogram showed no shunt or hypertrophic cardiomyopathy , ABGs revealed mild hypoxemia, CT angiogram of the chest showed no PE but nodular consolidations in the lungs, questionable atypical pneumonia, , likely chronic aspirations. CT of chest in 3-6 months was recommended. Patient is afebrile, has no elevated white blood cell count, but now he has been coughing intermittently, watch for possible aspiration pneumonitis .  BNP is minimally elevated at 232   Management plans discussed with the patient, family and they are in agreement.   DRUG ALLERGIES:  Allergies  Allergen Reactions  .  Simvastatin Anxiety    CODE STATUS:     Code Status Orders        Start     Ordered   02/02/15 2132  Full code   Continuous     02/02/15 2131      TOTAL TIME TAKING CARE OF THIS PATIENT . 60 minutes.   About 15 minutes discussion with patient's wife about patient's condition, treatment plan and rehabilitation, QUESTIONS were answered, voiced understanding  Lynnell Fiumara M.D on 02/06/2015 at 1:17 PM  Between 7am to 6pm - Pager - [phone]  After 6pm go to [email or web address] - password [email or web address] [facility]  Office  [phone]  CC: Primary care physician; Rica Mast, MD

## 2015-02-07 ENCOUNTER — Inpatient Hospital Stay: Payer: Medicare Other

## 2015-02-07 LAB — SODIUM
SODIUM: 130 mmol/L — AB (ref 135–145)
SODIUM: 128 mmol/L — AB (ref 135–145)

## 2015-02-07 MED ORDER — LEVOFLOXACIN IN D5W 750 MG/150ML IV SOLN
750.0000 mg | INTRAVENOUS | Status: DC
  Administered 2015-02-07 – 2015-02-11 (×5): 750 mg via INTRAVENOUS
  Filled 2015-02-07 (×5): qty 150

## 2015-02-07 MED ORDER — IOHEXOL 300 MG/ML  SOLN
75.0000 mL | Freq: Once | INTRAMUSCULAR | Status: AC | PRN
  Administered 2015-02-07: 75 mL via INTRAVENOUS

## 2015-02-07 MED ORDER — CETYLPYRIDINIUM CHLORIDE 0.05 % MT LIQD
7.0000 mL | Freq: Two times a day (BID) | OROMUCOSAL | Status: DC
  Administered 2015-02-07 – 2015-02-09 (×5): 7 mL via OROMUCOSAL

## 2015-02-07 NOTE — Progress Notes (Signed)
Palliative Care Update  CT shows some possible focal inflammatory change in RUL, but no gross abnormality of neck that would affect his esophageal function.  I talked some with pt this evening.  I could not understand every word.  He wants to eat by mouth --but he also wants the feeding tube placed.  But, I wasn't sure that was what he was telling me.    Will need to talk with his wife tomorrow before any procedures are initiated.    Full note for this visit to follow.   Colleen Can, MD

## 2015-02-07 NOTE — Plan of Care (Signed)
Problem: Safety: Goal: Ability to remain free from injury will improve Outcome: Progressing Pt high fall risk. Room near nurses station. Bed alarm in place. Pt oriented to call system.  Problem: Pain Managment: Goal: General experience of comfort will improve Outcome: Progressing Pt denies pain. Rested well.   Problem: Physical Regulation: Goal: Ability to maintain clinical measurements within normal limits will improve Outcome: Progressing Vital signs stable  Problem: Fluid Volume: Goal: Ability to maintain a balanced intake and output will improve Outcome: Progressing Voiding in the urinal. Jevity at 50 ml/hr via Dobbhoff. Tolerating tf well.

## 2015-02-07 NOTE — Progress Notes (Signed)
Flutter valve performed. Moist, non-prod cough

## 2015-02-07 NOTE — Progress Notes (Signed)
Palliative Care Progress Note- Met with Belenda Cruise from Speech and patient's wife to discuss patient's modified barium swallow results. Patient did not pass test, and according to [facility], has significant issues related to aspiration risks and "blockages". There is a pending CT of the neck entered and recommendation will be made for GI involvement. Wife accurately understands that further testing is needed before any decisions are made about "next steps" and potential treatment options. Reviewed code status with wife and with patient. Wife reports that patient has a living will and would not want "heroic measures". Patient is adamant about not having the NG tube replaced, but has not thought further about Gtube placement. Patient confirms that he does not want resuscitation and desires DNR. Wife agrees with plan for CT, GI consultation and then meeting with Palliative Care to further discuss options. Wife and patient would benefit from meeting with Dr. Megan Salon once scans complete to discuss current status/goals. Care team including Dr. Ether Griffins updated with this information.   Atha Starks, MSW, LCSW Palliative Care Social Worker [phone]

## 2015-02-07 NOTE — Progress Notes (Signed)
Nutrition Follow-up   INTERVENTION:   Coordination of Care: RD notes that dobhoff tube has been removed; pt s/p MBSS this am and did not pass per SLP; further testing to be performed. Will follow poc regarding means of nutrition.   NUTRITION DIAGNOSIS:   Inadequate oral intake related to dysphagia as evidenced by NPO status.  GOAL:   Patient will meet greater than or equal to 90% of their needs  MONITOR:    (Energy intake, Electrolyte and renal profile, Digestive system)  REASON FOR ASSESSMENT:   Consult Enteral/tube feeding initiation and management  ASSESSMENT:    Pt s/p MBSS this am, dobhoff removed.  Diet Order:  Diet NPO time specified    EN: Pt was tolerating Jevity 1.5 at 75mL/hr with free water of 67mL q4 hours. Pt currently NPO not on nutrition support.   Gastrointestinal Profile: Last BM: 02/06/2015   Scheduled Medications:  . antiseptic oral rinse  7 mL Mouth Rinse BID  . aspirin  162 mg Oral Daily  . docusate sodium  100 mg Oral BID  . enoxaparin (LOVENOX) injection  40 mg Subcutaneous QHS  . escitalopram  20 mg Oral Daily  . fludrocortisone  0.1 mg Oral Daily  . hydroxyurea  500 mg Oral TID WC  . ipratropium-albuterol  3 mL Nebulization Q4H  . levofloxacin (LEVAQUIN) IV  750 mg Intravenous Q24H  . levothyroxine  137 mcg Oral QAC breakfast  . mometasone-formoterol  2 puff Inhalation BID  . omega-3 acid ethyl esters  1 g Oral Daily  . pantoprazole sodium  40 mg Per Tube QAC breakfast  . sodium chloride  3 mL Intravenous Q12H  . vitamin B-12  1,000 mcg Oral Daily    Continuous Medications:  . 0.9 % NaCl with KCl 20 mEq / L 40 mL/hr at 02/06/15 1736     Electrolyte/Renal Profile and Glucose Profile:   Recent Labs Lab 02/04/15 0426 02/05/15 0502 02/06/15 0617 02/07/15 0536  NA 123* 121* 127* 130*  K 3.6 3.7 3.5  --   CL 85* 85* 88*  --   CO2 29 29 33*  --   BUN 14 16 19   --   CREATININE 0.50* 0.53* 0.61  --   CALCIUM 8.9 8.4* 8.8*  --    GLUCOSE 120* 98 112*  --    Protein Profile:  Recent Labs Lab 02/02/15 1154 02/04/15 0426  ALBUMIN 3.9 3.5    Weight Trend since Admission: Filed Weights   02/02/15 1639 02/05/15 1819 02/07/15 0500  Weight: 148 lb (67.132 kg) 137 lb (62.143 kg) 140 lb 12.8 oz (63.866 kg)     BMI:  Body mass index is 24.16 kg/(m^2).  Estimated Nutritional Needs:   Kcal:  BEE 1326 kcals (IF 1.0-1.3, AF 1.3) [id number] kcals/d  Protein:  (1.0-1.2 g/kg) 67-80 g/d   Fluid:  (25-70ml/kg) 1675-2060ml/d  EDUCATION NEEDS:   No education needs identified at this time   [place], RD, LDN Pager [phone] Weekend/On-Call Pager [phone]

## 2015-02-07 NOTE — Consult Note (Signed)
GI Inpatient Consult Note  Reason for Consult: Dysphagia    Attending Requesting Consult: Dr. Ether Griffins   History of Present Illness: Johnny Sanford is a 75 y.o. male seen for evaluation of dysphagia at the request of Dr. Ether Griffins. Wife helps with history.   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 He is avoiding trigger foods such as hamburgers which cause dysphagia and regurgitation. Chronic frequent regurgitation of solids. He denies cough w/ liquids intake. No odynophagia. Denies GERD symptoms, epigastric pain, N/V. He has had a 60lb weight loss over the past few years due to poor appetite and dysphagia.   He has no lower GI complaints-denies constipation, diarrhea, rectal bleeding, abdominal pain.   Per chart review had code blue after episode of aspiration. Became responsive after suction. He had an NGT after episode until this morning when had MBSS.     Last Colonoscopy: around 2009-report not available Last Endoscopy: None Prior   Past Medical History:  Past Medical History  Diagnosis Date  . HLD (hyperlipidemia)   . HTN (hypertension)   . GERD (gastroesophageal reflux disease)   . Hypothyroidism   . Cancer ([place]) 1991    sqauamous cell ca  . CVA (cerebral vascular accident) ([place])   . DA (degenerative arthritis)   . RA (rheumatoid arthritis) ([place])   . Adrenal insufficiency ([place])   . Cervical spinal stenosis   . Falls   . Collagen vascular disease ([place])     Problem List: Patient Active Problem List   Diagnosis Date Noted  . Dysphagia 02/02/2015  . Spinal stenosis in cervical region 01/08/2015  . Recurrent falls 06/29/2014  . Allergic rhinitis 05/04/2014  . 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 CAD (coronary artery disease) 04/13/2014  . Multiple lung nodules on CT 04/13/2014  . Atherosclerosis of aorta ([place]) 04/13/2014  . Cough 03/31/2014  . Medicare annual wellness visit, subsequent 12/09/2013   . Anxiety state 05/10/2013  . Chronic neck pain 11/24/2012  . Abnormal CT scan, chest 06/03/2012  . Labile hypertension 05/20/2012  . Memory loss 03/31/2012  . Adrenal insufficiency ([place]) 01/20/2012  . Hyponatremia 12/24/2011  . Fatigue 10/28/2011  . Essential thrombocythemia ([place]) 06/13/2011  . Low back pain 05/19/2011  . Cardiomegaly 04/02/2011  . COPD (chronic obstructive pulmonary disease) ([place]) 04/02/2011  . Rheumatoid arthritis(714.0) 03/11/2011  . Insomnia 02/17/2011  . Arthralgia 02/17/2011  . PAD (peripheral artery disease) ([place]) 08/30/2009  . Carotid artery stenosis 06/10/2009  . Hyperlipidemia 06/08/2009    Past Surgical History: Past Surgical History  Procedure Laterality Date  . Carotid endarterectomy      left  . Skin lesion excision      Dr. Sarajane Jews, and Dr. Vickki Muff at [facility]    Allergies: Allergies  Allergen Reactions  . Simvastatin Anxiety    Home Medications: Prescriptions prior to admission  Medication Sig Dispense Refill Last Dose  . aspirin 81 MG EC tablet Take 162 mg by mouth daily.    unknown at unknown  . clonazePAM (KLONOPIN) 0.5 MG tablet Take 1 tablet (0.5 mg total) by mouth 3 (three) times daily as needed for anxiety. 90 tablet 3 PRN at PRN  . escitalopram (LEXAPRO) 20 MG tablet Take 20 mg by mouth daily.   unknown at unknown  . fludrocortisone (FLORINEF) 0.1 MG tablet Take 1 tablet (0.1 mg total) by mouth daily. 30 tablet 5 unknown at unknown  . fluorouracil (EFUDEX) 5 % cream Apply  1 application topically as needed.   PRN at PRN  . fluticasone (FLONASE) 50 MCG/ACT nasal spray Place 1 spray into both nostrils daily as needed for rhinitis. 16 g 6 PRN at PRN  . Fluticasone Furoate-Vilanterol (BREO ELLIPTA) 200-25 MCG/INH AEPB Inhale 200 mcg into the lungs daily. 1 each 6 unknown at unknown  . hydroxyurea (HYDREA) 500 MG capsule Take 500 mg by mouth 3 (three) times daily with meals.    unknown at unknown  . levothyroxine (SYNTHROID, LEVOTHROID) 137  MCG tablet Take 1 tablet (137 mcg total) by mouth daily. 90 tablet 3 unknown at unknown  . meclizine (ANTIVERT) 25 MG tablet Take 25 mg by mouth 2 (two) times daily as needed for dizziness.    PRN at PRN  . mometasone (ELOCON) 0.1 % lotion Apply 1 application topically as needed (for irritation).    PRN at PRN  . nystatin (MYCOSTATIN) 100000 UNIT/ML suspension Take 5 mLs by mouth as needed (for thrush).    PRN at PRN  . Omega-3 Fatty Acids (FISH OIL) 1000 MG CAPS Take 1,000 mg by mouth daily.   unknown at unknown  . omeprazole (PRILOSEC) 20 MG capsule Take 1 capsule (20 mg total) by mouth daily. 90 capsule 3 unknown at unknown  . traZODone (DESYREL) 50 MG tablet Take 0.5-1 tablets (25-50 mg total) by mouth at bedtime as needed for sleep. 30 tablet 2 PRN at PRN  . vitamin B-12 (CYANOCOBALAMIN) 1000 MCG tablet Take 1,000 mcg by mouth daily.   unknown at unknown   Home medication reconciliation was completed with the patient.   Scheduled Inpatient Medications:   . antiseptic oral rinse  7 mL Mouth Rinse BID  . aspirin  162 mg Oral Daily  . docusate sodium  100 mg Oral BID  . enoxaparin (LOVENOX) injection  40 mg Subcutaneous QHS  . escitalopram  20 mg Oral Daily  . fludrocortisone  0.1 mg Oral Daily  . hydroxyurea  500 mg Oral TID WC  . ipratropium-albuterol  3 mL Nebulization Q4H  . levofloxacin (LEVAQUIN) IV  750 mg Intravenous Q24H  . levothyroxine  137 mcg Oral QAC breakfast  . mometasone-formoterol  2 puff Inhalation BID  . omega-3 acid ethyl esters  1 g Oral Daily  . pantoprazole sodium  40 mg Per Tube QAC breakfast  . sodium chloride  3 mL Intravenous Q12H  . vitamin B-12  1,000 mcg Oral Daily    Continuous Inpatient Infusions:   . 0.9 % NaCl with KCl 20 mEq / L 40 mL/hr at 02/06/15 1736    PRN Inpatient Medications:   Family History: family history includes Heart disease in his father. .  Social History:   reports that he quit smoking about 13 years ago. His smoking use  included Cigarettes. He quit after 15 years of use. He has never used smokeless tobacco. He reports that he does not drink alcohol or use illicit drugs.    Review of Systems: Constitutional: + weight loss.   Eyes: No changes in vision. ENT: No oral lesions, sore throat.  GI: see HPI.  Heme/Lymph: No easy bruising.  CV: No chest pain.  GU: No hematuria.  Integumentary: No rashes.  Neuro: No headaches.  Psych: No depression/anxiety.  Endocrine: No heat/cold intolerance.  Allergic/Immunologic: No urticaria.  Resp: + SOB.  Musculoskeletal: + weakness.    Physical Examination: BP 132/71 mmHg  Pulse 72  Temp(Src) 97.5 F (36.4 C) (Oral)  Resp 19  Ht 5\' 4"  (1.626  m)  Wt 140 lb 12.8 oz (63.866 kg)  BMI 24.16 kg/m2  SpO2 96%   Gen: chronically ill appearing WM in NAD, alert and oriented x 4 HEENT: PEERLA, EOMI, Neck: supple, no JVD or thyromegaly Chest: bilateral wheezing in lower lobes CV: RRR, no m/g/c/r Abd: soft, NT, ND, +BS in all four quadrants; no HSM, guarding, ridigity, or rebound tenderness. + prior surgical scar abd midline (from SBO surgery 2005).  Ext: no edema, well perfused with 2+ pulses, Skin: no rash or lesions noted Lymph: no LAD  Data: Lab Results  Component Value Date   WBC 3.2* 02/06/2015   HGB 9.7* 02/06/2015   HCT 27.9* 02/06/2015   MCV 127.8* 02/06/2015   PLT 381 02/06/2015    Recent Labs Lab 02/03/15 0557 02/04/15 0426 02/06/15 0617  HGB 10.2* 10.3* 9.7*   Lab Results  Component Value Date   NA 128* 02/07/2015   K 3.5 02/06/2015   CL 88* 02/06/2015   CO2 33* 02/06/2015   BUN 19 02/06/2015   CREATININE 0.61 02/06/2015   GLU 93 05/22/2014   Lab Results  Component Value Date   ALT 13* 02/04/2015   AST 23 02/04/2015   ALKPHOS 45 02/04/2015   BILITOT 0.8 02/04/2015   No results for input(s): APTT, INR, PTT in the last 168 hours.   CT Soft Tissue Neck w/ Contrast:  No acute abnormality. Extensive atherosclerotic vascular  disease. Small focus of ground-glass attenuation the periphery of the right upper lobe is new since the comparison chest CT and may represent focal inflammatory change. Extensive atherosclerosis.  Esophagram/Barium Swallow: 1. This is a quite limited study due to a large amount of aspiration of the barium preparation. A double-contrast study had been planned but only a very limited single contrast study could be performed following the large amount of aspiration. The epiglottic function appears disordered and the motions of the tongue propelling the tablet as well as the liquid barium into the posterior oropharynx is disordered. 2. No mass-effect upon the proximal esophagus by the cervical spine is observed. 3. The thoracic esophagus is grossly normal for age with only mild changes of presbyesophagus.   Assessment/Plan: Mr. Leiby is a 75 y.o. male seen for abnormal barium swallow following an episode of aspiration leading to respiratory distress. Needs EGD w/ PEG placement if family decides to be aggressive with care. Clearly at high risk for aspiration based on swallow study. Also higher risk for sedation-per PT low 80s and upper 70s O2 sat while ambulating in hallway. Discussed recommendations for PEG tube with pt and wife. They will think about tonight and discuss further with Dr. Candace Cruise tomorrow. Tentatively plan for PEG Friday-hold Thursday PM lovenox and Friday AM aspirin. Needs Ancef prior to PEG.      Recommendations:  Tentatively plan for PEG Friday pending discussion with Dr. Candace Cruise tomorrow.   *Case discussed w/ Dr. Candace Cruise.    Wife asked for chart update that she can be reached at [phone] if she is not in room during visits.    Thank you for the consult. Please call with questions or concerns.  Ronney Asters, PA-C [place]

## 2015-02-07 NOTE — Progress Notes (Signed)
Physical Therapy Treatment Patient Details Name: Johnny Sanford MRN: [id number] DOB: 08/06/1940 Today's Date: 02/07/2015    History of Present Illness Pt here with hyponatremia, has been hasving some falls and confusion recently.  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    PT Comments    Pt is progressing well towards goals. He is highly motivated to walk and exercise, but is still limited by his O2 saturation (see gait comments). Pt was much improved balance/safety with ambulation using the RW, but needed min cues for safety and for forward lean. PT recommends RW for home use. Pt and wife are agreeable to this as well. Pt also able to tolerate LE strengthening today with O2 staying above 88%. Pt would benefit from further PT services to address gait, balance, and strength to maximize mobility.   Follow Up Recommendations  Home health PT     Equipment Recommendations  Rolling walker with 5" wheels    Recommendations for Other Services       Precautions / Restrictions Precautions Precautions: Fall Precaution Comments: ; monitor O2 sats Restrictions Weight Bearing Restrictions: No    Mobility  Bed Mobility Overal bed mobility: Modified Independent             General bed mobility comments: Supine to/from sit with slight increased time  Transfers Overall transfer level: Needs assistance Equipment used: Rolling walker (2 wheeled)   Sit to Stand: Min guard;Min assist         General transfer comment: posterior lean upon standing but strong stand noted  Ambulation/Gait Ambulation/Gait assistance: Min guard;+2 safety/equipment Ambulation Distance (Feet): 180 Feet Assistive device: Straight cane;Rolling walker (2 wheeled) Gait Pattern/deviations: Leaning posteriorly;Decreased step length - left;Decreased step length - right Gait velocity: decreased Gait velocity interpretation: <1.8 ft/sec, indicative of risk for recurrent  falls General Gait Details: posterior lean, but able to correct with cues. improved safety and balance with use of RW vs the cane. pt had O2 desaturation to 78% after ~21ft of ambulation requiring a seated rest break. pt O2 recovered to 96% after ~60s rest in sitting. +2 needed for equipment/following with recliner. pt was then able to ambulate remaining distance with O2 82-85% until final ~21ft where he desaturated to 78% and was seated back in the bed. pts O2 >88% during therex which involved brief seated and standing exercise.    Stairs            Wheelchair Mobility    Modified Rankin (Stroke Patients Only)       Balance Overall balance assessment: Modified Independent   Sitting balance-Leahy Scale: Good       Standing balance-Leahy Scale: Fair                      Cognition Arousal/Alertness: Awake/alert Behavior During Therapy: WFL for tasks assessed/performed Overall Cognitive Status: Within Functional Limits for tasks assessed                      Exercises General Exercises - Lower Extremity Heel Raises: AROM;Strengthening;Both;10 reps Mini-Sqauts: AROM;Strengthening;10 reps Other Exercises Other Exercises: LAQ x 10, seated march x10,  Pt needs ~20-30s rest between sets due to SOB and to increase O2 sats.   General Comments        Pertinent Vitals/Pain Pain Assessment: No/denies pain    Home Living Family/patient expects to be discharged to:: Private residence Living Arrangements: Spouse/significant other     Home Access: Stairs  to enter Entrance Stairs-Rails: Can reach both   Home Equipment: Cane - single point      Prior Function Level of Independence: Independent with assistive device(s)      Comments: Pt reports that normally is walks with good confidence (using the cane) and is regularly out of the house running errands, etc   PT Goals (current goals can now be found in the care plan section) Acute Rehab PT Goals Patient  Stated Goal: go home PT Goal Formulation: With patient Time For Goal Achievement: 02/17/15 Potential to Achieve Goals: Fair Progress towards PT goals: Progressing toward goals    Frequency  Min 2X/week    PT Plan Current plan remains appropriate    Co-evaluation             End of Session Equipment Utilized During Treatment: Gait belt Activity Tolerance:  (Limited d/t O2 desaturation with distance) Patient left: in bed;with call bell/phone within reach;with bed alarm set;with family/visitor present (PA present)     Time: 1520-1550 PT Time Calculation (min) (ACUTE ONLY): 30 min  Charges:  $Gait Training: 8-22 mins $Therapeutic Exercise: 8-22 mins                    G Codes:     Priscillia Fouch C. Olis Viverette, PT, DPT [phone]  Karesha Trzcinski 02/07/2015, 3:58 PM

## 2015-02-07 NOTE — Plan of Care (Signed)
Problem: Physical Regulation: Goal: Ability to maintain clinical measurements within normal limits will improve Outcome: Progressing NG tube removed today. Pt failed barium swallow test and remains NPO. Possible EGD and PEG placement on Friday.

## 2015-02-07 NOTE — Care Management Important Message (Signed)
Important Message  Patient Details  Name: Johnny Sanford MRN: [id number] Date of Birth: 07/30/1940   Medicare Important Message Given:  Yes    Juliann Pulse A Kimra Kantor 02/07/2015, 11:39 AM

## 2015-02-07 NOTE — Progress Notes (Signed)
[facility] [place] NAME: Johnny Sanford    MR#:  [id number]  DATE OF BIRTH:  [birth year]  SUBJECTIVE:  CHIEF COMPLAINT:   Chief Complaint  Patient presents with  . low sodium     low sodium   The patient is 75 year old male with past medical history significant for the. History of SIADH, who presents to the hospital with complaints of low sodium levels, 123. Apparently patient has been drinking plenty of fluids, although was recommended to be on fluid restriction. In the hospital patient was noted to be orthostatically hypotensive and IV fluids were administered. His sodium level has improved from 123-126  initially but then dropped down to 119. Patient's blood pressure drops down to 70s on standing, rehydrated, on fludrocortisone for adrenal insufficiency. Patient was hypoxic while standing yesterday. The angiogram of the chest revealed nodular consolidations in the lungs , likely inflammatory, but no pulmonary embolism He denies any discomfort, patient wants to go home. Physical therapist and recommended home health physical therapy.  Sodium has improved to 123 with fluid restriction and sodium tablets. Aspirated day before yesterday and the [facility] was called for respiratory arrest. Patient's airways were suctioned oxygen applied and he became responsive. Modified barium barium swallowing study was ordered, however, not performed due to broken equipment. Barium swallowing study then was ordered, however, patient aspirated barium. NG tube was placed and tube feeds were initiated. Patient wants to eat and drink, but modified barium swallowing study done today revealed significant abnormalities. An aspiration, CT scan of the neck soft tissues was recommended by radiologist to rule out the foreign body/mass in esophagus.     Review of Systems  Unable to perform ROS: acuity of condition    VITAL SIGNS: Blood pressure 133/70, pulse 71, temperature 98  F (36.7 C), temperature source Oral, resp. rate 18, height 5\' 4"  (1.626 m), weight 63.866 kg (140 lb 12.8 oz), SpO2 96 %.  PHYSICAL EXAMINATION:   GENERAL:  75 y.o.-year-old patient lying in the bed moderate to mild respiratory  Distress.tachypneic, coughing. Hoarseness was noted EYES: Pupils equal, round, reactive to light and accommodation. No scleral icterus. Extraocular muscles intact.  HEENT: Head atraumatic, normocephalic. Oropharynx and nasopharynx clear.  NECK:  Supple, no jugular venous distention. No thyroid enlargement, no tenderness.  LUNGS: diminished breath sounds bilaterally, no wheezing,  left sided rales,, few right sided rhonchi, no crepitation.intermittently using accessory muscles of respiration, dyspneic, coughing.  CARDIOVASCULAR: S1, S2 normal. No murmurs, rubs, or gallops.  ABDOMEN: Soft, nontender, nondistended. Bowel sounds present. No organomegaly or mass.  EXTREMITIES: No pedal edema, cyanosis, or clubbing.  NEUROLOGIC: Cranial nerves II through XII are intact. Muscle strength 5/5 in all extremities. Sensation intact. Gait not checked.  PSYCHIATRIC: The patient is some somnolent, not able to assess orientation.  SKIN: No obvious rash, lesion, or ulcer.   ORDERS/RESULTS REVIEWED:   CBC  Recent Labs Lab 02/02/15 1646 02/03/15 0557 02/04/15 0426 02/06/15 0617  WBC 2.8* 2.4* 4.1 3.2*  HGB 9.6* 10.2* 10.3* 9.7*  HCT 28.2* 28.5* 29.3* 27.9*  PLT 424 392 408 381  MCV 128.9* 128.2* 127.7* 127.8*  MCH 44.2* 45.6* 45.0* 44.4*  MCHC 34.3 35.6 35.2 34.7  RDW 14.7* 14.7* 14.7* 14.8*  LYMPHSABS 0.9*  --   --   --   MONOABS 0.4  --   --   --   EOSABS 0.0  --   --   --   BASOSABS 0.0  --   --   --    ------------------------------------------------------------------------------------------------------------------  Chemistries   Recent Labs Lab 02/02/15 1154 02/02/15 1646 02/03/15 0557  02/03/15 1803 02/04/15 0426 02/05/15 0502 02/06/15 0617  02/07/15 0536  NA 123* 126* 125*  < > 119* 123* 121* 127* 130*  K 4.7 4.0 3.9  --   --  3.6 3.7 3.5  --   CL 84* 87* 86*  --   --  85* 85* 88*  --   CO2 34* 33* 31  --   --  29 29 33*  --   GLUCOSE 94 118* 94  --   --  120* 98 112*  --   BUN 17 18 14   --   --  14 16 19   --   CREATININE 0.66 0.58* 0.47*  --   --  0.50* 0.53* 0.61  --   CALCIUM 8.9 8.8* 8.8*  --   --  8.9 8.4* 8.8*  --   AST 16  --   --   --   --  23  --   --   --   ALT 9  --   --   --   --  13*  --   --   --   ALKPHOS 57  --   --   --   --  45  --   --   --   BILITOT 0.5  --   --   --   --  0.8  --   --   --   < > = values in this interval not displayed. ------------------------------------------------------------------------------------------------------------------ estimated creatinine clearance is 68.9 mL/min (by C-G formula based on Cr of 0.61). ------------------------------------------------------------------------------------------------------------------ No results for input(s): TSH, T4TOTAL, T3FREE, THYROIDAB in the last 72 hours.  Invalid input(s): FREET3  Cardiac Enzymes No results for input(s): CKMB, TROPONINI, MYOGLOBIN in the last 168 hours.  Invalid input(s): CK ------------------------------------------------------------------------------------------------------------------ Invalid input(s): POCBNP ---------------------------------------------------------------------------------------------------------------  RADIOLOGY: Dg Abd 1 View  02/06/2015  CLINICAL DATA:  Assess NG tube placement ; impaired NG tube feeding EXAM: ABDOMEN - 1 VIEW COMPARISON:  Chest and abdominal film of February 05, 2015 FINDINGS: The radiodense tip of the feeding to lies in the region of the gastric body laterally. The stomach does not appear distended with gas. There is contrast in the ascending, transverse, and proximal descending colon with no significant small bowel contrast demonstrated. IMPRESSION: The feeding tube tip lies in  the proximal aspect of the gastric body. Advancement of the tube for the distal stomach would be useful. This may occur spontaneously with additional tube slack being provided and turning the patient onto his right side. Electronically Signed   By: David  Martinique M.D.   On: 02/06/2015 08:00   Dg Abd 1 View  02/05/2015  CLINICAL DATA:  Encounter for NG tube placement EXAM: ABDOMEN - 1 VIEW COMPARISON:  None. FINDINGS: NG tube is in place with the tip in the mid stomach. Nonobstructive bowel gas pattern. IMPRESSION: NG tube tip in the mid stomach. Electronically Signed   By: Rolm Baptise M.D.   On: 02/05/2015 15:38   Dg Chest Port 1 View  02/07/2015  CLINICAL DATA:  Aspiration. EXAM: PORTABLE CHEST 1 VIEW COMPARISON:  02/05/2015. FINDINGS: Orogastric tube noted with tip below left hemidiaphragm. Mediastinum hilar structures normal. Cardiomegaly. No pulmonary venous congestion. Mild left base subsegmental atelectasis and/or infiltrate. No pleural effusion or pneumothorax. IMPRESSION: 1. Orogastric tube noted with tip below left hemidiaphragm. 2. Mild left base subsegmental atelectasis and or infiltrate. 3. Mild cardiomegaly.  No  pulmonary venous congestion. Electronically Signed   By: Marcello Moores  Register   On: 02/07/2015 07:24    EKG:  Orders placed or performed in visit on 03/17/14  . EKG 12-Lead    ASSESSMENT AND PLAN:  Active Problems:   Hyponatremia 1 acute respiratory failure with hypoxia, aspiration related, repeated chest xray revealed left-sided atelectasis versus pneumonia, continue O2 per nasal cannulas ,  patient is placed nothing by mouth and NG tube feeds  are initiated after tube is placed , initiate antibiotics since patient is expectorating gray yellow looking thick phlegm and chest x-ray shows pneumonia   2 dysphagia,  status post recurrent aspirations , now patient is NPO, SLP is consulted , modified barium swallow study done today revealed aspirations and the esophageal abnormalities,  esophageal  dysmotility and questionable mass in the esophagus. Getting CT scan of the neck soft tissues and getting GI involved for further recommendations.  NG tube was placed on January 9, tube feeds were initiated by dietary, brain  and cervical spine [facility] done at [facility] revealed multilevel spondylosis, mostly pronounced at C2-C3 vertebral level , severe spinal canal stenosis. Patient refused a repeated MRI here in this hospital.  Palliative care was consulted to discuss risks of eating after modified barium swallowing study was done today , appreciate input. Patient is DO NOT RESUSCITATE per his request  3. Hyponatremia, due to  SIADH,  on fluid restriction, but also on low rate IV fluids due to him being nothing by mouth , sodium level has improved to 127 yesterday, rechecking today 4. Orthostatic hypotension,  due to adrenal insufficiency,  continue high dose of fludrocortisone.  5. Leukopenia, chronic, stable  6.  anemia, follow , no bleeding noted  7. Generalized weakness with falls, appreciate physical therapist input, patient will be discharged home with home health physical therapy, although I would recommend lifepath 8. Hypoxia while standing, echocardiogram showed no shunt or hypertrophic cardiomyopathy , ABGs revealed mild hypoxemia, CT angiogram of the chest showed no PE but nodular consolidations in the lungs, likely aspiration pneumonitis. This patient started expectorating green thick yellow looking phlegm aware initiating him on antibiotic therapy, following clinically, continue oxygen therapy and wean off to room air as tolerated   Management plans discussed with the patient, family and they are in agreement.   DRUG ALLERGIES:  Allergies  Allergen Reactions  . Simvastatin Anxiety    CODE STATUS:     Code Status Orders        Start     Ordered   02/02/15 2132  Full code   Continuous     02/02/15 2131      TOTAL TIME TAKING CARE OF THIS PATIENT . 45 minutes.  Prolonged  discussion with speech therapist about modified barium swallowing study. Reviewing images, spent approximately 10 minutes    Jahmire Ruffins M.D on 02/07/2015 at 1:42 PM  Between 7am to 6pm - Pager - [phone]  After 6pm go to [email or web address] - password [email or web address]  Office  [phone]  CC: Primary care physician; Rica Mast, MD

## 2015-02-07 NOTE — Evaluation (Signed)
Objective Swallowing Evaluation: Bedside Swallow Evaluation  Patient Details  Name: OLUWATOBI KINDSCHI MRN: [id number] Date of Birth: 1940-05-21  Today's Date: 02/07/2015 Time: SLP Start Time (ACUTE ONLY): 1100-SLP Stop Time (ACUTE ONLY): 1200 SLP Time Calculation (min) (ACUTE ONLY): 60 min  Past Medical History:  Past Medical History  Diagnosis Date  . HLD (hyperlipidemia)   . HTN (hypertension)   . GERD (gastroesophageal reflux disease)   . Hypothyroidism   . 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 CVA (cerebral vascular accident) ([place])   . DA (degenerative arthritis)   . RA (rheumatoid arthritis) ([place])   . Adrenal insufficiency ([place])   . Cervical spinal stenosis   . Falls   . Collagen vascular disease [facility])    Past Surgical History:  Past Surgical History  Procedure Laterality Date  . Carotid endarterectomy      left  . Skin lesion excision      Dr. Sarajane Jews, and Dr. Vickki Muff at [facility]   HPI: Pt is a 75 y.o. male with a known history of hypertension, RA, DA, hyperlipidemia, GERD, bilateral carotid stenosis status post left carotid endarterectomy, severe cervical spine stenosis presents to the hospital secondary to weakness, confusion and falls lately at home. Seen by PCP 3 days ago and had blood work done. Sodium outpatient was 123. Patient has history of SIADH and is supposed to be on fluid restriction, however has been drinking a lot of protein shakes and water. He has chronic xerostomia and dry mouth so he feels thirsty and drinks plenty of fluids. Is alert and oriented at this time. Denies any dizziness or aura prior to the falls. Pt also has a h/o throat cancer - sqamous cell. He received ~34 txs of radiation per Wife. Pt has had some degree of Dysphagia for ~6 mos. which pt stated was progressing. On Sunday, pt was witnessed to "choke" on his foods followed by vomiting then a Code was called sec. to pt's unresponsiveness. Pt and wife stated he has "not choked that bad at home" but  he indicated he has had trouble swallowing meats/breads. Pt received an NG tube for nutritional support post a Barium study on Monday in which aspiration occured. NG has now been removed for this MBSS, and pt wants to eat/drink again.   Subjective: pt awake, wanting to eat/drink  Assessment / Plan / Recommendation  CHL IP CLINICAL IMPRESSIONS 02/07/2015  Therapy Diagnosis Severe cervical esophageal phase dysphagia;Moderate pharyngeal phase dysphagia  Clinical Impression Pt presented w/ severe esophageal phase dysphagia w/ retention of bolus residue during trials; little to no parastalsis or clearing of upper Esophagus was noted w/ trials given. During the pharyngeal phase of swallowing, noted min. delayed pharyngeal swallow initiation w/ po trials w/ decreased laryngeal excursion and anterior movement; decreased pharyngeal pressure. This resulted in pharyngeal residue in the valleculae and what appeared to be the pyriform sinuses. With f/u swallows, this decreased but residue remained in what appeared to the be pyriform sinuses. With trials of thin liquids, liquids spilled from the lower pharygneal area/pyriform sinuses into the laryngeal vestibule then were aspirated. Pt was not immediately sensate to the laryngeal penetration or aspiration but then responded w/ a delayed half cough/throat clear. No gross deficits were noted w/ the trials of liquids and puree given; trials were minimal overall. Due to the presentation of the Esophageal dysmotility and pharyngeal residue, no further trials were attempted to reduce risk for aspiration. Study ended; MD consulted and viewed the exam.  MD will f/u w/ further consult w/ tests and GI/ENT consult. Wife and pt informed of the eval results. Palliative care updated.  Impact on safety and function Severe aspiration risk      CHL IP TREATMENT RECOMMENDATION 02/07/2015  Treatment Recommendations (No Data)     Prognosis 02/07/2015  Prognosis for Safe Diet Advancement  Guarded  Barriers to Reach Goals Severity of deficits  Barriers/Prognosis Comment --    CHL IP DIET RECOMMENDATION 02/07/2015  SLP Diet Recommendations NPO  Liquid Administration via --  Medication Administration Via alternative means  Compensations --  Postural Changes --      CHL IP OTHER RECOMMENDATIONS 02/07/2015  Recommended Consults Consider ENT evaluation;Consider GI evaluation;Consider esophageal assessment  Oral Care Recommendations Oral care QID;Patient independent with oral care  Other Recommendations (No Data)      CHL IP FOLLOW UP RECOMMENDATIONS 02/07/2015  Follow up Recommendations (No Data)      CHL IP FREQUENCY AND DURATION 02/07/2015  Speech Therapy Frequency (ACUTE ONLY) (No Data)  Treatment Duration (No Data)                   Watson,Katherine 02/07/2015, 2:20 PM

## 2015-02-07 NOTE — Progress Notes (Signed)
[facility]  ROUNDING NOTE   Subjective:   Sodium is up to 130 this morning. Most recent sodium is 128 at 2:00 Patient had some barium swallow study which demonstrates problems with swallowing   Objective:  Vital signs in last 24 hours:  Temp:  [97.7 F (36.5 C)-98 F (36.7 C)] 98 F (36.7 C) (01/11 0423) Pulse Rate:  [70-73] 71 (01/11 0423) Resp:  [18-22] 18 (01/11 0423) BP: (107-137)/(63-84) 133/70 mmHg (01/11 0423) SpO2:  [96 %-99 %] 96 % (01/11 0759) Weight:  [63.866 kg (140 lb 12.8 oz)] 63.866 kg (140 lb 12.8 oz) (01/11 0500)  Weight change: 1.724 kg (3 lb 12.8 oz) Filed Weights   02/02/15 1639 02/05/15 1819 02/07/15 0500  Weight: 67.132 kg (148 lb) 62.143 kg (137 lb) 63.866 kg (140 lb 12.8 oz)    Intake/Output: I/O last 3 completed shifts: In: 1950.3 [I.V.:519.3; NG/GT:1431] Out: 725 [Urine:725]   Intake/Output this shift:  Total I/O In: 233 [NG/GT:233] Out: 200 [Urine:200]  Physical Exam: General: NAD  Head: Normocephalic, atraumatic. Dry oral mucosal membranes  Eyes/ENT: Anicteric, NGT  Neck: Supple, trachea midline  Lungs:  Clear to auscultation  Heart: Regular rate and rhythm  Abdomen:  Soft, nontender,   Extremities: no peripheral edema.  Neurologic: Nonfocal, moving all four extremities, strength 5/5 all four extremities.   Skin: No lesions       Basic Metabolic Panel:  Recent Labs Lab 02/02/15 1646 02/03/15 0557  02/04/15 0426 02/05/15 0502 02/06/15 0617 02/07/15 0536 02/07/15 1358  NA 126* 125*  < > 123* 121* 127* 130* 128*  K 4.0 3.9  --  3.6 3.7 3.5  --   --   CL 87* 86*  --  85* 85* 88*  --   --   CO2 33* 31  --  29 29 33*  --   --   GLUCOSE 118* 94  --  120* 98 112*  --   --   BUN 18 14  --  14 16 19   --   --   CREATININE 0.58* 0.47*  --  0.50* 0.53* 0.61  --   --   CALCIUM 8.8* 8.8*  --  8.9 8.4* 8.8*  --   --   < > = values in this interval not displayed.  Liver Function Tests:  Recent Labs Lab  02/02/15 1154 02/04/15 0426  AST 16 23  ALT 9 13*  ALKPHOS 57 45  BILITOT 0.5 0.8  PROT 6.7 7.0  ALBUMIN 3.9 3.5   No results for input(s): LIPASE, AMYLASE in the last 168 hours. No results for input(s): AMMONIA in the last 168 hours.  CBC:  Recent Labs Lab 02/02/15 1646 02/03/15 0557 02/04/15 0426 02/06/15 0617  WBC 2.8* 2.4* 4.1 3.2*  NEUTROABS 1.5  --   --   --   HGB 9.6* 10.2* 10.3* 9.7*  HCT 28.2* 28.5* 29.3* 27.9*  MCV 128.9* 128.2* 127.7* 127.8*  PLT 424 392 408 381    Cardiac Enzymes: No results for input(s): CKTOTAL, CKMB, CKMBINDEX, TROPONINI in the last 168 hours.  BNP: Invalid input(s): POCBNP  CBG: No results for input(s): GLUCAP in the last 168 hours.  Microbiology: Results for orders placed or performed in visit on 02/13/13  Influenza A&B Antigens [facility])     Status: None   Collection Time: 02/13/13  3:07 PM  Result Value Ref Range Status   Micro Text Report   Final       COMMENT  NEGATIVE FOR INFLUENZA A (ANTIGEN ABSENT)   COMMENT                   NEGATIVE FOR INFLUENZA B (ANTIGEN ABSENT)   ANTIBIOTIC                                                        Coagulation Studies: No results for input(s): LABPROT, INR in the last 72 hours.  Urinalysis: No results for input(s): COLORURINE, LABSPEC, PHURINE, GLUCOSEU, HGBUR, BILIRUBINUR, KETONESUR, PROTEINUR, UROBILINOGEN, NITRITE, LEUKOCYTESUR in the last 72 hours.  Invalid input(s): APPERANCEUR    Imaging: Dg Abd 1 View  02/06/2015  CLINICAL DATA:  Assess NG tube placement ; impaired NG tube feeding EXAM: ABDOMEN - 1 VIEW COMPARISON:  Chest and abdominal film of February 05, 2015 FINDINGS: The radiodense tip of the feeding to lies in the region of the gastric body laterally. The stomach does not appear distended with gas. There is contrast in the ascending, transverse, and proximal descending colon with no significant small bowel contrast demonstrated. IMPRESSION: The feeding  tube tip lies in the proximal aspect of the gastric body. Advancement of the tube for the distal stomach would be useful. This may occur spontaneously with additional tube slack being provided and turning the patient onto his right side. Electronically Signed   By: David  Martinique M.D.   On: 02/06/2015 08:00   Dg Abd 1 View  02/05/2015  CLINICAL DATA:  Encounter for NG tube placement EXAM: ABDOMEN - 1 VIEW COMPARISON:  None. FINDINGS: NG tube is in place with the tip in the mid stomach. Nonobstructive bowel gas pattern. IMPRESSION: NG tube tip in the mid stomach. Electronically Signed   By: Rolm Baptise M.D.   On: 02/05/2015 15:38   Ct Soft Tissue Neck W Contrast  02/07/2015  CLINICAL DATA:  History of aspiration and SIADH. EXAM: CT NECK WITH CONTRAST TECHNIQUE: Multidetector CT imaging of the neck was performed using the standard protocol following the bolus administration of intravenous contrast. CONTRAST:  75 mL OMNIPAQUE IOHEXOL 300 MG/ML  SOLN COMPARISON:  Chest CT scan 02/03/2015. FINDINGS: Pharynx and larynx: Unremarkable. Salivary glands: Unremarkable. Thyroid: Unremarkable. Lymph nodes: None. Vascular: The patient has dense carotid atherosclerotic vascular disease. Scattered atherosclerotic calcifications of the vertebral arteries also identified. Limited intracranial: No focal abnormality is seen. Visualized orbits: Unremarkable. Mastoids and visualized paranasal sinuses: Clear. Skeleton: No lytic or sclerotic bony lesion is seen. The patient has multilevel cervical spondylosis. Upper chest: Mild biapical scar is seen as on the prior chest CT. There is a new small focus of ground-glass attenuation the refer the right upper lobe. IMPRESSION: No acute abnormality. Extensive atherosclerotic vascular disease. Small focus of ground-glass attenuation the periphery of the right upper lobe is new since the comparison chest CT and may represent focal inflammatory change. Extensive atherosclerosis. Electronically  Signed   By: Inge Rise M.D.   On: 02/07/2015 14:07   Dg Chest Port 1 View  02/07/2015  CLINICAL DATA:  Aspiration. EXAM: PORTABLE CHEST 1 VIEW COMPARISON:  02/05/2015. FINDINGS: Orogastric tube noted with tip below left hemidiaphragm. Mediastinum hilar structures normal. Cardiomegaly. No pulmonary venous congestion. Mild left base subsegmental atelectasis and/or infiltrate. No pleural effusion or pneumothorax. IMPRESSION: 1. Orogastric tube noted with tip below left hemidiaphragm. 2. Mild left base subsegmental  atelectasis and or infiltrate. 3. Mild cardiomegaly.  No pulmonary venous congestion. Electronically Signed   By: Marcello Moores  Register   On: 02/07/2015 07:24     Medications:   Scheduled Meds: . antiseptic oral rinse  7 mL Mouth Rinse BID  . aspirin  162 mg Oral Daily  . docusate sodium  100 mg Oral BID  . enoxaparin (LOVENOX) injection  40 mg Subcutaneous QHS  . escitalopram  20 mg Oral Daily  . fludrocortisone  0.1 mg Oral Daily  . hydroxyurea  500 mg Oral TID WC  . ipratropium-albuterol  3 mL Nebulization Q4H  . levofloxacin (LEVAQUIN) IV  750 mg Intravenous Q24H  . levothyroxine  137 mcg Oral QAC breakfast  . mometasone-formoterol  2 puff Inhalation BID  . omega-3 acid ethyl esters  1 g Oral Daily  . pantoprazole sodium  40 mg Per Tube QAC breakfast  . sodium chloride  3 mL Intravenous Q12H  . vitamin B-12  1,000 mcg Oral Daily   Continuous Infusions: . 0.9 % NaCl with KCl 20 mEq / L 40 mL/hr at 02/06/15 1736   PRN Meds:.acetaminophen **OR** acetaminophen, clonazePAM, fluticasone, guaiFENesin, meclizine, nystatin, ondansetron **OR** ondansetron (ZOFRAN) IV, polyethylene glycol, traZODone    Assessment/ Plan:  Mr. JORMA ROSSEY is a 75 y.o. white male  with hypo-osmolar euvolemic hyponatremia (adrenal insufficiency versus SIADH), pulmonary nodules, metabolic encephalopathy secondary to hyponatremia, hypertension, hypothyroidism, coronary artery disease, major  depressive disorder, essential thrombocytosis presents for followup for hyponatremia.   1. Hyponatremia: hypo-osmolar and euvolemic. Dry mucosal membranes are baseline for patient due to previous parotid gland surgery  - Goal serum sodium of >130.  - Continue fludrocortisone.  -  Serial sodium checks to daily - TF now stopped  - swallow eval in progress  2. Hypotension: continue fludrocortisone for adrenal insufficiency.   3. Anemia/Thrombocytopenia: hemoglobin 9.7 on hydroxyurea as outpatient. Platelets currently at goal.        LOS: 5 Naoki Migliaccio 1/11/20172:47 PM

## 2015-02-08 LAB — BASIC METABOLIC PANEL
Anion gap: 5 (ref 5–15)
BUN: 13 mg/dL (ref 6–20)
CHLORIDE: 92 mmol/L — AB (ref 101–111)
CO2: 33 mmol/L — AB (ref 22–32)
Calcium: 8.6 mg/dL — ABNORMAL LOW (ref 8.9–10.3)
Creatinine, Ser: 0.52 mg/dL — ABNORMAL LOW (ref 0.61–1.24)
GFR calc Af Amer: >60 mL/min (ref >60)
GFR calc non Af Amer: >60 mL/min (ref >60)
GLUCOSE: 101 mg/dL — AB (ref 65–99)
POTASSIUM: 4.5 mmol/L (ref 3.5–5.1)
Sodium: 130 mmol/L — ABNORMAL LOW (ref 135–145)

## 2015-02-08 MED ORDER — LORAZEPAM 2 MG/ML IJ SOLN
0.5000 mg | Freq: Four times a day (QID) | INTRAMUSCULAR | Status: DC | PRN
  Administered 2015-02-08 – 2015-02-09 (×2): 0.5 mg via INTRAVENOUS
  Filled 2015-02-08 (×2): qty 1

## 2015-02-08 NOTE — Plan of Care (Signed)
Problem: Fluid Volume: Goal: Ability to maintain a balanced intake and output will improve Outcome: Not Progressing Pt for EGD with PEG tube placement tomorrow. Currently NPO. Failed MBSS x 2.

## 2015-02-08 NOTE — Progress Notes (Signed)
[facility] [place] NAME: Johnny Sanford    MR#:  [id number]  DATE OF BIRTH:  11-Oct-1940  SUBJECTIVE:  CHIEF COMPLAINT:   Chief Complaint  Patient presents with  . low sodium     low sodium   The patient is 75 year old male with past medical history significant for the. History of SIADH, who presents to the hospital with complaints of low sodium levels, 123. Apparently patient has been drinking plenty of fluids, although was recommended to be on fluid restriction. In the hospital patient was noted to be orthostatically hypotensive and IV fluids were administered. His sodium level has improved from 123-126  initially but then dropped down to 119. Patient's blood pressure drops down to 70s on standing, rehydrated, on fludrocortisone for adrenal insufficiency. Patient was hypoxic while standing yesterday. The angiogram of the chest revealed nodular consolidations in the lungs , likely inflammatory, but no pulmonary embolism He denies any discomfort, patient wants to go home. Physical therapist and recommended home health physical therapy.  Sodium has improved to 123 with fluid restriction and sodium tablets. Aspirated day before yesterday and the [facility] was called for respiratory arrest. Patient's airways were suctioned oxygen applied and he became responsive. Modified barium barium swallowing study was ordered, however, not performed due to broken equipment. Barium swallowing study then was ordered, however, patient aspirated barium. NG tube was placed and tube feeds were initiated, now on hold for PEG tube placement tomorrow by Dr. Candace Cruise. Patient is agreeable to continue nothing by mouth in order to heal his lungs for possible cervical spine surgery.    Review of Systems  Unable to perform ROS: acuity of condition    VITAL SIGNS: Blood pressure 136/77, pulse 70, temperature 97.5 F (36.4 C), temperature source Oral, resp. rate 18, height 5\' 4"  (1.626  m), weight 63.504 kg (140 lb), SpO2 98 %.  PHYSICAL EXAMINATION:   GENERAL:  75 y.o.-year-old patient lying in the bed in mild respiratory  Distress. Dry oral mucosa, hoarseness EYES: Pupils equal, round, reactive to light and accommodation. No scleral icterus. Extraocular muscles intact.  HEENT: Head atraumatic, normocephalic. Oropharynx and nasopharynx clear.  NECK:  Supple, no jugular venous distention. No thyroid enlargement, no tenderness.  LUNGS: Better air entrance bilaterally, no wheezing,  left sided rhonchi,, few right sided rhonchi, no crepitations. Intermittently using accessory muscles of respiration, dyspneic, but much less coughing.  CARDIOVASCULAR: S1, S2 normal. No murmurs, rubs, or gallops.  ABDOMEN: Soft, nontender, nondistended. Bowel sounds present. No organomegaly or mass.  EXTREMITIES: No pedal edema, cyanosis, or clubbing.  NEUROLOGIC: Cranial nerves II through XII are intact. Muscle strength 5/5 in all extremities. Sensation intact. Gait not checked.  PSYCHIATRIC: The patient is some alert and oriented.  SKIN: No obvious rash, lesion, or ulcer.   ORDERS/RESULTS REVIEWED:   CBC  Recent Labs Lab 02/02/15 1646 02/03/15 0557 02/04/15 0426 02/06/15 0617  WBC 2.8* 2.4* 4.1 3.2*  HGB 9.6* 10.2* 10.3* 9.7*  HCT 28.2* 28.5* 29.3* 27.9*  PLT 424 392 408 381  MCV 128.9* 128.2* 127.7* 127.8*  MCH 44.2* 45.6* 45.0* 44.4*  MCHC 34.3 35.6 35.2 34.7  RDW 14.7* 14.7* 14.7* 14.8*  LYMPHSABS 0.9*  --   --   --   MONOABS 0.4  --   --   --   EOSABS 0.0  --   --   --   BASOSABS 0.0  --   --   --    ------------------------------------------------------------------------------------------------------------------  Chemistries   Recent Labs Lab 02/02/15 1154  02/03/15 0557  02/04/15 0426 02/05/15 0502 02/06/15 0617 02/07/15 0536 02/07/15 1358 02/08/15 0706  NA 123*  < > 125*  < > 123* 121* 127* 130* 128* 130*  K 4.7  < > 3.9  --  3.6 3.7 3.5  --   --  4.5  CL  84*  < > 86*  --  85* 85* 88*  --   --  92*  CO2 34*  < > 31  --  29 29 33*  --   --  33*  GLUCOSE 94  < > 94  --  120* 98 112*  --   --  101*  BUN 17  < > 14  --  14 16 19   --   --  13  CREATININE 0.66  < > 0.47*  --  0.50* 0.53* 0.61  --   --  0.52*  CALCIUM 8.9  < > 8.8*  --  8.9 8.4* 8.8*  --   --  8.6*  AST 16  --   --   --  23  --   --   --   --   --   ALT 9  --   --   --  13*  --   --   --   --   --   ALKPHOS 57  --   --   --  45  --   --   --   --   --   BILITOT 0.5  --   --   --  0.8  --   --   --   --   --   < > = values in this interval not displayed. ------------------------------------------------------------------------------------------------------------------ estimated creatinine clearance is 68.9 mL/min (by C-G formula based on Cr of 0.52). ------------------------------------------------------------------------------------------------------------------ No results for input(s): TSH, T4TOTAL, T3FREE, THYROIDAB in the last 72 hours.  Invalid input(s): FREET3  Cardiac Enzymes No results for input(s): CKMB, TROPONINI, MYOGLOBIN in the last 168 hours.  Invalid input(s): CK ------------------------------------------------------------------------------------------------------------------ Invalid input(s): POCBNP ---------------------------------------------------------------------------------------------------------------  RADIOLOGY: Ct Soft Tissue Neck W Contrast  02/07/2015  CLINICAL DATA:  History of aspiration and SIADH. EXAM: CT NECK WITH CONTRAST TECHNIQUE: Multidetector CT imaging of the neck was performed using the standard protocol following the bolus administration of intravenous contrast. CONTRAST:  75 mL OMNIPAQUE IOHEXOL 300 MG/ML  SOLN COMPARISON:  Chest CT scan 02/03/2015. FINDINGS: Pharynx and larynx: Unremarkable. Salivary glands: Unremarkable. Thyroid: Unremarkable. Lymph nodes: None. Vascular: The patient has dense carotid atherosclerotic vascular disease.  Scattered atherosclerotic calcifications of the vertebral arteries also identified. Limited intracranial: No focal abnormality is seen. Visualized orbits: Unremarkable. Mastoids and visualized paranasal sinuses: Clear. Skeleton: No lytic or sclerotic bony lesion is seen. The patient has multilevel cervical spondylosis. Upper chest: Mild biapical scar is seen as on the prior chest CT. There is a new small focus of ground-glass attenuation the refer the right upper lobe. IMPRESSION: No acute abnormality. Extensive atherosclerotic vascular disease. Small focus of ground-glass attenuation the periphery of the right upper lobe is new since the comparison chest CT and may represent focal inflammatory change. Extensive atherosclerosis. Electronically Signed   By: Inge Rise M.D.   On: 02/07/2015 14:07   Dg Chest Port 1 View  02/07/2015  CLINICAL DATA:  Aspiration. EXAM: PORTABLE CHEST 1 VIEW COMPARISON:  02/05/2015. FINDINGS: Orogastric tube noted with tip below left hemidiaphragm. Mediastinum hilar structures normal. Cardiomegaly. No pulmonary venous congestion.  Mild left base subsegmental atelectasis and/or infiltrate. No pleural effusion or pneumothorax. IMPRESSION: 1. Orogastric tube noted with tip below left hemidiaphragm. 2. Mild left base subsegmental atelectasis and or infiltrate. 3. Mild cardiomegaly.  No pulmonary venous congestion. Electronically Signed   By: Marcello Moores  Register   On: 02/07/2015 07:24    EKG:  Orders placed or performed in visit on 03/17/14  . EKG 12-Lead    ASSESSMENT AND PLAN:  Active Problems:   Hyponatremia *acute respiratory failure with hypoxia, aspiration related, repeated chest xray revealed left-sided atelectasis versus pneumonia, continue O2 per nasal cannulas ,  patient is placed nothing by mouth , continue oxygen and wean to room air as tolerated *Aspiration pneumonitis, initiated on levofloxacin intravenously continue this medication, some improvement with  therapy *dysphagia,  felt to be due to cervical spinal pathology, rule out esophageal pathology, status post recurrent aspirations , now patient is NPO, SLP is consulted , modified barium swallow study done today revealed aspirations and esophageal abnormalities, 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 Because of concern of esophageal mass CT scan of the neck soft tissues was performed. However, it was unremarkable , as radiology consultation was obtained for PEG tube placement and EGD is going to be performed as well as PEG tube is going to be placed 13th of January . Brain  and cervical spine MRI done at [facility] revealed multilevel spondylosis, mostly pronounced at C2-C3 vertebral level , severe spinal canal stenosis. Patient refused a repeated MRI here in this hospital.  Palliative care was consulted to discuss risks of eating after modified barium swallowing study was done today , appreciate input. Patient is DO NOT RESUSCITATE per his request  *. Hyponatremia, due to  SIADH,  sodium level has been stable at 131 on  low rate IV fluid administration *. Orthostatic hypotension,  due to adrenal insufficiency,  continue high dose of fludrocortisone.  *. Leukopenia, chronic, stable  *.  anemia, follow , no bleeding noted  *. Generalized weakness with falls, appreciate physical therapist input, patient will be discharged home with home health physical therapy, although I would recommend lifepath, patient is agreeable *Cervical spinal stenosis, patient is to get second opinion from neurosurgery in the next 1-2 weeks.  Management plans discussed with the patient, family and they are in agreement.   DRUG ALLERGIES:  Allergies  Allergen Reactions  . Simvastatin Anxiety    CODE STATUS:     Code Status Orders        Start     Ordered   02/02/15 2132  Full code   Continuous     02/02/15 2131      TOTAL TIME TAKING CARE OF THIS PATIENT . 35 minutes.   Theodoro Grist M.D on 02/08/2015 at 1:33 PM  Between  7am to 6pm - Pager - [phone]  After 6pm go to [email or web address] - password [email or web address]  Office  [phone]  CC: Primary care physician; Rica Mast, MD

## 2015-02-08 NOTE — Consult Note (Signed)
  GI Inpatient Follow-up Note  Patient Identification: MIA SEEGARS is a 75 y.o. male with aspiration pneumonia. Please see Rushie Chestnut' notes from yesterday.  Subjective: Breathing better resting on O2. Had many questions regarding PEG placement vs alternatives.  Scheduled Inpatient Medications:  . antiseptic oral rinse  7 mL Mouth Rinse BID  . aspirin  162 mg Oral Daily  . docusate sodium  100 mg Oral BID  . escitalopram  20 mg Oral Daily  . fludrocortisone  0.1 mg Oral Daily  . hydroxyurea  500 mg Oral TID WC  . ipratropium-albuterol  3 mL Nebulization Q4H  . levofloxacin (LEVAQUIN) IV  750 mg Intravenous Q24H  . levothyroxine  137 mcg Oral QAC breakfast  . mometasone-formoterol  2 puff Inhalation BID  . omega-3 acid ethyl esters  1 g Oral Daily  . pantoprazole sodium  40 mg Per Tube QAC breakfast  . sodium chloride  3 mL Intravenous Q12H  . vitamin B-12  1,000 mcg Oral Daily    Continuous Inpatient Infusions:   . 0.9 % NaCl with KCl 20 mEq / L 30 mL/hr at 02/08/15 0329    PRN Inpatient Medications:  acetaminophen **OR** acetaminophen, clonazePAM, fluticasone, guaiFENesin, meclizine, nystatin, ondansetron **OR** ondansetron (ZOFRAN) IV, polyethylene glycol, traZODone  Review of Systems: Constitutional: Weight is stable.  Eyes: No changes in vision. ENT: No oral lesions, sore throat.  GI: see HPI.  Heme/Lymph: No easy bruising.  CV: No chest pain.  GU: No hematuria.  Integumentary: No rashes.  Neuro: No headaches.  Psych: No depression/anxiety.  Endocrine: No heat/cold intolerance.  Allergic/Immunologic: No urticaria.  Resp: No cough, SOB.  Musculoskeletal: No joint swelling.    Physical Examination: BP 136/77 mmHg  Pulse 70  Temp(Src) 97.5 F (36.4 C) (Oral)  Resp 18  Ht 5\' 4"  (1.626 m)  Wt 63.504 kg (140 lb)  BMI 24.02 kg/m2  SpO2 98% Gen: NAD, alert and oriented x 4 HEENT: PEERLA, EOMI, Neck: supple, no JVD or thyromegaly Chest: CTA bilaterally, no  wheezes, crackles, or other adventitious sounds CV: RRR, no m/g/c/r Abd: soft, NT, ND, +BS in all four quadrants; no HSM, guarding, ridigity, or rebound tenderness; surgical scars from previous abdominal surgeries. Ext: no edema, well perfused with 2+ pulses, Skin: no rash or lesions noted Lymph: no LAD  Data: Lab Results  Component Value Date   WBC 3.2* 02/06/2015   HGB 9.7* 02/06/2015   HCT 27.9* 02/06/2015   MCV 127.8* 02/06/2015   PLT 381 02/06/2015    Recent Labs Lab 02/03/15 0557 02/04/15 0426 02/06/15 0617  HGB 10.2* 10.3* 9.7*   Lab Results  Component Value Date   NA 130* 02/08/2015   K 4.5 02/08/2015   CL 92* 02/08/2015   CO2 33* 02/08/2015   BUN 13 02/08/2015   CREATININE 0.52* 02/08/2015   GLU 93 05/22/2014   Lab Results  Component Value Date   ALT 13* 02/04/2015   AST 23 02/04/2015   ALKPHOS 45 02/04/2015   BILITOT 0.8 02/04/2015   No results for input(s): APTT, INR, PTT in the last 168 hours. Assessment/Plan: Mr. Brosnahan is a 75 y.o. male with dysphagia and aspiration risks.   Recommendations: Pt and wife agreed to have PEG scheduled tomorrow. Pt aware of possible inability to place G tube due to scar tissue. Continue NPO. Hold lovenox. Prophylactic Abx. Thanks. Please call with questions or concerns.  Melburn Treiber, Lupita Dawn, MD

## 2015-02-09 ENCOUNTER — Inpatient Hospital Stay: Payer: Medicare Other | Admitting: Anesthesiology

## 2015-02-09 ENCOUNTER — Encounter: Payer: Self-pay | Admitting: Anesthesiology

## 2015-02-09 ENCOUNTER — Encounter
Admission: EM | Disposition: A | Discharge status: Home Health | Payer: Self-pay | Source: Home / Self Care | Attending: Internal Medicine

## 2015-02-09 HISTORY — PX: PEG PLACEMENT: SHX5437

## 2015-02-09 SURGERY — INSERTION, PEG TUBE
Anesthesia: General

## 2015-02-09 MED ORDER — SODIUM CHLORIDE 0.9 % IV SOLN
INTRAVENOUS | Status: DC
  Administered 2015-02-09: 14:00:00 via INTRAVENOUS

## 2015-02-09 MED ORDER — ESCITALOPRAM OXALATE 10 MG PO TABS
20.0000 mg | ORAL_TABLET | Freq: Every day | ORAL | Status: DC
  Administered 2015-02-10 – 2015-02-12 (×3): 20 mg
  Filled 2015-02-09 (×3): qty 2

## 2015-02-09 MED ORDER — PHENYLEPHRINE HCL 10 MG/ML IJ SOLN
INTRAMUSCULAR | Status: DC | PRN
  Administered 2015-02-09: 100 ug via INTRAVENOUS

## 2015-02-09 MED ORDER — ACETAMINOPHEN 325 MG PO TABS: 650.0000 mg | ORAL_TABLET | Freq: Four times a day (QID) | ORAL | Status: DC | PRN

## 2015-02-09 MED ORDER — FLUDROCORTISONE ACETATE 0.1 MG PO TABS
0.1000 mg | ORAL_TABLET | Freq: Every day | ORAL | Status: DC
  Administered 2015-02-10 – 2015-02-12 (×3): 0.1 mg
  Filled 2015-02-09 (×3): qty 1

## 2015-02-09 MED ORDER — LEVOTHYROXINE SODIUM 137 MCG PO TABS
137.0000 ug | ORAL_TABLET | Freq: Every day | ORAL | Status: DC
  Administered 2015-02-10 – 2015-02-12 (×3): 137 ug
  Filled 2015-02-09 (×3): qty 1

## 2015-02-09 MED ORDER — MECLIZINE HCL 25 MG PO TABS: 25.0000 mg | ORAL_TABLET | Freq: Two times a day (BID) | ORAL | Status: DC | PRN

## 2015-02-09 MED ORDER — CLONAZEPAM 0.5 MG PO TABS: 0.5000 mg | ORAL_TABLET | Freq: Three times a day (TID) | ORAL | Status: DC | PRN

## 2015-02-09 MED ORDER — ACETAMINOPHEN 650 MG RE SUPP: 650.0000 mg | Freq: Four times a day (QID) | RECTAL | Status: DC | PRN

## 2015-02-09 MED ORDER — MORPHINE SULFATE (PF) 2 MG/ML IV SOLN
2.0000 mg | INTRAVENOUS | Status: DC | PRN
  Administered 2015-02-09 – 2015-02-10 (×4): 2 mg via INTRAVENOUS
  Filled 2015-02-09 (×4): qty 1

## 2015-02-09 MED ORDER — PROPOFOL 10 MG/ML IV BOLUS
INTRAVENOUS | Status: DC | PRN
  Administered 2015-02-09 (×4): 30 mg via INTRAVENOUS

## 2015-02-09 MED ORDER — CEFAZOLIN SODIUM-DEXTROSE 2-3 GM-% IV SOLR
2.0000 g | Freq: Once | INTRAVENOUS | Status: DC
  Filled 2015-02-09: qty 50

## 2015-02-09 MED ORDER — HYDROXYUREA 100 MG/ML ORAL SUSPENSION
500.0000 mg | Freq: Three times a day (TID) | ORAL | Status: DC
  Administered 2015-02-09 – 2015-02-12 (×9): 500 mg
  Filled 2015-02-09 (×21): qty 5

## 2015-02-09 MED ORDER — LIDOCAINE HCL (CARDIAC) 20 MG/ML IV SOLN
INTRAVENOUS | Status: DC | PRN
  Administered 2015-02-09: 60 mg via INTRAVENOUS

## 2015-02-09 MED ORDER — VITAMIN B-12 1000 MCG PO TABS
1000.0000 ug | ORAL_TABLET | Freq: Every day | ORAL | Status: DC
  Administered 2015-02-10 – 2015-02-12 (×3): 1000 ug
  Filled 2015-02-09 (×3): qty 1

## 2015-02-09 MED ORDER — TRAZODONE HCL 50 MG PO TABS: 50.0000 mg | ORAL_TABLET | Freq: Every evening | ORAL | Status: DC | PRN

## 2015-02-09 NOTE — Care Management Important Message (Signed)
Important Message  Patient Details  Name: Johnny Sanford MRN: [id number] Date of Birth: April 25, 1940   Medicare Important Message Given:  Yes    Juliann Pulse A Ignacio Lowder 02/09/2015, 9:28 AM

## 2015-02-09 NOTE — Transfer of Care (Signed)
Immediate Anesthesia Transfer of Care Note  Patient: Johnny Sanford  Procedure(s) Performed: Procedure(s): PERCUTANEOUS ENDOSCOPIC GASTROSTOMY (PEG) PLACEMENT (N/A)  Patient Location: Endoscopy Unit  Anesthesia Type:General  Level of Consciousness: awake, alert , oriented and patient cooperative  Airway & Oxygen Therapy: Patient Spontanous Breathing and Patient connected to nasal cannula oxygen  Post-op Assessment: Report given to RN, Post -op Vital signs reviewed and stable and Patient moving all extremities X 4  Post vital signs: Reviewed and stable  Last Vitals:  Filed Vitals:   02/09/15 1051 02/09/15 1306  BP: 128/73 131/81  Pulse: 93 91  Temp: 36.9 C 36.6 C  Resp: 18 20    Complications: No apparent anesthesia complications

## 2015-02-09 NOTE — Progress Notes (Signed)
Peg tube placed today by Dr Candace Cruise Patient received prn morphine 2 mg IV x2 for surgical pain with good relief

## 2015-02-09 NOTE — Progress Notes (Signed)
[facility] [place] NAME: Johnny Sanford    MR#:  [id number]  DATE OF BIRTH:  06-08-1940  SUBJECTIVE:  CHIEF COMPLAINT:   Chief Complaint  Patient presents with  . low sodium     low sodium   The patient is 75 year old male with past medical history significant for the. History of SIADH, who presents to the hospital with complaints of low sodium levels, 123. Apparently patient has been drinking plenty of fluids, although was recommended to be on fluid restriction. In the hospital patient was noted to be orthostatically hypotensive and IV fluids were administered. His sodium level has improved from 123-126  initially but then dropped down to 119. Patient's blood pressure drops down to 70s on standing, rehydrated, on fludrocortisone for adrenal insufficiency. Patient was hypoxic while standing yesterday. The angiogram of the chest revealed nodular consolidations in the lungs , likely inflammatory, but no pulmonary embolism He denies any discomfort, patient wants to go home. Physical therapist and recommended home health physical therapy.  Sodium has improved to 123 with fluid restriction and sodium tablets. Aspirated 2 days ago and the [facility] was called for respiratory arrest. Patient's airways were suctioned oxygen applied and he became responsive. Modified barium barium swallowing study was ordered, however, not performed due to broken equipment. Barium swallowing study then was ordered, however, patient aspirated barium. NG tube was placed and tube feeds were initiated. Patient underwent EGD revealing difficult penetrance of scope through. Esophagus suggesting extrinsic compression and normal stomach and duodenum PEG tube was placed by Dr. Candace Cruise.  Patient feels good today  Review of Systems  Unable to perform ROS: acuity of condition    VITAL SIGNS: Blood pressure 132/96, pulse 84, temperature 97 F (36.1 C), temperature source Tympanic, resp. rate  24, height 5\' 4"  (1.626 m), weight 62.097 kg (136 lb 14.4 oz), SpO2 98 %.  PHYSICAL EXAMINATION:   GENERAL:  75 y.o.-year-old patient lying in the bed in mild respiratory  Distress. Dry oral mucosa, hoarseness EYES: Pupils equal, round, reactive to light and accommodation. No scleral icterus. Extraocular muscles intact.  HEENT: Head atraumatic, normocephalic. Oropharynx and nasopharynx clear.  NECK:  Supple, no jugular venous distention. No thyroid enlargement, no tenderness.  LUNGS: Some diminished air entrance on the left, no wheezing,  few rhonchi,,. Intermittently using accessory muscles of respiration, dyspneic, but much less coughing.  CARDIOVASCULAR: S1, S2 normal. No murmurs, rubs, or gallops.  ABDOMEN: Soft, nontender, nondistended. Bowel sounds present. No organomegaly or mass.  EXTREMITIES: No pedal edema, cyanosis, or clubbing.  NEUROLOGIC: Cranial nerves II through XII are intact. Muscle strength 5/5 in all extremities. Sensation intact. Gait not checked.  PSYCHIATRIC: The patient is some alert and oriented.  SKIN: No obvious rash, lesion, or ulcer.   ORDERS/RESULTS REVIEWED:   CBC  Recent Labs Lab 02/02/15 1646 02/03/15 0557 02/04/15 0426 02/06/15 0617  WBC 2.8* 2.4* 4.1 3.2*  HGB 9.6* 10.2* 10.3* 9.7*  HCT 28.2* 28.5* 29.3* 27.9*  PLT 424 392 408 381  MCV 128.9* 128.2* 127.7* 127.8*  MCH 44.2* 45.6* 45.0* 44.4*  MCHC 34.3 35.6 35.2 34.7  RDW 14.7* 14.7* 14.7* 14.8*  LYMPHSABS 0.9*  --   --   --   MONOABS 0.4  --   --   --   EOSABS 0.0  --   --   --   BASOSABS 0.0  --   --   --    ------------------------------------------------------------------------------------------------------------------  Chemistries  Recent Labs Lab 02/03/15 0557  02/04/15 0426 02/05/15 0502 02/06/15 0617 02/07/15 0536 02/07/15 1358 02/08/15 0706  NA 125*  < > 123* 121* 127* 130* 128* 130*  K 3.9  --  3.6 3.7 3.5  --   --  4.5  CL 86*  --  85* 85* 88*  --   --  92*  CO2 31   --  29 29 33*  --   --  33*  GLUCOSE 94  --  120* 98 112*  --   --  101*  BUN 14  --  14 16 19   --   --  13  CREATININE 0.47*  --  0.50* 0.53* 0.61  --   --  0.52*  CALCIUM 8.8*  --  8.9 8.4* 8.8*  --   --  8.6*  AST  --   --  23  --   --   --   --   --   ALT  --   --  13*  --   --   --   --   --   ALKPHOS  --   --  45  --   --   --   --   --   BILITOT  --   --  0.8  --   --   --   --   --   < > = values in this interval not displayed. ------------------------------------------------------------------------------------------------------------------ estimated creatinine clearance is 68.9 mL/min (by C-G formula based on Cr of 0.52). ------------------------------------------------------------------------------------------------------------------ No results for input(s): TSH, T4TOTAL, T3FREE, THYROIDAB in the last 72 hours.  Invalid input(s): FREET3  Cardiac Enzymes No results for input(s): CKMB, TROPONINI, MYOGLOBIN in the last 168 hours.  Invalid input(s): CK ------------------------------------------------------------------------------------------------------------------ Invalid input(s): POCBNP ---------------------------------------------------------------------------------------------------------------  RADIOLOGY: No results found.  EKG:  Orders placed or performed in visit on 03/17/14  . EKG 12-Lead    ASSESSMENT AND PLAN:  Active Problems:   Hyponatremia *acute respiratory failure with hypoxia, aspiration related, repeated chest xray revealed left-sided atelectasis versus pneumonia, continue O2 per nasal cannulas ,  patient is placed nothing by mouth , continue oxygen and wean to room air as tolerated *Aspiration pneumonitis, continue levofloxacin intravenously ,  improvement with therapy *dysphagia,   due to cervical spinal pathology, from during EGD, no esophageal pathology seen, status post recurrent aspirations , now patient is NPO, PEG tube was placed by Dr. Candace Cruise. Today,  January 13TH, 2017. Tube Feedings will be started tomorrow.  Brain  and cervical spine MRI done at [facility] revealed multilevel spondylosis, mostly pronounced at C2-C3 vertebral level , severe spinal canal stenosis. Patient refused a repeated MRI here in this hospital.  Palliative care was consulted to discuss risks of eating after modified barium swallowing study was done today , appreciate input. Patient is DO NOT RESUSCITATE per his request . CT scan of the neck soft tissues didn't show significant pathology *. Hyponatremia, due to  SIADH,  sodium level has been stable at 130 on  low rate IV fluid administration, follow in the morning *. Orthostatic hypotension,  due to adrenal insufficiency,  continue high dose of fludrocortisone.  *. Leukopenia, chronic, stable  *.  anemia, follow , no bleeding noted  *. Generalized weakness with falls, appreciate physical therapist input, patient will be discharged home with home health physical therapy, although I would recommend lifepath, patient is agreeable *Cervical spinal stenosis, patient is to get second opinion from neurosurgery in the next 1-2 weeks.  Management plans discussed with the patient, family and they are in agreement.   DRUG ALLERGIES:  Allergies  Allergen Reactions  . Simvastatin Anxiety    CODE STATUS:     Code Status Orders        Start     Ordered   02/02/15 2132  Full code   Continuous     02/02/15 2131      TOTAL TIME TAKING CARE OF THIS PATIENT . 35 minutes.   Theodoro Grist M.D on 02/09/2015 at 2:09 PM  Between 7am to 6pm - Pager - [phone]  After 6pm go to [email or web address] - password [email or web address] [facility]  Office  [phone]  CC: Primary care physician; Rica Mast, MD

## 2015-02-09 NOTE — Anesthesia Postprocedure Evaluation (Signed)
Anesthesia Post Note  Patient: Johnny Sanford  Procedure(s) Performed: Procedure(s) (LRB): PERCUTANEOUS ENDOSCOPIC GASTROSTOMY (PEG) PLACEMENT (N/A)  Patient location during evaluation: Other Anesthesia Type: General Level of consciousness: awake Pain management: pain level controlled Vital Signs Assessment: post-procedure vital signs reviewed and stable Respiratory status: spontaneous breathing Cardiovascular status: blood pressure returned to baseline Postop Assessment: no headache Anesthetic complications: no    Last Vitals:  Filed Vitals:   02/09/15 1306 02/09/15 1405  BP: 131/81 132/96  Pulse: 91 84  Temp: 36.6 C 36.1 C  Resp: 20 24    Last Pain:  Filed Vitals:   02/09/15 1408  PainSc: Asleep                 Doyel Mulkern M

## 2015-02-09 NOTE — Anesthesia Preprocedure Evaluation (Signed)
Anesthesia Evaluation  Patient identified by MRN, date of birth, ID band Patient awake    Reviewed: Allergy & Precautions, H&P , NPO status , Patient's Chart, lab work & pertinent test results, reviewed documented beta blocker date and time   History of Anesthesia Complications Negative for: history of anesthetic complications  Airway Mallampati: III  TM Distance: >3 FB Neck ROM: full    Dental no notable dental hx. (+) Upper Dentures, Lower Dentures   Pulmonary neg shortness of breath, neg sleep apnea, COPD,  COPD inhaler, neg recent URI, former smoker,    Pulmonary exam normal breath sounds clear to auscultation       Cardiovascular Exercise Tolerance: Good hypertension, (-) angina+ CAD  (-) Past MI, (-) Cardiac Stents and (-) CABG Normal cardiovascular exam(-) dysrhythmias (-) Valvular Problems/Murmurs Rhythm:regular Rate:Normal     Neuro/Psych neg Seizures CVA, No Residual Symptoms negative psych ROS   GI/Hepatic Neg liver ROS, GERD  Medicated,  Endo/Other  neg diabetesHypothyroidism   Renal/GU negative Renal ROS  negative genitourinary   Musculoskeletal   Abdominal   Peds  Hematology negative hematology ROS (+)   Anesthesia Other Findings Past Medical History:   HLD (hyperlipidemia)                                         HTN (hypertension)                                           GERD (gastroesophageal reflux disease)                       Hypothyroidism                                               Cancer (HCC)                                    1991           Comment:sqauamous cell ca   CVA (cerebral vascular accident) ([place])                       DA (degenerative arthritis)                                  RA (rheumatoid arthritis) ([place])                              Adrenal insufficiency ([place])                                  Cervical spinal stenosis                                     Falls  Collagen vascular disease ([place])                              Reproductive/Obstetrics negative OB ROS                             Anesthesia Physical Anesthesia Plan  ASA: III  Anesthesia Plan: General   Post-op Pain Management:    Induction:   Airway Management Planned:   Additional Equipment:   Intra-op Plan:   Post-operative Plan:   Informed Consent: I have reviewed the patients History and Physical, chart, labs and discussed the procedure including the risks, benefits and alternatives for the proposed anesthesia with the patient or authorized representative who has indicated his/her understanding and acceptance.   Dental Advisory Given  Plan Discussed with: Anesthesiologist, CRNA and Surgeon  Anesthesia Plan Comments:         Anesthesia Quick Evaluation

## 2015-02-09 NOTE — Op Note (Signed)
[facility] Gastroenterology Patient Name: Johnny Sanford Procedure Date: 02/09/2015 1:26 PM MRN: [id number] Account #: [id number] Date of Birth: August 03, 1940 Admit Type: Inpatient Age: 75 Room: [facility] ENDO ROOM 4 Gender: Male Note Status: Finalized Procedure:         Upper GI endoscopy Indications:       Dysphagia, High risk for aspiration Providers:         Lupita Dawn. Candace Cruise, MD Referring MD:      Eduard Clos. Gilford Rile, MD (Referring MD) Medicines:         Monitored Anesthesia Care Complications:     No immediate complications. Procedure:         Pre-Anesthesia Assessment:                    - Prior to the procedure, a History and Physical was                     performed, and patient medications, allergies and                     sensitivities were reviewed. The patient's tolerance of                     previous anesthesia was reviewed.                    - The risks and benefits of the procedure and the sedation                     options and risks were discussed with the patient. All                     questions were answered and informed consent was obtained.                    - After reviewing the risks and benefits, the patient was                     deemed in satisfactory condition to undergo the procedure.                    After obtaining informed consent, the endoscope was passed                     under direct vision. Throughout the procedure, the                     patient's blood pressure, pulse, and oxygen saturations                     were monitored continuously. The Olympus GIF-160 endoscope                     (S#. [id number]) was introduced through the mouth, and                     advanced to the second part of duodenum. The upper GI                     endoscopy was performed with difficulty due to extrinsic                     compression. The patient tolerated the procedure well. Findings:      The examined esophagus was  normal. Hard to advance  scope through       esophagus though looked normal. Extrinsic compression?      The entire examined stomach was normal. The patient was placed in the       supine position for PEG placement. The stomach was insufflated to appose       gastric and abdominal walls. A site was located in the body of the       stomach with excellent transillumination for placement. The abdominal       wall was marked and prepped in a sterile manner. The area was       anesthetized with 5 mL of 0.5% lidocaine. The trocar needle was       introduced through the abdominal wall and into the stomach under direct       endoscopic view. A snare was introduced through the endoscope and opened       in the gastric lumen. The guide wire was passed through the trocar and       into the open snare. The snare was closed around the guide wire. The       endoscope and snare were removed, pulling the wire out through the       mouth. A skin incision was made at the site of needle insertion. The       externally removable 20 Fr Bard gastrostomy tube was lubricated. The       G-tube was tied to the guide wire and pulled through the mouth and into       the stomach. The trocar needle was removed, and the gastrostomy tube was       pulled out from the stomach through the skin. The external bumper was       attached to the gastrostomy tube, and the tube was cut to remove the       guide wire. The final position of the gastrostomy tube was confirmed by       relook endoscopy, and skin marking noted to be 2.5 cm at the external       bumper. The final tension and compression of the abdominal wall by the       PEG tube and external bumper were checked and revealed that the bumper       was moderately tight and mildly deforming the skin. The feeding tube was       capped, and the tube site cleaned and dressed. Some bleeding at incision       site.      The examined duodenum was normal. Impression:        - Normal esophagus.                     - Normal stomach.                    - Normal examined duodenum.                    - An externally removable PEG placement was successfully                     completed.                    - No specimens collected. Recommendation:    - Discharge patient to home.                    -  Observe patient's clinical course.                    - The findings and recommendations were discussed with the                     patient.                    - Can use G tube for flushes and meds today. If stable,                     can start TF tomorrow. Procedure Code(s): --- Professional ---                    [phone], Esophagogastroduodenoscopy, flexible, transoral;                     with directed placement of percutaneous gastrostomy tube Diagnosis Code(s): --- Professional ---                    R13.10, Dysphagia, unspecified CPT copyright 2014 American Medical Association. All rights reserved. The codes documented in this report are preliminary and upon coder review may  be revised to meet current compliance requirements. Attending Participation:      I personally performed the entire procedure. Hulen Luster, MD 02/09/2015 2:02:08 PM This report has been signed electronically. Number of Addenda: 0 Note Initiated On: 02/09/2015 1:26 PM      [facility]

## 2015-02-09 NOTE — Progress Notes (Signed)
PT Cancellation Note  Pt currently off floor for procedure (per notes, plan for PEG procedure today) and not available for PT session.  Will re-attempt PT at a later date/time.  Leitha Bleak, [place]

## 2015-02-09 NOTE — Progress Notes (Addendum)
Palliative Care Update  Pt had PEG which was desired by pt.   He is having uncontrolled pain at times and morphine IV prn is ordered.  I talked with his wife. She says he is adamant that he is absolutely NOT going to go to any kind of rehab facility.  However, she says she cannot take care of him at home for now.  She would like me to talk to pt about this.  She wants hydrea capsule changed to liquid (after I informed her this can be done) --and I have ordered this. She would like me to call her after tube feedings get started and go for a while.  I plan to call her tonight ---or possibly over the weekend.    Pt remains DNR.  He will need some rehab time. Even though GI has informed pt and wife that she can do this at home once she is trained, she does not feel capable at this time due to her age, exhaustion, and her own problems.  She needs him to have some time in rehab until he gets stronger and states she absolutely cannot have him home soon.  She says she has no help (son Simona Huh lives 30 min away and works and has a family etc).  No other support apparently.     See full note to follow as well as follow up notes to come.   Colleen Can.

## 2015-02-09 NOTE — Progress Notes (Signed)
Patient returned from Endo. Madlyn Frankel, RN

## 2015-02-09 NOTE — Progress Notes (Signed)
Nutrition Follow-up   INTERVENTION:   EN: Once MD agreeable to initiate tube feeding s/p successful PEG placement today, will recommend initiation of Jevity 1.5 boluses, with a goal rate of 6 cans per day to provide 2133kcals, 90g protein and 1060mL of free water. Will assess additional free water once PEG placed and off IVF (RD notes sodium is slowly improving).  Coordination of Care: will follow bowel regimen as pt has not had BM since 1/9 per documentation   NUTRITION DIAGNOSIS:   Inadequate oral intake related to dysphagia as evidenced by NPO status.  GOAL:   Patient will meet greater than or equal to 90% of their needs  MONITOR:    (Energy intake, Electrolyte and renal profile, Digestive system)  REASON FOR ASSESSMENT:   Consult Enteral/tube feeding initiation and management  ASSESSMENT:    Per MD note: Barium swallowing study ordered, however, patient aspirated barium. NG tube was placed and tube feeds were initiated, now on hold for PEG tube placement tomorrow by Dr. Candace Cruise. Patient is agreeable to continue nothing by mouth in order to heal his lungs for possible cervical spine surgery.   Diet Order:  Diet NPO time specified    Current Nutrition: Pt NPO currently having PEG placed per chart review   Gastrointestinal Profile: Last BM: 02/05/2015   Scheduled Medications:  . [MAR Hold] antiseptic oral rinse  7 mL Mouth Rinse BID  .  ceFAZolin (ANCEF) IV  2 g Intravenous Once  . [MAR Hold] docusate sodium  100 mg Oral BID  . [MAR Hold] escitalopram  20 mg Oral Daily  . [MAR Hold] fludrocortisone  0.1 mg Oral Daily  . [MAR Hold] hydroxyurea  500 mg Oral TID WC  . [MAR Hold] ipratropium-albuterol  3 mL Nebulization Q4H  . [MAR Hold] levofloxacin (LEVAQUIN) IV  750 mg Intravenous Q24H  . [MAR Hold] levothyroxine  137 mcg Oral QAC breakfast  . [MAR Hold] mometasone-formoterol  2 puff Inhalation BID  . [MAR Hold] omega-3 acid ethyl esters  1 g Oral Daily  . [MAR Hold]  pantoprazole sodium  40 mg Per Tube QAC breakfast  . [MAR Hold] sodium chloride  3 mL Intravenous Q12H  . [MAR Hold] vitamin B-12  1,000 mcg Oral Daily    Continuous Medications:  . sodium chloride    . 0.9 % NaCl with KCl 20 mEq / L 30 mL/hr at 02/09/15 0916     Electrolyte/Renal Profile and Glucose Profile:   Recent Labs Lab 02/05/15 0502 02/06/15 0617 02/07/15 0536 02/07/15 1358 02/08/15 0706  NA 121* 127* 130* 128* 130*  K 3.7 3.5  --   --  4.5  CL 85* 88*  --   --  92*  CO2 29 33*  --   --  33*  BUN 16 19  --   --  13  CREATININE 0.53* 0.61  --   --  0.52*  CALCIUM 8.4* 8.8*  --   --  8.6*  GLUCOSE 98 112*  --   --  101*   Protein Profile:  Recent Labs Lab 02/04/15 0426  ALBUMIN 3.5      Weight Trend since Admission: Filed Weights   02/07/15 0500 02/08/15 0500 02/09/15 0500  Weight: 140 lb 12.8 oz (63.866 kg) 140 lb (63.504 kg) 136 lb 14.4 oz (62.097 kg)    BMI:  Body mass index is 23.49 kg/(m^2).  Estimated Nutritional Needs:   Kcal:  BEE 1326 kcals (IF 1.0-1.3, AF 1.3) [id number] kcals/d  Protein:  (1.0-1.2 g/kg) 67-80 g/d   Fluid:  (25-68ml/kg) 1675-2027ml/d  EDUCATION NEEDS:   No education needs identified at this time    [place], RD, LDN Pager [phone] Weekend/On-Call Pager [phone]

## 2015-02-09 NOTE — Op Note (Signed)
EGD normal except difficulty pushing scope through esophagus, suggesting extrinsic compression. G tube successfully placed. Insertion site at 2.5 cm. Can use G tube today for flushes or meds. Some bleeding at incision site. If stable, can start TF tomorrow. If there are concerns, contact GI on call. Thanks.

## 2015-02-09 NOTE — Plan of Care (Signed)
Problem: Fluid Volume: Goal: Ability to maintain a balanced intake and output will improve Outcome: Not Progressing Pt for EGD with PEG placement today. NPO.

## 2015-02-10 LAB — BASIC METABOLIC PANEL
Anion gap: 10 (ref 5–15)
BUN: 12 mg/dL (ref 6–20)
CO2: 26 mmol/L (ref 22–32)
Calcium: 8.4 mg/dL — ABNORMAL LOW (ref 8.9–10.3)
Chloride: 94 mmol/L — ABNORMAL LOW (ref 101–111)
Creatinine, Ser: 0.44 mg/dL — ABNORMAL LOW (ref 0.61–1.24)
GFR calc Af Amer: >60 mL/min (ref >60)
GLUCOSE: 84 mg/dL (ref 65–99)
POTASSIUM: 4.1 mmol/L (ref 3.5–5.1)
Sodium: 130 mmol/L — ABNORMAL LOW (ref 135–145)

## 2015-02-10 LAB — HEMOGLOBIN: Hemoglobin: 9 g/dL — ABNORMAL LOW (ref 13.0–18.0)

## 2015-02-10 MED ORDER — HYDROCODONE-ACETAMINOPHEN 5-325 MG PO TABS: 1.0000 | ORAL_TABLET | ORAL | Status: DC | PRN

## 2015-02-10 MED ORDER — IPRATROPIUM-ALBUTEROL 0.5-2.5 (3) MG/3ML IN SOLN
3.0000 mL | Freq: Four times a day (QID) | RESPIRATORY_TRACT | Status: DC
  Administered 2015-02-10: 20:00:00 3 mL via RESPIRATORY_TRACT
  Filled 2015-02-10: qty 3

## 2015-02-10 MED ORDER — JEVITY 1.5 CAL/FIBER PO LIQD
120.0000 mL | Freq: Four times a day (QID) | ORAL | Status: DC
  Administered 2015-02-10 (×2): 120 mL
  Administered 2015-02-10: 12:00:00
  Administered 2015-02-11: 120 mL

## 2015-02-10 NOTE — Progress Notes (Signed)
[facility] [place] NAME: Johnny Sanford    MR#:  [id number]  DATE OF BIRTH:  1940/07/16  SUBJECTIVE:  CHIEF COMPLAINT:   Chief Complaint  Patient presents with  . low sodium     low sodium   The patient is 75 year old male with past medical history significant for the. History of SIADH, who presents to the hospital with complaints of low sodium levels, 123. Apparently patient has been drinking plenty of fluids, although was recommended to be on fluid restriction. In the hospital patient was noted to be orthostatically hypotensive and IV fluids were administered. His sodium level has improved from 123-126  initially but then dropped down to 119. Patient's blood pressure drops down to 70s on standing, rehydrated, on fludrocortisone for adrenal insufficiency. Patient was hypoxic while standing yesterday. The angiogram of the chest revealed nodular consolidations in the lungs , likely inflammatory, but no pulmonary embolism He denies any discomfort, patient wants to go home. Physical therapist and recommended home health physical therapy.  Sodium has improved to 123 with fluid restriction and sodium tablets. Aspirated 2 days ago and the CODE BLUE was called for respiratory arrest. Patient's airways were suctioned oxygen applied and he became responsive. Modified barium barium swallowing study was ordered, however, not performed due to broken equipment. Barium swallowing study then was ordered, however, patient aspirated barium. NG tube was placed and tube feeds were initiated. Patient underwent EGD revealing difficult penetrance of scope through. Esophagus suggesting extrinsic compression and normal stomach and duodenum PEG tube was placed by Dr. Candace Cruise.  Patient feels good today, some soreness, wants food per G tube, dietary is consulted, tube feeds initiated. Denies problems  Review of Systems  Unable to perform ROS: acuity of condition    VITAL SIGNS:  Blood pressure 115/85, pulse 87, temperature 97.9 F (36.6 C), temperature source Oral, resp. rate 20, height 5\' 4"  (1.626 m), weight 61.78 kg (136 lb 3.2 oz), SpO2 95 %.  PHYSICAL EXAMINATION:   GENERAL:  75 y.o.-year-old patient lying in the bed in mild respiratory  Distress. Dry oral mucosa, hoarseness EYES: Pupils equal, round, reactive to light and accommodation. No scleral icterus. Extraocular muscles intact.  HEENT: Head atraumatic, normocephalic. Oropharynx and nasopharynx clear.  NECK:  Supple, no jugular venous distention. No thyroid enlargement, no tenderness.  LUNGS: Some diminished air entrance on the left, no wheezing,  Diffuse rales and crepitatrons, rhonchi, bilaterally, more on the Left,. Intermittently using accessory muscles of respiration,  Some dry cough with rattling in the chest. Off oxygen , on RA today.  CARDIOVASCULAR: S1, S2 normal. No murmurs, rubs, or gallops.  ABDOMEN: Soft, nontender, nondistended. Bowel sounds present. No organomegaly or mass. G tube with entrance in L upper abdomen noted EXTREMITIES: No pedal edema, cyanosis, or clubbing.  NEUROLOGIC: Cranial nerves II through XII are intact. Muscle strength 5/5 in all extremities. Sensation intact. Gait not checked.  PSYCHIATRIC: The patient is some alert and oriented.  SKIN: No obvious rash, lesion, or ulcer.   ORDERS/RESULTS REVIEWED:   CBC  Recent Labs Lab 02/04/15 0426 02/06/15 0617  WBC 4.1 3.2*  HGB 10.3* 9.7*  HCT 29.3* 27.9*  PLT 408 381  MCV 127.7* 127.8*  MCH 45.0* 44.4*  MCHC 35.2 34.7  RDW 14.7* 14.8*   ------------------------------------------------------------------------------------------------------------------  Chemistries   Recent Labs Lab 02/04/15 0426 02/05/15 0502 02/06/15 0617 02/07/15 0536 02/07/15 1358 02/08/15 0706 02/10/15 0611  NA 123* 121* 127* 130* 128* 130* 130*  K 3.6 3.7 3.5  --   --  4.5 4.1  CL 85* 85* 88*  --   --  92* 94*  CO2 29 29 33*  --   --   33* 26  GLUCOSE 120* 98 112*  --   --  101* 84  BUN 14 16 19   --   --  13 12  CREATININE 0.50* 0.53* 0.61  --   --  0.52* 0.44*  CALCIUM 8.9 8.4* 8.8*  --   --  8.6* 8.4*  AST 23  --   --   --   --   --   --   ALT 13*  --   --   --   --   --   --   ALKPHOS 45  --   --   --   --   --   --   BILITOT 0.8  --   --   --   --   --   --    ------------------------------------------------------------------------------------------------------------------ estimated creatinine clearance is 68.9 mL/min (by C-G formula based on Cr of 0.44). ------------------------------------------------------------------------------------------------------------------ No results for input(s): TSH, T4TOTAL, T3FREE, THYROIDAB in the last 72 hours.  Invalid input(s): FREET3  Cardiac Enzymes No results for input(s): CKMB, TROPONINI, MYOGLOBIN in the last 168 hours.  Invalid input(s): CK ------------------------------------------------------------------------------------------------------------------ Invalid input(s): POCBNP ---------------------------------------------------------------------------------------------------------------  RADIOLOGY: No results found.  EKG:  Orders placed or performed in visit on 03/17/14  . EKG 12-Lead    ASSESSMENT AND PLAN:  Active Problems:   Hyponatremia *acute respiratory failure with hypoxia, aspiration related, repeated chest xray revealed left-sided  pneumonia, continue O2 as needed ,  patient is nothing by mouth , now weaned off  to room air , follow closely *Aspiration pneumonitis, continue levofloxacin intravenously ,  Worsening rales today, follow in am, ? Aspiration during procedure *dysphagia,   due to cervical spinal pathology, according EGD, no esophageal pathology seen, status post recurrent aspirations , now patient is NPO, PEG tube was placed by Dr. Candace Cruise.  January 13TH, 2017. Tube Feedings started 02/10/15.  Brain  and cervical spine MRI done at [facility] revealed  multilevel spondylosis, mostly pronounced at C2-C3 vertebral level , severe spinal canal stenosis. Patient refused a repeated MRI here in this hospital.  Palliative care was consulted to discuss risks of eating Patient is DO NOT RESUSCITATE per his request . CT scan of the neck soft tissues didn't show significant pathology *. Hyponatremia, due to  SIADH,  sodium level has been stable at 130 on  low rate IV fluid administration, follow in the morning *. Orthostatic hypotension,  due to adrenal insufficiency,  continue high dose of fludrocortisone.  *. Leukopenia, chronic, stable , follow in am *.  anemia, follow , no bleeding noted  *. Generalized weakness with falls, appreciate physical therapist input, patient will be discharged home likely on Monday provided tolerates tube feeds well,   with home health physical therapy, patient is agreeable *Cervical spinal stenosis, patient is to get second opinion from neurosurgery in the next 1-2 weeks.  Management plans discussed with the patient, family and they are in agreement.   DRUG ALLERGIES:  Allergies  Allergen Reactions  . Simvastatin Anxiety    CODE STATUS:     Code Status Orders        Start     Ordered   02/02/15 2132  Full code   Continuous     02/02/15 2131      TOTAL TIME  TAKING CARE OF THIS PATIENT . 35 minutes.   Theodoro Grist M.D on 02/10/2015 at 1:27 PM  Between 7am to 6pm - Pager - [phone]  After 6pm go to [email or web address] - password [email or web address] [facility]  Office  [phone]  CC: Primary care physician; Rica Mast, MD

## 2015-02-10 NOTE — Plan of Care (Signed)
Safety:  Pt remained safe during shift.  Got up to chair w/one assist for most of morning.  Pt not impulsive and calls for help when he is in need.  Pt's room is near nurse's stn and hourly rounding was performed. Pain Management:  Pt had no c/o of pain during shift. Medication available if needed. Fluid Volume: Pt was started on tube feeds 4x/day w/30 ml free water before and after.  Pt tolerated first feed well.  Continues on IVF.  Na level stable at 130.

## 2015-02-10 NOTE — Progress Notes (Signed)
Nutrition Follow-up       INTERVENTION:  EN: Spoke with Dr. Clayton Bibles this am and wanting to start tube feeding today.  Recommend jevity 1.5 bolus feeding of 1 1/2 cans 4 times per day.  Recommend starting at 1/2 can today with increase as tolerated. Free water flush of 60ml before and after feeding (additional 225ml per day) secondary to low Na level.  Free water flush will need to be monitored with Na level.     NUTRITION DIAGNOSIS:   Inadequate oral intake related to dysphagia as evidenced by NPO status.    GOAL:   Patient will meet greater than or equal to 90% of their needs    MONITOR:    (Energy intake, Electrolyte and renal profile, Digestive system)  REASON FOR ASSESSMENT:   Consult Enteral/tube feeding initiation and management  ASSESSMENT:      Pt s/p PEG placement. Wife not present in room during rounds this am    Current Nutrition: NPO   Gastrointestinal Profile: Last BM: 1/10   Scheduled Medications:  . escitalopram  20 mg Per Tube Daily  . feeding supplement (JEVITY 1.5 CAL/FIBER)  120 mL Per Tube QID  . fludrocortisone  0.1 mg Per Tube Daily  . hydroxyurea  500 mg Per Tube 3 times per day  . ipratropium-albuterol  3 mL Nebulization Q4H  . levofloxacin (LEVAQUIN) IV  750 mg Intravenous Q24H  . levothyroxine  137 mcg Per Tube QAC breakfast  . mometasone-formoterol  2 puff Inhalation BID  . pantoprazole sodium  40 mg Per Tube QAC breakfast  . sodium chloride  3 mL Intravenous Q12H  . vitamin B-12  1,000 mcg Per Tube Daily    Continuous Medications:  . 0.9 % NaCl with KCl 20 mEq / L 30 mL/hr at 02/09/15 2158     Electrolyte/Renal Profile and Glucose Profile:   Recent Labs Lab 02/06/15 0617  02/07/15 1358 02/08/15 0706 02/10/15 0611  NA 127*  < > 128* 130* 130*  K 3.5  --   --  4.5 4.1  CL 88*  --   --  92* 94*  CO2 33*  --   --  33* 26  BUN 19  --   --  13 12  CREATININE 0.61  --   --  0.52* 0.44*  CALCIUM 8.8*  --   --  8.6* 8.4*   GLUCOSE 112*  --   --  101* 84  < > = values in this interval not displayed. Protein Profile:  Recent Labs Lab 02/04/15 0426  ALBUMIN 3.5      Weight Trend since Admission: Filed Weights   02/08/15 0500 02/09/15 0500 02/10/15 0501  Weight: 140 lb (63.504 kg) 136 lb 14.4 oz (62.097 kg) 136 lb 3.2 oz (61.78 kg)      Diet Order:  Diet NPO time specified  Skin:   reviewed   Height:   Ht Readings from Last 1 Encounters:  02/02/15 5\' 4"  (1.626 m)    Weight:   Wt Readings from Last 1 Encounters:  02/10/15 136 lb 3.2 oz (61.78 kg)    Ideal Body Weight:     BMI:  Body mass index is 23.37 kg/(m^2).  Estimated Nutritional Needs:   Kcal:  BEE 1326 kcals (IF 1.0-1.3, AF 1.3) [id number] kcals/d  Protein:  (1.0-1.2 g/kg) 67-80 g/d   Fluid:  (25-55ml/kg) 1675-2052ml/d  EDUCATION NEEDS:   No education needs identified at this time  HIGH Care Level  Delsie Amador B. Zenia Resides,  RD, LDN [phone] (pager) Weekend/On-Call pager [phone])

## 2015-02-10 NOTE — Plan of Care (Signed)
Problem: Safety: Goal: Ability to remain free from injury will improve Outcome: Progressing Pt is a high fall risk. Encouraged to call for assistance when needed.  Problem: Pain Managment: Goal: General experience of comfort will improve Outcome: Progressing Pt c/o abdominal pain, morphine IV given with relief.   Problem: Fluid Volume: Goal: Ability to maintain a balanced intake and output will improve Outcome: Progressing Pt continues to receive NS with potassium. Uses mouth swabs to moisten mouth. Pt remains NPO.  PEG tube flushed, checked for correct placement. Night time medication given through PEG tube, patient tolerated well.

## 2015-02-11 LAB — CBC
HEMATOCRIT: 26 % — AB (ref 40.0–52.0)
Hemoglobin: 8.8 g/dL — ABNORMAL LOW (ref 13.0–18.0)
MCH: 43.4 pg — AB (ref 26.0–34.0)
MCHC: 34 g/dL (ref 32.0–36.0)
MCV: 127.8 fL — AB (ref 80.0–100.0)
Platelets: 337 10*3/uL (ref 150–440)
RBC: 2.03 MIL/uL — ABNORMAL LOW (ref 4.40–5.90)
RDW: 14.4 % (ref 11.5–14.5)
WBC: 2.8 10*3/uL — AB (ref 3.8–10.6)

## 2015-02-11 LAB — SODIUM: Sodium: 128 mmol/L — ABNORMAL LOW (ref 135–145)

## 2015-02-11 MED ORDER — LEVOFLOXACIN 500 MG PO TABS
500.0000 mg | ORAL_TABLET | Freq: Every day | ORAL | Status: DC
  Administered 2015-02-12: 500 mg via ORAL
  Filled 2015-02-11: qty 1

## 2015-02-11 MED ORDER — LEVOFLOXACIN 500 MG PO TABS: 500.0000 mg | ORAL_TABLET | Freq: Every day | ORAL | Status: DC

## 2015-02-11 MED ORDER — BISACODYL 10 MG RE SUPP
10.0000 mg | Freq: Once | RECTAL | Status: AC
  Administered 2015-02-11: 22:00:00 10 mg via RECTAL
  Filled 2015-02-11: qty 1

## 2015-02-11 MED ORDER — JEVITY 1.5 CAL/FIBER PO LIQD
237.0000 mL | Freq: Four times a day (QID) | ORAL | Status: DC
  Administered 2015-02-11 – 2015-02-12 (×4): 237 mL

## 2015-02-11 MED ORDER — IPRATROPIUM-ALBUTEROL 0.5-2.5 (3) MG/3ML IN SOLN
3.0000 mL | Freq: Four times a day (QID) | RESPIRATORY_TRACT | Status: DC | PRN
  Administered 2015-02-11: 04:00:00 3 mL via RESPIRATORY_TRACT
  Filled 2015-02-11: qty 3

## 2015-02-11 MED ORDER — IPRATROPIUM-ALBUTEROL 0.5-2.5 (3) MG/3ML IN SOLN
3.0000 mL | Freq: Three times a day (TID) | RESPIRATORY_TRACT | Status: DC
  Administered 2015-02-11 (×2): 3 mL via RESPIRATORY_TRACT
  Filled 2015-02-11 (×4): qty 3

## 2015-02-11 NOTE — Progress Notes (Signed)
Nutrition Follow-up     INTERVENTION:  EN: Recommend increasing tube feeding of jevity 1.5 to full can at this time to better meet nutritional needs.  Continue flush of 57ml before and after feeding.  Monitoring Na level.    NUTRITION DIAGNOSIS:   Inadequate oral intake related to dysphagia as evidenced by NPO status.     GOAL:   Patient will meet greater than or equal to 90% of their needs    MONITOR:    (Energy intake, Electrolyte and renal profile, Digestive system)  REASON FOR ASSESSMENT:   Consult Enteral/tube feeding initiation and management  ASSESSMENT:       Current Nutrition: Pt tolerating 180ml of feeding (jevity 1.5)   Gastrointestinal Profile: Last BM: 1/10   Scheduled Medications:  . escitalopram  20 mg Per Tube Daily  . feeding supplement (JEVITY 1.5 CAL/FIBER)  237 mL Per Tube QID  . fludrocortisone  0.1 mg Per Tube Daily  . hydroxyurea  500 mg Per Tube 3 times per day  . ipratropium-albuterol  3 mL Nebulization TID  . levofloxacin (LEVAQUIN) IV  750 mg Intravenous Q24H  . levothyroxine  137 mcg Per Tube QAC breakfast  . mometasone-formoterol  2 puff Inhalation BID  . pantoprazole sodium  40 mg Per Tube QAC breakfast  . sodium chloride  3 mL Intravenous Q12H  . vitamin B-12  1,000 mcg Per Tube Daily    Continuous Medications:  . 0.9 % NaCl with KCl 20 mEq / L 30 mL/hr at 02/10/15 1108     Electrolyte/Renal Profile and Glucose Profile: reviewed    Weight Trend since Admission: Filed Weights   02/09/15 0500 02/10/15 0501 02/11/15 0519  Weight: 136 lb 14.4 oz (62.097 kg) 136 lb 3.2 oz (61.78 kg) 135 lb 11.2 oz (61.553 kg)      Diet Order:  Diet NPO time specified  Skin:   reviewed  Last BM:   reviewed  Height:   Ht Readings from Last 1 Encounters:  02/02/15 5\' 4"  (1.626 m)    Weight:   Wt Readings from Last 1 Encounters:  02/11/15 135 lb 11.2 oz (61.553 kg)    Ideal Body Weight:     BMI:  Body mass index is  23.28 kg/(m^2).  Estimated Nutritional Needs:   Kcal:  BEE 1326 kcals (IF 1.0-1.3, AF 1.3) [id number] kcals/d  Protein:  (1.0-1.2 g/kg) 67-80 g/d   Fluid:  (25-2ml/kg) 1675-2039ml/d  EDUCATION NEEDS:   No education needs identified at this time HIGH Care Level  Johnny Sanford B. Zenia Resides, [place], [place] (pager) Weekend/On-Call pager [phone])

## 2015-02-11 NOTE — Plan of Care (Signed)
Problem: Safety: Goal: Ability to remain free from injury will improve Outcome: Progressing Pt is alert and oriented, standby assist. Has walked around nurses station with walker a couple of times. High Fall Risk, encouraged to call for assistance.  Problem: Pain Managment: Goal: General experience of comfort will improve Outcome: Progressing Pt has no c/o of pain. States his abdomen is sore at times.  Problem: Fluid Volume: Goal: Ability to maintain a balanced intake and output will improve Outcome: Progressing Patient is tolerating feeding, 120 ml of Jevity to gravity. States he has a desire to have more feeding. Pt has rhonchi, worsening with a rattle in the throat. MD notified. Pt is NPO, only receiving NS with potassium at 30 ml/hr. No new orders, continue with breathing treatments and flutter valve.

## 2015-02-11 NOTE — Progress Notes (Signed)
Physical Therapy Treatment Patient Details Name: Johnny Sanford MRN: [id number] DOB: 03/03/1940 Today's Date: 02/11/2015    History of Present Illness Pt here with hyponatremia, has been having some falls and confusion recently.  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    PT Comments    Patient is a pleasant, motivated male who demonstrates excitement for PT session. Patient's resting oxygen saturations when supine in bed ~96%. After performing 5x STS in 15 sec., and standing balance exercises, patient's resting oxygen saturations dropped to 84%. Returned to 90s% after 1 minute of rest. Per nursing, patient has not been wearing oxygen during ambulation. Demonstrates increased tolerance of activity; however, oxygen saturations dropped to 74% and 84%, respectively after each 180' lap (see gait section). Patient educated about importance of slowing gait for safety to prevent falls. Patient is progressing towards goals but continues to require cues to rest, as he does not experience symptoms with associated drop in oxygen saturations.   Follow Up Recommendations  Home health PT     Equipment Recommendations  Rolling walker with 5" wheels    Recommendations for Other Services       Precautions / Restrictions Precautions Precautions: Fall Precaution Comments: Monitor oxygen saturations Restrictions Weight Bearing Restrictions: No    Mobility  Bed Mobility Overal bed mobility: Modified Independent             General bed mobility comments: Patient able to use UE/LEs to bridge, push, and pull. Patient utilized bed rails to scoot to [facility].  Transfers Overall transfer level: Needs assistance Equipment used: Rolling walker (2 wheeled) Transfers: Sit to/from Stand Sit to Stand: Min guard         General transfer comment: Patient required verbal cues for proper hand placement.  Ambulation/Gait Ambulation/Gait assistance: Min guard Ambulation  Distance (Feet): 430 Feet Assistive device: Rolling walker (2 wheeled)       General Gait Details: Patient ambulates at increased cadence, having tendency to push RW too far in front of him. Verbal cues used for safety awareness and tactile cues used to slow patient's speed. Patient utilized seated rest breaks after each lap around nurse station. During first rest break, oxygen saturations had dropped to 74% and returned to mid-90s% within 1 min. After second lap where patient was encouraged to slow speed and take slow/deep breaths, oxygen saturations dropped to 84%, returning to mid-90s% within one minute. Patient denied dizziness.   Stairs            Wheelchair Mobility    Modified Rankin (Stroke Patients Only)       Balance Overall balance assessment: Modified Independent Sitting-balance support: Feet supported Sitting balance-Leahy Scale: Good     Standing balance support: Bilateral upper extremity supported Standing balance-Leahy Scale: Good                      Cognition Arousal/Alertness: Awake/alert Behavior During Therapy: WFL for tasks assessed/performed;Impulsive Overall Cognitive Status: Within Functional Limits for tasks assessed                      Exercises General Exercises - Lower Extremity Ankle Circles/Pumps: AROM;Seated;15 reps Long Arc Quad: AROM;15 reps;Seated Hip Flexion/Marching: AROM;Seated;15 reps Heel Raises: AROM;10 reps;Standing Other Exercises Other Exercises: Weightshifting in standing x10    General Comments        Pertinent Vitals/Pain Pain Assessment: No/denies pain    Home Living  Prior Function            PT Goals (current goals can now be found in the care plan section) Acute Rehab PT Goals Patient Stated Goal: To go home PT Goal Formulation: With patient Time For Goal Achievement: 02/17/15 Potential to Achieve Goals: Good Progress towards PT goals: Progressing toward  goals    Frequency  Min 2X/week    PT Plan Current plan remains appropriate    Co-evaluation             End of Session Equipment Utilized During Treatment: Gait belt Activity Tolerance: Patient tolerated treatment well Patient left: in bed;with call bell/phone within reach;with bed alarm set     Time: [id number] PT Time Calculation (min) (ACUTE ONLY): 24 min  Charges:  $Gait Training: 8-22 mins $Therapeutic Exercise: 8-22 mins                    G Codes:      Dorice Lamas, PT, DPT 02/11/2015, 9:47 AM

## 2015-02-11 NOTE — Progress Notes (Signed)
[facility] [place] NAME: Johnny Sanford    MR#:  [id number]  DATE OF BIRTH:  1940-03-29  SUBJECTIVE:  CHIEF COMPLAINT:   Chief Complaint  Patient presents with  . low sodium     low sodium   The patient is 75 year old male with past medical history significant for the. History of SIADH, who presents to the hospital with complaints of low sodium levels, 123. Apparently patient has been drinking plenty of fluids, although was recommended to be on fluid restriction. In the hospital patient was noted to be orthostatically hypotensive and IV fluids were administered. His sodium level has improved from 123-126  initially but then dropped down to 119. Patient's blood pressure drops down to 70s on standing, rehydrated, on fludrocortisone for adrenal insufficiency. Patient was hypoxic while standing yesterday. The angiogram of the chest revealed nodular consolidations in the lungs , likely inflammatory, but no pulmonary embolism He denies any discomfort, patient wants to go home. Physical therapist and recommended home health physical therapy.  Sodium has improved to 123 with fluid restriction and sodium tablets. Aspirated 2 days ago and the CODE BLUE was called for respiratory arrest. Patient's airways were suctioned oxygen applied and he became responsive. Modified barium barium swallowing study was ordered, however, not performed due to broken equipment. Barium swallowing study then was ordered, however, patient aspirated barium. NG tube was placed and tube feeds were initiated. Patient underwent EGD revealing difficult penetrance of scope through. Esophagus suggesting extrinsic compression and normal stomach and duodenum PEG tube was placed by Dr. Candace Cruise.  Patient feels good today, some soreness, is receiving bolus G tube feeds, dietary consult is appreciated. Wants to go home. Did not have bowel movement yet since is on opiates for pain control, Colace, senna,  as well as an enema is ordered    Review of Systems  Unable to perform ROS: acuity of condition    VITAL SIGNS: Blood pressure 107/69, pulse 89, temperature 98.7 F (37.1 C), temperature source Oral, resp. rate 20, height 5\' 4"  (1.626 m), weight 61.553 kg (135 lb 11.2 oz), SpO2 94 %.  PHYSICAL EXAMINATION:   GENERAL:  75 y.o.-year-old patient lying in the bed in mild respiratory  Distress. Dry oral mucosa, hoarseness EYES: Pupils equal, round, reactive to light and accommodation. No scleral icterus. Extraocular muscles intact.  HEENT: Head atraumatic, normocephalic. Oropharynx and nasopharynx clear.  NECK:  Supple, no jugular venous distention. No thyroid enlargement, no tenderness.  LUNGS: Better air entrance on the left, no wheezing,  scattered rales and crepitatrons, rhonchi, bilaterally, more on the Left,. Not using accessory muscles of respiration,  Some dry cough with rattling in the chest. Off oxygen , on RA today.  CARDIOVASCULAR: S1, S2 normal. No murmurs, rubs, or gallops.  ABDOMEN: Soft, nontender, nondistended. Bowel sounds present. No organomegaly or mass. G tube with entrance in L upper abdomen noted EXTREMITIES: No pedal edema, cyanosis, or clubbing.  NEUROLOGIC: Cranial nerves II through XII are intact. Muscle strength 5/5 in all extremities. Sensation intact. Gait not checked.  PSYCHIATRIC: The patient is some alert and oriented.  SKIN: No obvious rash, lesion, or ulcer.   ORDERS/RESULTS REVIEWED:   CBC  Recent Labs Lab 02/06/15 0617 02/10/15 0611 02/11/15 0901  WBC 3.2*  --  2.8*  HGB 9.7* 9.0* 8.8*  HCT 27.9*  --  26.0*  PLT 381  --  337  MCV 127.8*  --  127.8*  MCH 44.4*  --  43.4*  MCHC 34.7  --  34.0  RDW 14.8*  --  14.4   ------------------------------------------------------------------------------------------------------------------  Chemistries   Recent Labs Lab 02/05/15 0502 02/06/15 0617 02/07/15 0536 02/07/15 1358 02/08/15 0706  02/10/15 0611 02/11/15 0901  NA 121* 127* 130* 128* 130* 130* 128*  K 3.7 3.5  --   --  4.5 4.1  --   CL 85* 88*  --   --  92* 94*  --   CO2 29 33*  --   --  33* 26  --   GLUCOSE 98 112*  --   --  101* 84  --   BUN 16 19  --   --  13 12  --   CREATININE 0.53* 0.61  --   --  0.52* 0.44*  --   CALCIUM 8.4* 8.8*  --   --  8.6* 8.4*  --    ------------------------------------------------------------------------------------------------------------------ estimated creatinine clearance is 68.9 mL/min (by C-G formula based on Cr of 0.44). ------------------------------------------------------------------------------------------------------------------ No results for input(s): TSH, T4TOTAL, T3FREE, THYROIDAB in the last 72 hours.  Invalid input(s): FREET3  Cardiac Enzymes No results for input(s): CKMB, TROPONINI, MYOGLOBIN in the last 168 hours.  Invalid input(s): CK ------------------------------------------------------------------------------------------------------------------ Invalid input(s): POCBNP ---------------------------------------------------------------------------------------------------------------  RADIOLOGY: No results found.  EKG:  Orders placed or performed in visit on 03/17/14  . EKG 12-Lead    ASSESSMENT AND PLAN:  Active Problems:   Hyponatremia *acute respiratory failure with hypoxia, aspiration related, repeated chest xray revealed left-sided  pneumonia, continue O2 as needed ,  patient is nothing by mouth , now weaned off  to room air , following closely *Aspiration pneumonitis, continue levofloxacin , change per G-tube,  better today. Clinically *dysphagia,   due to cervical spinal pathology, according to EGD, no esophageal pathology seen, status post recurrent aspirations , now patient is NPO, PEG tube was placed by Dr. Candace Cruise.  January 13TH, 2017. Tube Feedings started 02/10/15.  Brain  and cervical spine MRI done at [facility] revealed multilevel spondylosis, mostly  pronounced at C2-C3 vertebral level , severe spinal canal stenosis. Patient refused a repeated MRI here in this hospital.  Palliative care was consulted to discuss risks of eating Patient is DO NOT RESUSCITATE per his request . CT scan of the neck soft tissues didn't show significant pathology *. Hyponatremia, due to  SIADH,  sodium level has been stable at 130 on  low rate IV fluid administration, now 128. Discontinue IV fluids completely , follow sodium level in the morning *. Orthostatic hypotension,  due to adrenal insufficiency,  continue high dose of fludrocortisone.  *. Leukopenia, chronic, seems to be worse then 02/05/15, follow intermittently *.  anemia, stable, no bleeding noted  *. Generalized weakness with falls, appreciate physical therapist input, patient will be discharged home likely on Monday provided tolerates tube feeds well,   with home health physical therapy, patient is agreeable, she does live according to report has some concerns, we'll need to discuss this with her and teach how to take care of of PEG tube *Cervical spinal stenosis, patient is to get second opinion from neurosurgery in the next 1-2 weeks.  Management plans discussed with the patient, family and they are in agreement.   DRUG ALLERGIES:  Allergies  Allergen Reactions  . Simvastatin Anxiety    CODE STATUS:     Code Status Orders        Start     Ordered   02/02/15 2132  Full code   Continuous  02/02/15 2131      TOTAL TIME TAKING CARE OF THIS PATIENT . 35 minutes.   Theodoro Grist M.D on 02/11/2015 at 1:16 PM  Between 7am to 6pm - Pager - [phone]  After 6pm go to [email or web address] - password [email or web address]  Office  [phone]  CC: Primary care physician; Rica Mast, MD

## 2015-02-12 DIAGNOSIS — J441 Chronic obstructive pulmonary disease with (acute) exacerbation: Secondary | ICD-10-CM

## 2015-02-12 DIAGNOSIS — R531 Weakness: Secondary | ICD-10-CM

## 2015-02-12 DIAGNOSIS — Z931 Gastrostomy status: Secondary | ICD-10-CM

## 2015-02-12 DIAGNOSIS — I951 Orthostatic hypotension: Secondary | ICD-10-CM

## 2015-02-12 DIAGNOSIS — D649 Anemia, unspecified: Secondary | ICD-10-CM

## 2015-02-12 DIAGNOSIS — J9601 Acute respiratory failure with hypoxia: Secondary | ICD-10-CM

## 2015-02-12 DIAGNOSIS — J690 Pneumonitis due to inhalation of food and vomit: Secondary | ICD-10-CM

## 2015-02-12 DIAGNOSIS — D72819 Decreased white blood cell count, unspecified: Secondary | ICD-10-CM

## 2015-02-12 LAB — SODIUM: SODIUM: 131 mmol/L — AB (ref 135–145)

## 2015-02-12 MED ORDER — LEVOFLOXACIN 500 MG PO TABS: 500.0000 mg | ORAL_TABLET | Freq: Every day | ORAL | Status: DC

## 2015-02-12 MED ORDER — HYDROXYUREA 100 MG/ML ORAL SUSPENSION: 500.0000 mg | Freq: Three times a day (TID) | ORAL | Status: DC

## 2015-02-12 MED ORDER — JEVITY 1.5 CAL/FIBER PO LIQD
360.0000 mL | Freq: Four times a day (QID) | ORAL | Status: DC
Start: 2015-02-12 — End: 2015-02-12
  Administered 2015-02-12: 12:00:00 360 mL

## 2015-02-12 MED ORDER — HYDROXYUREA 500 MG PO CAPS: 500.0000 mg | ORAL_CAPSULE | Freq: Three times a day (TID) | ORAL | Status: DC

## 2015-02-12 MED ORDER — JEVITY 1.5 CAL/FIBER PO LIQD: 360.0000 mL | Freq: Four times a day (QID) | ORAL | Status: DC

## 2015-02-12 MED ORDER — PANTOPRAZOLE SODIUM 40 MG PO PACK: 40.0000 mg | PACK | Freq: Every day | ORAL | Status: DC

## 2015-02-12 MED ORDER — HYDROCODONE-ACETAMINOPHEN 5-325 MG PO TABS: 1.0000 | ORAL_TABLET | ORAL | Status: DC | PRN

## 2015-02-12 MED ORDER — RANITIDINE HCL 150 MG PO TABS: 150.0000 mg | ORAL_TABLET | Freq: Two times a day (BID) | ORAL | Status: DC

## 2015-02-12 MED ORDER — IPRATROPIUM-ALBUTEROL 0.5-2.5 (3) MG/3ML IN SOLN: 3.0000 mL | RESPIRATORY_TRACT | Status: DC | PRN

## 2015-02-12 NOTE — Plan of Care (Signed)
Problem: Safety: Goal: Ability to remain free from injury will improve Outcome: Progressing Pt is alert and oriented. Pt is a high fall risk, hx of falls. Pt is a standby assist with rolling walker. Walked in the halls in the evening.  Problem: Pain Managment: Goal: General experience of comfort will improve Outcome: Progressing No complaints of pain at this time. Continue to assess.  Problem: Fluid Volume: Goal: Ability to maintain a balanced intake and output will improve Outcome: Progressing Pt is tolerating 237 ml of Jevity feedings.  Biscadyl suppository ordered for a BM. Pt had a BM in the evening.

## 2015-02-12 NOTE — Discharge Summary (Signed)
[facility] [place] NAME: Johnny Sanford    MR#:  [id number]  DATE OF BIRTH:  [birth year]  DATE OF ADMISSION:  02/02/2015 ADMITTING PHYSICIAN: Gladstone Lighter, MD  DATE OF DISCHARGE: No discharge date for patient encounter.  PRIMARY CARE PHYSICIAN: Rica Mast, MD     ADMISSION DIAGNOSIS:  Adrenal insufficiency ([place]) [E27.40] Hyponatremia [E87.1] Weakness generalized [R53.1]  DISCHARGE DIAGNOSIS:  Principal Problem:   Hyponatremia Active Problems:   Acute respiratory failure with hypoxia (HCC)   Aspiration pneumonitis (HCC)   COPD exacerbation (HCC)   S/P percutaneous endoscopic gastrostomy (PEG) tube placement (HCC)   Orthostatic hypotension   Leukopenia   Anemia   Generalized weakness   SECONDARY DIAGNOSIS:   Past Medical History  Diagnosis Date  . HLD (hyperlipidemia)   . HTN (hypertension)   . GERD (gastroesophageal reflux disease)   . Hypothyroidism   . 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 CVA (cerebral vascular accident) ([place])   . DA (degenerative arthritis)   . RA (rheumatoid arthritis) ([place])   . Adrenal insufficiency ([place])   . Cervical spinal stenosis   . Falls   . Collagen vascular disease ([place])     .pro HOSPITAL COURSE:   The patient is 75 year old male with past medical history significant for the History of SIADH, who presents to the hospital with complaints of low sodium levels, 123. Apparently patient has been drinking plenty of fluids, although was recommended to be on fluid restriction. In the hospital patient was noted to be orthostatically hypotensive and IV fluids were administered. His sodium level has improved from 123-126 initially but then dropped down to 119. Patient's blood pressure was noted to be dropping down to 70s on standing, he was rehydrated, and continued on fludrocortisone for adrenal insufficiency. Patient was noted to be hypoxic in the hospital . Initial chest  x-ray done 8th of January revealed no acute abnormalities . The angiogram of the chest revealed nodular consolidations in the lungs , likely inflammatory, but no pulmonary embolism. She was managed symptomatically. However, became unresponsive after aspiration and the CODE BLUE was called for respiratory arrest. Patient's airways were suctioned oxygen applied and he became responsive. Modified barium barium swallowing study was ordered, however, not performed due to broken equipment. Barium swallowing study then was ordered, however, patient aspirated barium. NG tube was placed and tube feeds were initiated, as well as antibiotic therapy for aspiration pneumonitis. Patient underwent EGD revealing difficult penetrance of scope through Esophagus suggesting extrinsic compression and normal stomach and duodenum,  PEG tube was placed by Dr. Candace Cruise 13th of January 2017. PEG tube feeds were initiated on January 14 and patient tolerated them well. Patient was placed to strict nothing by mouth. Soft tissue neck CT was performed which revealed no acute abnormalities, patient refused neck MRI. Patient's diet was advanced and physical therapist evaluation was performed home health services were recommended. Patient was felt to be stable to be discharged home today Discussion by problem *acute respiratory failure with hypoxia, aspiration related, repeated chest xray revealed left-sided pneumonia, but CT of neck showed groundglass attenuation in right upper lobe concerning for aspiration pneumonitis , patient was continued on O2 in the hospital and was prequalified for oxygen for home, his oxygen saturation dropped down to 82% on room air on exertion, but improved to 97% on 2 L , patient is to continue nothing by mouth until after neurosurgical evaluation/treatment *Aspiration pneumonitis, continue levofloxacin  per G-tube, improved overall clinically. Requires oxygen therapy at home, to be weaned off as  tolerated *dysphagia, due to cervical spinal pathology, according to EGD, no esophageal pathology seen, status post recurrent aspirations , now patient is NPO, PEG tube was placed by Dr. Candace Cruise. January 13TH, 2017. Tube Feedings started 02/10/15. Brain and cervical spine MRI done at [facility] revealed multilevel spondylosis, mostly pronounced at C2-C3 vertebral level , severe spinal canal stenosis. Patient refused to repeat MRI here in this hospital. Palliative care was consulted to discuss risks of eating Patient is DO NOT RESUSCITATE per his request . CT scan of the neck soft tissues didn't show significant pathology.  *. Hyponatremia, due to SIADH versus adrenal insufficiency, sodium level has improved to 131 , patient is to continue diet per dietary recommendations, with some fluid restrictions, he is nothing by mouth as mentioned above *. Orthostatic hypotension, due to adrenal insufficiency, continue  Fludrocortisone.  *. Leukopenia, chronic,  follow as outpatient, continue antibiotic therapy *. anemia, stable, no bleeding noted  *. Generalized weakness with falls, appreciate physical therapist input, recommended home health physical therapy, patient will be discharged home today with home health physical therapy, patient is agreeable, although patient's wife is very concerned, care management is consulted about supplying patient's wife was care providers for hire *Cervical spinal stenosis, patient is to get second opinion from neurosurgery in the next 1-2 weeks, according to patient, he was recommended to keep this appointment  DISCHARGE CONDITIONS:   Stable  CONSULTS OBTAINED:  Treatment Team:  Lavonia Dana, MD  DRUG ALLERGIES:   Allergies  Allergen Reactions  . Simvastatin Anxiety    DISCHARGE MEDICATIONS:   Current Discharge Medication List    START taking these medications   Details  HYDROcodone-acetaminophen (NORCO/VICODIN) 5-325 MG tablet Place 1 tablet into feeding  tube every 4 (four) hours as needed for moderate pain. Qty: 30 tablet, Refills: 0    hydroxyurea (HYDREA) 100 mg/mL SUSP Place 5 mLs (500 mg total) into feeding tube every 8 (eight) hours. Qty: 450 mL, Refills: 6    ipratropium-albuterol (DUONEB) 0.5-2.5 (3) MG/3ML SOLN Take 3 mLs by nebulization every 4 (four) hours as needed. Qty: 360 mL, Refills: 6    levofloxacin (LEVAQUIN) 500 MG tablet Take 1 tablet (500 mg total) by mouth daily. Qty: 6 tablet, Refills: 0    Nutritional Supplements (FEEDING SUPPLEMENT, JEVITY 1.5 CAL/FIBER,) LIQD Place 360 mLs into feeding tube 4 (four) times daily. Qty: 120 mL, Refills: 6    ranitidine (ZANTAC) 150 MG tablet Take 1 tablet (150 mg total) by mouth 2 (two) times daily. Qty: 60 tablet, Refills: 6      CONTINUE these medications which have CHANGED   Details  hydroxyurea (HYDREA) 500 MG capsule Take 1 capsule (500 mg total) by mouth 3 (three) times daily with meals. Qty: 90 capsule, Refills: 6      CONTINUE these medications which have NOT CHANGED   Details  aspirin 81 MG EC tablet Take 162 mg by mouth daily.     clonazePAM (KLONOPIN) 0.5 MG tablet Take 1 tablet (0.5 mg total) by mouth 3 (three) times daily as needed for anxiety. Qty: 90 tablet, Refills: 3   Associated Diagnoses: Anxiety state    escitalopram (LEXAPRO) 20 MG tablet Take 20 mg by mouth daily.    fludrocortisone (FLORINEF) 0.1 MG tablet Take 1 tablet (0.1 mg total) by mouth daily. Qty: 30 tablet, Refills: 5    fluorouracil (EFUDEX) 5 % cream Apply 1 application  topically as needed.    fluticasone (FLONASE) 50 MCG/ACT nasal spray Place 1 spray into both nostrils daily as needed for rhinitis. Qty: 16 g, Refills: 6    Fluticasone Furoate-Vilanterol (BREO ELLIPTA) 200-25 MCG/INH AEPB Inhale 200 mcg into the lungs daily. Qty: 1 each, Refills: 6    levothyroxine (SYNTHROID, LEVOTHROID) 137 MCG tablet Take 1 tablet (137 mcg total) by mouth daily. Qty: 90 tablet, Refills: 3     meclizine (ANTIVERT) 25 MG tablet Take 25 mg by mouth 2 (two) times daily as needed for dizziness.     mometasone (ELOCON) 0.1 % lotion Apply 1 application topically as needed (for irritation).     nystatin (MYCOSTATIN) 100000 UNIT/ML suspension Take 5 mLs by mouth as needed (for thrush).     Omega-3 Fatty Acids (FISH OIL) 1000 MG CAPS Take 1,000 mg by mouth daily.    traZODone (DESYREL) 50 MG tablet Take 0.5-1 tablets (25-50 mg total) by mouth at bedtime as needed for sleep. Qty: 30 tablet, Refills: 2   Associated Diagnoses: Insomnia    vitamin B-12 (CYANOCOBALAMIN) 1000 MCG tablet Take 1,000 mcg by mouth daily.      STOP taking these medications     omeprazole (PRILOSEC) 20 MG capsule          DISCHARGE INSTRUCTIONS:    Patient is to follow-up with his primary care physician, neurosurgery as outpatient. Home health services will be provided for patient upon discharge  If you experience worsening of your admission symptoms, develop shortness of breath, life threatening emergency, suicidal or homicidal thoughts you must seek medical attention immediately by calling 911 or calling your MD immediately  if symptoms less severe.  You Must read complete instructions/literature along with all the possible adverse reactions/side effects for all the Medicines you take and that have been prescribed to you. Take any new Medicines after you have completely understood and accept all the possible adverse reactions/side effects.   Please note  You were cared for by a hospitalist during your hospital stay. If you have any questions about your discharge medications or the care you received while you were in the hospital after you are discharged, you can call the unit and asked to speak with the hospitalist on call if the hospitalist that took care of you is not available. Once you are discharged, your primary care physician will handle any further medical issues. Please note that NO REFILLS for  any discharge medications will be authorized once you are discharged, as it is imperative that you return to your primary care physician (or establish a relationship with a primary care physician if you do not have one) for your aftercare needs so that they can reassess your need for medications and monitor your lab values.    Today   CHIEF COMPLAINT:   Chief Complaint  Patient presents with  . low sodium     low sodium    HISTORY OF PRESENT ILLNESS:  Macarthur Shortino  is a 75 y.o. male with a known history of SIADH, who presents to the hospital with complaints of low sodium levels, 123. Apparently patient has been drinking plenty of fluids, although was recommended to be on fluid restriction. In the hospital patient was noted to be orthostatically hypotensive and IV fluids were administered. His sodium level has improved from 123-126 initially but then dropped down to 119. Patient's blood pressure was noted to be dropping down to 70s on standing, he was rehydrated, and continued on fludrocortisone for adrenal  insufficiency. Patient was noted to be hypoxic in the hospital . Initial chest x-ray done 8th of January revealed no acute abnormalities . The angiogram of the chest revealed nodular consolidations in the lungs , likely inflammatory, but no pulmonary embolism. She was managed symptomatically. However, became unresponsive after aspiration and the CODE BLUE was called for respiratory arrest. Patient's airways were suctioned oxygen applied and he became responsive. Modified barium barium swallowing study was ordered, however, not performed due to broken equipment. Barium swallowing study then was ordered, however, patient aspirated barium. NG tube was placed and tube feeds were initiated, as well as antibiotic therapy for aspiration pneumonitis. Patient underwent EGD revealing difficult penetrance of scope through Esophagus suggesting extrinsic compression and normal stomach and duodenum,  PEG tube  was placed by Dr. Candace Cruise 13th of January 2017. PEG tube feeds were initiated on January 14 and patient tolerated them well. Patient was placed to strict nothing by mouth. Soft tissue neck CT was performed which revealed no acute abnormalities, patient refused neck MRI. Patient's diet was advanced and physical therapist evaluation was performed home health services were recommended. Patient was felt to be stable to be discharged home today Discussion by problem *acute respiratory failure with hypoxia, aspiration related, repeated chest xray revealed left-sided pneumonia, but CT of neck showed groundglass attenuation in right upper lobe concerning for aspiration pneumonitis , patient was continued on O2 in the hospital and was prequalified for oxygen for home, his oxygen saturation dropped down to 82% on room air on exertion, but improved to 97% on 2 L , patient is to continue nothing by mouth until after neurosurgical evaluation/treatment *Aspiration pneumonitis, continue levofloxacin  per G-tube, improved overall clinically. Requires oxygen therapy at home, to be weaned off as tolerated *dysphagia, due to cervical spinal pathology, according to EGD, no esophageal pathology seen, status post recurrent aspirations , now patient is NPO, PEG tube was placed by Dr. Candace Cruise. January 13TH, 2017. Tube Feedings started 02/10/15. Brain and cervical spine MRI done at [facility] revealed multilevel spondylosis, mostly pronounced at C2-C3 vertebral level , severe spinal canal stenosis. Patient refused to repeat MRI here in this hospital. Palliative care was consulted to discuss risks of eating Patient is DO NOT RESUSCITATE per his request . CT scan of the neck soft tissues didn't show significant pathology.  *. Hyponatremia, due to SIADH versus adrenal insufficiency, sodium level has improved to 131 , patient is to continue diet per dietary recommendations, with some fluid restrictions, he is nothing by mouth as mentioned  above *. Orthostatic hypotension, due to adrenal insufficiency, continue  Fludrocortisone.  *. Leukopenia, chronic,  follow as outpatient, continue antibiotic therapy *. anemia, stable, no bleeding noted  *. Generalized weakness with falls, appreciate physical therapist input, recommended home health physical therapy, patient will be discharged home today with home health physical therapy, patient is agreeable, although patient's wife is very concerned, care management is consulted about supplying patient's wife was care providers for hire *Cervical spinal stenosis, patient is to get second opinion from neurosurgery in the next 1-2 weeks, according to patient, he was recommended to keep this appointment    VITAL SIGNS:  Blood pressure 100/64, pulse 85, temperature 98 F (36.7 C), temperature source Oral, resp. rate 20, height 5\' 4"  (1.626 m), weight 63.821 kg (140 lb 11.2 oz), SpO2 97 %.  I/O:   Intake/Output Summary (Last 24 hours) at 02/12/15 1650 Last data filed at 02/12/15 1300  Gross per 24 hour  Intake  350 ml  Output    750 ml  Net   -400 ml    PHYSICAL EXAMINATION:  GENERAL:  75 y.o.-year-old patient lying in the bed with no acute distress.  EYES: Pupils equal, round, reactive to light and accommodation. No scleral icterus. Extraocular muscles intact.  HEENT: Head atraumatic, normocephalic. Oropharynx and nasopharynx clear.  NECK:  Supple, no jugular venous distention. No thyroid enlargement, no tenderness.  LUNGS: Rhonchorous breath sounds bilaterally, no wheezing,  Crepitations, better air entrance overall. No use of accessory muscles of respiration.  CARDIOVASCULAR: S1, S2 normal. No murmurs, rubs, or gallops.  ABDOMEN: Soft, non-tender, non-distended. PEG tube is placed in left upper quadrant, no significant tenderness on palpation. No bleeding or discharge.  Bowel sounds present. No organomegaly or mass.  EXTREMITIES: No pedal edema, cyanosis, or clubbing.   NEUROLOGIC: Cranial nerves II through XII are intact. Muscle strength 5/5 in all extremities. Sensation intact. Gait not checked.  PSYCHIATRIC: The patient is alert and oriented x 3.  SKIN: No obvious rash, lesion, or ulcer.   DATA REVIEW:   CBC  Recent Labs Lab 02/11/15 0901  WBC 2.8*  HGB 8.8*  HCT 26.0*  PLT 337    Chemistries   Recent Labs Lab 02/10/15 0611  02/12/15 0808  NA 130*  < > 131*  K 4.1  --   --   CL 94*  --   --   CO2 26  --   --   GLUCOSE 84  --   --   BUN 12  --   --   CREATININE 0.44*  --   --   CALCIUM 8.4*  --   --   < > = values in this interval not displayed.  Cardiac Enzymes No results for input(s): TROPONINI in the last 168 hours.  Microbiology Results  Results for orders placed or performed in visit on 02/13/13  Influenza A&B Antigens [facility])     Status: None   Collection Time: 02/13/13  3:07 PM  Result Value Ref Range Status   Micro Text Report   Final       COMMENT                   NEGATIVE FOR INFLUENZA A (ANTIGEN ABSENT)   COMMENT                   NEGATIVE FOR INFLUENZA B (ANTIGEN ABSENT)   ANTIBIOTIC                                                        RADIOLOGY:  No results found.  EKG:   Orders placed or performed in visit on 03/17/14  . EKG 12-Lead      Management plans discussed with the patient, family and they are in agreement.  CODE STATUS:     Code Status Orders        Start     Ordered   02/07/15 1349  Do not attempt resuscitation (DNR)   Continuous    Question Answer Comment  In the event of cardiac or respiratory ARREST Do not call a "code blue"   In the event of cardiac or respiratory ARREST Do not perform Intubation, CPR, defibrillation or ACLS   In the event of cardiac or respiratory ARREST Use medication  by any route, position, wound care, and other measures to relive pain and suffering. May use oxygen, suction and manual treatment of airway obstruction as needed for comfort.      02/07/15  1348    Code Status History    Date Active Date Inactive Code Status Order ID Comments User Context   02/02/2015  9:31 PM 02/07/2015  1:48 PM Full Code [id number]  Gladstone Lighter, MD Inpatient      TOTAL TIME TAKING CARE OF THIS PATIENT: 40 minutes.    Theodoro Grist M.D on 02/12/2015 at 4:50 PM  Between 7am to 6pm - Pager - [phone]  After 6pm go to [email or web address] - password [email or web address] [facility]  Office  [phone]  CC: Primary care physician; Rica Mast, MD

## 2015-02-12 NOTE — Progress Notes (Signed)
Discussed discharge instructions and medication with pt and his wife at bedside.  Education given to pt's wife on tube feedings and peg tube care with teach back.  Pt and wife were able to complete tube feeding and care.  Will from [place] spoke with pt and wife before discharge as well as Case Management.  IV removed per policy. Pt's wife may aware of follow up appointments and medications sent to pharmacy.  Pt given RX. Pt transported home via car by wife and son.  Clarise Cruz, RN

## 2015-02-12 NOTE — Progress Notes (Signed)
Physical Therapy Treatment Patient Details Name: Johnny Sanford MRN: [id number] DOB: December 24, 1940 Today's Date: 02/12/2015    History of Present Illness Pt here with hyponatremia, has been having some falls and confusion recently.  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 Pt's stay has been complicated with Code Blue called during episode of apiration. Pt now with PEG placement on 02/10/15.    PT Comments    Pt is making good progress towards goals with improved safety with ambulation this date. Pt able to demonstrate safe speed with rw. Cues given for pursed lip breathing, however pt denies SOB symptoms with exertion despite O2 sats decreasing. RN aware. CM aware that pt would benefit from RW to use in home environment to decrease fall risk and maintain energy conservation.   Follow Up Recommendations  Home health PT     Equipment Recommendations  Rolling walker with 5" wheels    Recommendations for Other Services       Precautions / Restrictions Precautions Precautions: Fall Precaution Comments: Monitor oxygen saturations Restrictions Weight Bearing Restrictions: No    Mobility  Bed Mobility Overal bed mobility: Modified Independent             General bed mobility comments: Patient able to use UE/LEs to bridge, push, and pull. Patient utilized bed rails to scoot to [facility].  Transfers Overall transfer level: Needs assistance Equipment used: Rolling walker (2 wheeled) Transfers: Sit to/from Stand Sit to Stand: Supervision         General transfer comment: safe technique performed with no assist required from therapist. RW used for safety  Ambulation/Gait Ambulation/Gait assistance: Supervision Ambulation Distance (Feet): 200 Feet Assistive device: Rolling walker (2 wheeled) Gait Pattern/deviations: Step-through pattern     General Gait Details: Pt demonstrates improved ambulation safety with keeping rw close to body. No LOB  noted. Pt does not experience SOB, however O2 sats decrease to 84% with exertion. Pt able to complete 10' walk test in 8 seconds. Reciprocal gait pattern performed.    Stairs            Wheelchair Mobility    Modified Rankin (Stroke Patients Only)       Balance                                    Cognition Arousal/Alertness: Awake/alert Behavior During Therapy: WFL for tasks assessed/performed;Impulsive Overall Cognitive Status: Within Functional Limits for tasks assessed                      Exercises      General Comments        Pertinent Vitals/Pain Pain Assessment: No/denies pain    Home Living                      Prior Function            PT Goals (current goals can now be found in the care plan section) Acute Rehab PT Goals Patient Stated Goal: To go home PT Goal Formulation: With patient Time For Goal Achievement: 02/17/15 Potential to Achieve Goals: Good Progress towards PT goals: Progressing toward goals    Frequency  Min 2X/week    PT Plan Current plan remains appropriate    Co-evaluation             End of Session Equipment Utilized During Treatment: Gait belt  Activity Tolerance: Patient tolerated treatment well Patient left: in bed;with call bell/phone within reach;with bed alarm set     Time: [id number] PT Time Calculation (min) (ACUTE ONLY): 18 min  Charges:  $Gait Training: 8-22 mins                    G Codes:      Fredda Clarida 2015-02-23, 11:43 AM Greggory Stallion, PT, DPT [phone]

## 2015-02-12 NOTE — Care Management Important Message (Signed)
Important Message  Patient Details  Name: Johnny Sanford MRN: [id number] Date of Birth: 1940/03/28   Medicare Important Message Given:  Yes    Juliann Pulse A Deovion Batrez 02/12/2015, 10:21 AM

## 2015-02-12 NOTE — Progress Notes (Addendum)
SATURATION QUALIFICATIONS: (This note is used to comply with regulatory documentation for home oxygen)  Patient Saturations on Room Air at Rest = 96%  Patient Saturations on Room Air while Ambulating = 82%  Patient Saturations on 3 Liters of oxygen while Ambulating = 92%  Please briefly explain why patient needs home oxygen:Hypoxia

## 2015-02-12 NOTE — Care Management (Signed)
Discharge to home today per Dr. Ether Griffins. [facility] will be providing  Jevity, rolling walker, nebulizer, Home oxygen, Home Health, and physical therapy.  Will give Ms Ashmead contact numbers for [place] and the personal care list.  Wife will transport. Shelbie Ammons RN MSN CCM Care Management    [phone]

## 2015-02-12 NOTE — Progress Notes (Signed)
Speech Therapy Note: reviewed chart notes; consulted NSG. Pt received a PEG placement sec. To Esophageal phase dysphagia and dysmotility impacting his swallowing and safety w/ swallowing(pt had a choking episode during a meal in which a code was called per chart notes). After further GI assessment, it was determined pt would benefit from a PEG placement. Pt is tolerating this well and potentially discharging home soon. Rec. F/u w/ ST once discharged for further education on potential pleasure po's of ice chips (post thorough oral care/hygiene) post discussion w/ primary MD in order to maintain swallowing for function and hygiene as well as quality of life. General oral care has been discussed w/ pt while admitted but he could benefit from further discussion/education.

## 2015-02-12 NOTE — Progress Notes (Signed)
Nutrition Follow-up   INTERVENTION:   EN: Recommend increasing to goal rate of 1.5 cans of Jevity 1.5 QID as pt tolerating 4 full cans very well per report. At discharge recommend continuing 6 cans of Jevity daily with additional 200-262mL of free water to maintain hydration (total intake of 2133kcals, 90g protein, 1721-1879mL of free water daily).  Education: RD provided "Tube Feeding at Home" Booklet from [employer] as well as Recommended Feeding Schedule for pt. RD educated pt on tube feeding regimen, formula, and free water, including additional free water flushes for hydration. RD educated pt on signs and symptoms of dehydration and how to adjust free water amounts. RD also discussed signs and symptoms of intolerance and some recommendations accordingly. RD educated on cleanliness and safety procedures as well for administering tube feeding each bolus. Expect good compliance. Wife and pt very attentive and receptive to information.   NUTRITION DIAGNOSIS:   Inadequate oral intake related to dysphagia as evidenced by NPO status; Being addressed with EN  GOAL:   Patient will meet greater than or equal to 90% of their needs; ongoing  MONITOR:    (Energy intake, Electrolyte and renal profile, Digestive system)  REASON FOR ASSESSMENT:   Consult Enteral/tube feeding initiation and management  ASSESSMENT:   Plan for pt to go home at discharge with wife, possibly today per report.   Diet Order:  Diet NPO time specified    EN: Pt tolerating EN very well this am via PEG. Jevity 1.5 one full can QID at this time with free water 52mL before and after each feeding.   Gastrointestinal Profile: Last BM: 02/11/2015   Scheduled Medications:  . escitalopram  20 mg Per Tube Daily  . feeding supplement (JEVITY 1.5 CAL/FIBER)  360 mL Per Tube QID  . fludrocortisone  0.1 mg Per Tube Daily  . hydroxyurea  500 mg Per Tube 3 times per day  . ipratropium-albuterol  3 mL Nebulization TID   . levofloxacin  500 mg Oral Daily  . levothyroxine  137 mcg Per Tube QAC breakfast  . mometasone-formoterol  2 puff Inhalation BID  . pantoprazole sodium  40 mg Per Tube QAC breakfast  . sodium chloride  3 mL Intravenous Q12H  . vitamin B-12  1,000 mcg Per Tube Daily     Electrolyte/Renal Profile and Glucose Profile:   Recent Labs Lab 02/06/15 0617  02/08/15 0706 02/10/15 0611 02/11/15 0901 02/12/15 0808  NA 127*  < > 130* 130* 128* 131*  K 3.5  --  4.5 4.1  --   --   CL 88*  --  92* 94*  --   --   CO2 33*  --  33* 26  --   --   BUN 19  --  13 12  --   --   CREATININE 0.61  --  0.52* 0.44*  --   --   CALCIUM 8.8*  --  8.6* 8.4*  --   --   GLUCOSE 112*  --  101* 84  --   --   < > = values in this interval not displayed. Protein Profile: No results for input(s): ALBUMIN in the last 168 hours.   Weight Trend since Admission: Filed Weights   02/10/15 0501 02/11/15 0519 02/12/15 0500  Weight: 136 lb 3.2 oz (61.78 kg) 135 lb 11.2 oz (61.553 kg) 140 lb 11.2 oz (63.821 kg)    Ideal Body Weight:     BMI:  Body mass index is 24.14  kg/(m^2).  Estimated Nutritional Needs:   Kcal:  BEE 1326 kcals (IF 1.0-1.3, AF 1.3) [id number] kcals/d  Protein:  (1.0-1.2 g/kg) 67-80 g/d   Fluid:  (25-4ml/kg) 1675-2011ml/d  EDUCATION NEEDS:   No education needs identified at this time   [place], RD, LDN Pager [phone] Weekend/On-Call Pager [phone]

## 2015-02-12 NOTE — Clinical Social Work Note (Signed)
CSW consulted for possible placement stating that patient refuses and wife is concerned. PT has assessed patient and recommended home health. RN CM has informed CSW that the discharge plan is for home with home health. Please reconsult CSW if needed. Shela Leff MSW,LCSW [phone]

## 2015-02-12 NOTE — Progress Notes (Signed)
[facility]  ROUNDING NOTE   Subjective:  Sodium overall improved from admission. Currently up to 131. Wife at bedside. It appears that the patient will be discharged to home. However it was recommended that he consider rehabilitation.   Objective:  Vital signs in last 24 hours:  Temp:  [97.3 F (36.3 C)-98.2 F (36.8 C)] 97.3 F (36.3 C) (01/16 0531) Pulse Rate:  [43-93] 43 (01/16 1320) Resp:  [20] 20 (01/16 0531) BP: (111-136)/(71-79) 136/79 mmHg (01/16 0531) SpO2:  [82 %-96 %] 82 % (01/16 1320) Weight:  [63.821 kg (140 lb 11.2 oz)] 63.821 kg (140 lb 11.2 oz) (01/16 0500)  Weight change: 2.268 kg (5 lb) Filed Weights   02/10/15 0501 02/11/15 0519 02/12/15 0500  Weight: 61.78 kg (136 lb 3.2 oz) 61.553 kg (135 lb 11.2 oz) 63.821 kg (140 lb 11.2 oz)    Intake/Output: I/O last 3 completed shifts: In: 3384 [Other:1087; NG/GT:2297] Out: 800 [Urine:800]   Intake/Output this shift:     Physical Exam: General: NAD  Head: Normocephalic, atraumatic. moist oral mucosal membranes  Eyes/ENT: Anicteric  Neck: Supple, trachea midline  Lungs:  Clear to auscultation  Heart: Regular rate and rhythm  Abdomen:  Soft, nontender, PEG in place  Extremities: no peripheral edema.  Neurologic: Nonfocal, moving all four extremities, strength 5/5 all four extremities.   Skin: No lesions       Basic Metabolic Panel:  Recent Labs Lab 02/06/15 0617  02/07/15 1358 02/08/15 0706 02/10/15 0611 02/11/15 0901 02/12/15 0808  NA 127*  < > 128* 130* 130* 128* 131*  K 3.5  --   --  4.5 4.1  --   --   CL 88*  --   --  92* 94*  --   --   CO2 33*  --   --  33* 26  --   --   GLUCOSE 112*  --   --  101* 84  --   --   BUN 19  --   --  13 12  --   --   CREATININE 0.61  --   --  0.52* 0.44*  --   --   CALCIUM 8.8*  --   --  8.6* 8.4*  --   --   < > = values in this interval not displayed.  Liver Function Tests: No results for input(s): AST, ALT, ALKPHOS, BILITOT, PROT, ALBUMIN  in the last 168 hours. No results for input(s): LIPASE, AMYLASE in the last 168 hours. No results for input(s): AMMONIA in the last 168 hours.  CBC:  Recent Labs Lab 02/06/15 0617 02/10/15 0611 02/11/15 0901  WBC 3.2*  --  2.8*  HGB 9.7* 9.0* 8.8*  HCT 27.9*  --  26.0*  MCV 127.8*  --  127.8*  PLT 381  --  337    Cardiac Enzymes: No results for input(s): CKTOTAL, CKMB, CKMBINDEX, TROPONINI in the last 168 hours.  BNP: Invalid input(s): POCBNP  CBG: No results for input(s): GLUCAP in the last 168 hours.  Microbiology: Results for orders placed or performed in visit on 02/13/13  Influenza A&B Antigens [facility])     Status: None   Collection Time: 02/13/13  3:07 PM  Result Value Ref Range Status   Micro Text Report   Final       COMMENT                   NEGATIVE FOR INFLUENZA A (ANTIGEN ABSENT)   COMMENT  NEGATIVE FOR INFLUENZA B (ANTIGEN ABSENT)   ANTIBIOTIC                                                        Coagulation Studies: No results for input(s): LABPROT, INR in the last 72 hours.  Urinalysis: No results for input(s): COLORURINE, LABSPEC, PHURINE, GLUCOSEU, HGBUR, BILIRUBINUR, KETONESUR, PROTEINUR, UROBILINOGEN, NITRITE, LEUKOCYTESUR in the last 72 hours.  Invalid input(s): APPERANCEUR    Imaging: No results found.   Medications:   Scheduled Meds: . escitalopram  20 mg Per Tube Daily  . feeding supplement (JEVITY 1.5 CAL/FIBER)  360 mL Per Tube QID  . fludrocortisone  0.1 mg Per Tube Daily  . hydroxyurea  500 mg Per Tube 3 times per day  . ipratropium-albuterol  3 mL Nebulization TID  . levofloxacin  500 mg Oral Daily  . levothyroxine  137 mcg Per Tube QAC breakfast  . mometasone-formoterol  2 puff Inhalation BID  . pantoprazole sodium  40 mg Per Tube QAC breakfast  . sodium chloride  3 mL Intravenous Q12H  . vitamin B-12  1,000 mcg Per Tube Daily   Continuous Infusions:   PRN Meds:.acetaminophen **OR** acetaminophen,  clonazePAM, fluticasone, HYDROcodone-acetaminophen, ipratropium-albuterol, LORazepam, meclizine, morphine injection, [DISCONTINUED] ondansetron **OR** ondansetron (ZOFRAN) IV, traZODone    Assessment/ Plan:  [name] is a 75 y.o. 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   1. Hyponatremia: hypo-osmolar and euvolemic. Dry mucosal membranes are baseline for patient due to previous parotid gland surgery  - Sodium is stabilized at 131.  Continue fludrocortisone at this point in time. Patient is also on tube feeds now.  2. Hypotension: blood pressure 136/79 today.  Continue fludrocortisone as above.  3. Anemia/Thrombocytopenia:  hemoglobin currently 8.8, platelets 337,000.  The patient on hydroxyurea.  4.  Follow up with Dr. Juleen Sanford as an outpatient in 1-2 weeks.      LOS: 10 Johnny Sanford 1/16/20171:21 PM

## 2015-02-12 NOTE — Progress Notes (Signed)
Appointment for Dr. Juleen China made, given to pt's son.  Clarise Cruz, RN

## 2015-02-13 ENCOUNTER — Telehealth: Payer: Self-pay

## 2015-02-13 DIAGNOSIS — E039 Hypothyroidism, unspecified: Secondary | ICD-10-CM | POA: Diagnosis not present

## 2015-02-13 DIAGNOSIS — M4802 Spinal stenosis, cervical region: Secondary | ICD-10-CM | POA: Diagnosis not present

## 2015-02-13 DIAGNOSIS — D649 Anemia, unspecified: Secondary | ICD-10-CM | POA: Diagnosis not present

## 2015-02-13 DIAGNOSIS — Z9181 History of falling: Secondary | ICD-10-CM | POA: Diagnosis not present

## 2015-02-13 DIAGNOSIS — M069 Rheumatoid arthritis, unspecified: Secondary | ICD-10-CM | POA: Diagnosis not present

## 2015-02-13 DIAGNOSIS — Z7982 Long term (current) use of aspirin: Secondary | ICD-10-CM | POA: Diagnosis not present

## 2015-02-13 DIAGNOSIS — E785 Hyperlipidemia, unspecified: Secondary | ICD-10-CM | POA: Diagnosis not present

## 2015-02-13 DIAGNOSIS — Z431 Encounter for attention to gastrostomy: Secondary | ICD-10-CM | POA: Diagnosis not present

## 2015-02-13 DIAGNOSIS — R1310 Dysphagia, unspecified: Secondary | ICD-10-CM | POA: Diagnosis not present

## 2015-02-13 DIAGNOSIS — J690 Pneumonitis due to inhalation of food and vomit: Secondary | ICD-10-CM | POA: Diagnosis not present

## 2015-02-13 DIAGNOSIS — I10 Essential (primary) hypertension: Secondary | ICD-10-CM | POA: Diagnosis not present

## 2015-02-13 NOTE — Telephone Encounter (Signed)
Transition Care Management Follow-up Telephone Call   Date discharged? 02/12/15   How have you been since you were released from the hospital? I am doing okay.  Peg tube is going well.  Home oxygen in use. Nebulizer in use 4 times daily. No BM since returning home. Home health and PT came to do evaluation.   Do you understand why you were in the hospital? Yes, low sodium, then choking.  Failed swallowing tests while admitted.   Do you understand the discharge instructions? Yes, and wife is assisting when necessary.   Where were you discharged to? Home.   Items Reviewed:  Medications reviewed: Yes, taking scheduled medications, crushed, no issues.  Allergies reviewed: Yes, no changes  Dietary changes reviewed: Peg tube feeding.  Referrals reviewed: Yes, neurology and PCP appointments   Functional Questionnaire:   Activities of Daily Living (ADLs):   He states they are independent in the following: Bathing, dressing, toileting, grooming. States they require assistance with the following: Ambulating(walker/cane in use), meal prep.  Wife to assist.   Any transportation issues/concerns?: No.   Any patient concerns? Yes. Request to decrease FLORINEF dosage.   Confirmed importance and date/time of follow-up visits scheduled Yes, appointment scheduled 02/20/15 at 1130.  Provider Appointment booked with Dr. Gilford Rile (PCP).  Confirmed with patient if condition begins to worsen call PCP or go to the ER.  Patient was given the office number and encouraged to call back with question or concerns.  : Yes, patient and wife verbalized understanding.

## 2015-02-14 ENCOUNTER — Encounter: Payer: Self-pay | Admitting: Gastroenterology

## 2015-02-14 ENCOUNTER — Telehealth: Payer: Self-pay | Admitting: Oncology

## 2015-02-14 DIAGNOSIS — E039 Hypothyroidism, unspecified: Secondary | ICD-10-CM | POA: Diagnosis not present

## 2015-02-14 DIAGNOSIS — E785 Hyperlipidemia, unspecified: Secondary | ICD-10-CM | POA: Diagnosis not present

## 2015-02-14 DIAGNOSIS — D649 Anemia, unspecified: Secondary | ICD-10-CM | POA: Diagnosis not present

## 2015-02-14 DIAGNOSIS — I10 Essential (primary) hypertension: Secondary | ICD-10-CM | POA: Diagnosis not present

## 2015-02-14 DIAGNOSIS — Z7982 Long term (current) use of aspirin: Secondary | ICD-10-CM | POA: Diagnosis not present

## 2015-02-14 DIAGNOSIS — R1310 Dysphagia, unspecified: Secondary | ICD-10-CM | POA: Diagnosis not present

## 2015-02-14 DIAGNOSIS — M4802 Spinal stenosis, cervical region: Secondary | ICD-10-CM | POA: Diagnosis not present

## 2015-02-14 DIAGNOSIS — M069 Rheumatoid arthritis, unspecified: Secondary | ICD-10-CM | POA: Diagnosis not present

## 2015-02-14 DIAGNOSIS — J690 Pneumonitis due to inhalation of food and vomit: Secondary | ICD-10-CM | POA: Diagnosis not present

## 2015-02-14 DIAGNOSIS — Z431 Encounter for attention to gastrostomy: Secondary | ICD-10-CM | POA: Diagnosis not present

## 2015-02-14 DIAGNOSIS — Z9181 History of falling: Secondary | ICD-10-CM | POA: Diagnosis not present

## 2015-02-14 NOTE — Telephone Encounter (Signed)
Patient was hospitalized for 10 days and had feeding tube placed. While inpatient, staff administered his plt med, hydroxyrea, in liquid form through the feeding tube. On Monday his plts were in the 350s. Now that he is home they are having difficulty locating the medicine in liquid form. Pharmacist told them it is not manufactured for OTC sale in liquid form. Please advise: [phone].

## 2015-02-14 NOTE — Telephone Encounter (Signed)
Patient can dissolve Hydrea.  He is scheduled for labs on Friday but since he had labs a couple days ago can reschedule 2 weeks out.  If he is still receiving Empire at that time his wife is going to call us for an order to be drawn at home.

## 2015-02-15 ENCOUNTER — Ambulatory Visit: Payer: Medicare Other | Admitting: Internal Medicine

## 2015-02-15 DIAGNOSIS — R1310 Dysphagia, unspecified: Secondary | ICD-10-CM | POA: Diagnosis not present

## 2015-02-15 DIAGNOSIS — D649 Anemia, unspecified: Secondary | ICD-10-CM | POA: Diagnosis not present

## 2015-02-15 DIAGNOSIS — M4802 Spinal stenosis, cervical region: Secondary | ICD-10-CM | POA: Diagnosis not present

## 2015-02-15 DIAGNOSIS — Z431 Encounter for attention to gastrostomy: Secondary | ICD-10-CM | POA: Diagnosis not present

## 2015-02-15 DIAGNOSIS — Z7982 Long term (current) use of aspirin: Secondary | ICD-10-CM | POA: Diagnosis not present

## 2015-02-15 DIAGNOSIS — E039 Hypothyroidism, unspecified: Secondary | ICD-10-CM | POA: Diagnosis not present

## 2015-02-15 DIAGNOSIS — M069 Rheumatoid arthritis, unspecified: Secondary | ICD-10-CM | POA: Diagnosis not present

## 2015-02-15 DIAGNOSIS — J690 Pneumonitis due to inhalation of food and vomit: Secondary | ICD-10-CM | POA: Diagnosis not present

## 2015-02-15 DIAGNOSIS — Z9181 History of falling: Secondary | ICD-10-CM | POA: Diagnosis not present

## 2015-02-15 DIAGNOSIS — E785 Hyperlipidemia, unspecified: Secondary | ICD-10-CM | POA: Diagnosis not present

## 2015-02-15 DIAGNOSIS — I10 Essential (primary) hypertension: Secondary | ICD-10-CM | POA: Diagnosis not present

## 2015-02-16 ENCOUNTER — Other Ambulatory Visit: Payer: Medicare Other

## 2015-02-16 DIAGNOSIS — E039 Hypothyroidism, unspecified: Secondary | ICD-10-CM | POA: Diagnosis not present

## 2015-02-16 DIAGNOSIS — Z7982 Long term (current) use of aspirin: Secondary | ICD-10-CM | POA: Diagnosis not present

## 2015-02-16 DIAGNOSIS — Z9181 History of falling: Secondary | ICD-10-CM | POA: Diagnosis not present

## 2015-02-16 DIAGNOSIS — D649 Anemia, unspecified: Secondary | ICD-10-CM | POA: Diagnosis not present

## 2015-02-16 DIAGNOSIS — M069 Rheumatoid arthritis, unspecified: Secondary | ICD-10-CM | POA: Diagnosis not present

## 2015-02-16 DIAGNOSIS — E785 Hyperlipidemia, unspecified: Secondary | ICD-10-CM | POA: Diagnosis not present

## 2015-02-16 DIAGNOSIS — R1310 Dysphagia, unspecified: Secondary | ICD-10-CM | POA: Diagnosis not present

## 2015-02-16 DIAGNOSIS — M4802 Spinal stenosis, cervical region: Secondary | ICD-10-CM | POA: Diagnosis not present

## 2015-02-16 DIAGNOSIS — J690 Pneumonitis due to inhalation of food and vomit: Secondary | ICD-10-CM | POA: Diagnosis not present

## 2015-02-16 DIAGNOSIS — Z431 Encounter for attention to gastrostomy: Secondary | ICD-10-CM | POA: Diagnosis not present

## 2015-02-16 DIAGNOSIS — I10 Essential (primary) hypertension: Secondary | ICD-10-CM | POA: Diagnosis not present

## 2015-02-19 ENCOUNTER — Encounter: Payer: Self-pay | Admitting: Emergency Medicine

## 2015-02-19 ENCOUNTER — Emergency Department
Admission: EM | Admit: 2015-02-19 | Discharge: 2015-02-19 | Disposition: A | Discharge status: Home / Self Care | Payer: Medicare Other | Attending: Emergency Medicine | Admitting: Emergency Medicine

## 2015-02-19 ENCOUNTER — Telehealth: Payer: Self-pay | Admitting: *Deleted

## 2015-02-19 DIAGNOSIS — I69991 Dysphagia following unspecified cerebrovascular disease: Secondary | ICD-10-CM | POA: Diagnosis not present

## 2015-02-19 DIAGNOSIS — Z7952 Long term (current) use of systemic steroids: Secondary | ICD-10-CM | POA: Insufficient documentation

## 2015-02-19 DIAGNOSIS — K9429 Other complications of gastrostomy: Secondary | ICD-10-CM | POA: Diagnosis not present

## 2015-02-19 DIAGNOSIS — I10 Essential (primary) hypertension: Secondary | ICD-10-CM | POA: Insufficient documentation

## 2015-02-19 DIAGNOSIS — Z7982 Long term (current) use of aspirin: Secondary | ICD-10-CM | POA: Insufficient documentation

## 2015-02-19 DIAGNOSIS — Z79899 Other long term (current) drug therapy: Secondary | ICD-10-CM | POA: Insufficient documentation

## 2015-02-19 DIAGNOSIS — Z7951 Long term (current) use of inhaled steroids: Secondary | ICD-10-CM | POA: Insufficient documentation

## 2015-02-19 DIAGNOSIS — Z87891 Personal history of nicotine dependence: Secondary | ICD-10-CM | POA: Insufficient documentation

## 2015-02-19 DIAGNOSIS — Z792 Long term (current) use of antibiotics: Secondary | ICD-10-CM | POA: Insufficient documentation

## 2015-02-19 DIAGNOSIS — Z431 Encounter for attention to gastrostomy: Secondary | ICD-10-CM | POA: Diagnosis not present

## 2015-02-19 DIAGNOSIS — M6281 Muscle weakness (generalized): Secondary | ICD-10-CM | POA: Diagnosis not present

## 2015-02-19 DIAGNOSIS — J9601 Acute respiratory failure with hypoxia: Secondary | ICD-10-CM | POA: Diagnosis not present

## 2015-02-19 LAB — CBC
HEMATOCRIT: 27.2 % — AB (ref 40.0–52.0)
HEMOGLOBIN: 9.2 g/dL — AB (ref 13.0–18.0)
MCH: 43.4 pg — ABNORMAL HIGH (ref 26.0–34.0)
MCHC: 33.8 g/dL (ref 32.0–36.0)
MCV: 128.7 fL — ABNORMAL HIGH (ref 80.0–100.0)
Platelets: 596 10*3/uL — ABNORMAL HIGH (ref 150–440)
RBC: 2.12 MIL/uL — ABNORMAL LOW (ref 4.40–5.90)
RDW: 14.3 % (ref 11.5–14.5)
WBC: 3.2 10*3/uL — AB (ref 3.8–10.6)

## 2015-02-19 LAB — COMPREHENSIVE METABOLIC PANEL
ALBUMIN: 3.2 g/dL — AB (ref 3.5–5.0)
ALK PHOS: 48 U/L (ref 38–126)
ALT: 17 U/L (ref 17–63)
AST: 21 U/L (ref 15–41)
Anion gap: 12 (ref 5–15)
BILIRUBIN TOTAL: 0.5 mg/dL (ref 0.3–1.2)
BUN: 21 mg/dL — AB (ref 6–20)
CALCIUM: 8.4 mg/dL — AB (ref 8.9–10.3)
CO2: 30 mmol/L (ref 22–32)
Chloride: 89 mmol/L — ABNORMAL LOW (ref 101–111)
Creatinine, Ser: 0.51 mg/dL — ABNORMAL LOW (ref 0.61–1.24)
GFR calc Af Amer: >60 mL/min (ref >60)
GFR calc non Af Amer: >60 mL/min (ref >60)
GLUCOSE: 111 mg/dL — AB (ref 65–99)
POTASSIUM: 4.3 mmol/L (ref 3.5–5.1)
SODIUM: 131 mmol/L — AB (ref 135–145)
TOTAL PROTEIN: 6.8 g/dL (ref 6.5–8.1)

## 2015-02-19 NOTE — ED Notes (Addendum)
Pt sent from dr Myrna Blazer office for post op check.  Pt wife states surgical incision looks infected and she called the office and was told to come to the ED.

## 2015-02-19 NOTE — Telephone Encounter (Signed)
Please advise 

## 2015-02-19 NOTE — Telephone Encounter (Signed)
Patients wife has requested a Rx to be sent over to [facility] for a feeding pump. She stated that the bolus was causing patient to loose food.  Contact wife [phone] Please call wife with follow up

## 2015-02-19 NOTE — Discharge Instructions (Signed)
Continue to use the tube feed.  If there is increasing redness, fever, increasing pain, difficulty using the tube, or there is other concerns return to the emergency department. Follow up with your GI doctor in the next day or 2. I strongly advise using a barrier cream or ointment around the site such as diaper cream. Gastrostomy Tube Home Guide, Adult A gastrostomy tube is a tube that is surgically placed into the stomach. It is also called a "G-tube." G-tubes are used when a person is unable to eat and drink enough on their own to stay healthy. The tube is inserted into the stomach through a small cut (incision) in the skin. This tube is used for:  Feeding.  Giving medication. GASTROSTOMY TUBE CARE  Wash your hands with soap and water.  Remove the old dressing (if any). Some styles of G-tubes may need a dressing inserted between the skin and the G-tube. Other types of G-tubes do not require a dressing. Ask your health care provider if a dressing is needed.  Check the area where the tube enters the skin (insertion site) for redness, swelling, or pus-like (purulent) drainage. A small amount of clear or tan liquid drainage is normal. Check to make sure scar tissue (skin) is not growing around the insertion site. This could have a raised, bumpy appearance.  A cotton swab can be used to clean the skin around the tube:  When the G-tube is first put in, a normal saline solution or water can be used to clean the skin.  Mild soap and warm water can be used when the skin around the G-tube site has healed.  Roll the cotton swab around the G-tube insertion site to remove any drainage or crusting at the insertion site. STOMACH RESIDUALS Feeding tube residuals are the amount of liquids that are in the stomach at any given time. Residuals may be checked before giving feedings, medications, or as instructed by your health care provider.  Ask your health care provider if there are instances when you would  not start tube feedings depending on the amount or type of contents withdrawn from the stomach.  Check residuals by attaching a syringe to the G-tube and pulling back on the syringe plunger. Note the amount, and return the residual back into the stomach. FLUSHING THE G-TUBE  The G-tube should be periodically flushed with clean warm water to keep it from clogging.  Flush the G-tube after feedings or medications. Draw up 30 mL of warm water in a syringe. Connect the syringe to the G-tube and slowly push the water into the tube.  Do not push feedings, medications, or flushes rapidly. Flush the G-tube gently and slowly.  Only use syringes made for G-tubes to flush medications or feedings.  Your health care provider may want the G-tube flushed more often or with more water. If this is the case, follow your health care provider's instructions. FEEDINGS Your health care provider will determine whether feedings are given as a bolus (a certain amount given at one time and at scheduled times) or whether feedings will be given continuously on a feeding pump.   Formulas should be given at room temperature.  If feedings are continuous, no more than 4 hours worth of feedings should be placed in the feeding bag. This helps prevent spoilage or accidental excess infusion.  Cover and place unused formula in the refrigerator.  If feedings are continuous, stop the feedings when medications or flushes are given. Be sure to restart the  feedings.  Feeding bags and syringes should be replaced as instructed by your health care provider. GIVING MEDICATION   In general, it is best if all medications are in a liquid form for G-tube administration. Liquid medications are less likely to clog the G-tube.  Mix the liquid medication with 30 mL (or amount recommended by your health care provider) of warm water.  Draw up the medication into the syringe.  Attach the syringe to the G-tube and slowly push the mixture  into the G-tube.  After giving the medication, draw up 30 mL of warm water in the syringe and slowly flush the G-tube.  For pills or capsules, check with your health care provider first before crushing medications. Some pills are not effective if they are crushed. Some capsules are sustained-release medications.  If appropriate, crush the pill or capsule and mix with 30 mL of warm water. Using the syringe, slowly push the medication through the tube, then flush the tube with another 30 mL of tap water. G-TUBE PROBLEMS G-tube was pulled out.  Cause: May have been pulled out accidentally.  Solutions: Cover the opening with clean dressing and tape. Call your health care provider right away. The G-tube should be put in as soon as possible (within 4 hours) so the G-tube opening (tract) does not close. The G-tube needs to be put in at a health care setting. An X-ray needs to be done to confirm placement before the G-tube can be used again. Redness, irritation, soreness, or foul odor around the gastrostomy site.  Cause: May be caused by leakage or infection.  Solutions: Call your health care provider right away. Large amount of leakage of fluid or mucus-like liquid present (a large amount means it soaks clothing).  Cause: Many reasons could cause the G-tube to leak.  Solutions: Call your health care provider to discuss the amount of leakage. Skin or scar tissue appears to be growing where tube enters skin.   Cause: Tissue growth may develop around the insertion site if the G-tube is moved or pulled on excessively.  Solutions: Secure tube with tape so that excess movement does not occur. Call your health care provider. G-tube is clogged.  Cause: Thick formula or medication.  Solutions: Try to slowly push warm water into the tube with a large syringe. Never try to push any object into the tube to unclog it. Do not force fluid into the G-tube. If you are unable to unclog the tube, call your  health care provider right away. TIPS  Head of bed (HOB) position refers to the upright position of a person's upper body.  When giving medications or a feeding bolus, keep the [facility] up as told by your health care provider. Do this during the feeding and for 1 hour after the feeding or medication administration.  If continuous feedings are being given, it is best to keep the [facility] up as told by your health care provider. When ADLs (activities of daily living) are performed and the [facility] needs to be flat, be sure to turn the feeding pump off. Restart the feeding pump when the [facility] is returned to the recommended height.  Do not pull or put tension on the tube.  To prevent fluid backflow, kink the G-tube before removing the cap or disconnecting a syringe.  Check the G-tube length every day. Measure from the insertion site to the end of the G-tube. If the length is longer than previous measurements, the tube may be coming out. Call your  health care provider if you notice increasing G-tube length.  Oral care, such as brushing teeth, must be continued.  You may need to remove excess air (vent) from the G-tube. Your health care provider will tell you if this is needed.  Always call your health care provider if you have questions or problems with the G-tube. SEEK IMMEDIATE MEDICAL CARE IF:   You have severe abdominal pain, tenderness, or abdominal bloating (distension).  You have nausea or vomiting.  You are constipated or have problems moving your bowels.  The G-tube insertion site is red, swollen, has a foul smell, or has yellow or brown drainage.  You have difficulty breathing or shortness of breath.  You have a fever.  You have a large amount of feeding tube residuals.  The G-tube is clogged and cannot be flushed. MAKE SURE YOU:   Understand these instructions.  Will watch your condition.  Will get help right away if you are not doing well or get worse.   This information is not  intended to replace advice given to you by your health care provider. Make sure you discuss any questions you have with your health care provider.   Document Released: 03/24/2001 Document Revised: 05/30/2014 Document Reviewed: 09/20/2012 Elsevier Interactive Patient Education [employer].

## 2015-02-19 NOTE — ED Provider Notes (Addendum)
[facility] Emergency Department Provider Note  ____________________________________________   I have reviewed the triage vital signs and the nursing notes.   HISTORY  Chief Complaint It looks a little red   HPI Johnny Sanford is a 75 y.o. male recently discharged from the hospital after very, complicated stay including aspirated stomach contents which resulted in a G-tube being placed on January 13. The patient has been doing well since that time. He just finished antibiotics for his pneumonitis, but there was some concern about a small amount of redness around the G-tube site. He's had no fever no vomiting or diarrhea he is making normal bowel movements. He is not complaining of abdominal pain. History is somewhat limited from patient, he is apparently at his baseline she states. His wife resides most of the history. They're concerned about this erythema around the G-tube site so they contacted the GI clinic but were told that they were not able to be seen so she comes to the emergency room. After then waving to wait in the emergency room for prolonged. At time, patient was brought back with the above complaint. The patient does state that there has been a little bit of residual feed coming back around the tube and not is a he believes is causing the redness to occur. Over there is no complaint of significant drainage or difficulty and introducing the Jevity.  Past Medical History  Diagnosis Date  . HLD (hyperlipidemia)   . HTN (hypertension)   . GERD (gastroesophageal reflux disease)   . Hypothyroidism   . Cancer ([place]) 1991    sqauamous cell ca  . CVA (cerebral vascular accident) ([place])   . DA (degenerative arthritis)   . RA (rheumatoid arthritis) ([place])   . Adrenal insufficiency ([place])   . Cervical spinal stenosis   . Falls   . Collagen vascular disease [facility])     Patient Active Problem List   Diagnosis Date Noted  . Acute respiratory failure with hypoxia  ([place]) 02/12/2015  . Aspiration pneumonitis ([place]) 02/12/2015  . Orthostatic hypotension 02/12/2015  . Leukopenia 02/12/2015  . Anemia 02/12/2015  . Generalized weakness 02/12/2015  . S/P percutaneous endoscopic gastrostomy (PEG) tube placement ([place]) 02/12/2015  . COPD exacerbation ([place]) 02/12/2015  . Dysphagia 02/02/2015  . Spinal stenosis in cervical region 01/08/2015  . Recurrent falls 06/29/2014  . Allergic rhinitis 05/04/2014  . 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 CAD (coronary artery disease) 04/13/2014  . Multiple lung nodules on CT 04/13/2014  . Atherosclerosis of aorta ([place]) 04/13/2014  . Cough 03/31/2014  . Medicare annual wellness visit, subsequent 12/09/2013  . Anxiety state 05/10/2013  . Chronic neck pain 11/24/2012  . Abnormal CT scan, chest 06/03/2012  . Labile hypertension 05/20/2012  . Memory loss 03/31/2012  . Adrenal insufficiency ([place]) 01/20/2012  . Hyponatremia 12/24/2011  . Fatigue 10/28/2011  . Essential thrombocythemia ([place]) 06/13/2011  . Low back pain 05/19/2011  . Cardiomegaly 04/02/2011  . COPD (chronic obstructive pulmonary disease) ([place]) 04/02/2011  . Rheumatoid arthritis(714.0) 03/11/2011  . Insomnia 02/17/2011  . Arthralgia 02/17/2011  . PAD (peripheral artery disease) ([place]) 08/30/2009  . Carotid artery stenosis 06/10/2009  . Hyperlipidemia 06/08/2009    Past Surgical History  Procedure Laterality Date  . Carotid endarterectomy      left  . Skin lesion excision      Dr. Sarajane Jews, and Dr. Vickki Muff at [facility]  . Peg placement N/A 02/09/2015    Procedure: PERCUTANEOUS ENDOSCOPIC GASTROSTOMY (  PEG) PLACEMENT;  Surgeon: Hulen Luster, MD;  Location: [facility] ENDOSCOPY;  Service: Endoscopy;  Laterality: N/A;    Current Outpatient Rx  Name  Route  Sig  Dispense  Refill  . aspirin 81 MG EC tablet   Oral   Take 162 mg by mouth daily.          . clonazePAM (KLONOPIN) 0.5 MG tablet   Oral   Take 1 tablet (0.5 mg total) by mouth 3 (three)  times daily as needed for anxiety.   90 tablet   3   . escitalopram (LEXAPRO) 20 MG tablet   Oral   Take 20 mg by mouth daily.         . fludrocortisone (FLORINEF) 0.1 MG tablet   Oral   Take 1 tablet (0.1 mg total) by mouth daily.   30 tablet   5   . fluorouracil (EFUDEX) 5 % cream   Topical   Apply 1 application topically as needed.         . fluticasone (FLONASE) 50 MCG/ACT nasal spray   Each Nare   Place 1 spray into both nostrils daily as needed for rhinitis.   16 g   6   . Fluticasone Furoate-Vilanterol (BREO ELLIPTA) 200-25 MCG/INH AEPB   Inhalation   Inhale 200 mcg into the lungs daily.   1 each   6   . HYDROcodone-acetaminophen (NORCO/VICODIN) 5-325 MG tablet   Per Tube   Place 1 tablet into feeding tube every 4 (four) hours as needed for moderate pain.   30 tablet   0   . hydroxyurea (HYDREA) 100 mg/mL SUSP   Per Tube   Place 5 mLs (500 mg total) into feeding tube every 8 (eight) hours.   450 mL   6   . hydroxyurea (HYDREA) 500 MG capsule   Oral   Take 1 capsule (500 mg total) by mouth 3 (three) times daily with meals.   90 capsule   6   . ipratropium-albuterol (DUONEB) 0.5-2.5 (3) MG/3ML SOLN   Nebulization   Take 3 mLs by nebulization every 4 (four) hours as needed.   360 mL   6   . levofloxacin (LEVAQUIN) 500 MG tablet   Oral   Take 1 tablet (500 mg total) by mouth daily.   6 tablet   0   . levothyroxine (SYNTHROID, LEVOTHROID) 137 MCG tablet   Oral   Take 1 tablet (137 mcg total) by mouth daily.   90 tablet   3   . meclizine (ANTIVERT) 25 MG tablet   Oral   Take 25 mg by mouth 2 (two) times daily as needed for dizziness.          . mometasone (ELOCON) 0.1 % lotion   Topical   Apply 1 application topically as needed (for irritation).          . Nutritional Supplements (FEEDING SUPPLEMENT, JEVITY 1.5 CAL/FIBER,) LIQD   Per Tube   Place 360 mLs into feeding tube 4 (four) times daily.   120 mL   6   . nystatin  (MYCOSTATIN) 100000 UNIT/ML suspension   Oral   Take 5 mLs by mouth as needed (for thrush).          . Omega-3 Fatty Acids (FISH OIL) 1000 MG CAPS   Oral   Take 1,000 mg by mouth daily.         . ranitidine (ZANTAC) 150 MG tablet   Oral   Take 1  tablet (150 mg total) by mouth 2 (two) times daily.   60 tablet   6   . traZODone (DESYREL) 50 MG tablet   Oral   Take 0.5-1 tablets (25-50 mg total) by mouth at bedtime as needed for sleep.   30 tablet   2   . vitamin B-12 (CYANOCOBALAMIN) 1000 MCG tablet   Oral   Take 1,000 mcg by mouth daily.           Allergies Simvastatin  Family History  Problem Relation Age of Onset  . Heart disease Father     Social History Social History  Substance Use Topics  . Smoking status: Former Smoker -- 15 years    Types: Cigarettes    Quit date: 10/07/2001  . Smokeless tobacco: Never Used     Comment: Quit 13 years ago  . Alcohol Use: No    Review of Systems Constitutional: No fever/chills Eyes: No visual changes. ENT: No sore throat. No stiff neck no neck pain Cardiovascular: Denies chest pain. Respiratory: Denies shortness of breath. Gastrointestinal:   no vomiting.  No diarrhea.  No constipation. Genitourinary: Negative for dysuria. Musculoskeletal: Negative lower extremity swelling Skin: Negative for rash. Neurological: Negative for headaches, focal weakness or numbness. 10-point ROS otherwise negative.  ____________________________________________   PHYSICAL EXAM:  VITAL SIGNS: ED Triage Vitals  Enc Vitals Group     BP 02/19/15 1215 94/71 mmHg     Pulse Rate 02/19/15 1215 86     Resp 02/19/15 1215 20     Temp 02/19/15 1215 97.6 F (36.4 C)     Temp Source 02/19/15 1215 Oral     SpO2 02/19/15 1215 100 %     Weight 02/19/15 1215 148 lb (67.132 kg)     Height 02/19/15 1215 5\' 4"  (1.626 m)     Head Cir --      Peak Flow --      Pain Score 02/19/15 1220 7     Pain Loc --      Pain Edu? --      Excl. in [place]?  --     Constitutional: Alert and oriented. Well appearing and in no acute distress. Cardiovascular: Normal rate, regular rhythm. Grossly normal heart sounds.  Good peripheral circulation. Respiratory: Normal respiratory effort.  No retractions. Lungs CTAB. Abdominal: Soft and nontender. No distention. No guarding no rebound, tube site in the left abdomen is dry and intact. There is perhaps 2 x 1 7 m of mild erythema noted with no tenderness. No purulent drainage. There is slight amount of tube feed appearing material it does come out when I palpate. There is no evidence of mild obstruction. There is no induration or fluctuance. Musculoskeletal: No lower extremity tenderness. No joint effusions, no DVT signs strong distal pulses no edema.  Skin:  Skin is warm, dry and intact. No rash noted. Psychiatric: Mood and affect are normal. Speech and behavior are normal.  ____________________________________________   LABS (all labs ordered are listed, but only abnormal results are displayed)  Labs Reviewed  CBC - Abnormal; Notable for the following:    WBC 3.2 (*)    RBC 2.12 (*)    Hemoglobin 9.2 (*)    HCT 27.2 (*)    MCV 128.7 (*)    MCH 43.4 (*)    Platelets 596 (*)    All other components within normal limits  COMPREHENSIVE METABOLIC PANEL - Abnormal; Notable for the following:    Sodium 131 (*)  Chloride 89 (*)    Glucose, Bld 111 (*)    BUN 21 (*)    Creatinine, Ser 0.51 (*)    Calcium 8.4 (*)    Albumin 3.2 (*)    All other components within normal limits   ____________________________________________  EKG  I personally interpreted any EKGs ordered by me or triage  ____________________________________________  RADIOLOGY  I reviewed any imaging ordered by me or triage that were performed during my shift ____________________________________________   PROCEDURES  Procedure(s) performed: None  Critical Care performed:  None  ____________________________________________   INITIAL IMPRESSION / ASSESSMENT AND PLAN / ED COURSE  Pertinent labs & imaging results that were available during my care of the patient were reviewed by me and considered in my medical decision making (see chart for details).  Patient here for a check of his tube which was placed about 10 days ago. Apparently the physician's office and placed it could not see him today. There is some irritation around the area but it does not appear to be acutely infected he does not have a high white count he does not have a fever his vital signs are at baseline for him. We have paged GI to left them know about their patient and we will hopefully be able to discharge him. I have advised using Vaseline or a similar barrier around that area.  ----------------------------------------- 4:04 PM on 02/19/2015 -----------------------------------------  Discussed with Dr. Rayann Heman of GI who agrees with plan and discharge and outpatient follow-up. Family is eager to go and have asked for discharge. Since arrival. Return precautions and follow-up given and understood ____________________________________________   FINAL CLINICAL IMPRESSION(S) / ED DIAGNOSES  Final diagnoses:  None     Schuyler Amor, MD 02/19/15 [place], MD 02/19/15 [phone]

## 2015-02-19 NOTE — Telephone Encounter (Signed)
We will need to talk to the nurse about who recommended a feeding pump? What exactly did they recommend? Home feeds are typically given as bolus feeds.

## 2015-02-19 NOTE — Telephone Encounter (Signed)
Left a VM for the Patients wife to call me back to get clarity.

## 2015-02-20 ENCOUNTER — Ambulatory Visit (INDEPENDENT_AMBULATORY_CARE_PROVIDER_SITE_OTHER): Payer: Medicare Other | Admitting: Internal Medicine

## 2015-02-20 ENCOUNTER — Encounter: Payer: Self-pay | Admitting: Internal Medicine

## 2015-02-20 VITALS — BP 97/62 | HR 76 | Temp 98.2°F | Ht 64.0 in | Wt 144.4 lb

## 2015-02-20 DIAGNOSIS — E039 Hypothyroidism, unspecified: Secondary | ICD-10-CM | POA: Insufficient documentation

## 2015-02-20 DIAGNOSIS — J9601 Acute respiratory failure with hypoxia: Secondary | ICD-10-CM | POA: Diagnosis not present

## 2015-02-20 DIAGNOSIS — M6281 Muscle weakness (generalized): Secondary | ICD-10-CM | POA: Diagnosis not present

## 2015-02-20 DIAGNOSIS — I69991 Dysphagia following unspecified cerebrovascular disease: Secondary | ICD-10-CM | POA: Diagnosis not present

## 2015-02-20 DIAGNOSIS — Z931 Gastrostomy status: Secondary | ICD-10-CM | POA: Diagnosis not present

## 2015-02-20 DIAGNOSIS — J690 Pneumonitis due to inhalation of food and vomit: Secondary | ICD-10-CM

## 2015-02-20 DIAGNOSIS — E871 Hypo-osmolality and hyponatremia: Secondary | ICD-10-CM

## 2015-02-20 MED ORDER — FLUTICASONE FUROATE-VILANTEROL 200-25 MCG/INH IN AEPB: 200.0000 ug | INHALATION_SPRAY | Freq: Every day | RESPIRATORY_TRACT | Status: DC

## 2015-02-20 NOTE — Assessment & Plan Note (Signed)
Recent Na yesterday 131. Will continue to monitor. Follow up with Dr. Rolly Salter next week with repeat labs.

## 2015-02-20 NOTE — Patient Instructions (Signed)
Continue current medications. Call when you are low on Zantac, and we will change to liquid form.  Follow up with Dr. Rolly Salter, Dr. Pryor Ochoa and Dr. Candace Cruise as scheduled next week.  Follow up in 3 weeks.

## 2015-02-20 NOTE — Assessment & Plan Note (Signed)
We discussed that Jevity may interfere with absorption of Levothyroxine. Will repeat TSH in 6 weeks.

## 2015-02-20 NOTE — Progress Notes (Signed)
Pre visit review using our clinic review tool, if applicable. No additional management support is needed unless otherwise documented below in the visit note. 

## 2015-02-20 NOTE — Progress Notes (Signed)
Subjective:    Patient ID: Johnny Sanford, male    DOB: 1940/07/19, 75 y.o.   MRN: [id number]  HPI  [name] presents for hospital follow up.  ADMITTED: 02/02/2015 DISCHARGED: 02/12/2015  DIAGNOSIS: Hyponatremia, Generalized Weakness, Acute respiratory failure, Aspiration pneumonitis, s/p PEG tube placement.  Initially admitted after clinic labs showed low sodium and pt had generalized weakness. During admission, had acute aspiration event with respiratory arrest. EGD showed extrinsic compression in the esophagus.  Ultimately, opted to have PEG placed. Started feeds 1/14. Using Jevity 1.5. Feeding at 8am, 12noon, 4pm and 8pm.  Wife is giving bolus feeds at home.Tolerating well. Having regular BMs. Has follow up with GI Feb 6th. Breathing improved at home. Continues on supplemental oxygen. Completed course of Levaquin. Home health in place with [facility]. PT has started in home.  Wife reports she is feeling exhausted. She needs help at home. No local family. Some friends locally, but they cannot assist with care. She does not qualify for assistance through Medicaid.   Wt Readings from Last 3 Encounters:  02/20/15 144 lb 6 oz (65.488 kg)  02/19/15 148 lb (67.132 kg)  02/12/15 140 lb 11.2 oz (63.821 kg)   BP Readings from Last 3 Encounters:  02/20/15 97/62  02/19/15 136/84  02/12/15 100/64    Past Medical History  Diagnosis Date  . HLD (hyperlipidemia)   . HTN (hypertension)   . GERD (gastroesophageal reflux disease)   . Hypothyroidism   . Cancer ([place]) 1991    sqauamous cell ca  . CVA (cerebral vascular accident) ([place])   . DA (degenerative arthritis)   . RA (rheumatoid arthritis) ([place])   . Adrenal insufficiency ([place])   . Cervical spinal stenosis   . Falls   . Collagen vascular disease ([place])    Family History  Problem Relation Age of Onset  . Heart disease Father    Past Surgical History  Procedure Laterality Date  . Carotid endarterectomy      left  .  Skin lesion excision      Dr. Sarajane Jews, and Dr. Vickki Muff at [facility]  . Peg placement N/A 02/09/2015    Procedure: PERCUTANEOUS ENDOSCOPIC GASTROSTOMY (PEG) PLACEMENT;  Surgeon: Hulen Luster, MD;  Location: [facility] ENDOSCOPY;  Service: Endoscopy;  Laterality: N/A;   Social History   Social History  . Marital Status: Married    Spouse Name: N/A  . Number of Children: N/A  . Years of Education: N/A   Social History Main Topics  . Smoking status: Former Smoker -- 15 years    Types: Cigarettes    Quit date: 10/07/2001  . Smokeless tobacco: Never Used     Comment: Quit 13 years ago  . Alcohol Use: No  . Drug Use: No  . Sexual Activity: Not Asked   Other Topics Concern  . None   Social History Narrative   Married, retired, gets regular exercise.    Lives at home with wife. Has a walker to ambulate      Cell # [phone]    Review of Systems  Constitutional: Positive for fatigue. Negative for fever, chills, activity change, appetite change and unexpected weight change.  Eyes: Negative for visual disturbance.  Respiratory: Positive for cough (occasional improved). Negative for shortness of breath.   Cardiovascular: Negative for chest pain, palpitations and leg swelling.  Gastrointestinal: Positive for constipation (one episode.). Negative for nausea, vomiting, abdominal pain, diarrhea and abdominal distention.  Genitourinary: Negative for dysuria, urgency and difficulty urinating.  Musculoskeletal: Negative for arthralgias and gait problem.  Skin: Negative for color change and rash.  Neurological: Positive for weakness.  Hematological: Negative for adenopathy.  Psychiatric/Behavioral: Negative for sleep disturbance and dysphoric mood. The patient is not nervous/anxious.        Objective:    BP 97/62 mmHg  Pulse 76  Temp(Src) 98.2 F (36.8 C) (Oral)  Ht 5\' 4"  (1.626 m)  Wt 144 lb 6 oz (65.488 kg)  BMI 24.77 kg/m2  SpO2 98% Physical Exam  Constitutional: He is oriented to person,  place, and time. He appears well-developed and well-nourished. He has a sickly appearance (chronically ill appearing). No distress.  HENT:  Head: Normocephalic and atraumatic.  Right Ear: External ear normal.  Left Ear: External ear normal.  Nose: Nose normal.  Mouth/Throat: Oropharynx is clear and moist. No oropharyngeal exudate.  Eyes: Conjunctivae and EOM are normal. Pupils are equal, round, and reactive to light. Right eye exhibits no discharge. Left eye exhibits no discharge. No scleral icterus.  Neck: Normal range of motion. Neck supple. No tracheal deviation present. No thyromegaly present.  Cardiovascular: Normal rate, regular rhythm and normal heart sounds.  Exam reveals no gallop and no friction rub.   No murmur heard. Pulmonary/Chest: Effort normal and breath sounds normal. No accessory muscle usage. No tachypnea. No respiratory distress. He has no decreased breath sounds. He has no wheezes. He has no rhonchi. He has no rales. He exhibits no tenderness.  Abdominal: Soft. Normal appearance and bowel sounds are normal.    Musculoskeletal: Normal range of motion. He exhibits no edema.  Lymphadenopathy:    He has no cervical adenopathy.  Neurological: He is alert and oriented to person, place, and time. No cranial nerve deficit. Coordination normal.  Skin: Skin is warm and dry. No rash noted. He is not diaphoretic. No erythema. No pallor.  Psychiatric: He has a normal mood and affect. His behavior is normal. Judgment and thought content normal.          Assessment & Plan:   Problem List Items Addressed This Visit      Unprioritized   Aspiration pneumonitis (HCC)    Symptoms of cough improved. Continues on supplemental oxygen. Exam today much improved with only occasional crackle that clears with cough. Follow up recheck in 3 weeks.      Hyponatremia - Primary    Recent Na yesterday 131. Will continue to monitor. Follow up with Dr. Rolly Salter next week with repeat labs.        Hypothyroidism    We discussed that Jevity may interfere with absorption of Levothyroxine. Will repeat TSH in 6 weeks.      S/P percutaneous endoscopic gastrostomy (PEG) tube placement (HCC)    Continue Jevity feeds. Encouraged her to use A+D ointment round PEG site to protect skin. Continue home health assistance. Follow up with GI next week.          Return in about 3 weeks (around 03/13/2015) for Recheck.

## 2015-02-20 NOTE — Assessment & Plan Note (Signed)
Symptoms of cough improved. Continues on supplemental oxygen. Exam today much improved with only occasional crackle that clears with cough. Follow up recheck in 3 weeks.

## 2015-02-20 NOTE — Assessment & Plan Note (Signed)
Continue Jevity feeds. Encouraged her to use A+D ointment round PEG site to protect skin. Continue home health assistance. Follow up with GI next week.

## 2015-02-21 ENCOUNTER — Other Ambulatory Visit: Payer: Self-pay | Admitting: Internal Medicine

## 2015-02-21 ENCOUNTER — Telehealth: Payer: Self-pay | Admitting: *Deleted

## 2015-02-21 DIAGNOSIS — Z9181 History of falling: Secondary | ICD-10-CM | POA: Diagnosis not present

## 2015-02-21 DIAGNOSIS — I10 Essential (primary) hypertension: Secondary | ICD-10-CM | POA: Diagnosis not present

## 2015-02-21 DIAGNOSIS — J690 Pneumonitis due to inhalation of food and vomit: Secondary | ICD-10-CM | POA: Diagnosis not present

## 2015-02-21 DIAGNOSIS — E785 Hyperlipidemia, unspecified: Secondary | ICD-10-CM | POA: Diagnosis not present

## 2015-02-21 DIAGNOSIS — M4802 Spinal stenosis, cervical region: Secondary | ICD-10-CM | POA: Diagnosis not present

## 2015-02-21 DIAGNOSIS — R1310 Dysphagia, unspecified: Secondary | ICD-10-CM | POA: Diagnosis not present

## 2015-02-21 DIAGNOSIS — D649 Anemia, unspecified: Secondary | ICD-10-CM | POA: Diagnosis not present

## 2015-02-21 DIAGNOSIS — Z7982 Long term (current) use of aspirin: Secondary | ICD-10-CM | POA: Diagnosis not present

## 2015-02-21 DIAGNOSIS — E039 Hypothyroidism, unspecified: Secondary | ICD-10-CM | POA: Diagnosis not present

## 2015-02-21 DIAGNOSIS — Z431 Encounter for attention to gastrostomy: Secondary | ICD-10-CM | POA: Diagnosis not present

## 2015-02-21 DIAGNOSIS — M069 Rheumatoid arthritis, unspecified: Secondary | ICD-10-CM | POA: Diagnosis not present

## 2015-02-21 NOTE — Telephone Encounter (Signed)
Faxed again

## 2015-02-21 NOTE — Telephone Encounter (Signed)
Patient was seen in the office on 02/19/14, He will need a Rx [facility] feed [facility].  Fax [phone]. Patient stated Dr. Gilford Rile is aware of this Rx

## 2015-02-23 ENCOUNTER — Telehealth: Payer: Self-pay | Admitting: *Deleted

## 2015-02-23 DIAGNOSIS — M4802 Spinal stenosis, cervical region: Secondary | ICD-10-CM | POA: Diagnosis not present

## 2015-02-23 DIAGNOSIS — I10 Essential (primary) hypertension: Secondary | ICD-10-CM | POA: Diagnosis not present

## 2015-02-23 DIAGNOSIS — R1310 Dysphagia, unspecified: Secondary | ICD-10-CM | POA: Diagnosis not present

## 2015-02-23 DIAGNOSIS — E785 Hyperlipidemia, unspecified: Secondary | ICD-10-CM | POA: Diagnosis not present

## 2015-02-23 DIAGNOSIS — J690 Pneumonitis due to inhalation of food and vomit: Secondary | ICD-10-CM | POA: Diagnosis not present

## 2015-02-23 DIAGNOSIS — Z7982 Long term (current) use of aspirin: Secondary | ICD-10-CM | POA: Diagnosis not present

## 2015-02-23 DIAGNOSIS — Z9181 History of falling: Secondary | ICD-10-CM | POA: Diagnosis not present

## 2015-02-23 DIAGNOSIS — M069 Rheumatoid arthritis, unspecified: Secondary | ICD-10-CM | POA: Diagnosis not present

## 2015-02-23 DIAGNOSIS — D649 Anemia, unspecified: Secondary | ICD-10-CM | POA: Diagnosis not present

## 2015-02-23 DIAGNOSIS — E039 Hypothyroidism, unspecified: Secondary | ICD-10-CM | POA: Diagnosis not present

## 2015-02-23 DIAGNOSIS — Z431 Encounter for attention to gastrostomy: Secondary | ICD-10-CM | POA: Diagnosis not present

## 2015-02-23 NOTE — Telephone Encounter (Signed)
Asking for orders to draw plt count at home per request of wife. Per Dr Grayland Ormond since he just had one drawn 1/23, he does not need it repeated at this time

## 2015-02-27 DIAGNOSIS — J690 Pneumonitis due to inhalation of food and vomit: Secondary | ICD-10-CM | POA: Diagnosis not present

## 2015-02-27 DIAGNOSIS — I10 Essential (primary) hypertension: Secondary | ICD-10-CM | POA: Diagnosis not present

## 2015-02-27 DIAGNOSIS — M4802 Spinal stenosis, cervical region: Secondary | ICD-10-CM | POA: Diagnosis not present

## 2015-02-27 DIAGNOSIS — Z7982 Long term (current) use of aspirin: Secondary | ICD-10-CM | POA: Diagnosis not present

## 2015-02-27 DIAGNOSIS — E785 Hyperlipidemia, unspecified: Secondary | ICD-10-CM | POA: Diagnosis not present

## 2015-02-27 DIAGNOSIS — M069 Rheumatoid arthritis, unspecified: Secondary | ICD-10-CM | POA: Diagnosis not present

## 2015-02-27 DIAGNOSIS — Z431 Encounter for attention to gastrostomy: Secondary | ICD-10-CM | POA: Diagnosis not present

## 2015-02-27 DIAGNOSIS — E039 Hypothyroidism, unspecified: Secondary | ICD-10-CM | POA: Diagnosis not present

## 2015-02-27 DIAGNOSIS — Z9181 History of falling: Secondary | ICD-10-CM | POA: Diagnosis not present

## 2015-02-27 DIAGNOSIS — R1310 Dysphagia, unspecified: Secondary | ICD-10-CM | POA: Diagnosis not present

## 2015-02-27 DIAGNOSIS — D649 Anemia, unspecified: Secondary | ICD-10-CM | POA: Diagnosis not present

## 2015-02-28 ENCOUNTER — Telehealth: Payer: Self-pay

## 2015-02-28 ENCOUNTER — Encounter: Payer: Self-pay | Admitting: *Deleted

## 2015-02-28 ENCOUNTER — Emergency Department
Admission: EM | Admit: 2015-02-28 | Discharge: 2015-02-28 | Disposition: A | Discharge status: Home / Self Care | Payer: Medicare Other | Attending: Emergency Medicine | Admitting: Emergency Medicine

## 2015-02-28 DIAGNOSIS — Z87891 Personal history of nicotine dependence: Secondary | ICD-10-CM | POA: Diagnosis not present

## 2015-02-28 DIAGNOSIS — E785 Hyperlipidemia, unspecified: Secondary | ICD-10-CM | POA: Diagnosis not present

## 2015-02-28 DIAGNOSIS — D473 Essential (hemorrhagic) thrombocythemia: Secondary | ICD-10-CM | POA: Insufficient documentation

## 2015-02-28 DIAGNOSIS — I10 Essential (primary) hypertension: Secondary | ICD-10-CM | POA: Insufficient documentation

## 2015-02-28 DIAGNOSIS — Z79899 Other long term (current) drug therapy: Secondary | ICD-10-CM | POA: Diagnosis not present

## 2015-02-28 DIAGNOSIS — Z7952 Long term (current) use of systemic steroids: Secondary | ICD-10-CM | POA: Insufficient documentation

## 2015-02-28 DIAGNOSIS — Z7982 Long term (current) use of aspirin: Secondary | ICD-10-CM | POA: Diagnosis not present

## 2015-02-28 DIAGNOSIS — E871 Hypo-osmolality and hyponatremia: Secondary | ICD-10-CM | POA: Diagnosis not present

## 2015-02-28 DIAGNOSIS — Z7951 Long term (current) use of inhaled steroids: Secondary | ICD-10-CM | POA: Insufficient documentation

## 2015-02-28 DIAGNOSIS — D649 Anemia, unspecified: Secondary | ICD-10-CM | POA: Diagnosis not present

## 2015-02-28 DIAGNOSIS — M4802 Spinal stenosis, cervical region: Secondary | ICD-10-CM | POA: Diagnosis not present

## 2015-02-28 DIAGNOSIS — R531 Weakness: Secondary | ICD-10-CM | POA: Diagnosis not present

## 2015-02-28 DIAGNOSIS — Z431 Encounter for attention to gastrostomy: Secondary | ICD-10-CM | POA: Diagnosis not present

## 2015-02-28 DIAGNOSIS — R1310 Dysphagia, unspecified: Secondary | ICD-10-CM | POA: Diagnosis not present

## 2015-02-28 DIAGNOSIS — M069 Rheumatoid arthritis, unspecified: Secondary | ICD-10-CM | POA: Diagnosis not present

## 2015-02-28 DIAGNOSIS — Z9181 History of falling: Secondary | ICD-10-CM | POA: Diagnosis not present

## 2015-02-28 DIAGNOSIS — E039 Hypothyroidism, unspecified: Secondary | ICD-10-CM | POA: Diagnosis not present

## 2015-02-28 DIAGNOSIS — J690 Pneumonitis due to inhalation of food and vomit: Secondary | ICD-10-CM | POA: Diagnosis not present

## 2015-02-28 HISTORY — DX: Chronic obstructive pulmonary disease, unspecified: J44.9

## 2015-02-28 LAB — CBC
HEMATOCRIT: 27.1 % — AB (ref 40.0–52.0)
HEMOGLOBIN: 9.1 g/dL — AB (ref 13.0–18.0)
MCH: 43.6 pg — AB (ref 26.0–34.0)
MCHC: 33.6 g/dL (ref 32.0–36.0)
MCV: 129.7 fL — ABNORMAL HIGH (ref 80.0–100.0)
Platelets: 918 10*3/uL (ref 150–440)
RBC: 2.09 MIL/uL — AB (ref 4.40–5.90)
RDW: 14.7 % — ABNORMAL HIGH (ref 11.5–14.5)
WBC: 6.5 10*3/uL (ref 3.8–10.6)

## 2015-02-28 LAB — BASIC METABOLIC PANEL
ANION GAP: 3 — AB (ref 5–15)
Anion gap: 6 (ref 5–15)
BUN: 24 mg/dL — ABNORMAL HIGH (ref 6–20)
BUN: 22 mg/dL — ABNORMAL HIGH (ref 6–20)
CALCIUM: 8.5 mg/dL — AB (ref 8.9–10.3)
CALCIUM: 8.1 mg/dL — AB (ref 8.9–10.3)
CHLORIDE: 92 mmol/L — AB (ref 101–111)
CHLORIDE: 92 mmol/L — AB (ref 101–111)
CO2: 34 mmol/L — AB (ref 22–32)
CO2: 33 mmol/L — AB (ref 22–32)
CREATININE: 0.58 mg/dL — AB (ref 0.61–1.24)
Creatinine, Ser: 0.71 mg/dL (ref 0.61–1.24)
GFR calc Af Amer: >60 mL/min (ref >60)
GFR calc non Af Amer: >60 mL/min (ref >60)
GLUCOSE: 120 mg/dL — AB (ref 65–99)
Glucose, Bld: 120 mg/dL — ABNORMAL HIGH (ref 65–99)
POTASSIUM: 4.8 mmol/L (ref 3.5–5.1)
POTASSIUM: 4.2 mmol/L (ref 3.5–5.1)
SODIUM: 131 mmol/L — AB (ref 135–145)
Sodium: 129 mmol/L — ABNORMAL LOW (ref 135–145)

## 2015-02-28 MED ORDER — SODIUM CHLORIDE 0.9 % IV SOLN
Freq: Once | INTRAVENOUS | Status: AC
  Administered 2015-02-28: 19:00:00 via INTRAVENOUS

## 2015-02-28 NOTE — Telephone Encounter (Signed)
Notified pt. 

## 2015-02-28 NOTE — Telephone Encounter (Signed)
Yes, absolutely needs to go to the ED.

## 2015-02-28 NOTE — Telephone Encounter (Signed)
Johnny Sanford states pt had a fall today with no open injuries, b/p low 92/52, weakness. Lattie Haw thinks he may have low sodium in blood. Wants to know if he should go to the ED for evaluation. Please Advise

## 2015-02-28 NOTE — ED Notes (Signed)
Pt to triage via wheelchair.  Pt reports dizziness, recent falls, and hx of low sodium.  Pt had a feeding tube placed 2 weeks ago. Pt is on 2 liters oxygen.

## 2015-02-28 NOTE — Discharge Instructions (Signed)
Hyponatremia Hyponatremia is when the amount of salt (sodium) in your blood is too low. When sodium levels are low, your cells absorb extra water and they swell. The swelling happens throughout the body, but it mostly affects the brain. CAUSES This condition may be caused by:  Heart, kidney, or liver problems.  Thyroid problems.  Adrenal gland problems.  Metabolic conditions, such as syndrome of inappropriate antidiuretic hormone (SIADH).  Severe vomiting and diarrhea.  Certain medicines or illegal drugs.  Dehydration.  Drinking too much water.  Eating a diet that is low in sodium.  Large burns on your body.  Sweating. RISK FACTORS This condition is more likely to develop in people who:  Have long-term (chronic) kidney disease.  Have heart failure.  Have a medical condition that causes frequent or excessive diarrhea.  Have metabolic conditions, such as Addison disease or SIADH.  Take certain medicines that affect the sodium and fluid balance in the blood. Some of these medicine types include:  Diuretics.  NSAIDs.  Some opioid pain medicines.  Some antidepressants.  Some seizure prevention medicines. SYMPTOMS  Symptoms of this condition include:  Nausea and vomiting.  Confusion.  Lethargy.  Agitation.  Headache.  Seizures.  Unconsciousness.  Appetite loss.  Muscle weakness and cramping.  Feeling weak or light-headed.  Having a rapid heart rate.  Fainting, in severe cases. DIAGNOSIS This condition is diagnosed with a medical history and physical exam. You will also have other tests, including:  Blood tests.  Urine tests. TREATMENT Treatment for this condition depends on the cause. Treatment may include:  Fluids given through an IV tube that is inserted into one of your veins.  Medicines to correct the sodium imbalance. If medicines are causing the condition, the medicines will need to be adjusted.  Limiting water or fluid intake to  get the correct sodium balance. HOME CARE INSTRUCTIONS  Take medicines only as directed by your health care provider. Many medicines can make this condition worse. Talk with your health care provider about any medicines that you are currently taking.  Carefully follow a recommended diet as directed by your health care provider.  Carefully follow instructions from your health care provider about fluid restrictions.  Keep all follow-up visits as directed by your health care provider. This is important.  Do not drink alcohol. SEEK MEDICAL CARE IF:  You develop worsening nausea, fatigue, headache, confusion, or weakness.  Your symptoms go away and then return.  You have problems following the recommended diet. SEEK IMMEDIATE MEDICAL CARE IF:  You have a seizure.  You faint.  You have ongoing diarrhea or vomiting.   This information is not intended to replace advice given to you by your health care provider. Make sure you discuss any questions you have with your health care provider.   Document Released: 01/03/2002 Document Revised: 05/30/2014 Document Reviewed: 02/02/2014 Elsevier Interactive Patient Education 2016 [employer].   Please see your primary care doctor tomorrow as planned. Please also have them call Dr. Grayland Ormond tomorrow to discuss the elevated platelet counts. Today I discussed them with Dr. Imagene Gurney DAy. He felt that it is okay to just restarted hydroxyurea as long as you call Dr. Laverda Sorenson and an EGA and tomorrow. Since her blood pressure was so low today even though it got better after the IV saline I would ask your primary care doctor to call your endocrinologist as well. Please ask the endocrinologist that she should add little bit of extra salt to the fluid irrigating  through the G-tube. Please make sure you're getting the correct amount of water through your G-tube. Please return for any further problems.

## 2015-02-28 NOTE — ED Provider Notes (Signed)
[facility] Emergency Department Provider Note  ____________________________________________  Time seen: Approximately 10:08 PM  I have reviewed the triage vital signs and the nursing notes.   HISTORY  Chief Complaint Dizziness and Fall    HPI Johnny Sanford is a 75 y.o. male patient has a history of adrenal insufficiency and takes Florinef. Patient reports he also has a history of low sodium. Patient's been feeling weak and dizzy for the last few days. Further questioning patient's wife says she has not been giving him enough as much fluid as she is supposed to every day because she is worried about giving him too much. Patient was also getting sick nauseated from the hydroxyurea so she has not been giving him that either. Patient's blood pressure was low on arrival in the emergency room. Patient however reports his blood pressure varies from a high of 1:30 down to a low of 90 or perhaps lower. Patient received 1 L of normal saline and feels much better that he went up to 131 blood pressure went up to [id number] systolic. She is platelet count was 918,000. I discussed this with Dr. Malachy Moan Y the oncologist on-call. He feels as long as the patient is not having any symptoms of bleeding or clotting and is taking his aspirin once a day he can just restart his hydroxyurea. He advises calling Dr. Grayland Ormond the patient's usual oncologist in the morning. Patient has a follow-up with primary care tomorrow scheduled. I advised his wife to also talk to the endocrinologist and see if he advises adding any extra salt to the fluid she is supposed to be giving him.   Past Medical History  Diagnosis Date  . HLD (hyperlipidemia)   . HTN (hypertension)   . GERD (gastroesophageal reflux disease)   . Hypothyroidism   . Cancer ([place]) 1991    sqauamous cell ca  . CVA (cerebral vascular accident) ([place])   . DA (degenerative arthritis)   . RA (rheumatoid arthritis) ([place])   . Adrenal  insufficiency ([place])   . Cervical spinal stenosis   . Falls   . Collagen vascular disease ([place])   . COPD (chronic obstructive pulmonary disease) ([place])     mild    Patient Active Problem List   Diagnosis Date Noted  . Hypothyroidism 02/20/2015  . Acute respiratory failure with hypoxia ([place]) 02/12/2015  . Aspiration pneumonitis ([place]) 02/12/2015  . Orthostatic hypotension 02/12/2015  . Leukopenia 02/12/2015  . Anemia 02/12/2015  . Generalized weakness 02/12/2015  . S/P percutaneous endoscopic gastrostomy (PEG) tube placement ([place]) 02/12/2015  . COPD exacerbation ([place]) 02/12/2015  . Dysphagia 02/02/2015  . Spinal stenosis in cervical region 01/08/2015  . Recurrent falls 06/29/2014  . Allergic rhinitis 05/04/2014  . 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 CAD (coronary artery disease) 04/13/2014  . Multiple lung nodules on CT 04/13/2014  . Atherosclerosis of aorta ([place]) 04/13/2014  . Cough 03/31/2014  . Medicare annual wellness visit, subsequent 12/09/2013  . Anxiety state 05/10/2013  . Chronic neck pain 11/24/2012  . Abnormal CT scan, chest 06/03/2012  . Labile hypertension 05/20/2012  . Memory loss 03/31/2012  . Adrenal insufficiency ([place]) 01/20/2012  . Hyponatremia 12/24/2011  . Fatigue 10/28/2011  . Essential thrombocythemia ([place]) 06/13/2011  . Low back pain 05/19/2011  . Cardiomegaly 04/02/2011  . COPD (chronic obstructive pulmonary disease) ([place]) 04/02/2011  . Rheumatoid arthritis(714.0) 03/11/2011  . Insomnia 02/17/2011  . Arthralgia 02/17/2011  . PAD (peripheral artery disease) ([place]) 08/30/2009  .  Carotid artery stenosis 06/10/2009  . Hyperlipidemia 06/08/2009    Past Surgical History  Procedure Laterality Date  . Carotid endarterectomy      left  . Skin lesion excision      Dr. Sarajane Jews, and Dr. Vickki Muff at [facility]  . Peg placement N/A 02/09/2015    Procedure: PERCUTANEOUS ENDOSCOPIC GASTROSTOMY (PEG) PLACEMENT;  Surgeon: Hulen Luster, MD;  Location: [facility]  ENDOSCOPY;  Service: Endoscopy;  Laterality: N/A;    Current Outpatient Rx  Name  Route  Sig  Dispense  Refill  . aspirin 81 MG EC tablet   Oral   Take 162 mg by mouth daily.          . clonazePAM (KLONOPIN) 0.5 MG tablet   Oral   Take 1 tablet (0.5 mg total) by mouth 3 (three) times daily as needed for anxiety.   90 tablet   3   . escitalopram (LEXAPRO) 20 MG tablet   Oral   Take 20 mg by mouth daily.         . fludrocortisone (FLORINEF) 0.1 MG tablet   Oral   Take 1 tablet (0.1 mg total) by mouth daily.   30 tablet   5   . fluorouracil (EFUDEX) 5 % cream   Topical   Apply 1 application topically as needed.         . fluticasone (FLONASE) 50 MCG/ACT nasal spray   Each Nare   Place 1 spray into both nostrils daily as needed for rhinitis.   16 g   6   . Fluticasone Furoate-Vilanterol (BREO ELLIPTA) 200-25 MCG/INH AEPB   Inhalation   Inhale 200 mcg into the lungs daily.   1 each   6   . HYDROcodone-acetaminophen (NORCO/VICODIN) 5-325 MG tablet   Per Tube   Place 1 tablet into feeding tube every 4 (four) hours as needed for moderate pain.   30 tablet   0   . hydroxyurea (HYDREA) 100 mg/mL SUSP   Per Tube   Place 5 mLs (500 mg total) into feeding tube every 8 (eight) hours.   450 mL   6   . ipratropium-albuterol (DUONEB) 0.5-2.5 (3) MG/3ML SOLN   Nebulization   Take 3 mLs by nebulization every 4 (four) hours as needed.   360 mL   6   . levothyroxine (SYNTHROID, LEVOTHROID) 137 MCG tablet   Oral   Take 1 tablet (137 mcg total) by mouth daily.   90 tablet   3   . meclizine (ANTIVERT) 25 MG tablet   Oral   Take 25 mg by mouth 2 (two) times daily as needed for dizziness.          . mometasone (ELOCON) 0.1 % lotion   Topical   Apply 1 application topically as needed (for irritation).          . Nutritional Supplements (FEEDING SUPPLEMENT, JEVITY 1.5 CAL/FIBER,) LIQD   Per Tube   Place 360 mLs into feeding tube 4 (four) times daily.   120  mL   6   . nystatin (MYCOSTATIN) 100000 UNIT/ML suspension   Oral   Take 5 mLs by mouth as needed (for thrush).          . Omega-3 Fatty Acids (FISH OIL) 1000 MG CAPS   Oral   Take 1,000 mg by mouth daily.         . ranitidine (ZANTAC) 150 MG tablet   Oral   Take 1 tablet (150 mg total) by  mouth 2 (two) times daily.   60 tablet   6     Allergies Simvastatin  Family History  Problem Relation Age of Onset  . Heart disease Father     Social History Social History  Substance Use Topics  . Smoking status: Former Smoker -- 15 years    Types: Cigarettes    Quit date: 10/07/2001  . Smokeless tobacco: Never Used     Comment: Quit 13 years ago  . Alcohol Use: No    Review of Systems Constitutional: No fever/chills Eyes: No visual changes. ENT: No sore throat. Cardiovascular: Denies chest pain. Respiratory: Denies shortness of breath. Gastrointestinal: No abdominal pain.  No nausea, no vomiting.  No diarrhea.  No constipation. Genitourinary: Negative for dysuria. Musculoskeletal: Negative for back pain. Skin: Negative for rash. Neurological: Negative for headaches, focal weakness or numbness.  10-point ROS otherwise negative.  ____________________________________________   PHYSICAL EXAM:  VITAL SIGNS: ED Triage Vitals  Enc Vitals Group     BP 02/28/15 1746 72/43 mmHg     Pulse Rate 02/28/15 1746 88     Resp 02/28/15 1746 24     Temp 02/28/15 1746 98.7 F (37.1 C)     Temp Source 02/28/15 1746 Oral     SpO2 02/28/15 1746 93 %     Weight 02/28/15 1746 144 lb (65.318 kg)     Height 02/28/15 1746 5\' 5"  (1.651 m)     Head Cir --      Peak Flow --      Pain Score 02/28/15 1748 0     Pain Loc --      Pain Edu? --      Excl. in [place]? --     Constitutional: Alert and oriented. Well appearing and in no acute distress. Eyes: Conjunctivae are normal. PERRL. EOMI. Head: Atraumatic. Nose: No congestion/rhinnorhea. Mouth/Throat: Mucous membranes are moist.   Oropharynx non-erythematous. Neck: No stridor. Cardiovascular: Normal rate, regular rhythm. Grossly normal heart sounds.  Good peripheral circulation. Respiratory: Normal respiratory effort.  No retractions. Lungs CTAB. Gastrointestinal: Soft and nontender. No distention. No abdominal bruits. No CVA tenderness. Musculoskeletal: No lower extremity tenderness nor edema.  No joint effusions. Neurologic:  Normal speech and language. No gross focal neurologic deficits are appreciated. No gait instability. Skin:  Skin is warm, dry and intact. No rash noted. Psychiatric: Mood and affect are normal. Speech and behavior are normal.  ____________________________________________   LABS (all labs ordered are listed, but only abnormal results are displayed)  Labs Reviewed  BASIC METABOLIC PANEL - Abnormal; Notable for the following:    Sodium 129 (*)    Chloride 92 (*)    CO2 34 (*)    Glucose, Bld 120 (*)    BUN 24 (*)    Calcium 8.5 (*)    Anion gap 3 (*)    All other components within normal limits  CBC - Abnormal; Notable for the following:    RBC 2.09 (*)    Hemoglobin 9.1 (*)    HCT 27.1 (*)    MCV 129.7 (*)    MCH 43.6 (*)    RDW 14.7 (*)    Platelets 918 (*)    All other components within normal limits  BASIC METABOLIC PANEL - Abnormal; Notable for the following:    Sodium 131 (*)    Chloride 92 (*)    CO2 33 (*)    Glucose, Bld 120 (*)    BUN 22 (*)    Creatinine,  Ser 0.58 (*)    Calcium 8.1 (*)    All other components within normal limits   ____________________________________________  EKG   ____________________________________________  RADIOLOGY   ____________________________________________   PROCEDURES    ____________________________________________   INITIAL IMPRESSION / ASSESSMENT AND PLAN / ED COURSE  Pertinent labs & imaging results that were available during my care of the patient were reviewed by me and considered in my medical decision making  (see chart for details).   ____________________________________________   FINAL CLINICAL IMPRESSION(S) / ED DIAGNOSES  Final diagnoses:  Hyponatremia  thrombocytosis    Nena Polio, MD 02/28/15 2213

## 2015-03-01 ENCOUNTER — Telehealth: Payer: Self-pay | Admitting: Oncology

## 2015-03-01 DIAGNOSIS — E871 Hypo-osmolality and hyponatremia: Secondary | ICD-10-CM | POA: Diagnosis not present

## 2015-03-01 DIAGNOSIS — I959 Hypotension, unspecified: Secondary | ICD-10-CM | POA: Diagnosis not present

## 2015-03-01 DIAGNOSIS — D696 Thrombocytopenia, unspecified: Secondary | ICD-10-CM | POA: Diagnosis not present

## 2015-03-01 DIAGNOSIS — D641 Secondary sideroblastic anemia due to disease: Secondary | ICD-10-CM | POA: Diagnosis not present

## 2015-03-01 NOTE — Telephone Encounter (Signed)
She lvm this morning in [place] stating that she was asked to call the [facility] this morning about his platelet count. Please call: [phone]. Thanks.

## 2015-03-01 NOTE — Telephone Encounter (Signed)
Per Dr Grayland Ormond OK to take all 3 capsules at once and defers oral vs tube to Dr Derry Skill discretion.

## 2015-03-01 NOTE — Telephone Encounter (Signed)
I spoke with Dr Grayland Ormond  And he wants to know if the patinet is taking his med and if so, how much and how often. I spoke with [name] who said she stopped giving him his hydrea Friday because it mad ehim dizzy when she dissolved it and put it in his tube. He has a tube because he was aspirating, She asks if she can give him the pills to swallow and says that he can swallow the pills, I deferred this for her to call Dr walker and ask her. She reports that she started him back on the hydrea this morning after getting a call last night that his platelet count is high and she is giving it bid, but he should be taking it TID. She wants to know if he can take all 3 capsules once a day like he used to do.

## 2015-03-01 NOTE — Telephone Encounter (Signed)
I spoke with Dr Gilford Rile who states that he cannot take anything by mouth because of aspiration. I called Johnny Sanford back and informed her that it is fone to give all 3 capsules of Hydrea at once and since he feels it makes him dizzy to give at bedtime. SHe said so I will dissolve 3 capsules in water and put them in is tube. I answered yes

## 2015-03-01 NOTE — Telephone Encounter (Signed)
Forwarded to Dr. Finnegan.  

## 2015-03-02 ENCOUNTER — Inpatient Hospital Stay: Payer: Medicare Other

## 2015-03-02 ENCOUNTER — Ambulatory Visit: Payer: Medicare Other | Admitting: Internal Medicine

## 2015-03-02 ENCOUNTER — Inpatient Hospital Stay
Admission: EM | Admit: 2015-03-02 | Discharge: 2015-03-07 | DRG: 871 | Payer: Medicare Other | Attending: Internal Medicine | Admitting: Internal Medicine

## 2015-03-02 ENCOUNTER — Ambulatory Visit
Admission: RE | Admit: 2015-03-02 | Discharge: 2015-03-02 | Disposition: A | Discharge status: Home / Self Care | Payer: Medicare Other | Source: Ambulatory Visit | Attending: Otolaryngology | Admitting: Otolaryngology

## 2015-03-02 ENCOUNTER — Other Ambulatory Visit: Payer: Self-pay | Admitting: Otolaryngology

## 2015-03-02 ENCOUNTER — Other Ambulatory Visit: Payer: Medicare Other

## 2015-03-02 DIAGNOSIS — Z9889 Other specified postprocedural states: Secondary | ICD-10-CM | POA: Diagnosis not present

## 2015-03-02 DIAGNOSIS — Z923 Personal history of irradiation: Secondary | ICD-10-CM

## 2015-03-02 DIAGNOSIS — F329 Major depressive disorder, single episode, unspecified: Secondary | ICD-10-CM | POA: Diagnosis present

## 2015-03-02 DIAGNOSIS — M069 Rheumatoid arthritis, unspecified: Secondary | ICD-10-CM | POA: Diagnosis present

## 2015-03-02 DIAGNOSIS — F419 Anxiety disorder, unspecified: Secondary | ICD-10-CM | POA: Diagnosis not present

## 2015-03-02 DIAGNOSIS — Y95 Nosocomial condition: Secondary | ICD-10-CM | POA: Diagnosis present

## 2015-03-02 DIAGNOSIS — H6063 Unspecified chronic otitis externa, bilateral: Secondary | ICD-10-CM | POA: Diagnosis not present

## 2015-03-02 DIAGNOSIS — J690 Pneumonitis due to inhalation of food and vomit: Secondary | ICD-10-CM | POA: Insufficient documentation

## 2015-03-02 DIAGNOSIS — Z9221 Personal history of antineoplastic chemotherapy: Secondary | ICD-10-CM | POA: Diagnosis not present

## 2015-03-02 DIAGNOSIS — Z79899 Other long term (current) drug therapy: Secondary | ICD-10-CM | POA: Diagnosis not present

## 2015-03-02 DIAGNOSIS — J441 Chronic obstructive pulmonary disease with (acute) exacerbation: Secondary | ICD-10-CM | POA: Diagnosis present

## 2015-03-02 DIAGNOSIS — J181 Lobar pneumonia, unspecified organism: Secondary | ICD-10-CM | POA: Diagnosis not present

## 2015-03-02 DIAGNOSIS — J9601 Acute respiratory failure with hypoxia: Secondary | ICD-10-CM | POA: Diagnosis not present

## 2015-03-02 DIAGNOSIS — I248 Other forms of acute ischemic heart disease: Secondary | ICD-10-CM

## 2015-03-02 DIAGNOSIS — Z66 Do not resuscitate: Secondary | ICD-10-CM | POA: Diagnosis present

## 2015-03-02 DIAGNOSIS — I10 Essential (primary) hypertension: Secondary | ICD-10-CM | POA: Diagnosis present

## 2015-03-02 DIAGNOSIS — Z931 Gastrostomy status: Secondary | ICD-10-CM

## 2015-03-02 DIAGNOSIS — Z7951 Long term (current) use of inhaled steroids: Secondary | ICD-10-CM

## 2015-03-02 DIAGNOSIS — I214 Non-ST elevation (NSTEMI) myocardial infarction: Secondary | ICD-10-CM | POA: Diagnosis not present

## 2015-03-02 DIAGNOSIS — A419 Sepsis, unspecified organism: Secondary | ICD-10-CM | POA: Diagnosis not present

## 2015-03-02 DIAGNOSIS — E2740 Unspecified adrenocortical insufficiency: Secondary | ICD-10-CM | POA: Diagnosis present

## 2015-03-02 DIAGNOSIS — I4891 Unspecified atrial fibrillation: Secondary | ICD-10-CM | POA: Diagnosis present

## 2015-03-02 DIAGNOSIS — Z85818 Personal history of malignant neoplasm of other sites of lip, oral cavity, and pharynx: Secondary | ICD-10-CM | POA: Diagnosis not present

## 2015-03-02 DIAGNOSIS — R7989 Other specified abnormal findings of blood chemistry: Secondary | ICD-10-CM | POA: Diagnosis not present

## 2015-03-02 DIAGNOSIS — C07 Malignant neoplasm of parotid gland: Secondary | ICD-10-CM | POA: Diagnosis not present

## 2015-03-02 DIAGNOSIS — Z8249 Family history of ischemic heart disease and other diseases of the circulatory system: Secondary | ICD-10-CM | POA: Diagnosis not present

## 2015-03-02 DIAGNOSIS — R748 Abnormal levels of other serum enzymes: Secondary | ICD-10-CM | POA: Diagnosis not present

## 2015-03-02 DIAGNOSIS — J9589 Other postprocedural complications and disorders of respiratory system, not elsewhere classified: Secondary | ICD-10-CM | POA: Diagnosis not present

## 2015-03-02 DIAGNOSIS — I469 Cardiac arrest, cause unspecified: Secondary | ICD-10-CM | POA: Diagnosis not present

## 2015-03-02 DIAGNOSIS — Z8673 Personal history of transient ischemic attack (TIA), and cerebral infarction without residual deficits: Secondary | ICD-10-CM

## 2015-03-02 DIAGNOSIS — D473 Essential (hemorrhagic) thrombocythemia: Secondary | ICD-10-CM | POA: Diagnosis present

## 2015-03-02 DIAGNOSIS — Z9981 Dependence on supplemental oxygen: Secondary | ICD-10-CM | POA: Diagnosis not present

## 2015-03-02 DIAGNOSIS — Z888 Allergy status to other drugs, medicaments and biological substances status: Secondary | ICD-10-CM

## 2015-03-02 DIAGNOSIS — Z859 Personal history of malignant neoplasm, unspecified: Secondary | ICD-10-CM | POA: Diagnosis not present

## 2015-03-02 DIAGNOSIS — T17990A Other foreign object in respiratory tract, part unspecified in causing asphyxiation, initial encounter: Secondary | ICD-10-CM | POA: Diagnosis present

## 2015-03-02 DIAGNOSIS — M6281 Muscle weakness (generalized): Secondary | ICD-10-CM | POA: Diagnosis not present

## 2015-03-02 DIAGNOSIS — R918 Other nonspecific abnormal finding of lung field: Secondary | ICD-10-CM | POA: Insufficient documentation

## 2015-03-02 DIAGNOSIS — I259 Chronic ischemic heart disease, unspecified: Secondary | ICD-10-CM | POA: Diagnosis not present

## 2015-03-02 DIAGNOSIS — E039 Hypothyroidism, unspecified: Secondary | ICD-10-CM | POA: Diagnosis present

## 2015-03-02 DIAGNOSIS — D539 Nutritional anemia, unspecified: Secondary | ICD-10-CM | POA: Diagnosis present

## 2015-03-02 DIAGNOSIS — B9689 Other specified bacterial agents as the cause of diseases classified elsewhere: Secondary | ICD-10-CM | POA: Diagnosis present

## 2015-03-02 DIAGNOSIS — J9621 Acute and chronic respiratory failure with hypoxia: Secondary | ICD-10-CM | POA: Diagnosis present

## 2015-03-02 DIAGNOSIS — N289 Disorder of kidney and ureter, unspecified: Secondary | ICD-10-CM | POA: Diagnosis not present

## 2015-03-02 DIAGNOSIS — R1312 Dysphagia, oropharyngeal phase: Secondary | ICD-10-CM | POA: Diagnosis not present

## 2015-03-02 DIAGNOSIS — K219 Gastro-esophageal reflux disease without esophagitis: Secondary | ICD-10-CM | POA: Diagnosis present

## 2015-03-02 DIAGNOSIS — D649 Anemia, unspecified: Secondary | ICD-10-CM | POA: Insufficient documentation

## 2015-03-02 DIAGNOSIS — Z87891 Personal history of nicotine dependence: Secondary | ICD-10-CM | POA: Diagnosis not present

## 2015-03-02 DIAGNOSIS — Z7982 Long term (current) use of aspirin: Secondary | ICD-10-CM

## 2015-03-02 DIAGNOSIS — R110 Nausea: Secondary | ICD-10-CM | POA: Diagnosis not present

## 2015-03-02 DIAGNOSIS — Z85858 Personal history of malignant neoplasm of other endocrine glands: Secondary | ICD-10-CM | POA: Diagnosis not present

## 2015-03-02 DIAGNOSIS — Z978 Presence of other specified devices: Secondary | ICD-10-CM

## 2015-03-02 DIAGNOSIS — Z515 Encounter for palliative care: Secondary | ICD-10-CM | POA: Diagnosis present

## 2015-03-02 DIAGNOSIS — J189 Pneumonia, unspecified organism: Secondary | ICD-10-CM | POA: Diagnosis present

## 2015-03-02 DIAGNOSIS — E871 Hypo-osmolality and hyponatremia: Secondary | ICD-10-CM | POA: Diagnosis present

## 2015-03-02 DIAGNOSIS — R739 Hyperglycemia, unspecified: Secondary | ICD-10-CM | POA: Diagnosis present

## 2015-03-02 DIAGNOSIS — H6123 Impacted cerumen, bilateral: Secondary | ICD-10-CM | POA: Diagnosis not present

## 2015-03-02 DIAGNOSIS — R1310 Dysphagia, unspecified: Secondary | ICD-10-CM | POA: Diagnosis present

## 2015-03-02 DIAGNOSIS — R0602 Shortness of breath: Secondary | ICD-10-CM | POA: Diagnosis not present

## 2015-03-02 LAB — CBC WITH DIFFERENTIAL/PLATELET
BASOS ABS: 0 10*3/uL (ref 0–0.1)
Eosinophils Absolute: 0 10*3/uL (ref 0–0.7)
Eosinophils Relative: 0 %
HEMATOCRIT: 25.5 % — AB (ref 40.0–52.0)
Hemoglobin: 8.5 g/dL — ABNORMAL LOW (ref 13.0–18.0)
Lymphocytes Relative: 5 %
Lymphs Abs: 0.2 10*3/uL — ABNORMAL LOW (ref 1.0–3.6)
MCH: 42.6 pg — ABNORMAL HIGH (ref 26.0–34.0)
MCHC: 33.3 g/dL (ref 32.0–36.0)
MCV: 128 fL — ABNORMAL HIGH (ref 80.0–100.0)
MONO ABS: 1 10*3/uL (ref 0.2–1.0)
Monocytes Relative: 26 %
NEUTROS ABS: 2.7 10*3/uL (ref 1.4–6.5)
Neutrophils Relative %: 69 %
PLATELETS: 924 10*3/uL — AB (ref 150–440)
RBC: 1.99 MIL/uL — ABNORMAL LOW (ref 4.40–5.90)
RDW: 15.2 % — AB (ref 11.5–14.5)
WBC: 3.8 10*3/uL (ref 3.8–10.6)

## 2015-03-02 LAB — COMPREHENSIVE METABOLIC PANEL
ALT: 21 U/L (ref 17–63)
ANION GAP: 7 (ref 5–15)
AST: 19 U/L (ref 15–41)
Albumin: 3.1 g/dL — ABNORMAL LOW (ref 3.5–5.0)
Alkaline Phosphatase: 52 U/L (ref 38–126)
BILIRUBIN TOTAL: 0.5 mg/dL (ref 0.3–1.2)
BUN: 26 mg/dL — AB (ref 6–20)
CO2: 34 mmol/L — ABNORMAL HIGH (ref 22–32)
Calcium: 8.4 mg/dL — ABNORMAL LOW (ref 8.9–10.3)
Chloride: 90 mmol/L — ABNORMAL LOW (ref 101–111)
Creatinine, Ser: 0.55 mg/dL — ABNORMAL LOW (ref 0.61–1.24)
Glucose, Bld: 140 mg/dL — ABNORMAL HIGH (ref 65–99)
POTASSIUM: 4.2 mmol/L (ref 3.5–5.1)
Sodium: 131 mmol/L — ABNORMAL LOW (ref 135–145)
TOTAL PROTEIN: 7.1 g/dL (ref 6.5–8.1)

## 2015-03-02 LAB — URINALYSIS COMPLETE WITH MICROSCOPIC (ARMC ONLY)
BILIRUBIN URINE: NEGATIVE
GLUCOSE, UA: NEGATIVE mg/dL
Hgb urine dipstick: NEGATIVE
Leukocytes, UA: NEGATIVE
Nitrite: NEGATIVE
Protein, ur: 100 mg/dL — AB
Specific Gravity, Urine: 1.03 (ref 1.005–1.030)
Squamous Epithelial / LPF: NONE SEEN
pH: 6 (ref 5.0–8.0)

## 2015-03-02 LAB — PROTIME-INR
INR: 1.26
Prothrombin Time: 15.9 seconds — ABNORMAL HIGH (ref 11.4–15.0)

## 2015-03-02 LAB — TROPONIN I
TROPONIN I: 0.56 ng/mL — AB (ref <0.031)
Troponin I: 0.42 ng/mL — ABNORMAL HIGH (ref <0.031)

## 2015-03-02 LAB — LACTIC ACID, PLASMA
LACTIC ACID, VENOUS: 1.6 mmol/L (ref 0.5–2.0)
Lactic Acid, Venous: 1.5 mmol/L (ref 0.5–2.0)

## 2015-03-02 MED ORDER — LEVOTHYROXINE SODIUM 25 MCG PO TABS
137.0000 ug | ORAL_TABLET | Freq: Every day | ORAL | Status: DC
  Administered 2015-03-03 – 2015-03-05 (×3): 137 ug
  Filled 2015-03-02 (×3): qty 1

## 2015-03-02 MED ORDER — HEPARIN SODIUM (PORCINE) 5000 UNIT/ML IJ SOLN
5000.0000 [IU] | Freq: Three times a day (TID) | INTRAMUSCULAR | Status: DC
  Administered 2015-03-02 – 2015-03-05 (×8): 5000 [IU] via SUBCUTANEOUS
  Filled 2015-03-02 (×9): qty 1

## 2015-03-02 MED ORDER — VANCOMYCIN HCL IN DEXTROSE 1-5 GM/200ML-% IV SOLN
1000.0000 mg | Freq: Once | INTRAVENOUS | Status: AC
  Administered 2015-03-02: 1000 mg via INTRAVENOUS
  Filled 2015-03-02: qty 200

## 2015-03-02 MED ORDER — ONDANSETRON HCL 4 MG PO TABS: 4.0000 mg | ORAL_TABLET | Freq: Four times a day (QID) | ORAL | Status: DC | PRN

## 2015-03-02 MED ORDER — ALBUTEROL SULFATE (2.5 MG/3ML) 0.083% IN NEBU: 2.5000 mg | INHALATION_SOLUTION | RESPIRATORY_TRACT | Status: DC | PRN

## 2015-03-02 MED ORDER — JEVITY 1.2 CAL PO LIQD
1000.0000 mL | ORAL | Status: DC
Start: 2015-03-02 — End: 2015-03-03
  Administered 2015-03-03: 1000 mL
  Filled 2015-03-02 (×2): qty 1000

## 2015-03-02 MED ORDER — ACETAMINOPHEN 650 MG RE SUPP: 650.0000 mg | Freq: Four times a day (QID) | RECTAL | Status: DC | PRN

## 2015-03-02 MED ORDER — VANCOMYCIN HCL IN DEXTROSE 750-5 MG/150ML-% IV SOLN
750.0000 mg | Freq: Two times a day (BID) | INTRAVENOUS | Status: DC
  Administered 2015-03-02: 750 mg via INTRAVENOUS
  Filled 2015-03-02 (×3): qty 150

## 2015-03-02 MED ORDER — IPRATROPIUM-ALBUTEROL 0.5-2.5 (3) MG/3ML IN SOLN
3.0000 mL | Freq: Once | RESPIRATORY_TRACT | Status: AC
  Administered 2015-03-02: 3 mL via RESPIRATORY_TRACT

## 2015-03-02 MED ORDER — HYDROCORTISONE NA SUCCINATE PF 100 MG IJ SOLR
50.0000 mg | Freq: Once | INTRAMUSCULAR | Status: AC
  Administered 2015-03-02: 50 mg via INTRAVENOUS
  Filled 2015-03-02: qty 2

## 2015-03-02 MED ORDER — HYDROXYUREA 100 MG/ML ORAL SUSPENSION
1500.0000 mg | Freq: Every day | ORAL | Status: DC
  Administered 2015-03-02: 1500 mg
  Filled 2015-03-02 (×2): qty 15

## 2015-03-02 MED ORDER — SODIUM CHLORIDE 0.9 % IV SOLN: 250.0000 mL | INTRAVENOUS | Status: DC | PRN

## 2015-03-02 MED ORDER — ASPIRIN EC 81 MG PO TBEC
81.0000 mg | DELAYED_RELEASE_TABLET | Freq: Every day | ORAL | Status: DC
  Administered 2015-03-03 – 2015-03-04 (×2): 81 mg via ORAL
  Filled 2015-03-02 (×2): qty 1

## 2015-03-02 MED ORDER — DEXTROSE 5 % IV SOLN
2.0000 g | Freq: Two times a day (BID) | INTRAVENOUS | Status: DC
  Administered 2015-03-03: 2 g via INTRAVENOUS
  Filled 2015-03-02 (×2): qty 2

## 2015-03-02 MED ORDER — MOMETASONE FURO-FORMOTEROL FUM 200-5 MCG/ACT IN AERO
2.0000 | INHALATION_SPRAY | Freq: Two times a day (BID) | RESPIRATORY_TRACT | Status: DC
  Administered 2015-03-02 – 2015-03-04 (×4): 2 via RESPIRATORY_TRACT
  Filled 2015-03-02: qty 8.8

## 2015-03-02 MED ORDER — FLUTICASONE FUROATE-VILANTEROL 200-25 MCG/INH IN AEPB: 200.0000 ug | INHALATION_SPRAY | Freq: Every day | RESPIRATORY_TRACT | Status: DC

## 2015-03-02 MED ORDER — DEXTROSE 5 % IV SOLN
2.0000 g | Freq: Once | INTRAVENOUS | Status: AC
  Administered 2015-03-02: 2 g via INTRAVENOUS
  Filled 2015-03-02: qty 2

## 2015-03-02 MED ORDER — ASPIRIN 81 MG PO CHEW: 324.0000 mg | CHEWABLE_TABLET | Freq: Once | ORAL | Status: DC

## 2015-03-02 MED ORDER — FAMOTIDINE 20 MG PO TABS
20.0000 mg | ORAL_TABLET | Freq: Every day | ORAL | Status: DC
  Administered 2015-03-03 – 2015-03-04 (×2): 20 mg
  Filled 2015-03-02 (×2): qty 1

## 2015-03-02 MED ORDER — SODIUM CHLORIDE 0.9% FLUSH: 3.0000 mL | INTRAVENOUS | Status: DC | PRN

## 2015-03-02 MED ORDER — SODIUM CHLORIDE 0.9% FLUSH
3.0000 mL | Freq: Two times a day (BID) | INTRAVENOUS | Status: DC
  Administered 2015-03-02 – 2015-03-04 (×4): 3 mL via INTRAVENOUS

## 2015-03-02 MED ORDER — HYDROCODONE-ACETAMINOPHEN 5-325 MG PO TABS: 1.0000 | ORAL_TABLET | ORAL | Status: DC | PRN

## 2015-03-02 MED ORDER — CLONAZEPAM 0.5 MG PO TABS
0.5000 mg | ORAL_TABLET | Freq: Three times a day (TID) | ORAL | Status: DC | PRN
  Filled 2015-03-02: qty 1

## 2015-03-02 MED ORDER — FLUTICASONE PROPIONATE 50 MCG/ACT NA SUSP
1.0000 | Freq: Every day | NASAL | Status: DC | PRN
  Administered 2015-03-03: 1 via NASAL
  Filled 2015-03-02: qty 16

## 2015-03-02 MED ORDER — VANCOMYCIN HCL IN DEXTROSE 1-5 GM/200ML-% IV SOLN
1000.0000 mg | Freq: Once | INTRAVENOUS | Status: AC
  Administered 2015-03-03: 1000 mg via INTRAVENOUS
  Filled 2015-03-02: qty 200

## 2015-03-02 MED ORDER — DEXTROSE 5 % IV SOLN: 2.0000 g | Freq: Once | INTRAVENOUS | Status: DC

## 2015-03-02 MED ORDER — HYDROXYUREA 500 MG PO CAPS
1500.0000 mg | ORAL_CAPSULE | Freq: Every day | ORAL | Status: DC
  Filled 2015-03-02: qty 3

## 2015-03-02 MED ORDER — FLUDROCORTISONE ACETATE 0.1 MG PO TABS
0.1000 mg | ORAL_TABLET | Freq: Every day | ORAL | Status: DC
  Administered 2015-03-03: 0.1 mg
  Filled 2015-03-02 (×2): qty 1

## 2015-03-02 MED ORDER — ONDANSETRON HCL 4 MG/2ML IJ SOLN: 4.0000 mg | Freq: Four times a day (QID) | INTRAMUSCULAR | Status: DC | PRN

## 2015-03-02 MED ORDER — IPRATROPIUM-ALBUTEROL 0.5-2.5 (3) MG/3ML IN SOLN
RESPIRATORY_TRACT | Status: AC
  Administered 2015-03-02: 3 mL via RESPIRATORY_TRACT
  Filled 2015-03-02: qty 3

## 2015-03-02 MED ORDER — ESCITALOPRAM OXALATE 10 MG PO TABS
20.0000 mg | ORAL_TABLET | Freq: Every day | ORAL | Status: DC
  Administered 2015-03-03 – 2015-03-04 (×2): 20 mg
  Filled 2015-03-02 (×2): qty 2

## 2015-03-02 MED ORDER — SODIUM CHLORIDE 0.9 % IV BOLUS (SEPSIS)
1000.0000 mL | Freq: Once | INTRAVENOUS | Status: AC
  Administered 2015-03-02: 1000 mL via INTRAVENOUS

## 2015-03-02 MED ORDER — ACETAMINOPHEN 325 MG PO TABS: 650.0000 mg | ORAL_TABLET | Freq: Four times a day (QID) | ORAL | Status: DC | PRN

## 2015-03-02 NOTE — Consult Note (Addendum)
Pharmacy Antibiotic Note  Johnny Sanford is a 75 y.o. male admitted on 03/02/2015 with possible PNA/COPD exacerbation/sepsis.  Pharmacy has been consulted for vancomycin and cefepime dosing.  Plan: Vancomycin 750 IV every 12 hours.  Goal trough 15-20 mcg/mL. cefepime 2g q 12 hours Will start vancomycin 750mg  6 hours after intial 1g dose was given in ED Ke=0.062 Vd=44.1 T1/2=11hr  Will draw vanc trough prior to 5th dose 2/5 @ 2130.  Height: 5\' 4"  (162.6 cm) Weight: 139 lb (63.05 kg) IBW/kg (Calculated) : 59.2  Temp (24hrs), Avg:97.3 F (36.3 C), Min:97.3 F (36.3 C), Max:97.3 F (36.3 C)   Recent Labs Lab 02/28/15 1812 02/28/15 2100 03/02/15 1554 03/02/15 1556  WBC 6.5  --   --  3.8  CREATININE 0.71 0.58*  --  0.55*  LATICACIDVEN  --   --  1.6  --     Estimated Creatinine Clearance: 68.9 mL/min (by C-G formula based on Cr of 0.55).    Allergies  Allergen Reactions  . Simvastatin Anxiety    Antimicrobials this admission: vancomycin 2/3 >>  cefepime 2/3 >>   Dose adjustments this admission:   Microbiology results: 2/3 BCx: pending 2/3 UCx: pending    Thank you for allowing pharmacy to be a part of this patient's care.  Ramond Dial 03/02/2015 5:52 PM

## 2015-03-02 NOTE — ED Notes (Signed)
Pt arrived via EMS c/o SOB. Per EMS pt has been SOB all morning and had syncopal episode, denies hitting head. EMS stated  O2 stats in the 80s, and pt was placed on 2L, given 2 duo nebs, 4 mg of zofran, and 125 mg of solumedrol.

## 2015-03-02 NOTE — Progress Notes (Signed)
Contacted Dr. Bridgett Larsson to inform that pt is requesting feeding through his PEG tube, Dr. Lenon Ahmadi to "call dietician" explained to Dr. Parks Ranger there's no dietician available at this time. Dr. Lenon Ahmadi "I cannot help you right now, I have 3 more pt's to see"

## 2015-03-02 NOTE — H&P (Signed)
[place] [facility] NAME: Johnny Sanford    MR#:  [id number]  DATE OF BIRTH:  [birth year]  DATE OF ADMISSION:  03/02/2015  PRIMARY CARE PHYSICIAN: Rica Mast, MD   REQUESTING/REFERRING PHYSICIAN: Delman Kitten, MD  CHIEF COMPLAINT:   Chief Complaint  Patient presents with  . Shortness of Breath  cough and shortness of breath today  HISTORY OF PRESENT ILLNESS:  Johnny Sanford  is a 75 y.o. male with a known history of hypertension,throat cancer, COPD and CVA.the patient was sent from home to ED due to above chief complaint. He is on oxygen by nasal cannular for chronic respiratory failure and COPD. He also complains of fever and chills, and generalized weakness. His chest x-ray show pneumonia. He is treated with antibiotics in ED.  PAST MEDICAL HISTORY:   Past Medical History  Diagnosis Date  . HLD (hyperlipidemia)   . HTN (hypertension)   . GERD (gastroesophageal reflux disease)   . Hypothyroidism   . Cancer ([place]) 1991    sqauamous cell ca  . CVA (cerebral vascular accident) ([place])   . DA (degenerative arthritis)   . RA (rheumatoid arthritis) ([place])   . Adrenal insufficiency ([place])   . Cervical spinal stenosis   . Falls   . Collagen vascular disease ([place])   . COPD (chronic obstructive pulmonary disease) ([place])     mild    PAST SURGICAL HISTORY:   Past Surgical History  Procedure Laterality Date  . Carotid endarterectomy      left  . Skin lesion excision      Dr. Sarajane Jews, and Dr. Vickki Muff at [facility]  . Peg placement N/A 02/09/2015    Procedure: PERCUTANEOUS ENDOSCOPIC GASTROSTOMY (PEG) PLACEMENT;  Surgeon: Hulen Luster, MD;  Location: [facility] ENDOSCOPY;  Service: Endoscopy;  Laterality: N/A;    SOCIAL HISTORY:   Social History  Substance Use Topics  . Smoking status: Former Smoker -- 15 years    Types: Cigarettes    Quit date: 10/07/2001  . Smokeless tobacco: Never Used     Comment: Quit 13 years ago  . Alcohol Use:  No    FAMILY HISTORY:   Family History  Problem Relation Age of Onset  . Heart disease Father     DRUG ALLERGIES:   Allergies  Allergen Reactions  . Simvastatin Anxiety    REVIEW OF SYSTEMS:  CONSTITUTIONAL: has fever, generalized weakness.  EYES: No blurred or double vision.  EARS, NOSE, AND THROAT: No tinnitus or ear pain.  RESPIRATORY: has cough, shortness of breath, no wheezing or hemoptysis.  CARDIOVASCULAR: No chest pain, orthopnea, edema.  GASTROINTESTINAL: No nausea, vomiting, diarrhea or abdominal pain.  GENITOURINARY: No dysuria, hematuria.  ENDOCRINE: No polyuria, nocturia,  HEMATOLOGY: No anemia, easy bruising or bleeding SKIN: No rash or lesion. MUSCULOSKELETAL: No joint pain or arthritis.   NEUROLOGIC: No tingling, numbness, weakness.  PSYCHIATRY: No anxiety or depression.   MEDICATIONS AT HOME:   Prior to Admission medications   Medication Sig Start Date End Date Taking? Authorizing Provider  aspirin 81 MG EC tablet 162 mg by Feeding Tube route daily.    Yes Historical Provider, MD  clonazePAM (KLONOPIN) 0.5 MG tablet 0.5 mg by Feeding Tube route 3 (three) times daily as needed for anxiety.   Yes Historical Provider, MD  escitalopram (LEXAPRO) 20 MG tablet 20 mg by Feeding Tube route daily.    Yes Historical Provider, MD  fludrocortisone (FLORINEF) 0.1 MG tablet 0.1 mg by  Feeding Tube route daily.   Yes Historical Provider, MD  fluorouracil (EFUDEX) 5 % cream Apply 1 application topically as needed (for skin spots).    Yes Historical Provider, MD  fluticasone (FLONASE) 50 MCG/ACT nasal spray Place 1 spray into both nostrils daily as needed for rhinitis. 06/24/13  Yes Jackolyn Confer, MD  Fluticasone Furoate-Vilanterol (BREO ELLIPTA) 200-25 MCG/INH AEPB Inhale 200 mcg into the lungs daily. 02/20/15  Yes Jackolyn Confer, MD  HYDROcodone-acetaminophen (NORCO/VICODIN) 5-325 MG tablet Place 1 tablet into feeding tube every 4 (four) hours as needed for moderate  pain. 02/12/15  Yes Theodoro Grist, MD  hydroxyurea (HYDREA) 500 MG capsule 1,500 mg by Feeding Tube route at bedtime.   Yes Historical Provider, MD  ipratropium-albuterol (DUONEB) 0.5-2.5 (3) MG/3ML SOLN Take 3 mLs by nebulization every 4 (four) hours as needed. Patient taking differently: Take 3 mLs by nebulization every 4 (four) hours as needed (for wheezing/shortness of breath).  02/12/15  Yes Theodoro Grist, MD  levothyroxine (SYNTHROID, LEVOTHROID) 137 MCG tablet 137 mcg by Feeding Tube route daily before breakfast.   Yes Historical Provider, MD  Nutritional Supplements (FEEDING SUPPLEMENT, JEVITY 1.5 CAL/FIBER,) LIQD Place 360 mLs into feeding tube 4 (four) times daily. 02/12/15  Yes Theodoro Grist, MD  ranitidine (ZANTAC) 150 MG tablet 150 mg by Feeding Tube route 2 (two) times daily.   Yes Historical Provider, MD      VITAL SIGNS:  Blood pressure 140/94, pulse 96, temperature 98.1 F (36.7 C), temperature source Oral, resp. rate 33, height 5\' 4"  (1.626 m), weight 63.05 kg (139 lb), SpO2 100 %.  PHYSICAL EXAMINATION:  GENERAL:  75 y.o.-year-old patient lying in the bed with lethargy.  EYES: Pupils equal, round, reactive to light and accommodation. No scleral icterus. Extraocular muscles intact.  HEENT: Head atraumatic, normocephalic. Oropharynx and nasopharynx clear.  NECK:  Supple, no jugular venous distention. No thyroid enlargement, no tenderness.  LUNGS: Diminished breath sounds bilaterally, bilateral crackles. No use of accessory muscles of respiration.  CARDIOVASCULAR: S1, S2 normal. No murmurs, rubs, or gallops.  ABDOMEN: Soft, nontender, nondistended. Bowel sounds present. No organomegaly or mass.  EXTREMITIES: No pedal edema, cyanosis, or clubbing.  NEUROLOGIC: Cranial nerves II through XII are intact. Muscle strength 5/5 in all extremities. Sensation intact. Gait not checked.  PSYCHIATRIC: The patient is alert and oriented x 3.  SKIN: No obvious rash, lesion, or ulcer.    LABORATORY PANEL:   CBC  Recent Labs Lab 03/02/15 1556  WBC 3.8  HGB 8.5*  HCT 25.5*  PLT 924*   ------------------------------------------------------------------------------------------------------------------  Chemistries   Recent Labs Lab 03/02/15 1556  NA 131*  K 4.2  CL 90*  CO2 34*  GLUCOSE 140*  BUN 26*  CREATININE 0.55*  CALCIUM 8.4*  AST 19  ALT 21  ALKPHOS 52  BILITOT 0.5   ------------------------------------------------------------------------------------------------------------------  Cardiac Enzymes  Recent Labs Lab 03/02/15 1556  TROPONINI 0.56*   ------------------------------------------------------------------------------------------------------------------  RADIOLOGY:  Dg Chest 2 View  03/02/2015  CLINICAL DATA:  75 year old male with possible aspiration. EXAM: CHEST  2 VIEW COMPARISON:  None. FINDINGS: Mild cardiomegaly again noted scratch a mild cardiomegaly and peribronchial thickening again noted. Mildly increased left lower lobe opacity may represent atelectasis, aspiration or pneumonia. There is no evidence of pleural effusion, pneumothorax, edema or acute bony abnormality. IMPRESSION: Mild left lower lobe opacity which may represent atelectasis, or pneumonia. Radiographic follow-up to resolution is recommended. Electronically Signed   By: Margarette Canada M.D.   On: 03/02/2015  14:14   Dg Chest Port 1 View  03/02/2015  CLINICAL DATA:  Sepsis. EXAM: PORTABLE CHEST 1 VIEW COMPARISON:  Same day. FINDINGS: Stable cardiomegaly is noted. No pneumothorax is noted. Right lung is clear. Increased left basilar opacity is noted concerning for worsening pneumonia or atelectasis with possible associated pleural effusion. Bony thorax is unremarkable. IMPRESSION: Increased left basilar opacity concerning for worsening pneumonia or atelectasis with possible associated pleural effusion. Electronically Signed   By: Marijo Conception, M.D.   On: 03/02/2015 18:29     EKG:   Orders placed or performed during the hospital encounter of 03/02/15  . ED EKG 12-Lead  . ED EKG 12-Lead  . EKG 12-Lead  . EKG 12-Lead    IMPRESSION AND PLAN:   Pneumonia (HAP) with Sepsis The patient was recently admitted To the hospital.I will start cefepime and vancomycin, follow-up blood culture and sputum culture. Give DuoNeb when ary and continue oxygen by nasal cannular.   acute on chronic respiratory failure with hypoxia, COPD. Continue home oxygen and nebulizer treatment.  Elevated troponin. Follow-up troponin level and continue ASA. Cardiology consult.  Chronic hyponatremia. Stable.  All the records are reviewed and case discussed with ED provider. Management plans discussed with the patient, wife and sister and they are in agreement.  CODE STATUS: full code  TOTAL TIME TAKING CARE OF THIS PATIENT: 59 minutes.    Demetrios Loll M.D on 03/02/2015 at 6:43 PM  Between 7am to 6pm - Pager - [phone]  After 6pm go to [email or web address] - password [email or web address] [facility]  Office  [phone]  CC: Primary care physician; Rica Mast, MD

## 2015-03-02 NOTE — Progress Notes (Signed)
Pt admitted to 2A RM 252 from ED on non-rebreather. Tele box verified with Grayland Ormond, NT. Upon completing admission assessment pt complained of being hungry and that he had a feeding tube, which was not mentioned in report from ED RN. When an attempt was made to recieve feedings for the pt the admitting MD stated that he was in the room with another pt and he would not be able to help unless it is an emergency and that I should call a dietician. No dietician was availiable so no new orders were placed for the pt. Lab unable to collect blood from pt due to dehydration. Night shift nurse informed of delima and the need for order to be placed for pt feedings and unsuccessful lab draws.

## 2015-03-02 NOTE — ED Provider Notes (Signed)
[facility] Emergency Department Provider Note  ____________________________________________  Time seen: Approximately 3:38 PM  I have reviewed the triage vital signs and the nursing notes.   HISTORY  Chief Complaint Shortness of Breath    HPI Johnny Sanford is a 75 y.o. male presents for evaluation of cough. He feels very short of breath today. But ongoing for several days with associated weakness. Today he almost passed out on the home. He did not fall or his head. Wife states addicts slap him to keep him awake.  Head chest x-ray today and his doctor was concerned he may have pneumonia. He reports he has been wheezing and a productive cough for the last couple of days which is steadily worsening.  Denies pain.   Past Medical History  Diagnosis Date  . HLD (hyperlipidemia)   . HTN (hypertension)   . GERD (gastroesophageal reflux disease)   . Hypothyroidism   . Cancer ([place]) 1991    sqauamous cell ca  . CVA (cerebral vascular accident) ([place])   . DA (degenerative arthritis)   . RA (rheumatoid arthritis) ([place])   . Adrenal insufficiency ([place])   . Cervical spinal stenosis   . Falls   . Collagen vascular disease ([place])   . COPD (chronic obstructive pulmonary disease) ([place])     mild    Patient Active Problem List   Diagnosis Date Noted  . Pneumonia 03/02/2015  . Hypothyroidism 02/20/2015  . Acute respiratory failure with hypoxia ([place]) 02/12/2015  . Aspiration pneumonitis ([place]) 02/12/2015  . Orthostatic hypotension 02/12/2015  . Leukopenia 02/12/2015  . Anemia 02/12/2015  . Generalized weakness 02/12/2015  . S/P percutaneous endoscopic gastrostomy (PEG) tube placement ([place]) 02/12/2015  . COPD exacerbation ([place]) 02/12/2015  . Dysphagia 02/02/2015  . Spinal stenosis in cervical region 01/08/2015  . Recurrent falls 06/29/2014  . Allergic rhinitis 05/04/2014  . 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 CAD (coronary artery disease)  04/13/2014  . Multiple lung nodules on CT 04/13/2014  . Atherosclerosis of aorta ([place]) 04/13/2014  . Cough 03/31/2014  . Medicare annual wellness visit, subsequent 12/09/2013  . Anxiety state 05/10/2013  . Chronic neck pain 11/24/2012  . Abnormal CT scan, chest 06/03/2012  . Labile hypertension 05/20/2012  . Memory loss 03/31/2012  . Adrenal insufficiency ([place]) 01/20/2012  . Hyponatremia 12/24/2011  . Fatigue 10/28/2011  . Essential thrombocythemia ([place]) 06/13/2011  . Low back pain 05/19/2011  . Cardiomegaly 04/02/2011  . COPD (chronic obstructive pulmonary disease) ([place]) 04/02/2011  . Rheumatoid arthritis(714.0) 03/11/2011  . Insomnia 02/17/2011  . Arthralgia 02/17/2011  . PAD (peripheral artery disease) ([place]) 08/30/2009  . Carotid artery stenosis 06/10/2009  . Hyperlipidemia 06/08/2009    Past Surgical History  Procedure Laterality Date  . Carotid endarterectomy      left  . Skin lesion excision      Dr. Sarajane Jews, and Dr. Vickki Muff at [facility]  . Peg placement N/A 02/09/2015    Procedure: PERCUTANEOUS ENDOSCOPIC GASTROSTOMY (PEG) PLACEMENT;  Surgeon: Hulen Luster, MD;  Location: [facility] ENDOSCOPY;  Service: Endoscopy;  Laterality: N/A;    Current Outpatient Rx  Name  Route  Sig  Dispense  Refill  . aspirin 81 MG EC tablet   Feeding Tube   162 mg by Feeding Tube route daily.          . clonazePAM (KLONOPIN) 0.5 MG tablet   Feeding Tube   0.5 mg by Feeding Tube route 3 (three) times daily as needed for  anxiety.         Marland Kitchen escitalopram (LEXAPRO) 20 MG tablet   Feeding Tube   20 mg by Feeding Tube route daily.          . fludrocortisone (FLORINEF) 0.1 MG tablet   Feeding Tube   0.1 mg by Feeding Tube route daily.         . fluorouracil (EFUDEX) 5 % cream   Topical   Apply 1 application topically as needed (for skin spots).          . fluticasone (FLONASE) 50 MCG/ACT nasal spray   Each Nare   Place 1 spray into both nostrils daily as needed for rhinitis.   16 g    6   . Fluticasone Furoate-Vilanterol (BREO ELLIPTA) 200-25 MCG/INH AEPB   Inhalation   Inhale 200 mcg into the lungs daily.   1 each   6   . HYDROcodone-acetaminophen (NORCO/VICODIN) 5-325 MG tablet   Per Tube   Place 1 tablet into feeding tube every 4 (four) hours as needed for moderate pain.   30 tablet   0   . hydroxyurea (HYDREA) 500 MG capsule   Feeding Tube   1,500 mg by Feeding Tube route at bedtime.         Marland Kitchen ipratropium-albuterol (DUONEB) 0.5-2.5 (3) MG/3ML SOLN   Nebulization   Take 3 mLs by nebulization every 4 (four) hours as needed. Patient taking differently: Take 3 mLs by nebulization every 4 (four) hours as needed (for wheezing/shortness of breath).    360 mL   6   . levothyroxine (SYNTHROID, LEVOTHROID) 137 MCG tablet   Feeding Tube   137 mcg by Feeding Tube route daily before breakfast.         . Nutritional Supplements (FEEDING SUPPLEMENT, JEVITY 1.5 CAL/FIBER,) LIQD   Per Tube   Place 360 mLs into feeding tube 4 (four) times daily.   120 mL   6   . ranitidine (ZANTAC) 150 MG tablet   Feeding Tube   150 mg by Feeding Tube route 2 (two) times daily.           Allergies Simvastatin  Family History  Problem Relation Age of Onset  . Heart disease Father     Social History Social History  Substance Use Topics  . Smoking status: Former Smoker -- 15 years    Types: Cigarettes    Quit date: 10/07/2001  . Smokeless tobacco: Never Used     Comment: Quit 13 years ago  . Alcohol Use: No    Review of Systems Constitutional: Chills Eyes: No visual changes. ENT: No sore throat. Cardiovascular: Denies chest pain. Respiratory: See history of present illness Gastrointestinal: No abdominal pain.  No nausea, no vomiting.  No diarrhea.  No constipation. Feels "dehydrated". Genitourinary: Negative for dysuria. Musculoskeletal: Negative for back pain. Skin: Negative for rash. Neurological: Negative for headaches, focal weakness or  numbness.  10-point ROS otherwise negative.  ____________________________________________   PHYSICAL EXAM:  VITAL SIGNS: ED Triage Vitals  Enc Vitals Group     BP 03/02/15 1521 109/71 mmHg     Pulse Rate 03/02/15 1521 95     Resp 03/02/15 1521 25     Temp 03/02/15 1521 97.3 F (36.3 C)     Temp Source 03/02/15 1521 Oral     SpO2 03/02/15 1521 97 %     Weight 03/02/15 1521 139 lb (63.05 kg)     Height 03/02/15 1521 5\' 4"  (1.626 m)  Head Cir --      Peak Flow --      Pain Score 03/02/15 1523 0     Pain Loc --      Pain Edu? --      Excl. in [place]? --    Constitutional: Alert and oriented. Chronically and acutely ill-appearing Eyes: Conjunctivae are normal. PERRL. EOMI. Head: Atraumatic. Nose: No congestion/rhinnorhea. Mouth/Throat: Mucous membranes are super dry.  Oropharynx non-erythematous. Neck: No stridor.   Cardiovascular: Normal rate, regular rhythm. Grossly normal heart sounds.  Good peripheral circulation. Respiratory: Diffuse rales and rhonchi throughout. Moderate end expiratory wheezing. Mild to moderate increased work of breathing. Patient oxygen saturation 76% on 2 L, improves on 6 L and then changed to nonrebreather into the mid 90s. Gastrointestinal: Soft and nontender. No distention. No abdominal bruits. No CVA tenderness. Musculoskeletal: No lower extremity tenderness nor edema.  No joint effusions. Neurologic:  Normal speech and language. No gross focal neurologic deficits are appreciated. No gait instability. Skin:  Skin is warm, dry and intact. No rash noted. Psychiatric: Mood and affect are normal. Speech and behavior are normal.  ____________________________________________   LABS (all labs ordered are listed, but only abnormal results are displayed)  Labs Reviewed  COMPREHENSIVE METABOLIC PANEL - Abnormal; Notable for the following:    Sodium 131 (*)    Chloride 90 (*)    CO2 34 (*)    Glucose, Bld 140 (*)    BUN 26 (*)    Creatinine, Ser 0.55  (*)    Calcium 8.4 (*)    Albumin 3.1 (*)    All other components within normal limits  TROPONIN I - Abnormal; Notable for the following:    Troponin I 0.56 (*)    All other components within normal limits  CBC WITH DIFFERENTIAL/PLATELET - Abnormal; Notable for the following:    RBC 1.99 (*)    Hemoglobin 8.5 (*)    HCT 25.5 (*)    MCV 128.0 (*)    MCH 42.6 (*)    RDW 15.2 (*)    Platelets 924 (*)    Lymphs Abs 0.2 (*)    All other components within normal limits  PROTIME-INR - Abnormal; Notable for the following:    Prothrombin Time 15.9 (*)    All other components within normal limits  CULTURE, BLOOD (ROUTINE X 2)  CULTURE, BLOOD (ROUTINE X 2)  URINE CULTURE  LACTIC ACID, PLASMA  LACTIC ACID, PLASMA  URINALYSIS COMPLETEWITH MICROSCOPIC ([facility] ONLY)   ____________________________________________  EKG  Reviewed and interpreted by me at 1632 Heart rate 116 Sinus tachycardia, some motion artifact is noted, possible depressions noted in lateral leads but no ST elevation or obvious clear ischemic change. QRS 108 QTc 430 ____________________________________________  RADIOLOGY  Chest x-ray done via primary care today. Concerning for possible left lower lobe infiltrate. ____________________________________________   PROCEDURES  Procedure(s) performed: None  Critical Care performed: No  ____________________________________________   INITIAL IMPRESSION / ASSESSMENT AND PLAN / ED COURSE  Pertinent labs & imaging results that were available during my care of the patient were reviewed by me and considered in my medical decision making (see chart for details).  Patient is for cough, weakness, dyspnea. Patient has significant oxygen deficit. This associated with a productive cough and recent hospital stay. Based on review his chest x-ray and symptomatology I am concerned about possibly pneumonia as well as COPD exacerbation. He does have a known history of adrenal  insufficiency and I ordered hydrocortisone IV, he  has taken his oral steroid at home today.  CRITICAL CARE Performed by: Delman Kitten   Total critical care time: 40 minutes  Critical care time was exclusive of separately billable procedures and treating other patients.  Critical care was necessary to treat or prevent imminent or life-threatening deterioration.  Critical care was time spent personally by me on the following activities: development of treatment plan with patient and/or surrogate as well as nursing, discussions with consultants, evaluation of patient's response to treatment, examination of patient, obtaining history from patient or surrogate, ordering and performing treatments and interventions, ordering and review of laboratory studies, ordering and review of radiographic studies, pulse oximetry and re-evaluation of patient's condition.   Troponin concerning for demand ischemia versus possible non-ST elevation MI. Patient remains awake and alert, beginning the hospital for further and ongoing care. He does appear to be improving after nebulizers and steroids. Does continue to have tachypnea, now concerned about the possibility of a COPD exacerbation with possible associated pneumonia as primary etiology at this time. Consider severe sepsis given the associated non-ST elevation MI and demand ischemia. ____________________________________________   FINAL CLINICAL IMPRESSION(S) / ED DIAGNOSES  Final diagnoses:  Demand ischemia of myocardium (HCC)  Sepsis, due to unspecified organism [facility])  Healthcare-associated pneumonia  NSTEMI (non-ST elevated myocardial infarction) ([place])  COPD exacerbation (HCC)      Delman Kitten, MD 03/02/15 1711

## 2015-03-03 DIAGNOSIS — Z9221 Personal history of antineoplastic chemotherapy: Secondary | ICD-10-CM

## 2015-03-03 DIAGNOSIS — Z923 Personal history of irradiation: Secondary | ICD-10-CM

## 2015-03-03 DIAGNOSIS — R7989 Other specified abnormal findings of blood chemistry: Secondary | ICD-10-CM

## 2015-03-03 DIAGNOSIS — D473 Essential (hemorrhagic) thrombocythemia: Secondary | ICD-10-CM

## 2015-03-03 DIAGNOSIS — Z85858 Personal history of malignant neoplasm of other endocrine glands: Secondary | ICD-10-CM

## 2015-03-03 LAB — BASIC METABOLIC PANEL
Anion gap: 6 (ref 5–15)
BUN: 20 mg/dL (ref 6–20)
CHLORIDE: 92 mmol/L — AB (ref 101–111)
CO2: 34 mmol/L — AB (ref 22–32)
CREATININE: 0.53 mg/dL — AB (ref 0.61–1.24)
Calcium: 8.4 mg/dL — ABNORMAL LOW (ref 8.9–10.3)
GFR calc Af Amer: >60 mL/min (ref >60)
GFR calc non Af Amer: >60 mL/min (ref >60)
GLUCOSE: 169 mg/dL — AB (ref 65–99)
Potassium: 4.7 mmol/L (ref 3.5–5.1)
Sodium: 132 mmol/L — ABNORMAL LOW (ref 135–145)

## 2015-03-03 LAB — BLOOD CULTURE ID PANEL (REFLEXED)
Acinetobacter baumannii: NOT DETECTED
CANDIDA ALBICANS: NOT DETECTED
CANDIDA GLABRATA: NOT DETECTED
CANDIDA TROPICALIS: NOT DETECTED
Candida krusei: NOT DETECTED
Candida parapsilosis: NOT DETECTED
Carbapenem resistance: NOT DETECTED
ENTEROBACTER CLOACAE COMPLEX: NOT DETECTED
ENTEROBACTERIACEAE SPECIES: DETECTED — AB
ENTEROCOCCUS SPECIES: NOT DETECTED
ESCHERICHIA COLI: NOT DETECTED
Haemophilus influenzae: NOT DETECTED
KLEBSIELLA PNEUMONIAE: NOT DETECTED
Klebsiella oxytoca: NOT DETECTED
Listeria monocytogenes: NOT DETECTED
Methicillin resistance: NOT DETECTED
NEISSERIA MENINGITIDIS: NOT DETECTED
Proteus species: NOT DETECTED
Pseudomonas aeruginosa: NOT DETECTED
STAPHYLOCOCCUS SPECIES: NOT DETECTED
STREPTOCOCCUS AGALACTIAE: NOT DETECTED
STREPTOCOCCUS PNEUMONIAE: NOT DETECTED
STREPTOCOCCUS SPECIES: NOT DETECTED
Serratia marcescens: DETECTED — AB
Staphylococcus aureus (BCID): NOT DETECTED
Streptococcus pyogenes: NOT DETECTED
Vancomycin resistance: NOT DETECTED

## 2015-03-03 LAB — GLUCOSE, CAPILLARY
GLUCOSE-CAPILLARY: 146 mg/dL — AB (ref 65–99)
GLUCOSE-CAPILLARY: 225 mg/dL — AB (ref 65–99)
Glucose-Capillary: 204 mg/dL — ABNORMAL HIGH (ref 65–99)

## 2015-03-03 LAB — TROPONIN I
Troponin I: 0.27 ng/mL — ABNORMAL HIGH (ref <0.031)
Troponin I: 0.21 ng/mL — ABNORMAL HIGH (ref <0.031)

## 2015-03-03 LAB — CBC
HCT: 24.5 % — ABNORMAL LOW (ref 40.0–52.0)
Hemoglobin: 8.2 g/dL — ABNORMAL LOW (ref 13.0–18.0)
MCH: 43 pg — AB (ref 26.0–34.0)
MCHC: 33.4 g/dL (ref 32.0–36.0)
MCV: 128.4 fL — AB (ref 80.0–100.0)
PLATELETS: 864 10*3/uL — AB (ref 150–440)
RBC: 1.91 MIL/uL — AB (ref 4.40–5.90)
RDW: 15.4 % — AB (ref 11.5–14.5)
WBC: 3.5 10*3/uL — ABNORMAL LOW (ref 3.8–10.6)

## 2015-03-03 MED ORDER — FREE WATER
120.0000 mL | Status: DC
  Administered 2015-03-04 – 2015-03-05 (×2): 120 mL

## 2015-03-03 MED ORDER — HYDROXYUREA 100 MG/ML ORAL SUSPENSION
1500.0000 mg | Freq: Every day | ORAL | Status: DC
  Administered 2015-03-03 – 2015-03-04 (×2): 1500 mg
  Filled 2015-03-03 (×5): qty 15

## 2015-03-03 MED ORDER — JEVITY 1.5 CAL/FIBER PO LIQD
360.0000 mL | Freq: Four times a day (QID) | ORAL | Status: DC
  Administered 2015-03-03 – 2015-03-04 (×6): 360 mL

## 2015-03-03 MED ORDER — HYDRALAZINE HCL 20 MG/ML IJ SOLN: 10.0000 mg | Freq: Four times a day (QID) | INTRAMUSCULAR | Status: DC | PRN

## 2015-03-03 MED ORDER — AMLODIPINE BESYLATE 5 MG PO TABS
2.5000 mg | ORAL_TABLET | Freq: Every day | ORAL | Status: DC
  Administered 2015-03-03 – 2015-03-04 (×2): 2.5 mg via ORAL
  Filled 2015-03-03 (×2): qty 1

## 2015-03-03 MED ORDER — METHYLPREDNISOLONE SODIUM SUCC 125 MG IJ SOLR
60.0000 mg | Freq: Two times a day (BID) | INTRAMUSCULAR | Status: DC
  Administered 2015-03-03 – 2015-03-05 (×4): 60 mg via INTRAVENOUS
  Filled 2015-03-03 (×4): qty 2

## 2015-03-03 MED ORDER — DEXTROSE 5 % IV SOLN
2.0000 g | INTRAVENOUS | Status: DC
  Administered 2015-03-03 – 2015-03-04 (×2): 2 g via INTRAVENOUS
  Filled 2015-03-03 (×4): qty 2

## 2015-03-03 MED ORDER — IPRATROPIUM-ALBUTEROL 0.5-2.5 (3) MG/3ML IN SOLN
3.0000 mL | RESPIRATORY_TRACT | Status: DC
  Administered 2015-03-03 – 2015-03-04 (×8): 3 mL via RESPIRATORY_TRACT
  Filled 2015-03-03 (×8): qty 3

## 2015-03-03 MED ORDER — FLUDROCORTISONE ACETATE 0.1 MG PO TABS
0.0500 mg | ORAL_TABLET | Freq: Every day | ORAL | Status: DC
  Administered 2015-03-04: 0.05 mg
  Filled 2015-03-03: qty 1
  Filled 2015-03-03: qty 0.5

## 2015-03-03 NOTE — Progress Notes (Signed)
Spoke w. MD Kalisetti re elevated BP, she will make changes to med regimen

## 2015-03-03 NOTE — Progress Notes (Signed)
Per RD Jolie pt to receive 30 ccs free water before and after hydroxyurea at night

## 2015-03-03 NOTE — Progress Notes (Signed)
Pharmacy Antibiotic Note  Johnny Sanford is a 75 y.o. male admitted on 03/02/2015 with bacteremia.  Pharmacy has been consulted for ceftriaxone dosing.   Plan: BCID results were discussed with Dr. Tressia Miners and the patient's antibiotics will be changed to Ceftriaxone 2g IV q24hr. Further narrowing will be determined based on finalized susceptibilities.  Height: 5\' 4"  (162.6 cm) Weight: 143 lb 8 oz (65.091 kg) IBW/kg (Calculated) : 59.2  Temp (24hrs), Avg:97.7 F (36.5 C), Min:97.3 F (36.3 C), Max:98.1 F (36.7 C)   Recent Labs Lab 02/28/15 1812 02/28/15 2100 03/02/15 1554 03/02/15 1556 03/02/15 2059 03/03/15 0316  WBC 6.5  --   --  3.8  --  3.5*  CREATININE 0.71 0.58*  --  0.55*  --  0.53*  LATICACIDVEN  --   --  1.6  --  1.5  --     Estimated Creatinine Clearance: 68.9 mL/min (by C-G formula based on Cr of 0.53).    Allergies  Allergen Reactions  . Simvastatin Anxiety    Antimicrobials this admission: Anti-infectives    Start     Dose/Rate Route Frequency Ordered Stop   03/03/15 1700  cefTRIAXone (ROCEPHIN) 2 g in dextrose 5 % 50 mL IVPB     2 g 100 mL/hr over 30 Minutes Intravenous Every 24 hours 03/03/15 0903     03/03/15 0400  ceFEPIme (MAXIPIME) 2 g in dextrose 5 % 50 mL IVPB  Status:  Discontinued     2 g 100 mL/hr over 30 Minutes Intravenous Every 12 hours 03/02/15 1938 03/03/15 0853   03/03/15 0000  ceFEPIme (MAXIPIME) 2 g in dextrose 5 % 50 mL IVPB  Status:  Discontinued     2 g 100 mL/hr over 30 Minutes Intravenous  Once 03/02/15 1747 03/02/15 1826   03/03/15 0000  vancomycin (VANCOCIN) IVPB 1000 mg/200 mL premix     1,000 mg 200 mL/hr over 60 Minutes Intravenous  Once 03/02/15 1747 03/03/15 0100   03/02/15 2200  vancomycin (VANCOCIN) IVPB 750 mg/150 ml premix  Status:  Discontinued     750 mg 150 mL/hr over 60 Minutes Intravenous Every 12 hours 03/02/15 1747 03/03/15 0853   03/02/15 1530  ceFEPIme (MAXIPIME) 2 g in dextrose 5 % 50 mL IVPB     2 g 100  mL/hr over 30 Minutes Intravenous  Once 03/02/15 1519 03/02/15 1650   03/02/15 1530  vancomycin (VANCOCIN) IVPB 1000 mg/200 mL premix     1,000 mg 200 mL/hr over 60 Minutes Intravenous  Once 03/02/15 1519 03/02/15 1704      Microbiology results: Results for orders placed or performed during the hospital encounter of 03/02/15  Blood Culture (routine x 2)     Status: None (Preliminary result)   Collection Time: 03/02/15  3:45 PM  Result Value Ref Range Status   Specimen Description BLOOD RIGHT FOREARM  Final   Special Requests BOTTLES DRAWN AEROBIC AND ANAEROBIC  3CC  Final   Culture  Setup Time   Final    GRAM NEGATIVE RODS IN BOTH AEROBIC AND ANAEROBIC BOTTLES IDENTIFIED AS SERRATIA MARCESCENS CRITICAL RESULT CALLED TO, READ BACK BY AND VERIFIED WITH: Roe Coombs 03/03/2015 0844 BY J RAZZAKSUAREZ,MT    Culture   Final    SERRATIA MARCESCENS IN BOTH AEROBIC AND ANAEROBIC BOTTLES SUSCEPTIBILITIES TO FOLLOW    Report Status PENDING  Incomplete  Blood Culture ID Panel (Reflexed)     Status: Abnormal   Collection Time: 03/02/15  3:45 PM  Result Value Ref  Range Status   Enterococcus species NOT DETECTED NOT DETECTED Final   Listeria monocytogenes NOT DETECTED NOT DETECTED Final   Staphylococcus species NOT DETECTED NOT DETECTED Final   Staphylococcus aureus NOT DETECTED NOT DETECTED Final   Streptococcus species NOT DETECTED NOT DETECTED Final   Streptococcus agalactiae NOT DETECTED NOT DETECTED Final   Streptococcus pneumoniae NOT DETECTED NOT DETECTED Final   Streptococcus pyogenes NOT DETECTED NOT DETECTED Final   Acinetobacter baumannii NOT DETECTED NOT DETECTED Final   Enterobacteriaceae species DETECTED (A) NOT DETECTED Final    Comment: CRITICAL RESULT CALLED TO, READ BACK BY AND VERIFIED WITH: Roe Coombs 03/03/2015 0844 BY J RAZZAKSUAREZ,MT    Enterobacter cloacae complex NOT DETECTED NOT DETECTED Final   Escherichia coli NOT DETECTED NOT DETECTED Final    Klebsiella oxytoca NOT DETECTED NOT DETECTED Final   Klebsiella pneumoniae NOT DETECTED NOT DETECTED Final   Proteus species NOT DETECTED NOT DETECTED Final   Serratia marcescens DETECTED (A) NOT DETECTED Final    Comment: CRITICAL RESULT CALLED TO, READ BACK BY AND VERIFIED WITH: Roe Coombs 03/03/2015 0844 BY J RAZZAKSUAREZ,MT    Haemophilus influenzae NOT DETECTED NOT DETECTED Final   Neisseria meningitidis NOT DETECTED NOT DETECTED Final   Pseudomonas aeruginosa NOT DETECTED NOT DETECTED Final   Candida albicans NOT DETECTED NOT DETECTED Final   Candida glabrata NOT DETECTED NOT DETECTED Final   Candida krusei NOT DETECTED NOT DETECTED Final   Candida parapsilosis NOT DETECTED NOT DETECTED Final   Candida tropicalis NOT DETECTED NOT DETECTED Final   Carbapenem resistance NOT DETECTED NOT DETECTED Final   Methicillin resistance NOT DETECTED NOT DETECTED Final   Vancomycin resistance NOT DETECTED NOT DETECTED Final  Blood Culture (routine x 2)     Status: None (Preliminary result)   Collection Time: 03/02/15  3:53 PM  Result Value Ref Range Status   Specimen Description BLOOD RIGHT ANTECUBITAL  Final   Special Requests BOTTLES DRAWN AEROBIC AND ANAEROBIC  3CC  Final   Culture NO GROWTH < 24 HOURS  Final   Report Status PENDING  Incomplete    Thank you for allowing pharmacy to be a part of this patient's care.  Roe Coombs, PharmD Pharmacy Resident 03/03/2015

## 2015-03-03 NOTE — Consult Note (Signed)
Cardiology Consultation Note  Patient ID: Johnny Sanford, MRN: [id number], DOB/AGE: Oct 29, 1940 75 y.o. Admit date: 03/02/2015   Date of Consult: 03/03/2015 Primary Physician: Rica Mast, MD Primary Cardiologist: Dr. Rockey Situ, MD  Chief Complaint: SOB Reason for Consult: Elevated troponin  HPI: 75 y.o. male with h/o carotid artery stenosis s/p CRA on the left in 2003 with residual 60% stenosis on the right, recently started on home oxygen 2.5 weeks ago 2/2 chronic respiratory failure 2/2 COPD. 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 Cardiology is consulted for mildly elevated troponin.   He has never undergone a stress test or cardiac catheterization. Echo from 03/2011 showed EF 65-70%, no RWMA, LBV diastolic function parameters were normal, mild MR, left atrium was mildly dilated, PASP was normal. Echo from 02/04/2015 showed EF 60-65%, normal wall motion, GR1DD, mild AS, trivial AI, calcified mitral annulus, trivial pericardial effusion. He is followed by Dr. Grayland Ormond for his essential thrombocytosis and is on hydroxyurea. He has previously had multiple hospitalizations for hyponatremia in 12/2011, 04/2012, and 07/2013. 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   Per both the patient and his wife he had been in his usual state of health up until 2/1 when he began to feel weak. Over the past 1 month he reports a "rattling" in his chest. No cough. He presented to the ED on 2/1 with the weakness s/p fall and was discharged home with outpatient follow up. His labs were at baseline. For him this included a platelet count in the 900,000 range. No CXR was done at that time.  He continue to feel weak and returned to the ED on 2/3. He noted subjective fevers, chills, and myalgias. No cough. No chest pain, tachy-palpitations, LEE, orthopnea, PND, early satiety, nausea, or vomiting. Upon the patient's arrival to [facility] they were found to have a troponin of 0.56-->0.42-->0.27, WBC normal, PLT count at baseline, hgb 8.5-->8.2, Na 313-->132, lactic acid 1.5, blood cultures negative x 2 to date. ECG showed sinus tachycardia, 116 bpm, wandering baseline, PACs, inferolateral st/t changes, CXR showed left basilar PNA. He was started on vancomycin. He is without chest pain.    Past Medical History  Diagnosis Date  . HLD (hyperlipidemia)   . HTN (hypertension)   . GERD (gastroesophageal reflux disease)   . Hypothyroidism   . Cancer ([place]) 1991    sqauamous cell ca  . CVA (cerebral vascular accident) ([place])   . DA (degenerative arthritis)   . RA (rheumatoid arthritis) ([place])   . Adrenal insufficiency ([place])   . Cervical spinal stenosis   . Falls   . Collagen vascular disease ([place])   . COPD (chronic obstructive pulmonary disease) ([facility])     mild      Most Recent Cardiac Studies: Echo 02/04/2015: Study Conclusions  - Left ventricle: The cavity size was normal. Wall thickness was normal. Systolic function was normal. The estimated ejection fraction was in the range of 60% to 65%. Wall motion was normal; there were no regional wall motion abnormalities. Doppler parameters are consistent with abnormal left ventricular relaxation (grade 1 diastolic dysfunction). - Aortic valve: There was very mild stenosis. There was trivial regurgitation. -  Mitral valve: Calcified annulus. - Pericardium, extracardiac: A trivial pericardial effusion was identified.  Impressions:  - Normal LV function; grade 1 diastolic dysfunction; calcfied aortic valve with very mild AS (peak velocity of 2.1 m/s); trace AI.   Surgical History:  Past Surgical History  Procedure  Laterality Date  . Carotid endarterectomy      left  . Skin lesion excision      Dr. Sarajane Jews, and Dr. Vickki Muff at [facility]  . Peg placement N/A 02/09/2015    Procedure: PERCUTANEOUS ENDOSCOPIC GASTROSTOMY (PEG) PLACEMENT;  Surgeon: Hulen Luster, MD;  Location: [facility] ENDOSCOPY;  Service: Endoscopy;  Laterality: N/A;     Home Meds: Prior to Admission medications   Medication Sig Start Date End Date Taking? Authorizing Provider  aspirin 81 MG EC tablet 162 mg by Feeding Tube route daily.    Yes Historical Provider, MD  clonazePAM (KLONOPIN) 0.5 MG tablet 0.5 mg by Feeding Tube route 3 (three) times daily as needed for anxiety.   Yes Historical Provider, MD  escitalopram (LEXAPRO) 20 MG tablet 20 mg by Feeding Tube route daily.    Yes Historical Provider, MD  fludrocortisone (FLORINEF) 0.1 MG tablet 0.1 mg by Feeding Tube route daily.   Yes Historical Provider, MD  fluorouracil (EFUDEX) 5 % cream Apply 1 application topically as needed (for skin spots).    Yes Historical Provider, MD  fluticasone (FLONASE) 50 MCG/ACT nasal spray Place 1 spray into both nostrils daily as needed for rhinitis. 06/24/13  Yes Jackolyn Confer, MD  Fluticasone Furoate-Vilanterol (BREO ELLIPTA) 200-25 MCG/INH AEPB Inhale 200 mcg into the lungs daily. 02/20/15  Yes Jackolyn Confer, MD  HYDROcodone-acetaminophen (NORCO/VICODIN) 5-325 MG tablet Place 1 tablet into feeding tube every 4 (four) hours as needed for moderate pain. 02/12/15  Yes Theodoro Grist, MD  hydroxyurea (HYDREA) 500 MG capsule 1,500 mg by Feeding Tube route at bedtime.   Yes Historical Provider, MD  ipratropium-albuterol (DUONEB) 0.5-2.5 (3) MG/3ML SOLN Take 3 mLs by nebulization every 4 (four) hours as needed. Patient taking differently: Take 3 mLs by nebulization every 4 (four) hours as needed (for wheezing/shortness of breath).  02/12/15  Yes Theodoro Grist, MD  levothyroxine (SYNTHROID, LEVOTHROID) 137 MCG tablet 137 mcg by Feeding Tube route daily before breakfast.    Yes Historical Provider, MD  Nutritional Supplements (FEEDING SUPPLEMENT, JEVITY 1.5 CAL/FIBER,) LIQD Place 360 mLs into feeding tube 4 (four) times daily. 02/12/15  Yes Theodoro Grist, MD  ranitidine (ZANTAC) 150 MG tablet 150 mg by Feeding Tube route 2 (two) times daily.   Yes Historical Provider, MD    Inpatient Medications:  . aspirin  324 mg Per Tube Once  . aspirin EC  81 mg Oral Daily  . ceFEPime (MAXIPIME) IV  2 g Intravenous Q12H  . escitalopram  20 mg Per Tube Daily  . famotidine  20 mg Per Tube Daily  . fludrocortisone  0.1 mg Per Tube Daily  . heparin  5,000 Units Subcutaneous 3 times per day  . hydroxyurea  1,500 mg Per Tube QHS  . levothyroxine  137 mcg Per Tube QAC breakfast  . mometasone-formoterol  2 puff Inhalation BID  . sodium chloride flush  3 mL Intravenous Q12H  . vancomycin  750 mg Intravenous Q12H   . feeding supplement (JEVITY 1.2 CAL) 1,000 mL (03/03/15 0211)    Allergies:  Allergies  Allergen Reactions  . Simvastatin Anxiety    Social History   Social History  . Marital Status: Married  Spouse Name: N/A  . Number of Children: N/A  . Years of Education: N/A   Occupational History  . Not on file.   Social History Main Topics  . Smoking status: Former Smoker -- 15 years    Types: Cigarettes    Quit date: 10/07/2001  . Smokeless tobacco: Never Used     Comment: Quit 13 years ago  . Alcohol Use: No  . Drug Use: No  . Sexual Activity: Not on file   Other Topics Concern  . Not on file   Social History Narrative   Married, retired, gets regular exercise.    Lives at home with wife. Has a walker to ambulate      Cell # [phone]     Family History  Problem Relation Age of Onset  . Heart disease Father      Review of Systems: Review of Systems  Constitutional: Positive for fever, chills and malaise/fatigue. Negative for diaphoresis.  HENT: Positive for congestion.   Eyes: Negative for discharge and redness.  Respiratory: Positive  for shortness of breath and wheezing. Negative for cough, hemoptysis and sputum production.   Cardiovascular: Negative for chest pain, palpitations, orthopnea, claudication, leg swelling and PND.  Gastrointestinal: Negative for heartburn, nausea, vomiting and abdominal pain.  Musculoskeletal: Positive for myalgias and falls. Negative for back pain, joint pain and neck pain.  Skin: Negative for rash.  Neurological: Positive for weakness. Negative for dizziness, tingling, tremors, sensory change, speech change, focal weakness, seizures, loss of consciousness and headaches.  Endo/Heme/Allergies: Does not bruise/bleed easily.  Psychiatric/Behavioral: Negative for depression and substance abuse. The patient is not nervous/anxious.   All other systems reviewed and are negative.   Labs:  Recent Labs  03/02/15 1556 03/02/15 2059 03/03/15 0316  TROPONINI 0.56* 0.42* 0.27*   Lab Results  Component Value Date   WBC 3.5* 03/03/2015   HGB 8.2* 03/03/2015   HCT 24.5* 03/03/2015   MCV 128.4* 03/03/2015   PLT 864* 03/03/2015    Recent Labs Lab 03/02/15 1556 03/03/15 0316  NA 131* 132*  K 4.2 4.7  CL 90* 92*  CO2 34* 34*  BUN 26* 20  CREATININE 0.55* 0.53*  CALCIUM 8.4* 8.4*  PROT 7.1  --   BILITOT 0.5  --   ALKPHOS 52  --   ALT 21  --   AST 19  --   GLUCOSE 140* 169*   Lab Results  Component Value Date   CHOL 135 04/05/2014   HDL 56.90 04/05/2014   LDLCALC 67 04/05/2014   TRIG 55.0 04/05/2014   No results found for: DDIMER  Radiology/Studies:  Dg Chest 2 View  03/02/2015  CLINICAL DATA:  75 year old male with possible aspiration. EXAM: CHEST  2 VIEW COMPARISON:  None. FINDINGS: Mild cardiomegaly again noted scratch a mild cardiomegaly and peribronchial thickening again noted. Mildly increased left lower lobe opacity may represent atelectasis, aspiration or pneumonia. There is no evidence of pleural effusion, pneumothorax, edema or acute bony abnormality. IMPRESSION: Mild left  lower lobe opacity which may represent atelectasis, or pneumonia. Radiographic follow-up to resolution is recommended. Electronically Signed   By: Margarette Canada M.D.   On: 03/02/2015 14:14   Dg Abd 1 View  02/06/2015  CLINICAL DATA:  Assess NG tube placement ; impaired NG tube feeding EXAM: ABDOMEN - 1 VIEW COMPARISON:  Chest and abdominal film of February 05, 2015 FINDINGS: The radiodense tip of the feeding to lies in the region of the gastric body laterally. The stomach  does not appear distended with gas. There is contrast in the ascending, transverse, and proximal descending colon with no significant small bowel contrast demonstrated. IMPRESSION: The feeding tube tip lies in the proximal aspect of the gastric body. Advancement of the tube for the distal stomach would be useful. This may occur spontaneously with additional tube slack being provided and turning the patient onto his right side. Electronically Signed   By: David  Martinique M.D.   On: 02/06/2015 08:00   Dg Abd 1 View  02/05/2015  CLINICAL DATA:  Encounter for NG tube placement EXAM: ABDOMEN - 1 VIEW COMPARISON:  None. FINDINGS: NG tube is in place with the tip in the mid stomach. Nonobstructive bowel gas pattern. IMPRESSION: NG tube tip in the mid stomach. Electronically Signed   By: Rolm Baptise M.D.   On: 02/05/2015 15:38   Ct Soft Tissue Neck W Contrast  02/07/2015  CLINICAL DATA:  History of aspiration and SIADH. EXAM: CT NECK WITH CONTRAST TECHNIQUE: Multidetector CT imaging of the neck was performed using the standard protocol following the bolus administration of intravenous contrast. CONTRAST:  75 mL OMNIPAQUE IOHEXOL 300 MG/ML  SOLN COMPARISON:  Chest CT scan 02/03/2015. FINDINGS: Pharynx and larynx: Unremarkable. Salivary glands: Unremarkable. Thyroid: Unremarkable. Lymph nodes: None. Vascular: The patient has dense carotid atherosclerotic vascular disease. Scattered atherosclerotic calcifications of the vertebral arteries also identified.  Limited intracranial: No focal abnormality is seen. Visualized orbits: Unremarkable. Mastoids and visualized paranasal sinuses: Clear. Skeleton: No lytic or sclerotic bony lesion is seen. The patient has multilevel cervical spondylosis. Upper chest: Mild biapical scar is seen as on the prior chest CT. There is a new small focus of ground-glass attenuation the refer the right upper lobe. IMPRESSION: No acute abnormality. Extensive atherosclerotic vascular disease. Small focus of ground-glass attenuation the periphery of the right upper lobe is new since the comparison chest CT and may represent focal inflammatory change. Extensive atherosclerosis. Electronically Signed   By: Inge Rise M.D.   On: 02/07/2015 14:07   Ct Angio Chest Pe W/cm &/or Wo Cm  02/03/2015  CLINICAL DATA:  Hypotension.  History of SIADH.  Hypoxia. EXAM: CT ANGIOGRAPHY CHEST WITH CONTRAST TECHNIQUE: Multidetector CT imaging of the chest was performed using the standard protocol during bolus administration of intravenous contrast. Multiplanar CT image reconstructions and MIPs were obtained to evaluate the vascular anatomy. CONTRAST:  43mL OMNIPAQUE IOHEXOL 350 MG/ML SOLN COMPARISON:  Chest CT dated 04/12/2014. Also chest CT dated 06/07/2013 and 05/24/2012. FINDINGS: Mediastinum/Lymph Nodes: Thoracic aorta is normal in caliber and configuration, with scattered atherosclerotic changes. No aortic aneurysm or dissection. No pulmonary embolism identified within the main, lobar or segmental pulmonary artery branches bilaterally. Heart size is upper normal, stable. No pericardial effusion. Coronary artery calcifications noted. Several small lymph nodes noted within the mediastinum. No mass or enlarged lymph nodes seen within the mediastinum or perihilar regions. Lungs/Pleura: Biapical scarring/fibrosis appears stable compared to previous exams. New small nodular consolidations, some with ground-glass density, are appreciated within the right upper  lobe, right lower lobe, lingula and left lower lobe. Largest on the right is seen within the inferior segment of the right upper lobe (series 6, image 55) measuring 12 x 7 mm. Largest on the left is within the medial segment of the left lower lobe measuring 13 x 9 mm (series 6, image 75). These are nonspecific and too small to definitively characterize. 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 No pleural effusion. No pneumothorax. Small emphysematous blebs again noted within each  lung apex. Upper abdomen: No acute findings. Stomach moderately distended, incompletely imaged. Musculoskeletal: Mild degenerative change within the thoracic spine but no acute osseous abnormality. Review of the MIP images confirms the above findings. IMPRESSION: 1. New small nodular consolidations, some with ground-glass density, scattered throughout the right upper lobe, right lower lobe, lingula and left lower lobe. Measurements for the largest given above. These are nonspecific in appearance and too small to definitively characterize. Differential includes atypical pneumonias such as viral or fungal, interstitial pneumonias, edema related to mild volume overload/CHF, chronic interstitial diseases, hypersensitivity pneumonitis, and respiratory bronchiolitis. Neoplastic process less likely. Recommend follow-up chest CT in 3-6 months to ensure resolution. 2. No aortic aneurysm or dissection. 3. No pulmonary embolism seen. 4. Heart size is upper normal. No pericardial effusion. Coronary artery calcifications, particularly dense within the left main and left anterior descending coronary arteries. Recommend correlation with any possible associated cardiac symptoms. Electronically Signed   By: Franki Cabot M.D.   On: 02/03/2015 13:59   Dg Esophagus  02/05/2015  CLINICAL DATA:  Six-month history of dysphagia especially to solids, cough, weight loss. EXAM: ESOPHOGRAM/BARIUM SWALLOW TECHNIQUE: Single contrast examination was performed using thin  barium. A barium tablet was administered. FLUOROSCOPY TIME:  Fluoroscopy Time:  1 minutes, 12 seconds. Number of Acquired Images:  10 COMPARISON:  Chest x-ray of February 05, 2015 FINDINGS: Upon the initial ingestion of thick barium there was prompt laryngeal penetration with extension of barium into the trachea and the bronchi. This elicited a mild cough. Some barium did enter the esophagus allowing visualization of the structure. The cervical esophagus appeared to distend well. No significant mass effect upon the cervical esophagus was observed. The thoracic esophagus distended well. Under fluoroscopic observation the lower cervical esophagus and appeared to distend reasonably well. No mucosal irregularity was observed. An attempt was made to have the patient ingest a barium tablet. However, he was not able to propel the tablet into the posterior oropharynx with the tongue and had to spit out the tablet. IMPRESSION: 1. This is a quite limited study due to a large amount of aspiration of the barium preparation. A double-contrast study had been planned but only a very limited single contrast study could be performed following the large amount of aspiration. The epiglottic function appears disordered and the motions of the tongue propelling the tablet as well as the liquid barium into the posterior oropharynx is disordered. 2. No mass-effect upon the proximal esophagus by the cervical spine is observed. 3. The thoracic esophagus is grossly normal for age with only mild changes of presbyesophagus. Electronically Signed   By: David  Martinique M.D.   On: 02/05/2015 10:47   Dg Chest Port 1 View  03/02/2015  CLINICAL DATA:  Sepsis. EXAM: PORTABLE CHEST 1 VIEW COMPARISON:  Same day. FINDINGS: Stable cardiomegaly is noted. No pneumothorax is noted. Right lung is clear. Increased left basilar opacity is noted concerning for worsening pneumonia or atelectasis with possible associated pleural effusion. Bony thorax is  unremarkable. IMPRESSION: Increased left basilar opacity concerning for worsening pneumonia or atelectasis with possible associated pleural effusion. Electronically Signed   By: Marijo Conception, M.D.   On: 03/02/2015 18:29   Dg Chest Port 1 View  02/07/2015  CLINICAL DATA:  Aspiration. EXAM: PORTABLE CHEST 1 VIEW COMPARISON:  02/05/2015. FINDINGS: Orogastric tube noted with tip below left hemidiaphragm. Mediastinum hilar structures normal. Cardiomegaly. No pulmonary venous congestion. Mild left base subsegmental atelectasis and/or infiltrate. No pleural effusion or pneumothorax. IMPRESSION: 1.  Orogastric tube noted with tip below left hemidiaphragm. 2. Mild left base subsegmental atelectasis and or infiltrate. 3. Mild cardiomegaly.  No pulmonary venous congestion. Electronically Signed   By: Marcello Moores  Register   On: 02/07/2015 07:24   Dg Chest Port 1 View  02/05/2015  CLINICAL DATA:  75 year old male with aspiration. Hypoxia. Initial encounter. EXAM: PORTABLE CHEST 1 VIEW COMPARISON:  Chest CTA 02/03/2015 and earlier. FINDINGS: Portable AP upright view at 0610 hours. Stable lung volumes since March 2016. Calcified aortic atherosclerosis. Stable cardiac size and mediastinal contours. Visualized tracheal air column is within normal limits. No pneumothorax, pulmonary edema or pleural effusion. Chronic streaky left infrahilar opacity. Radiographically, no acute pulmonary opacity. IMPRESSION: Stable radiographic appearance of the chest since 2016, no acute aspiration suspected. See also report of the recent chest CTA 02/03/2015. Electronically Signed   By: Genevie Ann M.D.   On: 02/05/2015 07:53    EKG: sinus tachycardia, 116 bpm, wandering baseline, PACs, inferolateral st/t changes  Weights: Filed Weights   03/02/15 1521 03/03/15 0500  Weight: 139 lb (63.05 kg) 143 lb 8 oz (65.091 kg)     Physical Exam: Blood pressure 161/99, pulse 96, temperature 97.7 F (36.5 C), temperature source Oral, resp. rate 20,  height 5\' 4"  (1.626 m), weight 143 lb 8 oz (65.091 kg), SpO2 97 %. Body mass index is 24.62 kg/(m^2). General: Frail appearing, in no acute distress. Head: Normocephalic, atraumatic, sclera non-icteric, no xanthomas, nares are without discharge.  Neck: Negative for carotid bruits. JVD not elevated. Lungs: Decreased breath sounds bilaterally with expiratory wheezing. Breathing is unlabored. Heart: RRR with S1 S2. No murmurs, rubs, or gallops appreciated. Abdomen: Soft, non-tender, non-distended with normoactive bowel sounds. No hepatomegaly. No rebound/guarding. No obvious abdominal masses. Msk:  Strength and tone appear normal for age. Extremities: No clubbing or cyanosis. No edema.  Distal pedal pulses are 2+ and equal bilaterally. Neuro: Alert and oriented X 3. No facial asymmetry. No focal deficit. Moves all extremities spontaneously. Psych:  Responds to questions appropriately with a normal affect.    Assessment and Plan:   1. Elevated troponin: -Likely supply demand ischemia in the setting of patient's acute on chronic respiratory failure and sepsis secondary to left basilar pneumonia  -Troponin mildly elevated and down trending -No anginal symptoms  -Check echo to evaluate LV systolic function and wall motion to compare to study done 02/04/2015. If stable, no further inpatient cardiac evaluation at this time  2. Acute on chronic respiratory failure with hypoxia: -Secondary to patient's left basilar pneumonia and COPD exacerbation -Wean off NRB as able -On 2L via nasal cannula at home -Nebs/inhalers per IM  3. Sepsis in the setting of left basilar pneumonia: -On vancomycin per IM -As above  4. Acute on chronic anemia: -Recommend transfuse to maintain a hgb >8 to 8.5  5. Essential thrombocytosis: -Stable -Per oncology  6. History of hyponatremia: -Stable -Close monitoring  7. History of throat cancer: -Status post feeding tube  8. HTN: -Poorly controlled -Likely in  the setting of the above -Watch for hypotension with his sepsis -Monitor    Signed, Christell Faith, PA-C Pager: [phone] 03/03/2015, 8:35 AM  I have seen and examined this patient with Christell Faith.  Agree with above, note added to reflect my findings.  On exam, regular rhythm, no murmurs, lungs with wheezes, no edema.    Presented to the hospital with weakness and a rattling in his chest for a month. He had no cough. He has been noting  subjective fevers chills and myalgias. He's had no chest pain palpitations edema orthopnea PND. He was found to have a troponin of 0.56 that has gone down to 0.27. EKG showed tachycardia at 116 with APCs. He also says had some inferolateral ST and T-wave changes chest x-ray showed a left basilar pneumonia and he was started on vancomycin and he's not had chest pain. At this time it seems that his elevated troponin is likely due to demand ischemia. Agree with checking another TTE to determine if he has had a change in his cardiac function. If he has not, then no further inpatient workup is necessary.  Will M. Camnitz MD 03/03/2015 11:17 AM

## 2015-03-03 NOTE — Progress Notes (Signed)
[facility]  Date of admission:  03/02/2015  Inpatient day:  03/03/2015  Consulting physician:  Demetrios Loll, MD   Reason for Consultation:  Cancer.  Chief Complaint: Johnny Sanford is a 75 y.o. male with a history of salivary gland tumor and essential thrombocytosis (ET) who was admitted with a left lower lobe pneumonia.  HPI:  The patient's wife provides most of the history.  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  He was treated with chemotherapy and radiation. In approximately 1997, he was diagnosed with essential thrombocytosis (ET).  He has been on hydroxyurea since that time. He has taken 1500 mg a day orally.  Since 02/09/2015, he has taken his medication via his feeding tube. His wife notes that he was off his hydroxyurea for week or so because of poor tolerance. Recently, he has started giving him the hydroxyurea 3 pills (total 1500 mg) at night.  He can now tolerate his medication again.  The patient's wife notes an admission to the hospital on 02/02/2015 for hyponatremia. He has had recurrent episodes of hyponatremia for the past 3 years. During that admission he choked and coded.  Swallowing study revealed aspiration. He's not been able to drink or anything by mouth since that time.  A feeding tube was placed on 02/09/2015. She believes that he has scar tissue in his esophagus or a cervical spine issue.  Following his last discharge from the hospital, platelet count was 337,000. Platelet goal is less than 400,000.  The patient was seen by Dr. Carloyn Manner of ENT on 03/02/2015.  He was wheezing.  He is sent for chest x-ray.  Because of cough and shortness of breath, he presented to the emergency room.  He was diagnosed with a left lower lobe pneumonia.  Past Medical History  Diagnosis Date  . HLD (hyperlipidemia)   . HTN (hypertension)   . GERD (gastroesophageal reflux disease)   . Hypothyroidism   . Cancer ([place])  1991    sqauamous cell ca  . CVA (cerebral vascular accident) ([place])   . DA (degenerative arthritis)   . RA (rheumatoid arthritis) ([place])   . Adrenal insufficiency ([place])   . Cervical spinal stenosis   . Falls   . Collagen vascular disease ([place])   . COPD (chronic obstructive pulmonary disease) ([facility])     mild    Past Surgical History  Procedure Laterality Date  . Carotid endarterectomy      left  . Skin lesion excision      Dr. Sarajane Jews, and Dr. Vickki Muff at [facility]  . Peg placement N/A 02/09/2015    Procedure: PERCUTANEOUS ENDOSCOPIC GASTROSTOMY (PEG) PLACEMENT;  Surgeon: Hulen Luster, MD;  Location: [facility] ENDOSCOPY;  Service: Endoscopy;  Laterality: N/A;    Family History  Problem Relation Age of Onset  . Heart disease Father     Social History:  reports that he quit smoking about 13 years ago. His smoking use included Cigarettes. He quit after 15 years of use. He has never used smokeless tobacco. He reports that he does not drink alcohol or use illicit drugs.  He is accompanied by his wife.  Allergies:  Allergies  Allergen Reactions  . Simvastatin Anxiety    Medications Prior to Admission  Medication Sig Dispense Refill  . aspirin 81 MG EC tablet 162 mg by Feeding Tube route daily.     . clonazePAM (KLONOPIN) 0.5 MG tablet 0.5 mg by Feeding Tube  route 3 (three) times daily as needed for anxiety.    Marland Kitchen escitalopram (LEXAPRO) 20 MG tablet 20 mg by Feeding Tube route daily.     . fludrocortisone (FLORINEF) 0.1 MG tablet 0.1 mg by Feeding Tube route daily.    . fluorouracil (EFUDEX) 5 % cream Apply 1 application topically as needed (for skin spots).     . fluticasone (FLONASE) 50 MCG/ACT nasal spray Place 1 spray into both nostrils daily as needed for rhinitis. 16 g 6  . Fluticasone Furoate-Vilanterol (BREO ELLIPTA) 200-25 MCG/INH AEPB Inhale 200 mcg into the lungs daily. 1 each 6  . HYDROcodone-acetaminophen (NORCO/VICODIN) 5-325 MG tablet Place 1 tablet into feeding tube every 4 (four)  hours as needed for moderate pain. 30 tablet 0  . hydroxyurea (HYDREA) 500 MG capsule 1,500 mg by Feeding Tube route at bedtime.    Marland Kitchen ipratropium-albuterol (DUONEB) 0.5-2.5 (3) MG/3ML SOLN Take 3 mLs by nebulization every 4 (four) hours as needed. (Patient taking differently: Take 3 mLs by nebulization every 4 (four) hours as needed (for wheezing/shortness of breath). ) 360 mL 6  . levothyroxine (SYNTHROID, LEVOTHROID) 137 MCG tablet 137 mcg by Feeding Tube route daily before breakfast.    . Nutritional Supplements (FEEDING SUPPLEMENT, JEVITY 1.5 CAL/FIBER,) LIQD Place 360 mLs into feeding tube 4 (four) times daily. 120 mL 6  . ranitidine (ZANTAC) 150 MG tablet 150 mg by Feeding Tube route 2 (two) times daily.      Review of Systems: GENERAL:  Fatigue.  Chills.  No fevers, sweats or weight loss. PERFORMANCE STATUS (ECOG):  2 HEENT:  No visual changes, runny nose, sore throat, mouth sores or tenderness. Lungs: Sshortness of breath.  Cough.  Wheezing.  No hemoptysis. Cardiac:  No chest pain, palpitations, orthopnea, or PND. GI:  Unable to eat by mouth secondary to aspiration risk.  Feeding tube.  No nausea, vomiting, diarrhea, constipation, melena or hematochezia. GU:  No urgency, frequency, dysuria, or hematuria. Musculoskeletal:  No back pain.  No joint pain.  No muscle tenderness. Extremities:  No pain or swelling. Skin:  No rashes or skin changes. Neuro:  No headache, numbness or weakness, balance or coordination issues. Endocrine:  No diabetes.  Thyroid issues on Synthroid.  No hot flashes or night sweats. Psych:  No mood changes, depression or anxiety. Pain:  No focal pain. Review of systems:  All other systems reviewed and found to be negative.  Physical Exam:  Blood pressure 131/71, pulse 105, temperature 98.1 F (36.7 C), temperature source Oral, resp. rate 22, height _0  (1.626 m), weight 143 lb 8 oz (65.091 kg), SpO2 93 %.  GENERAL:  Thin elderly gentleman lying comfortably on  the medical unit in no acute distress. MENTAL STATUS:  Alert and oriented to person, place and time. HEAD:  Short blonde hair.  Normocephalic, atraumatic, face symmetric, no Cushingoid features. EYES:  Blue eyes.  Pupils equal round and reactive to light and accomodation.  No conjunctivitis or scleral icterus. ENT:  [place] in place.  Oropharynx clear without lesion.  Tongue normal. Mucous membranes moist.  RESPIRATORY:  Fine crackles left lower lobe.   No wheezes or rhonchi. CARDIOVASCULAR:  Regular rate and rhythm without murmur, rub or gallop. ABDOMEN:  PEG tube.  Soft, non-tender, with active bowel sounds, and no hepatosplenomegaly.  No masses. SKIN:  Ecchymosis on arms.  Chronic skin changes.  No rashes, ulcers or lesions. EXTREMITIES: Thin extremities.  Arthritis changes in hands.  No edema, no skin discoloration or tenderness.  No palpable cords. LYMPH NODES: No palpable cervical, supraclavicular, axillary or inguinal adenopathy  NEUROLOGICAL: Unremarkable. PSYCH:  Appropriate.  Results for orders placed or performed during the hospital encounter of 03/02/15 (from the past 48 hour(s))  Urinalysis complete, with microscopic ([facility] only)     Status: Abnormal   Collection Time: 03/02/15  3:18 PM  Result Value Ref Range   Color, Urine YELLOW (A) YELLOW   APPearance HAZY (A) CLEAR   Glucose, UA NEGATIVE NEGATIVE mg/dL   Bilirubin Urine NEGATIVE NEGATIVE   Ketones, ur TRACE (A) NEGATIVE mg/dL   Specific Gravity, Urine 1.030 1.005 - 1.030   Hgb urine dipstick NEGATIVE NEGATIVE   pH 6.0 5.0 - 8.0   Protein, ur 100 (A) NEGATIVE mg/dL   Nitrite NEGATIVE NEGATIVE   Leukocytes, UA NEGATIVE NEGATIVE   RBC / HPF 0-5 0 - 5 RBC/hpf   WBC, UA 0-5 0 - 5 WBC/hpf   Bacteria, UA RARE (A) NONE SEEN   Squamous Epithelial / LPF NONE SEEN NONE SEEN   Mucous PRESENT   Urine culture     Status: None (Preliminary result)   Collection Time: 03/02/15  3:18 PM  Result Value Ref Range   Specimen Description  URINE, RANDOM    Special Requests NONE    Culture NO GROWTH < 12 HOURS    Report Status PENDING   Blood Culture (routine x 2)     Status: None (Preliminary result)   Collection Time: 03/02/15  3:45 PM  Result Value Ref Range   Specimen Description BLOOD RIGHT FOREARM    Special Requests BOTTLES DRAWN AEROBIC AND ANAEROBIC  3CC    Culture  Setup Time      GRAM NEGATIVE RODS IN BOTH AEROBIC AND ANAEROBIC BOTTLES IDENTIFIED AS SERRATIA MARCESCENS CRITICAL RESULT CALLED TO, READ BACK BY AND VERIFIED WITH: Roe Coombs 03/03/2015 0844 BY J RAZZAKSUAREZ,MT    Culture      SERRATIA MARCESCENS IN BOTH AEROBIC AND ANAEROBIC BOTTLES SUSCEPTIBILITIES TO FOLLOW    Report Status PENDING   Blood Culture ID Panel (Reflexed)     Status: Abnormal   Collection Time: 03/02/15  3:45 PM  Result Value Ref Range   Enterococcus species NOT DETECTED NOT DETECTED   Listeria monocytogenes NOT DETECTED NOT DETECTED   Staphylococcus species NOT DETECTED NOT DETECTED   Staphylococcus aureus NOT DETECTED NOT DETECTED   Streptococcus species NOT DETECTED NOT DETECTED   Streptococcus agalactiae NOT DETECTED NOT DETECTED   Streptococcus pneumoniae NOT DETECTED NOT DETECTED   Streptococcus pyogenes NOT DETECTED NOT DETECTED   Acinetobacter baumannii NOT DETECTED NOT DETECTED   Enterobacteriaceae species DETECTED (A) NOT DETECTED    Comment: CRITICAL RESULT CALLED TO, READ BACK BY AND VERIFIED WITH: Roe Coombs 03/03/2015 0844 BY J RAZZAKSUAREZ,MT    Enterobacter cloacae complex NOT DETECTED NOT DETECTED   Escherichia coli NOT DETECTED NOT DETECTED   Klebsiella oxytoca NOT DETECTED NOT DETECTED   Klebsiella pneumoniae NOT DETECTED NOT DETECTED   Proteus species NOT DETECTED NOT DETECTED   Serratia marcescens DETECTED (A) NOT DETECTED    Comment: CRITICAL RESULT CALLED TO, READ BACK BY AND VERIFIED WITH: Roe Coombs 03/03/2015 0844 BY J RAZZAKSUAREZ,MT    Haemophilus influenzae NOT DETECTED NOT  DETECTED   Neisseria meningitidis NOT DETECTED NOT DETECTED   Pseudomonas aeruginosa NOT DETECTED NOT DETECTED   Candida albicans NOT DETECTED NOT DETECTED   Candida glabrata NOT DETECTED NOT DETECTED   Candida krusei NOT DETECTED NOT DETECTED   Candida parapsilosis  NOT DETECTED NOT DETECTED   Candida tropicalis NOT DETECTED NOT DETECTED   Carbapenem resistance NOT DETECTED NOT DETECTED   Methicillin resistance NOT DETECTED NOT DETECTED   Vancomycin resistance NOT DETECTED NOT DETECTED  Blood Culture (routine x 2)     Status: None (Preliminary result)   Collection Time: 03/02/15  3:53 PM  Result Value Ref Range   Specimen Description BLOOD RIGHT ANTECUBITAL    Special Requests BOTTLES DRAWN AEROBIC AND ANAEROBIC  3CC    Culture  Setup Time      GRAM NEGATIVE RODS AEROBIC BOTTLE ONLY CRITICAL VALUE NOTED.  VALUE IS CONSISTENT WITH PREVIOUSLY REPORTED AND CALLED VALUE.    Culture      GRAM NEGATIVE RODS AEROBIC BOTTLE ONLY IDENTIFICATION TO FOLLOW    Report Status PENDING   Lactic acid, plasma     Status: None   Collection Time: 03/02/15  3:54 PM  Result Value Ref Range   Lactic Acid, Venous 1.6 0.5 - 2.0 mmol/L  Comprehensive metabolic panel     Status: Abnormal   Collection Time: 03/02/15  3:56 PM  Result Value Ref Range   Sodium 131 (L) 135 - 145 mmol/L   Potassium 4.2 3.5 - 5.1 mmol/L   Chloride 90 (L) 101 - 111 mmol/L   CO2 34 (H) 22 - 32 mmol/L   Glucose, Bld 140 (H) 65 - 99 mg/dL   BUN 26 (H) 6 - 20 mg/dL   Creatinine, Ser 0.55 (L) 0.61 - 1.24 mg/dL   Calcium 8.4 (L) 8.9 - 10.3 mg/dL   Total Protein 7.1 6.5 - 8.1 g/dL   Albumin 3.1 (L) 3.5 - 5.0 g/dL   AST 19 15 - 41 U/L   ALT 21 17 - 63 U/L   Alkaline Phosphatase 52 38 - 126 U/L   Total Bilirubin 0.5 0.3 - 1.2 mg/dL   GFR calc non Af Amer >60 >60 mL/min   GFR calc Af Amer >60 >60 mL/min    Comment: (NOTE) The eGFR has been calculated using the CKD EPI equation. This calculation has not been validated in all  clinical situations. eGFR's persistently <60 mL/min signify possible Chronic Kidney Disease.    Anion gap 7 5 - 15  Troponin I     Status: Abnormal   Collection Time: 03/02/15  3:56 PM  Result Value Ref Range   Troponin I 0.56 (H) <0.031 ng/mL    Comment: READ BACK AND VERIFIED WITH JERRIE THOMPSON AT 1636 03/02/2015 BY TFK        POSSIBLE MYOCARDIAL ISCHEMIA. SERIAL TESTING RECOMMENDED.   CBC WITH DIFFERENTIAL     Status: Abnormal   Collection Time: 03/02/15  3:56 PM  Result Value Ref Range   WBC 3.8 3.8 - 10.6 K/uL   RBC 1.99 (L) 4.40 - 5.90 MIL/uL   Hemoglobin 8.5 (L) 13.0 - 18.0 g/dL   HCT 25.5 (L) 40.0 - 52.0 %   MCV 128.0 (H) 80.0 - 100.0 fL   MCH 42.6 (H) 26.0 - 34.0 pg   MCHC 33.3 32.0 - 36.0 g/dL   RDW 15.2 (H) 11.5 - 14.5 %   Platelets 924 (HH) 150 - 440 K/uL    Comment: CRITICAL RESULT CALLED TO, READ BACK BY AND VERIFIED WITH:  JERRIE THOMPSON AT 1635 03/02/15 SDR    Neutrophils Relative % 69% %   Neutro Abs 2.7 1.4 - 6.5 K/uL   Lymphocytes Relative 5% %   Lymphs Abs 0.2 (L) 1.0 - 3.6 K/uL  Monocytes Relative 26% %   Monocytes Absolute 1.0 0.2 - 1.0 K/uL   Eosinophils Relative 0% %   Eosinophils Absolute 0.0 0 - 0.7 K/uL   Basophils Relative 0% %   Basophils Absolute 0.0 0 - 0.1 K/uL  Protime-INR     Status: Abnormal   Collection Time: 03/02/15  3:56 PM  Result Value Ref Range   Prothrombin Time 15.9 (H) 11.4 - 15.0 seconds   INR 1.26   Lactic acid, plasma     Status: None   Collection Time: 03/02/15  8:59 PM  Result Value Ref Range   Lactic Acid, Venous 1.5 0.5 - 2.0 mmol/L  Troponin I     Status: Abnormal   Collection Time: 03/02/15  8:59 PM  Result Value Ref Range   Troponin I 0.42 (H) <0.031 ng/mL    Comment: PREVIOUS RESULT CALLED BY TFK AT 1636 ON 03/02/15 RWW        PERSISTENTLY INCREASED TROPONIN VALUES IN THE RANGE OF 0.04-0.49 ng/mL CAN BE SEEN IN:       -UNSTABLE ANGINA       -CONGESTIVE HEART FAILURE       -MYOCARDITIS       -CHEST  TRAUMA       -ARRYHTHMIAS       -LATE PRESENTING MYOCARDIAL INFARCTION       -COPD   CLINICAL FOLLOW-UP RECOMMENDED.   Glucose, capillary     Status: Abnormal   Collection Time: 03/03/15  2:57 AM  Result Value Ref Range   Glucose-Capillary 146 (H) 65 - 99 mg/dL  Basic metabolic panel     Status: Abnormal   Collection Time: 03/03/15  3:16 AM  Result Value Ref Range   Sodium 132 (L) 135 - 145 mmol/L   Potassium 4.7 3.5 - 5.1 mmol/L   Chloride 92 (L) 101 - 111 mmol/L   CO2 34 (H) 22 - 32 mmol/L   Glucose, Bld 169 (H) 65 - 99 mg/dL   BUN 20 6 - 20 mg/dL   Creatinine, Ser 0.53 (L) 0.61 - 1.24 mg/dL   Calcium 8.4 (L) 8.9 - 10.3 mg/dL   GFR calc non Af Amer >60 >60 mL/min   GFR calc Af Amer >60 >60 mL/min    Comment: (NOTE) The eGFR has been calculated using the CKD EPI equation. This calculation has not been validated in all clinical situations. eGFR's persistently <60 mL/min signify possible Chronic Kidney Disease.    Anion gap 6 5 - 15  CBC     Status: Abnormal   Collection Time: 03/03/15  3:16 AM  Result Value Ref Range   WBC 3.5 (L) 3.8 - 10.6 K/uL   RBC 1.91 (L) 4.40 - 5.90 MIL/uL   Hemoglobin 8.2 (L) 13.0 - 18.0 g/dL   HCT 24.5 (L) 40.0 - 52.0 %   MCV 128.4 (H) 80.0 - 100.0 fL   MCH 43.0 (H) 26.0 - 34.0 pg   MCHC 33.4 32.0 - 36.0 g/dL   RDW 15.4 (H) 11.5 - 14.5 %   Platelets 864 (H) 150 - 440 K/uL  Troponin I     Status: Abnormal   Collection Time: 03/03/15  3:16 AM  Result Value Ref Range   Troponin I 0.27 (H) <0.031 ng/mL    Comment: PREVIOUS RESULT CALLED BY TFK AT 1636 ON 03/02/15 RWW        PERSISTENTLY INCREASED TROPONIN VALUES IN THE RANGE OF 0.04-0.49 ng/mL CAN BE SEEN IN:       -  UNSTABLE ANGINA       -CONGESTIVE HEART FAILURE       -MYOCARDITIS       -CHEST TRAUMA       -ARRYHTHMIAS       -LATE PRESENTING MYOCARDIAL INFARCTION       -COPD   CLINICAL FOLLOW-UP RECOMMENDED.   Troponin I     Status: Abnormal   Collection Time: 03/03/15  8:42 AM   Result Value Ref Range   Troponin I 0.21 (H) <0.031 ng/mL    Comment: PREVIOUS RESULT CALLED AT 1636 ON  03/02/15 BY TFK.Marland KitchenMarland KitchenSDR        PERSISTENTLY INCREASED TROPONIN VALUES IN THE RANGE OF 0.04-0.49 ng/mL CAN BE SEEN IN:       -UNSTABLE ANGINA       -CONGESTIVE HEART FAILURE       -MYOCARDITIS       -CHEST TRAUMA       -ARRYHTHMIAS       -LATE PRESENTING MYOCARDIAL INFARCTION       -COPD   CLINICAL FOLLOW-UP RECOMMENDED.    Dg Chest 2 View  03/02/2015  CLINICAL DATA:  75 year old male with possible aspiration. EXAM: CHEST  2 VIEW COMPARISON:  None. FINDINGS: Mild cardiomegaly again noted scratch a mild cardiomegaly and peribronchial thickening again noted. Mildly increased left lower lobe opacity may represent atelectasis, aspiration or pneumonia. There is no evidence of pleural effusion, pneumothorax, edema or acute bony abnormality. IMPRESSION: Mild left lower lobe opacity which may represent atelectasis, or pneumonia. Radiographic follow-up to resolution is recommended. Electronically Signed   By: Margarette Canada M.D.   On: 03/02/2015 14:14   Dg Chest Port 1 View  03/02/2015  CLINICAL DATA:  Sepsis. EXAM: PORTABLE CHEST 1 VIEW COMPARISON:  Same day. FINDINGS: Stable cardiomegaly is noted. No pneumothorax is noted. Right lung is clear. Increased left basilar opacity is noted concerning for worsening pneumonia or atelectasis with possible associated pleural effusion. Bony thorax is unremarkable. IMPRESSION: Increased left basilar opacity concerning for worsening pneumonia or atelectasis with possible associated pleural effusion. Electronically Signed   By: Marijo Conception, M.D.   On: 03/02/2015 18:29    Assessment:  The patient is a 75 y.o. 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  He was admitted with a left lower lobe pneumonia.  Blood cultures are positive for Serratia marcescens.  He has a  history of aspiration and takes nothing by mouth.  Platelet count on admission was 924,000 and is currently 864,000.  Etiology likely secondary to acute phase reactant and patient's ET (off hydroxyurea x 1 week recently).  Suspect platelet count will improve daily with management of infection.  ANC adequate (3800).  Plan:   1.  CBC with diff daily. 2.  Continue hydroxyurea 1500 mg a day (at night through feeding tube). 3.  Maintain active type and screen as hematocrit borderline.  If blood products are needed- leukopoor and irradiated.   Thank you for allowing me to participate in Calcium 's care.  I will follow him closely with you while hospitalized.  He will follow-up with Dr. Grayland Ormond, his oncologist, after discharge to the outpatient department.  Lequita Asal, MD  03/03/2015, 4:02 PM

## 2015-03-03 NOTE — Progress Notes (Signed)
Initial Nutrition Assessment     INTERVENTION:  EN: Spoke with wife and pt and has been doing bolus tube feeding at home.  Dr Tressia Miners agreeable to switching to bolus feeding at this time during admission. Recommend jevity 1.5 375ml 4 times per day. Provides 2160 kcals, 92 g of protein and 1094 ml free water. Additional free water of 1106ml with medications in am and 18ml before and after each feeding.  Wife reports Dr Juleen China wanted pt to receive 20 oz of free water per day (654ml/d). Tube feeding regimen printed and left at bedside for wife with free water flush listed.    NUTRITION DIAGNOSIS:   Inadequate enteral nutrition infusion related to chronic illness, acute illness as evidenced by estimated needs.   GOAL:   Patient will meet greater than or equal to 90% of their needs    MONITOR:    (Energy intake, Electrolyte and renal profile)  REASON FOR ASSESSMENT:   Consult Enteral/tube feeding initiation and management  ASSESSMENT:     Pt admitted with pneumonia, shortness of breath, bactermia  Past Medical History  Diagnosis Date  . HLD (hyperlipidemia)   . HTN (hypertension)   . GERD (gastroesophageal reflux disease)   . Hypothyroidism   . Cancer ([place]) 1991    sqauamous cell ca  . CVA (cerebral vascular accident) ([place])   . DA (degenerative arthritis)   . RA (rheumatoid arthritis) ([place])   . Adrenal insufficiency ([place])   . Cervical spinal stenosis   . Falls   . Collagen vascular disease ([place])   . COPD (chronic obstructive pulmonary disease) ([facility])     mild    Current Nutrition: tolerating jevity 1.2 at 75ml/hr continuous via PEG  Food/Nutrition-Related History: Spoke with wife via phone. Reports has been giving water with medication in am then tube feeding of 360 ml (1 and 1/2 cans) 4 times per day and has been tolerating well. Reports Dr Juleen China wants pt to receive 20oz of water per day (ie 666ml)    Scheduled Medications:  . aspirin  324 mg Per Tube Once   . aspirin EC  81 mg Oral Daily  . cefTRIAXone (ROCEPHIN)  IV  2 g Intravenous Q24H  . escitalopram  20 mg Per Tube Daily  . famotidine  20 mg Per Tube Daily  . fludrocortisone  0.1 mg Per Tube Daily  . heparin  5,000 Units Subcutaneous 3 times per day  . hydroxyurea  1,500 mg Per Tube QHS  . levothyroxine  137 mcg Per Tube QAC breakfast  . mometasone-formoterol  2 puff Inhalation BID  . sodium chloride flush  3 mL Intravenous Q12H    Continuous Medications:  . feeding supplement (JEVITY 1.2 CAL) 1,000 mL (03/03/15 0211)     Electrolyte/Renal Profile and Glucose Profile:   Recent Labs Lab 02/28/15 2100 03/02/15 1556 03/03/15 0316  NA 131* 131* 132*  K 4.2 4.2 4.7  CL 92* 90* 92*  CO2 33* 34* 34*  BUN 22* 26* 20  CREATININE 0.58* 0.55* 0.53*  CALCIUM 8.1* 8.4* 8.4*  GLUCOSE 120* 140* 169*   Protein Profile:  Recent Labs Lab 03/02/15 1556  ALBUMIN 3.1*    Gastrointestinal Profile: Last BM: 2/3   Nutrition-Focused Physical Exam Findings:  Unable to complete Nutrition-Focused physical exam at this time.  Deferred at this time   Weight Change: 6% wt loss in the last 1 1/2 months    Diet Order:  Diet NPO time specified  Skin:   reviewed  Height:   Ht Readings from Last 1 Encounters:  03/02/15 5\' 4"  (1.626 m)    Weight:   Wt Readings from Last 1 Encounters:  03/03/15 143 lb 8 oz (65.091 kg)    Ideal Body Weight:     BMI:  Body mass index is 24.62 kg/(m^2).  Estimated Nutritional Needs:   Kcal:  BEE 1306 kcals (IF 1.0-1.3, AF 1.3) [id number] kcals/d  Protein:  (1.0-1.2 g/kg) 65-78 g/d  Fluid:  (25-105ml/kg) 1625-1952ml/d  EDUCATION NEEDS:   No education needs identified at this time  HIGH Care Level  [name], [place], [place] (pager) Weekend/On-Call pager [phone])

## 2015-03-03 NOTE — Progress Notes (Signed)
[facility] [place] NAME: Johnny Sanford    MR#:  [id number]  DATE OF BIRTH:  1940-05-04  SUBJECTIVE:  CHIEF COMPLAINT:   Chief Complaint  Patient presents with  . Shortness of Breath   - admitted from home due to worsening shortness of breath.  -Now on NRB mask, wheezing and coughing  REVIEW OF SYSTEMS:  Review of Systems  Constitutional: Positive for malaise/fatigue. Negative for fever and chills.  HENT: Negative for ear discharge, ear pain and nosebleeds.   Eyes: Negative for blurred vision.  Respiratory: Positive for cough, shortness of breath and wheezing.   Cardiovascular: Negative for chest pain, palpitations and leg swelling.  Gastrointestinal: Negative for nausea, vomiting, abdominal pain, diarrhea and constipation.  Genitourinary: Negative for dysuria.  Musculoskeletal: Negative for myalgias.  Neurological: Negative for dizziness, sensory change, speech change, focal weakness, seizures and headaches.  Psychiatric/Behavioral: Negative for depression.    DRUG ALLERGIES:   Allergies  Allergen Reactions  . Simvastatin Anxiety    VITALS:  Blood pressure 164/98, pulse 92, temperature 97.9 F (36.6 C), temperature source Oral, resp. rate 22, height 5\' 4"  (1.626 m), weight 65.091 kg (143 lb 8 oz), SpO2 100 %.  PHYSICAL EXAMINATION:  Physical Exam  GENERAL:  75 y.o.-year-old patient lying in the bed with no acute distress.  EYES: Pupils equal, round, reactive to light and accommodation. No scleral icterus. Extraocular muscles intact.  HEENT: Head atraumatic, normocephalic. Oropharynx and nasopharynx clear.  NECK:  Supple, no jugular venous distention. No thyroid enlargement, no tenderness.  LUNGS: Diffuse scattered expiratory wheezing heard all over the lung fields. No rales,rhonchi or crepitation. No use of accessory muscles of respiration.  CARDIOVASCULAR: S1, S2 normal. No murmurs, rubs, or gallops.  ABDOMEN: Soft,  nontender, nondistended. Bowel sounds present. No organomegaly or mass.  EXTREMITIES: No pedal edema, cyanosis, or clubbing.  NEUROLOGIC: Cranial nerves II through XII are intact. Muscle strength 5/5 in all extremities. Sensation intact. Gait not checked.  PSYCHIATRIC: The patient is alert and oriented x 3.  SKIN: No obvious rash, lesion, or ulcer.    LABORATORY PANEL:   CBC  Recent Labs Lab 03/03/15 0316  WBC 3.5*  HGB 8.2*  HCT 24.5*  PLT 864*   ------------------------------------------------------------------------------------------------------------------  Chemistries   Recent Labs Lab 03/02/15 1556 03/03/15 0316  NA 131* 132*  K 4.2 4.7  CL 90* 92*  CO2 34* 34*  GLUCOSE 140* 169*  BUN 26* 20  CREATININE 0.55* 0.53*  CALCIUM 8.4* 8.4*  AST 19  --   ALT 21  --   ALKPHOS 52  --   BILITOT 0.5  --    ------------------------------------------------------------------------------------------------------------------  Cardiac Enzymes  Recent Labs Lab 03/03/15 0842  TROPONINI 0.21*   ------------------------------------------------------------------------------------------------------------------  RADIOLOGY:  Dg Chest 2 View  03/02/2015  CLINICAL DATA:  75 year old male with possible aspiration. EXAM: CHEST  2 VIEW COMPARISON:  None. FINDINGS: Mild cardiomegaly again noted scratch a mild cardiomegaly and peribronchial thickening again noted. Mildly increased left lower lobe opacity may represent atelectasis, aspiration or pneumonia. There is no evidence of pleural effusion, pneumothorax, edema or acute bony abnormality. IMPRESSION: Mild left lower lobe opacity which may represent atelectasis, or pneumonia. Radiographic follow-up to resolution is recommended. Electronically Signed   By: Margarette Canada M.D.   On: 03/02/2015 14:14   Dg Chest Port 1 View  03/02/2015  CLINICAL DATA:  Sepsis. EXAM: PORTABLE CHEST 1 VIEW COMPARISON:  Same day. FINDINGS: Stable cardiomegaly is  noted. No pneumothorax is noted. Right lung is clear. Increased left basilar opacity is noted concerning for worsening pneumonia or atelectasis with possible associated pleural effusion. Bony thorax is unremarkable. IMPRESSION: Increased left basilar opacity concerning for worsening pneumonia or atelectasis with possible associated pleural effusion. Electronically Signed   By: Marijo Conception, M.D.   On: 03/02/2015 18:29    EKG:   Orders placed or performed during the hospital encounter of 03/02/15  . ED EKG 12-Lead  . ED EKG 12-Lead  . EKG 12-Lead  . 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  #1 acute on chronic hypoxic respiratory failure-significant wheezing on exam today. -Started on IV Solu-Medrol twice a day, continued to on nebs. -Chest x-ray with new left-sided infiltrate possible pneumonia. -Continue Rocephin for healthcare acquired pneumonia -Blood cultures with the possible Serratia and Enterobacteriaceae-follow final cultures  #2. Adrenal insufficiency-decrease Florinef to home dosage  #3 depression and anxiety-continue Lexapro and Klonopin  #4 hypothyroidism-continue Synthroid  #5 thrombocythemia-elevated platelet count. Continue hydroxyurea  #6 dysphagia-recent admission in January 2016 and noted to have failed modified barium swallow study twice. -Has a PEG tube. Appreciate dietitian consult for bolus feeds  #7 hypertension-usually takes Florinef, however her blood pressure elevated now. Also on steroids. -Started on low-dose Norvasc and also IV hydralazine when necessary  #8 DVT prophylaxis-subcutaneous heparin   All the records are reviewed and  case discussed with Care Management/Social Workerr. Management plans discussed with the patient, family and they are in agreement.  CODE STATUS: Full Code  TOTAL TIME TAKING CARE OF THIS PATIENT: 38  minutes.   POSSIBLE D/C IN 3 DAYS, DEPENDING ON CLINICAL CONDITION.   Keiana Tavella M.D on 03/03/2015 at 12:03 PM  Between 7am to 6pm - Pager - [phone]  After 6pm go to [email or web address] - password [email or web address] [facility]  Office  [phone]  CC: Primary care physician; Rica Mast, MD

## 2015-03-04 ENCOUNTER — Inpatient Hospital Stay: Payer: Medicare Other

## 2015-03-04 ENCOUNTER — Encounter: Payer: Self-pay | Admitting: Internal Medicine

## 2015-03-04 DIAGNOSIS — D649 Anemia, unspecified: Secondary | ICD-10-CM | POA: Insufficient documentation

## 2015-03-04 DIAGNOSIS — J441 Chronic obstructive pulmonary disease with (acute) exacerbation: Secondary | ICD-10-CM

## 2015-03-04 LAB — GLUCOSE, CAPILLARY
GLUCOSE-CAPILLARY: 134 mg/dL — AB (ref 65–99)
GLUCOSE-CAPILLARY: 152 mg/dL — AB (ref 65–99)
GLUCOSE-CAPILLARY: 204 mg/dL — AB (ref 65–99)
Glucose-Capillary: 135 mg/dL — ABNORMAL HIGH (ref 65–99)
Glucose-Capillary: 171 mg/dL — ABNORMAL HIGH (ref 65–99)
Glucose-Capillary: 175 mg/dL — ABNORMAL HIGH (ref 65–99)
Glucose-Capillary: 252 mg/dL — ABNORMAL HIGH (ref 65–99)
Glucose-Capillary: 303 mg/dL — ABNORMAL HIGH (ref 65–99)

## 2015-03-04 LAB — CBC
HEMATOCRIT: 25.6 % — AB (ref 40.0–52.0)
Hemoglobin: 8.5 g/dL — ABNORMAL LOW (ref 13.0–18.0)
MCH: 42.2 pg — AB (ref 26.0–34.0)
MCHC: 33.2 g/dL (ref 32.0–36.0)
MCV: 127.2 fL — ABNORMAL HIGH (ref 80.0–100.0)
Platelets: 1065 10*3/uL (ref 150–440)
RBC: 2.02 MIL/uL — ABNORMAL LOW (ref 4.40–5.90)
RDW: 15.4 % — AB (ref 11.5–14.5)
WBC: 4.6 10*3/uL (ref 3.8–10.6)

## 2015-03-04 LAB — BASIC METABOLIC PANEL
Anion gap: 9 (ref 5–15)
BUN: 22 mg/dL — AB (ref 6–20)
CALCIUM: 8.4 mg/dL — AB (ref 8.9–10.3)
CHLORIDE: 91 mmol/L — AB (ref 101–111)
CO2: 34 mmol/L — AB (ref 22–32)
CREATININE: 0.51 mg/dL — AB (ref 0.61–1.24)
GFR calc non Af Amer: >60 mL/min (ref >60)
GLUCOSE: 148 mg/dL — AB (ref 65–99)
Potassium: 4.3 mmol/L (ref 3.5–5.1)
Sodium: 134 mmol/L — ABNORMAL LOW (ref 135–145)

## 2015-03-04 LAB — ABO/RH: ABO/RH(D): A POS

## 2015-03-04 LAB — TYPE AND SCREEN
ABO/RH(D): A POS
Antibody Screen: NEGATIVE

## 2015-03-04 LAB — URINE CULTURE

## 2015-03-04 MED ORDER — INSULIN ASPART 100 UNIT/ML ~~LOC~~ SOLN: 0.0000 [IU] | Freq: Every day | SUBCUTANEOUS | Status: DC

## 2015-03-04 MED ORDER — MORPHINE SULFATE (PF) 2 MG/ML IV SOLN
2.0000 mg | Freq: Once | INTRAVENOUS | Status: AC
  Administered 2015-03-06: 2 mg via INTRAVENOUS
  Filled 2015-03-04: qty 1

## 2015-03-04 MED ORDER — MORPHINE SULFATE (PF) 2 MG/ML IV SOLN
INTRAVENOUS | Status: AC
  Filled 2015-03-04: qty 1

## 2015-03-04 MED ORDER — METOPROLOL TARTRATE 1 MG/ML IV SOLN
INTRAVENOUS | Status: AC
  Filled 2015-03-04: qty 5

## 2015-03-04 MED ORDER — METOPROLOL TARTRATE 1 MG/ML IV SOLN
5.0000 mg | Freq: Once | INTRAVENOUS | Status: AC
  Administered 2015-03-04: 5 mg via INTRAVENOUS

## 2015-03-04 MED ORDER — INSULIN ASPART 100 UNIT/ML ~~LOC~~ SOLN
0.0000 [IU] | Freq: Three times a day (TID) | SUBCUTANEOUS | Status: DC
  Administered 2015-03-04: 1 [IU] via SUBCUTANEOUS
  Administered 2015-03-04: 3 [IU] via SUBCUTANEOUS
  Administered 2015-03-04 – 2015-03-05 (×2): 2 [IU] via SUBCUTANEOUS
  Filled 2015-03-04: qty 2
  Filled 2015-03-04: qty 3
  Filled 2015-03-04: qty 1
  Filled 2015-03-04: qty 2

## 2015-03-04 MED ORDER — IPRATROPIUM-ALBUTEROL 0.5-2.5 (3) MG/3ML IN SOLN
3.0000 mL | Freq: Four times a day (QID) | RESPIRATORY_TRACT | Status: DC
  Administered 2015-03-05: 3 mL via RESPIRATORY_TRACT
  Filled 2015-03-04 (×2): qty 3

## 2015-03-04 NOTE — Progress Notes (Signed)
5 mg of Metoprolol given. Pts blood pressure 87/40. Will hold Morphine for now. Will continue to monitor.   Iran Sizer M

## 2015-03-04 NOTE — Progress Notes (Signed)
MD, [name] notified of pt's CBG of 303. Will continue to monitor.

## 2015-03-04 NOTE — Progress Notes (Signed)
Pt received 1L bolus. At the completion of bolus BP was 131/89. RN notified by telemetry clerk that pt heart rhythm is sinus with rate in the 90's. MD willis notified. No new orders given. Will continue to monitor.   Johnny Sanford

## 2015-03-04 NOTE — Progress Notes (Signed)
[facility] [place] NAME: Johnatan Lyndon    MR#:  [id number]  DATE OF BIRTH:  1940-07-24  SUBJECTIVE:  CHIEF COMPLAINT:   Chief Complaint  Patient presents with  . Shortness of Breath   - admitted from home due to worsening shortness of breath.  -Now on NRB mask, wheezing and coughing  REVIEW OF SYSTEMS:  Review of Systems  Constitutional: Positive for malaise/fatigue. Negative for fever and chills.  HENT: Negative for ear discharge, ear pain and nosebleeds.   Eyes: Negative for blurred vision.  Respiratory: Positive for cough, shortness of breath and wheezing.   Cardiovascular: Negative for chest pain, palpitations and leg swelling.  Gastrointestinal: Negative for nausea, vomiting, abdominal pain, diarrhea and constipation.  Genitourinary: Negative for dysuria.  Musculoskeletal: Negative for myalgias.  Neurological: Negative for dizziness, sensory change, speech change, focal weakness, seizures and headaches.  Psychiatric/Behavioral: Negative for depression.    DRUG ALLERGIES:   Allergies  Allergen Reactions  . Simvastatin Anxiety    VITALS:  Blood pressure 119/77, pulse 102, temperature 98.7 F (37.1 C), temperature source Oral, resp. rate 19, height 5\' 4"  (1.626 m), weight 64.592 kg (142 lb 6.4 oz), SpO2 98 %.  PHYSICAL EXAMINATION:  Physical Exam  GENERAL:  75 y.o.-year-old patient lying in the bed with no acute distress.  EYES: Pupils equal, round, reactive to light and accommodation. No scleral icterus. Extraocular muscles intact.  HEENT: Head atraumatic, normocephalic. Oropharynx and nasopharynx clear.  NECK:  Supple, no jugular venous distention. No thyroid enlargement, no tenderness.  LUNGS: Improved scattered wheezing more worse on left side today. No rales,rhonchi or crepitation. No use of accessory muscles of respiration.  CARDIOVASCULAR: S1, S2 normal. No murmurs, rubs, or gallops.  ABDOMEN: Soft,  nontender, nondistended. Bowel sounds present. No organomegaly or mass.  EXTREMITIES: No pedal edema, cyanosis, or clubbing.  NEUROLOGIC: Cranial nerves II through XII are intact. Muscle strength 5/5 in all extremities. Sensation intact. Gait not checked.  PSYCHIATRIC: The patient is alert and oriented x 3.  SKIN: No obvious rash, lesion, or ulcer.    LABORATORY PANEL:   CBC  Recent Labs Lab 03/04/15 0748  WBC 4.6  HGB 8.5*  HCT 25.6*  PLT 1065*   ------------------------------------------------------------------------------------------------------------------  Chemistries   Recent Labs Lab 03/02/15 1556  03/04/15 0748  NA 131*  < > 134*  K 4.2  < > 4.3  CL 90*  < > 91*  CO2 34*  < > 34*  GLUCOSE 140*  < > 148*  BUN 26*  < > 22*  CREATININE 0.55*  < > 0.51*  CALCIUM 8.4*  < > 8.4*  AST 19  --   --   ALT 21  --   --   ALKPHOS 52  --   --   BILITOT 0.5  --   --   < > = values in this interval not displayed. ------------------------------------------------------------------------------------------------------------------  Cardiac Enzymes  Recent Labs Lab 03/03/15 0842  TROPONINI 0.21*   ------------------------------------------------------------------------------------------------------------------  RADIOLOGY:  Dg Chest 2 View  03/02/2015  CLINICAL DATA:  75 year old male with possible aspiration. EXAM: CHEST  2 VIEW COMPARISON:  None. FINDINGS: Mild cardiomegaly again noted scratch a mild cardiomegaly and peribronchial thickening again noted. Mildly increased left lower lobe opacity may represent atelectasis, aspiration or pneumonia. There is no evidence of pleural effusion, pneumothorax, edema or acute bony abnormality. IMPRESSION: Mild left lower lobe opacity which may represent atelectasis, or pneumonia. Radiographic follow-up to  resolution is recommended. Electronically Signed   By: Margarette Canada M.D.   On: 03/02/2015 14:14   Dg Chest Port 1 View  03/02/2015   CLINICAL DATA:  Sepsis. EXAM: PORTABLE CHEST 1 VIEW COMPARISON:  Same day. FINDINGS: Stable cardiomegaly is noted. No pneumothorax is noted. Right lung is clear. Increased left basilar opacity is noted concerning for worsening pneumonia or atelectasis with possible associated pleural effusion. Bony thorax is unremarkable. IMPRESSION: Increased left basilar opacity concerning for worsening pneumonia or atelectasis with possible associated pleural effusion. Electronically Signed   By: Marijo Conception, M.D.   On: 03/02/2015 18:29    EKG:   Orders placed or performed during the hospital encounter of 03/02/15  . ED EKG 12-Lead  . ED EKG 12-Lead  . EKG 12-Lead  . 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  #1 Acute on chronic hypoxic respiratory failure- Improved wheezing now - f/u CXR today -continue IV Solu-Medrol, continued to on nebs. -Chest x-ray with new left-sided infiltrate possible pneumonia. F/u today - was on 100% NRB yesterday- down to 3L today -Continue Rocephin for healthcare acquired pneumonia- as blood cultures with serratia -Blood cultures with Serratia and Enterobacteriaceae-follow final cultures  #2. Adrenal insufficiency-continue florinef  #3 depression and anxiety-continue Lexapro and Klonopin  #4 hypothyroidism-continue Synthroid  #5 thrombocythemia-elevated platelet count. Continue hydroxyurea Appreciate oncology consult  #6 Dysphagia-recent admission in January 2016 and noted to have failed modified barium swallow study twice. -Has a PEG tube. Appreciate dietitian consult for bolus feeds  #7 hypertension-usually takes Florinef, however blood pressure  elevated now. Also on steroids. -Started on low-dose Norvasc and also IV hydralazine when necessary  #8 DVT prophylaxis-subcutaneous heparin   All the records are reviewed and case discussed with Care Management/Social Workerr. Management plans discussed with the patient, family and they are in agreement.  CODE STATUS: Full Code  TOTAL TIME TAKING CARE OF THIS PATIENT: 38  minutes.   POSSIBLE D/C IN 3 DAYS, DEPENDING ON CLINICAL CONDITION.   Starlette Thurow M.D on 03/04/2015 at 12:32 PM  Between 7am to 6pm - Pager - [phone]  After 6pm go to [email or web address] - password [email or web address] [facility]  Office  [phone]  CC: Primary care physician; Rica Mast, MD

## 2015-03-04 NOTE — Progress Notes (Signed)
Son called and updated about pts status.   Johnny Sanford

## 2015-03-04 NOTE — Progress Notes (Signed)
500 Bolus started to help pts blood pressure. BP currently fluctuating [id number] systolic.Pts heart rate 140's-150's. EKG obtained. Pt in Afib RVR. MD [name] notified. Order to continue to bolus to bring BP up, then we will start amio gtt. Bolus is currently infusing. Will continue to monitor.   Johnny Sanford

## 2015-03-04 NOTE — Progress Notes (Signed)
[facility]  Date of admission:  03/02/2015  Inpatient day:  03/04/2015  Consulting physician:  Demetrios Loll, MD   Reason for Consultation:  Cancer.  Chief Complaint: Johnny Sanford is a 75 y.o. male with a history of salivary gland tumor and essential thrombocytosis (ET) who was admitted with a left lower lobe pneumonia.  Subjective:  Patient notes general fatigue.  Still short of breath.  Non-productive cough.  No fever or chills.  Past Medical History  Diagnosis Date  . HLD (hyperlipidemia)   . HTN (hypertension)   . GERD (gastroesophageal reflux disease)   . Hypothyroidism   . Cancer ([place]) 1991    sqauamous cell ca  . CVA (cerebral vascular accident) ([place])   . DA (degenerative arthritis)   . RA (rheumatoid arthritis) ([place])   . Adrenal insufficiency ([place])   . Cervical spinal stenosis   . Falls   . Collagen vascular disease ([place])   . COPD (chronic obstructive pulmonary disease) ([facility])     mild    Past Surgical History  Procedure Laterality Date  . Carotid endarterectomy      left  . Skin lesion excision      Dr. Sarajane Jews, and Dr. Vickki Muff at [facility]  . Peg placement N/A 02/09/2015    Procedure: PERCUTANEOUS ENDOSCOPIC GASTROSTOMY (PEG) PLACEMENT;  Surgeon: Hulen Luster, MD;  Location: [facility] ENDOSCOPY;  Service: Endoscopy;  Laterality: N/A;    Family History  Problem Relation Age of Onset  . Heart disease Father     Social History:  reports that he quit smoking about 13 years ago. His smoking use included Cigarettes. He quit after 15 years of use. He has never used smokeless tobacco. He reports that he does not drink alcohol or use illicit drugs.  The patient is accompanied by his wife.  Allergies:  Allergies  Allergen Reactions  . Simvastatin Anxiety    Medications Prior to Admission  Medication Sig Dispense Refill  . aspirin 81 MG EC tablet 162 mg by Feeding Tube route daily.     . clonazePAM (KLONOPIN) 0.5 MG tablet 0.5 mg by Feeding Tube route 3  (three) times daily as needed for anxiety.    Marland Kitchen escitalopram (LEXAPRO) 20 MG tablet 20 mg by Feeding Tube route daily.     . fludrocortisone (FLORINEF) 0.1 MG tablet 0.1 mg by Feeding Tube route daily.    . fluorouracil (EFUDEX) 5 % cream Apply 1 application topically as needed (for skin spots).     . fluticasone (FLONASE) 50 MCG/ACT nasal spray Place 1 spray into both nostrils daily as needed for rhinitis. 16 g 6  . Fluticasone Furoate-Vilanterol (BREO ELLIPTA) 200-25 MCG/INH AEPB Inhale 200 mcg into the lungs daily. 1 each 6  . HYDROcodone-acetaminophen (NORCO/VICODIN) 5-325 MG tablet Place 1 tablet into feeding tube every 4 (four) hours as needed for moderate pain. 30 tablet 0  . hydroxyurea (HYDREA) 500 MG capsule 1,500 mg by Feeding Tube route at bedtime.    Marland Kitchen ipratropium-albuterol (DUONEB) 0.5-2.5 (3) MG/3ML SOLN Take 3 mLs by nebulization every 4 (four) hours as needed. (Patient taking differently: Take 3 mLs by nebulization every 4 (four) hours as needed (for wheezing/shortness of breath). ) 360 mL 6  . levothyroxine (SYNTHROID, LEVOTHROID) 137 MCG tablet 137 mcg by Feeding Tube route daily before breakfast.    . Nutritional Supplements (FEEDING SUPPLEMENT, JEVITY 1.5 CAL/FIBER,) LIQD Place 360 mLs into feeding tube 4 (four) times daily. 120 mL 6  . ranitidine (ZANTAC)  150 MG tablet 150 mg by Feeding Tube route 2 (two) times daily.      Review of Systems: GENERAL:  Fatigue.  No fevers, chills, sweats or weight loss. PERFORMANCE STATUS (ECOG):  2 HEENT:  No visual changes, runny nose, sore throat, mouth sores or tenderness. Lungs: Shortness of breath.  Cough.  Wheezing.  No hemoptysis. Cardiac:  No chest pain, palpitations, orthopnea, or PND. GI:  Unable to eat by mouth secondary to aspiration risk.  Feeding tube.  No nausea, vomiting, diarrhea, constipation, melena or hematochezia. GU:  No urgency, frequency, dysuria, or hematuria. Musculoskeletal:  No back pain.  No joint pain.  No  muscle tenderness. Extremities:  No pain or swelling. Skin:  No rashes or skin changes. Neuro:  No headache, numbness or weakness, balance or coordination issues. Endocrine:  No diabetes.  Thyroid issues on Synthroid.  No hot flashes or night sweats. Psych:  No mood changes, depression or anxiety. Pain:  No focal pain. Review of systems:  All other systems reviewed and found to be negative.  Physical Exam:  Blood pressure 119/77, pulse 102, temperature 98.7 F (37.1 C), temperature source Oral, resp. rate 19, height _0  (1.626 m), weight 142 lb 6.4 oz (64.592 kg), SpO2 98 %.  GENERAL:  Thin elderly gentleman sitting comfortably in a lounge chair on the medical unit in no acute distress. MENTAL STATUS:  Alert and oriented to person, place and time. HEAD:  Short blonde hair.  Normocephalic, atraumatic, face symmetric, no Cushingoid features. EYES:  Blue eyes.  Pupils equal round and reactive to light and accomodation.  No conjunctivitis or scleral icterus. ENT:  [place] in place.  Dry mouth.  Oropharynx clear without lesion.  Tongue normal. Mucous membranes moist.  RESPIRATORY:  Fine crackles left lower lobe.   No wheezes or rhonchi. CARDIOVASCULAR:  Regular rate and rhythm without murmur, rub or gallop. ABDOMEN:  PEG tube.  Soft, non-tender, with active bowel sounds, and no hepatosplenomegaly.  No masses. SKIN:  Ecchymosis on arms.  Chronic skin changes.  No rashes, ulcers or lesions. EXTREMITIES: Thin extremities.  Arthritis changes in hands.  No edema, no skin discoloration or tenderness.  No palpable cords. LYMPH NODES: No palpable cervical, supraclavicular, axillary or inguinal adenopathy  NEUROLOGICAL: Unremarkable. PSYCH:  Appropriate.  Results for orders placed or performed during the hospital encounter of 03/02/15 (from the past 48 hour(s))  Urinalysis complete, with microscopic ([facility] only)     Status: Abnormal   Collection Time: 03/02/15  3:18 PM  Result Value Ref Range   Color,  Urine YELLOW (A) YELLOW   APPearance HAZY (A) CLEAR   Glucose, UA NEGATIVE NEGATIVE mg/dL   Bilirubin Urine NEGATIVE NEGATIVE   Ketones, ur TRACE (A) NEGATIVE mg/dL   Specific Gravity, Urine 1.030 1.005 - 1.030   Hgb urine dipstick NEGATIVE NEGATIVE   pH 6.0 5.0 - 8.0   Protein, ur 100 (A) NEGATIVE mg/dL   Nitrite NEGATIVE NEGATIVE   Leukocytes, UA NEGATIVE NEGATIVE   RBC / HPF 0-5 0 - 5 RBC/hpf   WBC, UA 0-5 0 - 5 WBC/hpf   Bacteria, UA RARE (A) NONE SEEN   Squamous Epithelial / LPF NONE SEEN NONE SEEN   Mucous PRESENT   Urine culture     Status: None   Collection Time: 03/02/15  3:18 PM  Result Value Ref Range   Specimen Description URINE, RANDOM    Special Requests NONE    Culture MULTIPLE SPECIES PRESENT, SUGGEST RECOLLECTION  Report Status 03/04/2015 FINAL   Blood Culture (routine x 2)     Status: None (Preliminary result)   Collection Time: 03/02/15  3:45 PM  Result Value Ref Range   Specimen Description BLOOD RIGHT FOREARM    Special Requests BOTTLES DRAWN AEROBIC AND ANAEROBIC  3CC    Culture  Setup Time      GRAM NEGATIVE RODS IN BOTH AEROBIC AND ANAEROBIC BOTTLES IDENTIFIED AS SERRATIA MARCESCENS CRITICAL RESULT CALLED TO, READ BACK BY AND VERIFIED WITH: Roe Coombs 03/03/2015 0844 BY J RAZZAKSUAREZ,MT    Culture      SERRATIA MARCESCENS IN BOTH AEROBIC AND ANAEROBIC BOTTLES SUSCEPTIBILITIES TO FOLLOW    Report Status PENDING   Blood Culture ID Panel (Reflexed)     Status: Abnormal   Collection Time: 03/02/15  3:45 PM  Result Value Ref Range   Enterococcus species NOT DETECTED NOT DETECTED   Listeria monocytogenes NOT DETECTED NOT DETECTED   Staphylococcus species NOT DETECTED NOT DETECTED   Staphylococcus aureus NOT DETECTED NOT DETECTED   Streptococcus species NOT DETECTED NOT DETECTED   Streptococcus agalactiae NOT DETECTED NOT DETECTED   Streptococcus pneumoniae NOT DETECTED NOT DETECTED   Streptococcus pyogenes NOT DETECTED NOT DETECTED    Acinetobacter baumannii NOT DETECTED NOT DETECTED   Enterobacteriaceae species DETECTED (A) NOT DETECTED    Comment: CRITICAL RESULT CALLED TO, READ BACK BY AND VERIFIED WITH: Roe Coombs 03/03/2015 0844 BY J RAZZAKSUAREZ,MT    Enterobacter cloacae complex NOT DETECTED NOT DETECTED   Escherichia coli NOT DETECTED NOT DETECTED   Klebsiella oxytoca NOT DETECTED NOT DETECTED   Klebsiella pneumoniae NOT DETECTED NOT DETECTED   Proteus species NOT DETECTED NOT DETECTED   Serratia marcescens DETECTED (A) NOT DETECTED    Comment: CRITICAL RESULT CALLED TO, READ BACK BY AND VERIFIED WITH: Roe Coombs 03/03/2015 0844 BY J RAZZAKSUAREZ,MT    Haemophilus influenzae NOT DETECTED NOT DETECTED   Neisseria meningitidis NOT DETECTED NOT DETECTED   Pseudomonas aeruginosa NOT DETECTED NOT DETECTED   Candida albicans NOT DETECTED NOT DETECTED   Candida glabrata NOT DETECTED NOT DETECTED   Candida krusei NOT DETECTED NOT DETECTED   Candida parapsilosis NOT DETECTED NOT DETECTED   Candida tropicalis NOT DETECTED NOT DETECTED   Carbapenem resistance NOT DETECTED NOT DETECTED   Methicillin resistance NOT DETECTED NOT DETECTED   Vancomycin resistance NOT DETECTED NOT DETECTED  Blood Culture (routine x 2)     Status: None (Preliminary result)   Collection Time: 03/02/15  3:53 PM  Result Value Ref Range   Specimen Description BLOOD RIGHT ANTECUBITAL    Special Requests BOTTLES DRAWN AEROBIC AND ANAEROBIC  3CC    Culture  Setup Time      GRAM NEGATIVE RODS IN BOTH AEROBIC AND ANAEROBIC BOTTLES CRITICAL VALUE NOTED.  VALUE IS CONSISTENT WITH PREVIOUSLY REPORTED AND CALLED VALUE.    Culture      GRAM NEGATIVE RODS IN BOTH AEROBIC AND ANAEROBIC BOTTLES IDENTIFICATION AND SUSCEPTIBILITIES TO FOLLOW    Report Status PENDING   Lactic acid, plasma     Status: None   Collection Time: 03/02/15  3:54 PM  Result Value Ref Range   Lactic Acid, Venous 1.6 0.5 - 2.0 mmol/L  Comprehensive metabolic panel      Status: Abnormal   Collection Time: 03/02/15  3:56 PM  Result Value Ref Range   Sodium 131 (L) 135 - 145 mmol/L   Potassium 4.2 3.5 - 5.1 mmol/L   Chloride 90 (L) 101 - 111 mmol/L  CO2 34 (H) 22 - 32 mmol/L   Glucose, Bld 140 (H) 65 - 99 mg/dL   BUN 26 (H) 6 - 20 mg/dL   Creatinine, Ser 0.55 (L) 0.61 - 1.24 mg/dL   Calcium 8.4 (L) 8.9 - 10.3 mg/dL   Total Protein 7.1 6.5 - 8.1 g/dL   Albumin 3.1 (L) 3.5 - 5.0 g/dL   AST 19 15 - 41 U/L   ALT 21 17 - 63 U/L   Alkaline Phosphatase 52 38 - 126 U/L   Total Bilirubin 0.5 0.3 - 1.2 mg/dL   GFR calc non Af Amer >60 >60 mL/min   GFR calc Af Amer >60 >60 mL/min    Comment: (NOTE) The eGFR has been calculated using the CKD EPI equation. This calculation has not been validated in all clinical situations. eGFR's persistently <60 mL/min signify possible Chronic Kidney Disease.    Anion gap 7 5 - 15  Troponin I     Status: Abnormal   Collection Time: 03/02/15  3:56 PM  Result Value Ref Range   Troponin I 0.56 (H) <0.031 ng/mL    Comment: READ BACK AND VERIFIED WITH JERRIE THOMPSON AT 1636 03/02/2015 BY TFK        POSSIBLE MYOCARDIAL ISCHEMIA. SERIAL TESTING RECOMMENDED.   CBC WITH DIFFERENTIAL     Status: Abnormal   Collection Time: 03/02/15  3:56 PM  Result Value Ref Range   WBC 3.8 3.8 - 10.6 K/uL   RBC 1.99 (L) 4.40 - 5.90 MIL/uL   Hemoglobin 8.5 (L) 13.0 - 18.0 g/dL   HCT 25.5 (L) 40.0 - 52.0 %   MCV 128.0 (H) 80.0 - 100.0 fL   MCH 42.6 (H) 26.0 - 34.0 pg   MCHC 33.3 32.0 - 36.0 g/dL   RDW 15.2 (H) 11.5 - 14.5 %   Platelets 924 (HH) 150 - 440 K/uL    Comment: CRITICAL RESULT CALLED TO, READ BACK BY AND VERIFIED WITH:  JERRIE THOMPSON AT 1635 03/02/15 SDR    Neutrophils Relative % 69% %   Neutro Abs 2.7 1.4 - 6.5 K/uL   Lymphocytes Relative 5% %   Lymphs Abs 0.2 (L) 1.0 - 3.6 K/uL   Monocytes Relative 26% %   Monocytes Absolute 1.0 0.2 - 1.0 K/uL   Eosinophils Relative 0% %   Eosinophils Absolute 0.0 0 - 0.7 K/uL    Basophils Relative 0% %   Basophils Absolute 0.0 0 - 0.1 K/uL  Protime-INR     Status: Abnormal   Collection Time: 03/02/15  3:56 PM  Result Value Ref Range   Prothrombin Time 15.9 (H) 11.4 - 15.0 seconds   INR 1.26   Lactic acid, plasma     Status: None   Collection Time: 03/02/15  8:59 PM  Result Value Ref Range   Lactic Acid, Venous 1.5 0.5 - 2.0 mmol/L  Troponin I     Status: Abnormal   Collection Time: 03/02/15  8:59 PM  Result Value Ref Range   Troponin I 0.42 (H) <0.031 ng/mL    Comment: PREVIOUS RESULT CALLED BY TFK AT 1636 ON 03/02/15 RWW        PERSISTENTLY INCREASED TROPONIN VALUES IN THE RANGE OF 0.04-0.49 ng/mL CAN BE SEEN IN:       -UNSTABLE ANGINA       -CONGESTIVE HEART FAILURE       -MYOCARDITIS       -CHEST TRAUMA       -ARRYHTHMIAS       -  LATE PRESENTING MYOCARDIAL INFARCTION       -COPD   CLINICAL FOLLOW-UP RECOMMENDED.   Glucose, capillary     Status: Abnormal   Collection Time: 03/03/15  2:57 AM  Result Value Ref Range   Glucose-Capillary 146 (H) 65 - 99 mg/dL  Basic metabolic panel     Status: Abnormal   Collection Time: 03/03/15  3:16 AM  Result Value Ref Range   Sodium 132 (L) 135 - 145 mmol/L   Potassium 4.7 3.5 - 5.1 mmol/L   Chloride 92 (L) 101 - 111 mmol/L   CO2 34 (H) 22 - 32 mmol/L   Glucose, Bld 169 (H) 65 - 99 mg/dL   BUN 20 6 - 20 mg/dL   Creatinine, Ser 0.53 (L) 0.61 - 1.24 mg/dL   Calcium 8.4 (L) 8.9 - 10.3 mg/dL   GFR calc non Af Amer >60 >60 mL/min   GFR calc Af Amer >60 >60 mL/min    Comment: (NOTE) The eGFR has been calculated using the CKD EPI equation. This calculation has not been validated in all clinical situations. eGFR's persistently <60 mL/min signify possible Chronic Kidney Disease.    Anion gap 6 5 - 15  CBC     Status: Abnormal   Collection Time: 03/03/15  3:16 AM  Result Value Ref Range   WBC 3.5 (L) 3.8 - 10.6 K/uL   RBC 1.91 (L) 4.40 - 5.90 MIL/uL   Hemoglobin 8.2 (L) 13.0 - 18.0 g/dL   HCT 24.5 (L) 40.0 -  52.0 %   MCV 128.4 (H) 80.0 - 100.0 fL   MCH 43.0 (H) 26.0 - 34.0 pg   MCHC 33.4 32.0 - 36.0 g/dL   RDW 15.4 (H) 11.5 - 14.5 %   Platelets 864 (H) 150 - 440 K/uL  Troponin I     Status: Abnormal   Collection Time: 03/03/15  3:16 AM  Result Value Ref Range   Troponin I 0.27 (H) <0.031 ng/mL    Comment: PREVIOUS RESULT CALLED BY TFK AT 1636 ON 03/02/15 RWW        PERSISTENTLY INCREASED TROPONIN VALUES IN THE RANGE OF 0.04-0.49 ng/mL CAN BE SEEN IN:       -UNSTABLE ANGINA       -CONGESTIVE HEART FAILURE       -MYOCARDITIS       -CHEST TRAUMA       -ARRYHTHMIAS       -LATE PRESENTING MYOCARDIAL INFARCTION       -COPD   CLINICAL FOLLOW-UP RECOMMENDED.   Troponin I     Status: Abnormal   Collection Time: 03/03/15  8:42 AM  Result Value Ref Range   Troponin I 0.21 (H) <0.031 ng/mL    Comment: PREVIOUS RESULT CALLED AT 1636 ON  03/02/15 BY TFK.Marland KitchenMarland KitchenSDR        PERSISTENTLY INCREASED TROPONIN VALUES IN THE RANGE OF 0.04-0.49 ng/mL CAN BE SEEN IN:       -UNSTABLE ANGINA       -CONGESTIVE HEART FAILURE       -MYOCARDITIS       -CHEST TRAUMA       -ARRYHTHMIAS       -LATE PRESENTING MYOCARDIAL INFARCTION       -COPD   CLINICAL FOLLOW-UP RECOMMENDED.   Glucose, capillary     Status: Abnormal   Collection Time: 03/03/15  4:21 PM  Result Value Ref Range   Glucose-Capillary 225 (H) 65 - 99 mg/dL   Comment 1 Notify RN  Glucose, capillary     Status: Abnormal   Collection Time: 03/03/15  8:22 PM  Result Value Ref Range   Glucose-Capillary 204 (H) 65 - 99 mg/dL  Glucose, capillary     Status: Abnormal   Collection Time: 03/04/15 12:04 AM  Result Value Ref Range   Glucose-Capillary 303 (H) 65 - 99 mg/dL  Glucose, capillary     Status: Abnormal   Collection Time: 03/04/15  2:15 AM  Result Value Ref Range   Glucose-Capillary 171 (H) 65 - 99 mg/dL  Glucose, capillary     Status: Abnormal   Collection Time: 03/04/15  4:00 AM  Result Value Ref Range   Glucose-Capillary 135 (H) 65 - 99  mg/dL  Glucose, capillary     Status: Abnormal   Collection Time: 03/04/15  7:20 AM  Result Value Ref Range   Glucose-Capillary 134 (H) 65 - 99 mg/dL   Comment 1 Notify RN    Comment 2 Document in Chart   CBC     Status: Abnormal   Collection Time: 03/04/15  7:48 AM  Result Value Ref Range   WBC 4.6 3.8 - 10.6 K/uL   RBC 2.02 (L) 4.40 - 5.90 MIL/uL   Hemoglobin 8.5 (L) 13.0 - 18.0 g/dL   HCT 25.6 (L) 40.0 - 52.0 %   MCV 127.2 (H) 80.0 - 100.0 fL   MCH 42.2 (H) 26.0 - 34.0 pg   MCHC 33.2 32.0 - 36.0 g/dL   RDW 15.4 (H) 11.5 - 14.5 %   Platelets 1065 (HH) 150 - 440 K/uL    Comment: RESULT REPEATED AND VERIFIED CRITICAL RESULT CALLED TO, READ BACK BY AND VERIFIED WITH: CHERYL GREEN ON 03/04/15 AT 0826 BY QSD   Basic metabolic panel     Status: Abnormal   Collection Time: 03/04/15  7:48 AM  Result Value Ref Range   Sodium 134 (L) 135 - 145 mmol/L   Potassium 4.3 3.5 - 5.1 mmol/L   Chloride 91 (L) 101 - 111 mmol/L   CO2 34 (H) 22 - 32 mmol/L   Glucose, Bld 148 (H) 65 - 99 mg/dL   BUN 22 (H) 6 - 20 mg/dL   Creatinine, Ser 0.51 (L) 0.61 - 1.24 mg/dL   Calcium 8.4 (L) 8.9 - 10.3 mg/dL   GFR calc non Af Amer >60 >60 mL/min   GFR calc Af Amer >60 >60 mL/min    Comment: (NOTE) The eGFR has been calculated using the CKD EPI equation. This calculation has not been validated in all clinical situations. eGFR's persistently <60 mL/min signify possible Chronic Kidney Disease.    Anion gap 9 5 - 15  Type and screen [facility]     Status: None   Collection Time: 03/04/15  7:48 AM  Result Value Ref Range   ABO/RH(D) A POS    Antibody Screen NEG    Sample Expiration 03/07/2015   Glucose, capillary     Status: Abnormal   Collection Time: 03/04/15 11:04 AM  Result Value Ref Range   Glucose-Capillary 204 (H) 65 - 99 mg/dL   Comment 1 Notify RN    Comment 2 Document in Chart    Dg Chest Port 1 View  03/02/2015  CLINICAL DATA:  Sepsis. EXAM: PORTABLE CHEST 1 VIEW  COMPARISON:  Same day. FINDINGS: Stable cardiomegaly is noted. No pneumothorax is noted. Right lung is clear. Increased left basilar opacity is noted concerning for worsening pneumonia or atelectasis with possible associated pleural effusion. Bony thorax  is unremarkable. IMPRESSION: Increased left basilar opacity concerning for worsening pneumonia or atelectasis with possible associated pleural effusion. Electronically Signed   By: Marijo Conception, M.D.   On: 03/02/2015 18:29    Assessment:  The patient is a 75 y.o. 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  He was admitted with a left lower lobe pneumonia.  Blood cultures are positive for Serratia marcescens.  He has a history of aspiration and takes nothing by mouth.  Platelet count on admission was 924,000, decreased to 864,000, and is 1.065 million today.  Etiology likely secondary to acute phase reactant, possible iron deficiency, and patient's ET (off hydroxyurea x 1 week recently).  Suspect platelet count will improve with management of infection.  ANC adequate (3800) yesterday.  Plan:   1.  Hematology/Oncology:  ET on Hydrea 1500 mg a day.  Platelet count up today.  Suspect due to pneumonia and bacteremia (acute phase reactant), ET (off Hydea at least a week prior to admission), and possible iron deficiency.  Will assess for iron deficiency.  Will also check B12, folate, and TSH (checked yearly to ensure macrocytosis caused by Hydrea does not mask a deficiency).  B12 was 903 on 12/04/2014.  TSH was 1.32 (normal) on 12/04/2014.  Hematocrit up slightly, thus no need for transfusion.  Continue hydroxyurea 1500 mg a day (at night through feeding tube).  Continue aspirin daily.   2.  Infectious disease:  Left lower lobe pneumonia and Serratia marcescens bacteremia.  Blood culture ID panel also notes Enterobacteriaceae species.  Susceptibilities pending.   Patient on ceftriaxone.  Consider ID consult. 3.  Adrenal insufficiency:  On Florinef.  On solumedrol. 4.  Fluids/electrolytes/nutrition:  On tube feeds secondary to aspiration event (failed modified barium swallow x 2).  Thank you for allowing me to participate in [place] 's care.  I will follow him closely with you while hospitalized.  He will be seen this week by Dr. Grayland Ormond, his oncologist.  Lequita Asal, MD  03/04/2015, 2:26 PM

## 2015-03-04 NOTE — Progress Notes (Signed)
Patient ambulated around nurses' station x 2, 200 feet, with front wheel walker and 2 liters O2 [place].  No s/sx of [place], HR elevated 140-150 after ambulation.  O2 sats 92%

## 2015-03-04 NOTE — Progress Notes (Signed)
RN notified by telemetry clerk that pts heart was in the 180's. RN went to check on the pt, pt was sleeping in the bed. Pt was sleepy but arousable. Pt was drenched in sweat. Pt denies any reports of palpitations, or other symptoms. MD Sparks notified. Order given for IV Metoprolol 5 mg, and 2 mg of Morphine. Will continue to monitor.   Johnny Sanford

## 2015-03-05 ENCOUNTER — Inpatient Hospital Stay: Payer: Medicare Other

## 2015-03-05 ENCOUNTER — Inpatient Hospital Stay: Admit: 2015-03-05 | Payer: Medicare Other

## 2015-03-05 DIAGNOSIS — J189 Pneumonia, unspecified organism: Secondary | ICD-10-CM

## 2015-03-05 DIAGNOSIS — J9601 Acute respiratory failure with hypoxia: Secondary | ICD-10-CM

## 2015-03-05 DIAGNOSIS — I469 Cardiac arrest, cause unspecified: Secondary | ICD-10-CM

## 2015-03-05 LAB — TRIGLYCERIDES: Triglycerides: 81 mg/dL (ref <150)

## 2015-03-05 LAB — GLUCOSE, CAPILLARY
GLUCOSE-CAPILLARY: 132 mg/dL — AB (ref 65–99)
GLUCOSE-CAPILLARY: 148 mg/dL — AB (ref 65–99)
Glucose-Capillary: 178 mg/dL — ABNORMAL HIGH (ref 65–99)
Glucose-Capillary: 124 mg/dL — ABNORMAL HIGH (ref 65–99)
Glucose-Capillary: 131 mg/dL — ABNORMAL HIGH (ref 65–99)
Glucose-Capillary: 118 mg/dL — ABNORMAL HIGH (ref 65–99)

## 2015-03-05 LAB — CULTURE, BLOOD (ROUTINE X 2)

## 2015-03-05 LAB — BLOOD GAS, ARTERIAL
ACID-BASE EXCESS: 13.1 mmol/L — AB (ref 0.0–3.0)
Bicarbonate: 38.4 mEq/L — ABNORMAL HIGH (ref 21.0–28.0)
FIO2: 50
LHR: 14 {breaths}/min
MECHVT: 500 mL
O2 Saturation: 89.4 %
PATIENT TEMPERATURE: 37
PEEP/CPAP: 5 cmH2O
PH ART: 7.46 — AB (ref 7.350–7.450)
PO2 ART: 54 mmHg — AB (ref 83.0–108.0)
pCO2 arterial: 54 mmHg — ABNORMAL HIGH (ref 32.0–48.0)

## 2015-03-05 LAB — CBC WITH DIFFERENTIAL/PLATELET
Basophils Absolute: 0 10*3/uL (ref 0–0.1)
Basophils Relative: 0 %
Eosinophils Absolute: 0 10*3/uL (ref 0–0.7)
Eosinophils Relative: 0 %
HCT: 22.4 % — ABNORMAL LOW (ref 40.0–52.0)
Hemoglobin: 7.7 g/dL — ABNORMAL LOW (ref 13.0–18.0)
Lymphocytes Relative: 3 %
Lymphs Abs: 0.2 10*3/uL — ABNORMAL LOW (ref 1.0–3.6)
MCH: 44.1 pg — ABNORMAL HIGH (ref 26.0–34.0)
MCHC: 34.3 g/dL (ref 32.0–36.0)
MCV: 128.4 fL — ABNORMAL HIGH (ref 80.0–100.0)
Monocytes Absolute: 0.9 10*3/uL (ref 0.2–1.0)
Monocytes Relative: 13 %
Neutro Abs: 5.9 10*3/uL (ref 1.4–6.5)
Neutrophils Relative %: 84 %
Platelets: 1137 10*3/uL (ref 150–440)
RBC: 1.74 MIL/uL — ABNORMAL LOW (ref 4.40–5.90)
RDW: 15.8 % — ABNORMAL HIGH (ref 11.5–14.5)
WBC: 7 10*3/uL (ref 3.8–10.6)

## 2015-03-05 LAB — IRON AND TIBC
Iron: 47 ug/dL (ref 45–182)
Saturation Ratios: 28 % (ref 17.9–39.5)
TIBC: 171 ug/dL — ABNORMAL LOW (ref 250–450)
UIBC: 124 ug/dL

## 2015-03-05 LAB — FOLATE: Folate: 11.6 ng/mL (ref >5.9)

## 2015-03-05 LAB — SEDIMENTATION RATE: Sed Rate: 126 mm/hr — ABNORMAL HIGH (ref 0–20)

## 2015-03-05 LAB — MRSA PCR SCREENING: MRSA by PCR: NEGATIVE

## 2015-03-05 LAB — FERRITIN: Ferritin: 992 ng/mL — ABNORMAL HIGH (ref 24–336)

## 2015-03-05 MED ORDER — PANTOPRAZOLE SODIUM 40 MG IV SOLR
40.0000 mg | Freq: Every day | INTRAVENOUS | Status: DC
  Administered 2015-03-05 – 2015-03-06 (×2): 40 mg via INTRAVENOUS
  Filled 2015-03-05 (×2): qty 40

## 2015-03-05 MED ORDER — CHLORHEXIDINE GLUCONATE 0.12% ORAL RINSE (MEDLINE KIT)
15.0000 mL | Freq: Two times a day (BID) | OROMUCOSAL | Status: DC
  Administered 2015-03-05 – 2015-03-06 (×3): 15 mL via OROMUCOSAL
  Filled 2015-03-05 (×7): qty 15

## 2015-03-05 MED ORDER — HALOPERIDOL LACTATE 5 MG/ML IJ SOLN
0.5000 mg | INTRAMUSCULAR | Status: DC | PRN
  Filled 2015-03-05: qty 1

## 2015-03-05 MED ORDER — FREE WATER
120.0000 mL | Status: DC
  Administered 2015-03-06: 120 mL

## 2015-03-05 MED ORDER — DILTIAZEM HCL 30 MG PO TABS
30.0000 mg | ORAL_TABLET | Freq: Three times a day (TID) | ORAL | Status: DC
  Administered 2015-03-05 – 2015-03-06 (×4): 30 mg via ORAL
  Filled 2015-03-05 (×4): qty 1

## 2015-03-05 MED ORDER — MORPHINE 100MG IN NS 100ML (1MG/ML) PREMIX INFUSION
1.0000 mg/h | INTRAVENOUS | Status: DC
  Administered 2015-03-05 – 2015-03-06 (×2): 2 mg/h via INTRAVENOUS
  Filled 2015-03-05 (×2): qty 100

## 2015-03-05 MED ORDER — DEXTROSE 5 % IV SOLN
2.0000 g | Freq: Once | INTRAVENOUS | Status: DC
  Filled 2015-03-05: qty 2

## 2015-03-05 MED ORDER — GLYCOPYRROLATE 0.2 MG/ML IJ SOLN
0.2000 mg | INTRAMUSCULAR | Status: DC | PRN
  Administered 2015-03-06: 0.2 mg via INTRAVENOUS
  Filled 2015-03-05 (×3): qty 1

## 2015-03-05 MED ORDER — ANTISEPTIC ORAL RINSE SOLUTION (CORINZ)
7.0000 mL | OROMUCOSAL | Status: DC
  Administered 2015-03-05 – 2015-03-07 (×18): 7 mL via OROMUCOSAL
  Filled 2015-03-05 (×30): qty 7

## 2015-03-05 MED ORDER — IPRATROPIUM-ALBUTEROL 0.5-2.5 (3) MG/3ML IN SOLN
3.0000 mL | Freq: Four times a day (QID) | RESPIRATORY_TRACT | Status: DC
  Filled 2015-03-05: qty 3

## 2015-03-05 MED ORDER — CEFTRIAXONE SODIUM 2 G IJ SOLR
2.0000 g | INTRAMUSCULAR | Status: DC
Start: 2015-03-05 — End: 2015-03-06
  Administered 2015-03-05: 2 g via INTRAVENOUS
  Filled 2015-03-05 (×2): qty 2

## 2015-03-05 MED ORDER — HYDROXYUREA 100 MG/ML ORAL SUSPENSION
1500.0000 mg | Freq: Every day | ORAL | Status: DC
  Administered 2015-03-05: 1500 mg
  Filled 2015-03-05 (×4): qty 15

## 2015-03-05 MED ORDER — PROPOFOL 1000 MG/100ML IV EMUL: 5.0000 ug/kg/min | INTRAVENOUS | Status: DC

## 2015-03-05 MED ORDER — ALBUTEROL SULFATE (2.5 MG/3ML) 0.083% IN NEBU: 2.5000 mg | INHALATION_SOLUTION | RESPIRATORY_TRACT | Status: DC | PRN

## 2015-03-05 MED ORDER — HALOPERIDOL LACTATE 2 MG/ML PO CONC
0.5000 mg | ORAL | Status: DC | PRN
  Filled 2015-03-05: qty 0.3

## 2015-03-05 MED ORDER — JEVITY 1.5 CAL/FIBER PO LIQD
360.0000 mL | Freq: Four times a day (QID) | ORAL | Status: DC
  Administered 2015-03-05 – 2015-03-06 (×4): 360 mL
  Administered 2015-03-06: 50 mL

## 2015-03-05 MED ORDER — BIOTENE DRY MOUTH MT LIQD: 15.0000 mL | OROMUCOSAL | Status: DC | PRN

## 2015-03-05 MED ORDER — PROPOFOL 1000 MG/100ML IV EMUL
0.0000 ug/kg/min | INTRAVENOUS | Status: DC
  Administered 2015-03-05: 20 ug/kg/min via INTRAVENOUS

## 2015-03-05 MED ORDER — POLYVINYL ALCOHOL 1.4 % OP SOLN: 1.0000 [drp] | Freq: Four times a day (QID) | OPHTHALMIC | Status: DC | PRN

## 2015-03-05 MED ORDER — GLYCOPYRROLATE 1 MG PO TABS
1.0000 mg | ORAL_TABLET | ORAL | Status: DC | PRN
  Filled 2015-03-05: qty 1

## 2015-03-05 MED ORDER — MORPHINE BOLUS VIA INFUSION
1.0000 mg | INTRAVENOUS | Status: DC | PRN
  Administered 2015-03-05 (×2): 2 mg via INTRAVENOUS
  Filled 2015-03-05 (×3): qty 4

## 2015-03-05 MED ORDER — PROPOFOL 1000 MG/100ML IV EMUL
INTRAVENOUS | Status: AC
  Administered 2015-03-05: 20 ug/kg/min via INTRAVENOUS
  Filled 2015-03-05: qty 100

## 2015-03-05 MED ORDER — HALOPERIDOL 0.5 MG PO TABS
0.5000 mg | ORAL_TABLET | ORAL | Status: DC | PRN
  Filled 2015-03-05: qty 1

## 2015-03-05 MED ORDER — BISACODYL 10 MG RE SUPP: 10.0000 mg | Freq: Every day | RECTAL | Status: DC | PRN

## 2015-03-05 MED ORDER — GLYCOPYRROLATE 0.2 MG/ML IJ SOLN
0.2000 mg | INTRAMUSCULAR | Status: DC | PRN
  Filled 2015-03-05: qty 1

## 2015-03-05 MED ORDER — DEXTROSE 5 % IV SOLN: 2.0000 g | INTRAVENOUS | Status: DC

## 2015-03-05 MED FILL — Medication: Qty: 1 | Status: AC

## 2015-03-05 NOTE — Progress Notes (Signed)
Patient: Johnny Sanford / Admit Date: 03/02/2015 / Date of Encounter: 03/05/2015, 7:50 AM   Subjective: Transferred to ICU this morning after a Code Blue was called for respiratory failure requiring intubation. Large mucus plug was noted during intubation. Patient developed new onset Afib with RVR overnight with HR into the 170s coupled with systolic BP into the [id number]. He received IV metoprolol 5 mg x 1 with conversion to sinus rhythm. He currently remains in sinus rhythm with PACs with HR in the 70's, on Propofol.   Review of Systems: Review of Systems  Unable to perform ROS: intubated    Objective: Telemetry: Episodes of Afib with RVR on 2/5 around 10:30 PM, subsequently converting to sinus rhythm with frequent PACs Physical Exam: Blood pressure 133/94, pulse 88, temperature 97.6 F (36.4 C), temperature source Axillary, resp. rate 19, height 5\' 4"  (1.626 m), weight 142 lb 6.7 oz (64.6 kg), SpO2 100 %. Body mass index is 24.43 kg/(m^2). General: Critically ill appearing. Head: Normocephalic, atraumatic, sclera non-icteric, no xanthomas, nares are without discharge. Neck: Negative for carotid bruits. JVP not elevated. Lungs: Decreased and coarse breath sounds biaterally. Heart: RRR S1 S2 without murmurs, rubs, or gallops.  Abdomen: Soft, non-tender, non-distended with normoactive bowel sounds. No rebound/guarding. Extremities: No clubbing or cyanosis. No edema. Distal pedal pulses are 2+ and equal bilaterally. Neuro:Intubated and sedated. Psych:  Intubated and sedated.   Intake/Output Summary (Last 24 hours) at 03/05/15 0750 Last data filed at 03/05/15 0044  Gross per 24 hour  Intake   1440 ml  Output    550 ml  Net    890 ml    Inpatient Medications:  . amLODipine  2.5 mg Oral Daily  . antiseptic oral rinse  7 mL Mouth Rinse 10 times per day  . aspirin  324 mg Per Tube Once  . aspirin EC  81 mg Oral Daily  . cefTRIAXone (ROCEPHIN)  IV  2 g Intravenous Q24H  . chlorhexidine  gluconate  15 mL Mouth Rinse BID  . escitalopram  20 mg Per Tube Daily  . famotidine  20 mg Per Tube Daily  . feeding supplement (JEVITY 1.5 CAL/FIBER)  360 mL Per Tube QID  . fludrocortisone  0.05 mg Per Tube Daily  . free water  120 mL Per Tube Q24H  . heparin  5,000 Units Subcutaneous 3 times per day  . hydroxyurea  1,500 mg Per Tube QHS  . insulin aspart  0-5 Units Subcutaneous QHS  . insulin aspart  0-9 Units Subcutaneous TID WC  . ipratropium-albuterol  3 mL Nebulization Q6H  . levothyroxine  137 mcg Per Tube QAC breakfast  . methylPREDNISolone (SOLU-MEDROL) injection  60 mg Intravenous Q12H  . mometasone-formoterol  2 puff Inhalation BID  .  morphine injection  2 mg Intravenous Once  . pantoprazole (PROTONIX) IV  40 mg Intravenous Daily  . sodium chloride flush  3 mL Intravenous Q12H   Infusions:  . propofol (DIPRIVAN) infusion 20 mcg/kg/min (03/05/15 0620)    Labs:  Recent Labs  03/03/15 0316 03/04/15 0748  NA 132* 134*  K 4.7 4.3  CL 92* 91*  CO2 34* 34*  GLUCOSE 169* 148*  BUN 20 22*  CREATININE 0.53* 0.51*  CALCIUM 8.4* 8.4*    Recent Labs  03/02/15 1556  AST 19  ALT 21  ALKPHOS 52  BILITOT 0.5  PROT 7.1  ALBUMIN 3.1*    Recent Labs  03/02/15 1556  03/04/15 0748 03/05/15 0638  WBC  3.8  < > 4.6 7.0  NEUTROABS 2.7  --   --  PENDING  HGB 8.5*  < > 8.5* 7.7*  HCT 25.5*  < > 25.6* 22.4*  MCV 128.0*  < > 127.2* 128.4*  PLT 924*  < > 1065* 1137*  < > = values in this interval not displayed.  Recent Labs  03/02/15 1556 03/02/15 2059 03/03/15 0316 03/03/15 0842  TROPONINI 0.56* 0.42* 0.27* 0.21*   Invalid input(s): POCBNP No results for input(s): HGBA1C in the last 72 hours.   Weights: Filed Weights   03/03/15 0500 03/04/15 0402 03/05/15 0730  Weight: 143 lb 8 oz (65.091 kg) 142 lb 6.4 oz (64.592 kg) 142 lb 6.7 oz (64.6 kg)     Radiology/Studies:  Dg Chest 2 View  03/04/2015  CLINICAL DATA:  Hospitalized since yesterday, short of  breath EXAM: CHEST  2 VIEW COMPARISON:  Radiograph 03/02/2015 FINDINGS: Normal cardiac silhouette. There is LEFT lower lobe opacity. There is chronic elevation of RIGHT hemidiaphragm. There is scattered fine airspace disease similar to comparison exam. IMPRESSION: 1. No significant change. 2. LEFT lower lobe atelectasis versus infiltrate 3. Perihilar fine airspace opacity representing edema or infection. Electronically Signed   By: Suzy Bouchard M.D.   On: 03/04/2015 15:23   Dg Chest 2 View  03/02/2015  CLINICAL DATA:  74 year old male with possible aspiration. EXAM: CHEST  2 VIEW COMPARISON:  None. FINDINGS: Mild cardiomegaly again noted scratch a mild cardiomegaly and peribronchial thickening again noted. Mildly increased left lower lobe opacity may represent atelectasis, aspiration or pneumonia. There is no evidence of pleural effusion, pneumothorax, edema or acute bony abnormality. IMPRESSION: Mild left lower lobe opacity which may represent atelectasis, or pneumonia. Radiographic follow-up to resolution is recommended. Electronically Signed   By: Margarette Canada M.D.   On: 03/02/2015 14:14   Dg Abd 1 View  02/06/2015  CLINICAL DATA:  Assess NG tube placement ; impaired NG tube feeding EXAM: ABDOMEN - 1 VIEW COMPARISON:  Chest and abdominal film of February 05, 2015 FINDINGS: The radiodense tip of the feeding to lies in the region of the gastric body laterally. The stomach does not appear distended with gas. There is contrast in the ascending, transverse, and proximal descending colon with no significant small bowel contrast demonstrated. IMPRESSION: The feeding tube tip lies in the proximal aspect of the gastric body. Advancement of the tube for the distal stomach would be useful. This may occur spontaneously with additional tube slack being provided and turning the patient onto his right side. Electronically Signed   By: David  Martinique M.D.   On: 02/06/2015 08:00   Dg Abd 1 View  02/05/2015  CLINICAL DATA:   Encounter for NG tube placement EXAM: ABDOMEN - 1 VIEW COMPARISON:  None. FINDINGS: NG tube is in place with the tip in the mid stomach. Nonobstructive bowel gas pattern. IMPRESSION: NG tube tip in the mid stomach. Electronically Signed   By: Rolm Baptise M.D.   On: 02/05/2015 15:38   Ct Soft Tissue Neck W Contrast  02/07/2015  CLINICAL DATA:  History of aspiration and SIADH. EXAM: CT NECK WITH CONTRAST TECHNIQUE: Multidetector CT imaging of the neck was performed using the standard protocol following the bolus administration of intravenous contrast. CONTRAST:  75 mL OMNIPAQUE IOHEXOL 300 MG/ML  SOLN COMPARISON:  Chest CT scan 02/03/2015. FINDINGS: Pharynx and larynx: Unremarkable. Salivary glands: Unremarkable. Thyroid: Unremarkable. Lymph nodes: None. Vascular: The patient has dense carotid atherosclerotic vascular disease. Scattered atherosclerotic calcifications  of the vertebral arteries also identified. Limited intracranial: No focal abnormality is seen. Visualized orbits: Unremarkable. Mastoids and visualized paranasal sinuses: Clear. Skeleton: No lytic or sclerotic bony lesion is seen. The patient has multilevel cervical spondylosis. Upper chest: Mild biapical scar is seen as on the prior chest CT. There is a new small focus of ground-glass attenuation the refer the right upper lobe. IMPRESSION: No acute abnormality. Extensive atherosclerotic vascular disease. Small focus of ground-glass attenuation the periphery of the right upper lobe is new since the comparison chest CT and may represent focal inflammatory change. Extensive atherosclerosis. Electronically Signed   By: Inge Rise M.D.   On: 02/07/2015 14:07   Ct Angio Chest Pe W/cm &/or Wo Cm  02/03/2015  CLINICAL DATA:  Hypotension.  History of SIADH.  Hypoxia. EXAM: CT ANGIOGRAPHY CHEST WITH CONTRAST TECHNIQUE: Multidetector CT imaging of the chest was performed using the standard protocol during bolus administration of intravenous contrast.  Multiplanar CT image reconstructions and MIPs were obtained to evaluate the vascular anatomy. CONTRAST:  48mL OMNIPAQUE IOHEXOL 350 MG/ML SOLN COMPARISON:  Chest CT dated 04/12/2014. Also chest CT dated 06/07/2013 and 05/24/2012. FINDINGS: Mediastinum/Lymph Nodes: Thoracic aorta is normal in caliber and configuration, with scattered atherosclerotic changes. No aortic aneurysm or dissection. No pulmonary embolism identified within the main, lobar or segmental pulmonary artery branches bilaterally. Heart size is upper normal, stable. No pericardial effusion. Coronary artery calcifications noted. Several small lymph nodes noted within the mediastinum. No mass or enlarged lymph nodes seen within the mediastinum or perihilar regions. Lungs/Pleura: Biapical scarring/fibrosis appears stable compared to previous exams. New small nodular consolidations, some with ground-glass density, are appreciated within the right upper lobe, right lower lobe, lingula and left lower lobe. Largest on the right is seen within the inferior segment of the right upper lobe (series 6, image 55) measuring 12 x 7 mm. Largest on the left is within the medial segment of the left lower lobe measuring 13 x 9 mm (series 6, image 75). These are nonspecific and too small to definitively characterize. 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 No pleural effusion. No pneumothorax. Small emphysematous blebs again noted within each lung apex. Upper abdomen: No acute findings. Stomach moderately distended, incompletely imaged. Musculoskeletal: Mild degenerative change within the thoracic spine but no acute osseous abnormality. Review of the MIP images confirms the above findings. IMPRESSION: 1. New small nodular consolidations, some with ground-glass density, scattered throughout the right upper lobe, right lower lobe, lingula and left lower lobe. Measurements for the largest given above. These are nonspecific in appearance and too small to definitively  characterize. Differential includes atypical pneumonias such as viral or fungal, interstitial pneumonias, edema related to mild volume overload/CHF, chronic interstitial diseases, hypersensitivity pneumonitis, and respiratory bronchiolitis. Neoplastic process less likely. Recommend follow-up chest CT in 3-6 months to ensure resolution. 2. No aortic aneurysm or dissection. 3. No pulmonary embolism seen. 4. Heart size is upper normal. No pericardial effusion. Coronary artery calcifications, particularly dense within the left main and left anterior descending coronary arteries. Recommend correlation with any possible associated cardiac symptoms. Electronically Signed   By: Franki Cabot M.D.   On: 02/03/2015 13:59   Dg Esophagus  02/05/2015  CLINICAL DATA:  Six-month history of dysphagia especially to solids, cough, weight loss. EXAM: ESOPHOGRAM/BARIUM SWALLOW TECHNIQUE: Single contrast examination was performed using thin barium. A barium tablet was administered. FLUOROSCOPY TIME:  Fluoroscopy Time:  1 minutes, 12 seconds. Number of Acquired Images:  10 COMPARISON:  Chest x-ray of February 05, 2015 FINDINGS: Upon the initial ingestion of thick barium there was prompt laryngeal penetration with extension of barium into the trachea and the bronchi. This elicited a mild cough. Some barium did enter the esophagus allowing visualization of the structure. The cervical esophagus appeared to distend well. No significant mass effect upon the cervical esophagus was observed. The thoracic esophagus distended well. Under fluoroscopic observation the lower cervical esophagus and appeared to distend reasonably well. No mucosal irregularity was observed. An attempt was made to have the patient ingest a barium tablet. However, he was not able to propel the tablet into the posterior oropharynx with the tongue and had to spit out the tablet. IMPRESSION: 1. This is a quite limited study due to a large amount of aspiration of the barium  preparation. A double-contrast study had been planned but only a very limited single contrast study could be performed following the large amount of aspiration. The epiglottic function appears disordered and the motions of the tongue propelling the tablet as well as the liquid barium into the posterior oropharynx is disordered. 2. No mass-effect upon the proximal esophagus by the cervical spine is observed. 3. The thoracic esophagus is grossly normal for age with only mild changes of presbyesophagus. Electronically Signed   By: David  Martinique M.D.   On: 02/05/2015 10:47   Portable Chest Xray  03/05/2015  CLINICAL DATA:  Hypoxia EXAM: PORTABLE CHEST 1 VIEW COMPARISON:  March 04, 2015 FINDINGS: Endotracheal tube tip is 2.4 cm above the carina. There is no demonstrable pneumothorax. There is airspace consolidation in the left base region. There is mild scarring in each upper lobe near the apices. Lungs elsewhere clear. Heart is slightly enlarged with pulmonary vascularity within normal limits. No adenopathy. There is atherosclerotic calcification in the aorta. IMPRESSION: Endotracheal tube as described without pneumothorax. Left base consolidation. Electronically Signed   By: Lowella Grip III M.D.   On: 03/05/2015 07:08   Dg Chest Port 1 View  03/02/2015  CLINICAL DATA:  Sepsis. EXAM: PORTABLE CHEST 1 VIEW COMPARISON:  Same day. FINDINGS: Stable cardiomegaly is noted. No pneumothorax is noted. Right lung is clear. Increased left basilar opacity is noted concerning for worsening pneumonia or atelectasis with possible associated pleural effusion. Bony thorax is unremarkable. IMPRESSION: Increased left basilar opacity concerning for worsening pneumonia or atelectasis with possible associated pleural effusion. Electronically Signed   By: Marijo Conception, M.D.   On: 03/02/2015 18:29   Dg Chest Port 1 View  02/07/2015  CLINICAL DATA:  Aspiration. EXAM: PORTABLE CHEST 1 VIEW COMPARISON:  02/05/2015. FINDINGS:  Orogastric tube noted with tip below left hemidiaphragm. Mediastinum hilar structures normal. Cardiomegaly. No pulmonary venous congestion. Mild left base subsegmental atelectasis and/or infiltrate. No pleural effusion or pneumothorax. IMPRESSION: 1. Orogastric tube noted with tip below left hemidiaphragm. 2. Mild left base subsegmental atelectasis and or infiltrate. 3. Mild cardiomegaly.  No pulmonary venous congestion. Electronically Signed   By: Marcello Moores  Register   On: 02/07/2015 07:24   Dg Chest Port 1 View  02/05/2015  CLINICAL DATA:  75 year old male with aspiration. Hypoxia. Initial encounter. EXAM: PORTABLE CHEST 1 VIEW COMPARISON:  Chest CTA 02/03/2015 and earlier. FINDINGS: Portable AP upright view at 0610 hours. Stable lung volumes since March 2016. Calcified aortic atherosclerosis. Stable cardiac size and mediastinal contours. Visualized tracheal air column is within normal limits. No pneumothorax, pulmonary edema or pleural effusion. Chronic streaky left infrahilar opacity. Radiographically, no acute pulmonary opacity. IMPRESSION: Stable radiographic appearance of the chest since  2016, no acute aspiration suspected. See also report of the recent chest CTA 02/03/2015. Electronically Signed   By: Genevie Ann M.D.   On: 02/05/2015 07:53     Assessment and Plan   1. Acute on chronic respiratory failure requiring intubation on 2/6: -Wean vent as tolerated per PCCM -Large mucus plug seen during intubation leading to respiratory arrest, possible etiology  -Nebs and inhalers per PCCM -Central line to be placed by PCCM  2. New onset Afib with RVR: -Currently in sinus rhythm with frequent PACs -Converted with IV metoprolol -Change amlodipine to PO diltiazem for rate control  -He is at high risk of going back into arrhythmia. Should this happen he may need amiodarone, though currently his BP would allow for diltiazem gtt -Hold long term full dose anticoagulation at this time given his acute on chronic  anemia -CHADS2VASc at least 4 (HTN, age x 1, stroke)  3. Elevated troponin: -Likely supply demand ischemia in the setting of patient's acute on chronic respiratory failure and sepsis secondary to left basilar pneumonia  -Troponin mildly elevated and down trending -No anginal symptoms  -Check echo to evaluate LV systolic function and wall motion to compare to study done 02/04/2015. If stable, no further inpatient cardiac evaluation at this time   4. Sepsis in the setting of left basilar pneumonia: -On Rocephin per IM -As above  5. Acute on chronic anemia: -Recommend transfuse to maintain a hgb >8 to 8.5  6. Essential thrombocytosis: -Stable -Per oncology  7. History of hyponatremia: -Stable -Close monitoring  8. History of throat cancer: -Status post feeding tube  9. HTN: -Watch for hypotension with his sepsis -Monitor   10. History of stroke: -Stable   Signed, Christell Faith, PA-C Pager: [phone] 03/05/2015, 7:50 AM

## 2015-03-05 NOTE — Care Management (Signed)
Patient transferred to icu 03/05/2015 from 2A and intubated due to respiratory arrest due to mucus plug..  Currently on ventilator but plan is for  terminal extubation and comfort care.

## 2015-03-05 NOTE — Consult Note (Signed)
Name: Johnny Sanford MRN: [id number] DOB: 09-30-1940    ADMISSION DATE:  03/02/2015   CONSULTATION DATE:  02/02/2015  REFERRING MD :   Sadie Haber Hospitalist  CHIEF COMPLAINT:  Cardiopulmonary arrest   HISTORY OF PRESENT ILLNESS:  [name] is a 75 y.o. male with a known history of hypertension,throat cancer s/p radiation, Dysphagia s/p G-tube, COPD and CVA who presented from home with worsening shortness of breath and a non-productive cough. He is on oxygen by nasal cannular for chronic respiratory failure and COPD. He also complains of fever and chills, and generalized weakness. His chest x-ray show pneumonia and he was started on empiric antibiotics. His cultures grew gram negative rods( serratia marcescens). He was responding well to to treatment when on 03-06-22 at 6 am, patient coded after complaining that he was choking. CPR was performed with ROSC. A large mucus plug was noted in his airway during intubation. Patient's family is present and indicates that patient did not want to be intubated/or maintained on life support. After an extensive conversation with patient's son and wife, the decision was made to extubate patient and make him full comfort care.  Of note, patient was hospitalized 01/06-01/16 for hyponatremia, and acute respiratory failure 2/2 aspiration pneumonia and coded during that hospitalization. Per his wife, his quality of life continues to deteriorate after each hospitalization.   PAST MEDICAL HISTORY :   has a past medical history of HLD (hyperlipidemia); HTN (hypertension); GERD (gastroesophageal reflux disease); Hypothyroidism; Cancer ([place]) (1991); CVA (cerebral vascular accident) ([place]); DA (degenerative arthritis); RA (rheumatoid arthritis) ([place]); Adrenal insufficiency ([place]); Cervical spinal stenosis; [place]; Collagen vascular disease ([place]); and COPD (chronic obstructive pulmonary disease) ([place]).  has past surgical history that includes Carotid endarterectomy; Skin  lesion excision; and PEG placement (N/A, 02/09/2015). Prior to Admission medications   Medication Sig Start Date End Date Taking? Authorizing Provider  aspirin 81 MG EC tablet 162 mg by Feeding Tube route daily.    Yes Historical Provider, MD  clonazePAM (KLONOPIN) 0.5 MG tablet 0.5 mg by Feeding Tube route 3 (three) times daily as needed for anxiety.   Yes Historical Provider, MD  escitalopram (LEXAPRO) 20 MG tablet 20 mg by Feeding Tube route daily.    Yes Historical Provider, MD  fludrocortisone (FLORINEF) 0.1 MG tablet 0.1 mg by Feeding Tube route daily.   Yes Historical Provider, MD  fluorouracil (EFUDEX) 5 % cream Apply 1 application topically as needed (for skin spots).    Yes Historical Provider, MD  fluticasone (FLONASE) 50 MCG/ACT nasal spray Place 1 spray into both nostrils daily as needed for rhinitis. 06/24/13  Yes Jackolyn Confer, MD  Fluticasone Furoate-Vilanterol (BREO ELLIPTA) 200-25 MCG/INH AEPB Inhale 200 mcg into the lungs daily. 02/20/15  Yes Jackolyn Confer, MD  HYDROcodone-acetaminophen (NORCO/VICODIN) 5-325 MG tablet Place 1 tablet into feeding tube every 4 (four) hours as needed for moderate pain. 02/12/15  Yes Theodoro Grist, MD  hydroxyurea (HYDREA) 500 MG capsule 1,500 mg by Feeding Tube route at bedtime.   Yes Historical Provider, MD  ipratropium-albuterol (DUONEB) 0.5-2.5 (3) MG/3ML SOLN Take 3 mLs by nebulization every 4 (four) hours as needed. Patient taking differently: Take 3 mLs by nebulization every 4 (four) hours as needed (for wheezing/shortness of breath).  02/12/15  Yes Theodoro Grist, MD  levothyroxine (SYNTHROID, LEVOTHROID) 137 MCG tablet 137 mcg by Feeding Tube route daily before breakfast.   Yes Historical Provider, MD  Nutritional Supplements (FEEDING SUPPLEMENT, JEVITY 1.5 CAL/FIBER,) LIQD Place 360 mLs  into feeding tube 4 (four) times daily. 02/12/15  Yes Theodoro Grist, MD  ranitidine (ZANTAC) 150 MG tablet 150 mg by Feeding Tube route 2 (two) times daily.    Yes Historical Provider, MD   Allergies  Allergen Reactions  . Simvastatin Anxiety    FAMILY HISTORY:  family history includes Heart disease in his father. SOCIAL HISTORY:  reports that he quit smoking about 13 years ago. His smoking use included Cigarettes. He quit after 15 years of use. He has never used smokeless tobacco. He reports that he does not drink alcohol or use illicit drugs.  REVIEW OF SYSTEMS:   Constitutional: Negative for fever and chills.  HENT: Negative for congestion and rhinorrhea.  Eyes: Negative for redness and visual disturbance.  Respiratory: Positive for shortness of breath, cough but negative forwheezing.  Cardiovascular: positive for chest pain but negative for palpitations.  Gastrointestinal: Negative  for nausea , vomiting and abdominal pain and loose stools Genitourinary: Negative for dysuria and urgency.  Musculoskeletal: Negative for myalgias and arthralgias   Skin: Negative for pallor and wound.  Neurological: Negative for dizziness and headaches   SUBJECTIVE:   VITAL SIGNS: Temp:  [97.6 F (36.4 C)-98.7 F (37.1 C)] 97.6 F (36.4 C) (02/06 0700) Pulse Rate:  [28-140] 70 (02/06 0829) Resp:  [18-19] 19 (02/06 0829) BP: (70-185)/(40-126) 117/80 mmHg (02/06 0900) SpO2:  [81 %-100 %] 100 % (02/06 0829) FiO2 (%):  [50 %] 50 % (02/06 0829) Weight:  [142 lb 6.7 oz (64.6 kg)] 142 lb 6.7 oz (64.6 kg) (02/06 0730)  PHYSICAL EXAMINATION: General: Chronically ill looking male, NAD Neuro: AAO X 2, speech is normal, no focal deficits HEENT: PERRLA, oral mucosa dry Cardiovascular: RRR, S1/S2, + pulses Lungs: Respirations unlabored, bilateral airflow, +rhonchi, pain with gentle chest wall palpation Abdomen:  G-Tube, soft, NT/ND, normal bowel sounds Musculoskeletal: +rom, no deformities Skin: warm, no rash   Recent Labs Lab 03/02/15 1556 03/03/15 0316 03/04/15 0748  NA 131* 132* 134*  K 4.2 4.7 4.3  CL 90* 92* 91*  CO2 34* 34* 34*  BUN 26*  20 22*  CREATININE 0.55* 0.53* 0.51*  GLUCOSE 140* 169* 148*    Recent Labs Lab 03/03/15 0316 03/04/15 0748 03/05/15 0638  HGB 8.2* 8.5* 7.7*  HCT 24.5* 25.6* 22.4*  WBC 3.5* 4.6 7.0  PLT 864* 1065* 1137*   Dg Chest 2 View  03/04/2015  CLINICAL DATA:  Hospitalized since yesterday, short of breath EXAM: CHEST  2 VIEW COMPARISON:  Radiograph 03/02/2015 FINDINGS: Normal cardiac silhouette. There is LEFT lower lobe opacity. There is chronic elevation of RIGHT hemidiaphragm. There is scattered fine airspace disease similar to comparison exam. IMPRESSION: 1. No significant change. 2. LEFT lower lobe atelectasis versus infiltrate 3. Perihilar fine airspace opacity representing edema or infection. Electronically Signed   By: Suzy Bouchard M.D.   On: 03/04/2015 15:23   Portable Chest Xray  03/05/2015  CLINICAL DATA:  Hypoxia EXAM: PORTABLE CHEST 1 VIEW COMPARISON:  March 04, 2015 FINDINGS: Endotracheal tube tip is 2.4 cm above the carina. There is no demonstrable pneumothorax. There is airspace consolidation in the left base region. There is mild scarring in each upper lobe near the apices. Lungs elsewhere clear. Heart is slightly enlarged with pulmonary vascularity within normal limits. No adenopathy. There is atherosclerotic calcification in the aorta. IMPRESSION: Endotracheal tube as described without pneumothorax. Left base consolidation. Electronically Signed   By: Lowella Grip III M.D.   On: 03/05/2015 07:08    STUDIES:  None  CULTURES: 02/03: Blood cultures positive for gram negative rods-Serratia marcescens  ANTIBIOTICS: Ceftriaxone   SIGNIFICANT EVENTS: 02/03 Admitted with pneumonia 02/06 Cardiac arrest>Intubated>made comfort care, extubated  LINES/TUBES: PIVs  DISCUSSION: 75 yo male with HCAP and acute respiratory failure s/p cardiopulmonary arrest  ASSESSMENT / PLAN:  PULMONARY A: Cardiopulmonary arrest Acute respiratory failure HCAP P:   -Terminally  extubated -Empiric antibiotics -No further cultures  -Monitor and treat fever -DNR/DNI/COMFORT Care -Oral care -Robinul for excessive secretions  CARDIOVASCULAR A:  S/p cardiac arrest H/O Hypertension P:  D/C telemetry and antihypertensives  GASTROINTESTINAL A:   Dysphagia s/p G-tube P:   -TF needs and water flushes at home dose -No labs -Zofran prn  HEMATOLOGIC A:   Anemia  Thrombocythermia P:  -No labs/no transfusion -Hydroxyurea at home dose  INFECTIOUS A:   HCAP P:   Abx as above   NEUROLOGIC A:   Pain management P:   RASS goal:  -Morphine gtt, titrate for pain/comfort -Haldol prn    FAMILY  - Family/Inter-disciplinary family meeting: Dr. Alva Garnet and myself had a lengthy meeting with patient's wife, son and wife's sister. They all stated that patient has a living will and will not want to be kept "alive on machines". They also stated that patient was not happy with his quality of life hence will not want any life-prolonging treatments. His current diagnosis, treatment plan and possible prognosis reviewed with family. They decided that patient should be terminally extubated and the goal of care should be comfort. DNR status documented.     Magdalene S. [facility] ANP-BC Pulmonary and Critical Care Medicine [facility] Pager: [phone] 03/05/2015, 9:32 AM   I have evaluated patient with ANP Patria Mane, reviewed database in its entirety and discussed care plan in detail. In addition, this patient was discussed on multidisciplinary rounds.   Important exam findings: Frail, appears much older than stated age Intubated, sedated Diffuse rhonchi Reg, no M Abdomen soft, G tube site clean Ext cool without edema  After extubation, RASS 0, -1 No focal deficits Able to F/C despite morphine 5 mg/hr Reports pain despite morphine SpO2 87% - mildly labored respirations  Major problems addressed by PCCM team: Cardiopulmonary arrest Acute hypoxic  respiratory failure Hypotension Chronic dysphagia G tube status Aspiration PNA  PLAN/REC: I met this patient's family this AM to discuss placement of CVL to manage hypotension. The patient's wife, son and sister were all in agreement that he would not want to undergo the kind of care undertaken this AM. I offered that we could support him for a couple of days to see how things would go but I made clear that, even if brief, this type of hospitalization would lead to further decrement in his QOL and functional status. They expressed that he was dissatisfied with his QOL even before this hospitalization and therefore wished to proceed with extubation. This has now been undertaken and pt is maintained on a morphine infusion to control dyspnea and pain. We are, for now, continuing his antibiotics but his comfort is our highest priority and I have instructed the RN to ensure that he receives enough morphine to ensure his comfort  CCM time: 45 mins The above time includes time spent in consultation with patient and/or family members and reviewing care plan on multidisciplinary rounds  Merton Border, MD PCCM service Mobile [phone] Pager [phone]

## 2015-03-05 NOTE — Progress Notes (Signed)
Pt received from 2A. Pt was intubated on the floor. RT hooked him up to the ventilator. Pt placed on the monitor. HR in 130s and BP elevated. Propofol started per Dr. Orbie Pyo.

## 2015-03-05 NOTE — Progress Notes (Signed)
Brief Nutrition Note:   Pt s/p one-way extubation this AM, DNR, plan for morphine drip,  transition to comfort care. NPO, TF orders discontinued by MD this AM.  No further nutrition interventions warranted at this time.  Please re-consult as needed.   Kerman Passey [place], [place], LDN [phone] Pager  [phone] Weekend/On-Call Pager

## 2015-03-05 NOTE — Plan of Care (Signed)
Problem: Phase II Progression Outcomes Goal: Date pt extubated/weaned off vent Outcome: Completed/Met Date Met:  03/05/15 Pt extubated and currently on 3L O2 96% Goal: Time pt extubated/weaned off vent Outcome: Completed/Met Date Met:  03/05/15 Pt extubated at 1100 03/05/2015. Goal: Progress activities as ordered Outcome: Not Progressing Pt family decided to make pt's main goal comfort care. Pt is currently on bedrest.  Goal: Tolerating prescribed nutrition plan Outcome: Completed/Met Date Met:  03/05/15 Pt on home regimen jevity 1.5 bolus feeds.  Goal: Pain controlled Outcome: Progressing Pt currently states pain of 0/10 on 27m Morphine gtt.  Goal: Code status re-addressed Outcome: Completed/Met Date Met:  03/05/15 Pt made DNR/DNI Goal: Discharge/transfer plan updated Outcome: Progressing Pt to remain in unit tonight unless pressed for beds then to transferred out to the floor.

## 2015-03-05 NOTE — Progress Notes (Signed)
[place] [facility] [place] NAME: Johnny Sanford    MR#:  [id number]  DATE OF BIRTH:  06-Apr-1940  SUBJECTIVE:  CHIEF COMPLAINT:   Chief Complaint  Patient presents with  . Shortness of Breath   -Patient got intubated this morning after CODE BLUE was called for acute hypoxic respiratory distress. -Intensivist already spoke with his wife this morning who expressed concerns that patient does not want to be on ventilator and requested comfort measures. -This morning I saw him while he was still on the vent and on propofol drip.  REVIEW OF SYSTEMS:  Review of Systems  Unable to perform ROS: critical illness    DRUG ALLERGIES:   Allergies  Allergen Reactions  . Simvastatin Anxiety    VITALS:  Blood pressure 117/80, pulse 70, temperature 97.6 F (36.4 C), temperature source Axillary, resp. rate 19, height 5\' 4"  (1.626 m), weight 64.6 kg (142 lb 6.7 oz), SpO2 100 %.  PHYSICAL EXAMINATION:  Physical Exam  GENERAL:  75 y.o.-year-old patient, intubated, on the vent EYES: Pupils equal, round, reactive to light and accommodation. No scleral icterus. Extraocular muscles intact.  HEENT: Head atraumatic, normocephalic. Oropharynx and nasopharynx clear. Intubated and sedated. NECK:  Supple, no jugular venous distention. No thyroid enlargement, no tenderness.  LUNGS:  scattered wheezing and rhonchi.  No rales or crepitation. No use of accessory muscles of respiration.  CARDIOVASCULAR: S1, S2 normal. No murmurs, rubs, or gallops.  ABDOMEN: Soft, nontender, nondistended. Bowel sounds present. No organomegaly or mass.  EXTREMITIES: No pedal edema, cyanosis, or clubbing.  NEUROLOGIC: sedated PSYCHIATRIC: sedated  SKIN: No obvious rash, lesion, or ulcer.    LABORATORY PANEL:   CBC  Recent Labs Lab 03/05/15 0638  WBC 7.0  HGB 7.7*  HCT 22.4*  PLT 1137*    ------------------------------------------------------------------------------------------------------------------  Chemistries   Recent Labs Lab 03/02/15 1556  03/04/15 0748  NA 131*  < > 134*  K 4.2  < > 4.3  CL 90*  < > 91*  CO2 34*  < > 34*  GLUCOSE 140*  < > 148*  BUN 26*  < > 22*  CREATININE 0.55*  < > 0.51*  CALCIUM 8.4*  < > 8.4*  AST 19  --   --   ALT 21  --   --   ALKPHOS 52  --   --   BILITOT 0.5  --   --   < > = values in this interval not displayed. ------------------------------------------------------------------------------------------------------------------  Cardiac Enzymes  Recent Labs Lab 03/03/15 0842  TROPONINI 0.21*   ------------------------------------------------------------------------------------------------------------------  RADIOLOGY:  Dg Chest 2 View  03/04/2015  CLINICAL DATA:  Hospitalized since yesterday, short of breath EXAM: CHEST  2 VIEW COMPARISON:  Radiograph 03/02/2015 FINDINGS: Normal cardiac silhouette. There is LEFT lower lobe opacity. There is chronic elevation of RIGHT hemidiaphragm. There is scattered fine airspace disease similar to comparison exam. IMPRESSION: 1. No significant change. 2. LEFT lower lobe atelectasis versus infiltrate 3. Perihilar fine airspace opacity representing edema or infection. Electronically Signed   By: Suzy Bouchard M.D.   On: 03/04/2015 15:23   Portable Chest Xray  03/05/2015  CLINICAL DATA:  Hypoxia EXAM: PORTABLE CHEST 1 VIEW COMPARISON:  March 04, 2015 FINDINGS: Endotracheal tube tip is 2.4 cm above the carina. There is no demonstrable pneumothorax. There is airspace consolidation in the left base region. There is mild scarring in each upper lobe near the apices. Lungs elsewhere clear. Heart is slightly enlarged with  pulmonary vascularity within normal limits. No adenopathy. There is atherosclerotic calcification in the aorta. IMPRESSION: Endotracheal tube as described without pneumothorax. Left  base consolidation. Electronically Signed   By: Lowella Grip III M.D.   On: 03/05/2015 07:08    EKG:   Orders placed or performed during the hospital encounter of 03/02/15  . EKG 12-Lead  . 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  CODE BLUE was called for respiratory failure related this morning requiring intubation. Large mucous plug was noted during intubation. Patient was in ICU on propofol drip. Wife has told the intensivist that he would not want to be resuscitated and requested comfort care. So patient will be extubated and placed on morphine and will be transferred out of ICU.   He has the following diagnoses:   # Acute on chronic hypoxic respiratory failure # Sepsis- secondary to pneumonia- serratia bacteremia # Atrial fibrillation with rapid ventricular response #. Adrenal insufficiency # depression and anxiety #hypothyroidism # thrombocythemia  # Dysphagia # hypertension #Chronic anemia #History of throat cancer status post treatment.   Patient is DO NOT RESUSCITATE now.    All the records are reviewed and case discussed with Care Management/Social Workerr. Management plans discussed with the patient, family and they are in agreement.  CODE STATUS: DNR  TOTAL TIME TAKING CARE OF THIS PATIENT: 25  minutes.     Carry Weesner M.D on 03/05/2015 at 12:04 PM  Between 7am to 6pm - Pager - [phone]  After 6pm go to [email or web address] - password [email or web address] [facility]  Office  [phone]  CC: Primary care physician; Rica Mast, MD

## 2015-03-05 NOTE — Progress Notes (Signed)
Pharmacy Antibiotic Note  Johnny Sanford is a 75 y.o. male admitted on 03/02/2015 with bacteremia.  Pharmacy has been consulted for ceftriaxone dosing.   Plan: Will continue ceftriaxone 2 g iv q 24 hours.   Height: 5\' 4"  (162.6 cm) Weight: 142 lb 6.7 oz (64.6 kg) IBW/kg (Calculated) : 59.2  Temp (24hrs), Avg:97.7 F (36.5 C), Min:97.4 F (36.3 C), Max:98.1 F (36.7 C)   Recent Labs Lab 02/28/15 1812 02/28/15 2100 03/02/15 1554 03/02/15 1556 03/02/15 2059 03/03/15 0316 03/04/15 0748 03/05/15 0638  WBC 6.5  --   --  3.8  --  3.5* 4.6 7.0  CREATININE 0.71 0.58*  --  0.55*  --  0.53* 0.51*  --   LATICACIDVEN  --   --  1.6  --  1.5  --   --   --     Estimated Creatinine Clearance: 68.9 mL/min (by C-G formula based on Cr of 0.51).    Allergies  Allergen Reactions  . Simvastatin Anxiety    Antimicrobials this admission: Anti-infectives    Start     Dose/Rate Route Frequency Ordered Stop   03/06/15 1800  cefTRIAXone (ROCEPHIN) 2 g in dextrose 5 % 50 mL IVPB  Status:  Discontinued     2 g 100 mL/hr over 30 Minutes Intravenous Every 24 hours 03/05/15 1311 03/05/15 1337   03/05/15 1800  cefTRIAXone (ROCEPHIN) 2 g in dextrose 5 % 50 mL IVPB     2 g 100 mL/hr over 30 Minutes Intravenous Every 24 hours 03/05/15 1337     03/05/15 1315  cefTRIAXone (ROCEPHIN) 2 g in dextrose 5 % 50 mL IVPB  Status:  Discontinued     2 g 100 mL/hr over 30 Minutes Intravenous  Once 03/05/15 1311 03/05/15 1337   03/03/15 1700  cefTRIAXone (ROCEPHIN) 2 g in dextrose 5 % 50 mL IVPB  Status:  Discontinued     2 g 100 mL/hr over 30 Minutes Intravenous Every 24 hours 03/03/15 0903 03/05/15 0927   03/03/15 0400  ceFEPIme (MAXIPIME) 2 g in dextrose 5 % 50 mL IVPB  Status:  Discontinued     2 g 100 mL/hr over 30 Minutes Intravenous Every 12 hours 03/02/15 1938 03/03/15 0853   03/03/15 0000  ceFEPIme (MAXIPIME) 2 g in dextrose 5 % 50 mL IVPB  Status:  Discontinued     2 g 100 mL/hr over 30 Minutes  Intravenous  Once 03/02/15 1747 03/02/15 1826   03/03/15 0000  vancomycin (VANCOCIN) IVPB 1000 mg/200 mL premix     1,000 mg 200 mL/hr over 60 Minutes Intravenous  Once 03/02/15 1747 03/03/15 0100   03/02/15 2200  vancomycin (VANCOCIN) IVPB 750 mg/150 ml premix  Status:  Discontinued     750 mg 150 mL/hr over 60 Minutes Intravenous Every 12 hours 03/02/15 1747 03/03/15 0853   03/02/15 1530  ceFEPIme (MAXIPIME) 2 g in dextrose 5 % 50 mL IVPB     2 g 100 mL/hr over 30 Minutes Intravenous  Once 03/02/15 1519 03/02/15 1650   03/02/15 1530  vancomycin (VANCOCIN) IVPB 1000 mg/200 mL premix     1,000 mg 200 mL/hr over 60 Minutes Intravenous  Once 03/02/15 1519 03/02/15 1704      Microbiology results: Results for orders placed or performed during the hospital encounter of 03/02/15  Urine culture     Status: None   Collection Time: 03/02/15  3:18 PM  Result Value Ref Range Status   Specimen Description URINE, RANDOM  Final  Special Requests NONE  Final   Culture MULTIPLE SPECIES PRESENT, SUGGEST RECOLLECTION  Final   Report Status 03/04/2015 FINAL  Final  Blood Culture (routine x 2)     Status: None   Collection Time: 03/02/15  3:45 PM  Result Value Ref Range Status   Specimen Description BLOOD RIGHT FOREARM  Final   Special Requests BOTTLES DRAWN AEROBIC AND ANAEROBIC  3CC  Final   Culture  Setup Time   Final    GRAM NEGATIVE RODS IN BOTH AEROBIC AND ANAEROBIC BOTTLES SERRATIA MARCESCENS CRITICAL RESULT CALLED TO, READ BACK BY AND VERIFIED WITH: Roe Coombs 03/03/2015 0844 BY J RAZZAKSUAREZ,MT    Culture   Final    SERRATIA MARCESCENS IN BOTH AEROBIC AND ANAEROBIC BOTTLES    Report Status 03/05/2015 FINAL  Final   Organism ID, Bacteria SERRATIA MARCESCENS  Final      Susceptibility   Serratia marcescens - MIC*    CEFAZOLIN >=64 RESISTANT Resistant     CEFEPIME <=1 SENSITIVE Sensitive     CEFTAZIDIME <=1 SENSITIVE Sensitive     CEFTRIAXONE <=1 SENSITIVE Sensitive      CIPROFLOXACIN <=0.25 SENSITIVE Sensitive     GENTAMICIN <=1 SENSITIVE Sensitive     TRIMETH/SULFA <=20 SENSITIVE Sensitive     * SERRATIA MARCESCENS  Blood Culture ID Panel (Reflexed)     Status: Abnormal   Collection Time: 03/02/15  3:45 PM  Result Value Ref Range Status   Enterococcus species NOT DETECTED NOT DETECTED Final   Listeria monocytogenes NOT DETECTED NOT DETECTED Final   Staphylococcus species NOT DETECTED NOT DETECTED Final   Staphylococcus aureus NOT DETECTED NOT DETECTED Final   Streptococcus species NOT DETECTED NOT DETECTED Final   Streptococcus agalactiae NOT DETECTED NOT DETECTED Final   Streptococcus pneumoniae NOT DETECTED NOT DETECTED Final   Streptococcus pyogenes NOT DETECTED NOT DETECTED Final   Acinetobacter baumannii NOT DETECTED NOT DETECTED Final   Enterobacteriaceae species DETECTED (A) NOT DETECTED Final    Comment: CRITICAL RESULT CALLED TO, READ BACK BY AND VERIFIED WITH: Roe Coombs 03/03/2015 0844 BY J RAZZAKSUAREZ,MT    Enterobacter cloacae complex NOT DETECTED NOT DETECTED Final   Escherichia coli NOT DETECTED NOT DETECTED Final   Klebsiella oxytoca NOT DETECTED NOT DETECTED Final   Klebsiella pneumoniae NOT DETECTED NOT DETECTED Final   Proteus species NOT DETECTED NOT DETECTED Final   Serratia marcescens DETECTED (A) NOT DETECTED Final    Comment: CRITICAL RESULT CALLED TO, READ BACK BY AND VERIFIED WITH: Roe Coombs 03/03/2015 0844 BY J RAZZAKSUAREZ,MT    Haemophilus influenzae NOT DETECTED NOT DETECTED Final   Neisseria meningitidis NOT DETECTED NOT DETECTED Final   Pseudomonas aeruginosa NOT DETECTED NOT DETECTED Final   Candida albicans NOT DETECTED NOT DETECTED Final   Candida glabrata NOT DETECTED NOT DETECTED Final   Candida krusei NOT DETECTED NOT DETECTED Final   Candida parapsilosis NOT DETECTED NOT DETECTED Final   Candida tropicalis NOT DETECTED NOT DETECTED Final   Carbapenem resistance NOT DETECTED NOT DETECTED Final    Methicillin resistance NOT DETECTED NOT DETECTED Final   Vancomycin resistance NOT DETECTED NOT DETECTED Final  Blood Culture (routine x 2)     Status: None   Collection Time: 03/02/15  3:53 PM  Result Value Ref Range Status   Specimen Description BLOOD RIGHT ANTECUBITAL  Final   Special Requests BOTTLES DRAWN AEROBIC AND ANAEROBIC  3CC  Final   Culture  Setup Time   Final    GRAM NEGATIVE RODS  IN BOTH AEROBIC AND ANAEROBIC BOTTLES CRITICAL VALUE NOTED.  VALUE IS CONSISTENT WITH PREVIOUSLY REPORTED AND CALLED VALUE.    Culture   Final    SERRATIA MARCESCENS IN BOTH AEROBIC AND ANAEROBIC BOTTLES    Report Status 03/05/2015 FINAL  Final   Organism ID, Bacteria SERRATIA MARCESCENS  Final      Susceptibility   Serratia marcescens - MIC*    CEFAZOLIN >=64 RESISTANT Resistant     CEFEPIME <=1 SENSITIVE Sensitive     CEFTAZIDIME <=1 SENSITIVE Sensitive     CEFTRIAXONE <=1 SENSITIVE Sensitive     CIPROFLOXACIN <=0.25 SENSITIVE Sensitive     GENTAMICIN <=1 SENSITIVE Sensitive     TRIMETH/SULFA <=20 SENSITIVE Sensitive     * SERRATIA MARCESCENS  MRSA PCR Screening     Status: None   Collection Time: 03/05/15  6:41 AM  Result Value Ref Range Status   MRSA by PCR NEGATIVE NEGATIVE Final    Comment:        The GeneXpert MRSA Assay (FDA approved for NASAL specimens only), is one component of a comprehensive MRSA colonization surveillance program. It is not intended to diagnose MRSA infection nor to guide or monitor treatment for MRSA infections.     Thank you for allowing pharmacy to be a part of this patient's care.  Roe Coombs, PharmD Pharmacy Resident 03/05/2015

## 2015-03-05 NOTE — Progress Notes (Signed)
RN at bedside given morning medication when pt stated " im choking". RN attempted to suction mouth. Pt went unresponsive. CPR was started immediately. Pt was intubated and transferred to ICU 3. Bedside report given to [place], RN

## 2015-03-05 NOTE — Progress Notes (Signed)
Extubated patient per md order.  Britney killingsworth RN present at bedside.  Placed patient on 3lpm nasal cannula

## 2015-03-05 NOTE — Progress Notes (Signed)
Code blue called this morning at 6am Patient unresponsive. CPR started and patient was electively intubated by ER physician DR Beather Arbour. Patient had BP 170/80 and pulse was 140/min Patient had respiratory arrest and large mucus plug noted during intubation. Ventilator protocol started and IV propofol drip started. Patient has pneumonia and gram negative bacteremia and on antibiotics. Intensivist consult ordered.

## 2015-03-05 NOTE — Progress Notes (Signed)
Chaplain rounded in the unit and provided a compassionate presence and support to the patient. Said silent prayer. Chaplain Katina Remick [phone] 

## 2015-03-06 LAB — GLUCOSE, CAPILLARY
GLUCOSE-CAPILLARY: 121 mg/dL — AB (ref 65–99)
GLUCOSE-CAPILLARY: 103 mg/dL — AB (ref 65–99)
GLUCOSE-CAPILLARY: 95 mg/dL (ref 65–99)
GLUCOSE-CAPILLARY: 126 mg/dL — AB (ref 65–99)

## 2015-03-06 MED ORDER — MORPHINE SULFATE (PF) 2 MG/ML IV SOLN
2.0000 mg | INTRAVENOUS | Status: DC | PRN
  Administered 2015-03-06 – 2015-03-07 (×4): 2 mg via INTRAVENOUS
  Filled 2015-03-06 (×5): qty 1

## 2015-03-06 MED ORDER — FENTANYL 75 MCG/HR TD PT72
75.0000 ug | MEDICATED_PATCH | TRANSDERMAL | Status: DC
  Administered 2015-03-06: 75 ug via TRANSDERMAL
  Filled 2015-03-06: qty 1

## 2015-03-06 MED ORDER — SCOPOLAMINE 1 MG/3DAYS TD PT72
1.0000 | MEDICATED_PATCH | TRANSDERMAL | Status: DC
  Filled 2015-03-06: qty 1

## 2015-03-06 NOTE — Clinical Documentation Improvement (Signed)
Internal Medicine  Can the diagnosis of acute on chronic anemia be further specified? Please document findings in next progress note NOT in BPA drop down box. Thanks.   Iron deficiency Anemia  Nutritional anemia, including the nutrition or mineral deficits  Acute on Chronic Blood Loss Anemia, including the suspected or known cause  Anemia of chronic disease, including the associated chronic disease state  Other  Clinically Undetermined  Document any associated diagnoses/conditions.  Supporting Information:  H&H running from 8.5/25.5 on admission to 7.7/22.4 on the 6th  Please exercise your independent, professional judgment when responding. A specific answer is not anticipated or expected.  Thank You,  [place] [phone]

## 2015-03-06 NOTE — Progress Notes (Signed)
Pt currently on 5L O2 via nasal canula with O2 sats at 93%; pt alert and oriented; no c/o pain with continuous morphine drip at 3 mg/hr; adequate uop via foley; sinus rhythm on cardiac monitor will continue to monitor and assess pt

## 2015-03-06 NOTE — Progress Notes (Signed)
Talked to Dr. Molli Hazard and he gave me order for comfort measures only.   Keep on non-rebreather .  Do not suction.

## 2015-03-06 NOTE — Progress Notes (Signed)
Name: Johnny Sanford MRN: [id number] DOB: 09-02-1940    ADMISSION DATE:  03/02/2015   CONSULTATION DATE:  02/02/2015  REFERRING MD :   Sadie Haber Hospitalist  CHIEF COMPLAINT:  Cardiopulmonary arrest   HISTORY OF PRESENT ILLNESS:  [name] is a 75 y.o. male with a known history of hypertension,throat cancer s/p radiation, Dysphagia s/p G-tube, COPD and CVA who presented from home with worsening shortness of breath and a non-productive cough. He is on oxygen by nasal cannular for chronic respiratory failure and COPD. He also complains of fever and chills, and generalized weakness. His chest x-ray show pneumonia and he was started on empiric antibiotics. His cultures grew gram negative rods( serratia marcescens). He was responding well to to treatment when on 03-27-22 at 6 am, patient coded after complaining that he was choking. CPR was performed with ROSC. A large mucus plug was noted in his airway during intubation. Patient's family is present and indicates that patient did not want to be intubated/or maintained on life support. After an extensive conversation with patient's son and wife, the decision was made to extubate patient and make him full comfort care.  Of note, patient was hospitalized 01/06-01/16 for hyponatremia, and acute respiratory failure 2/2 aspiration pneumonia and coded during that hospitalization. Per his wife, his quality of life continues to deteriorate after each hospitalization.   SUBJECTIVE: Doing well; reports improvement in chest wall pain. No acute issues overnight  VITAL SIGNS: Temp:  [97.4 F (36.3 C)-98.3 F (36.8 C)] 97.9 F (36.6 C) (02/07 0900) Pulse Rate:  [63-92] 77 (02/07 0600) Resp:  [10-25] 12 (02/07 0600) BP: (87-142)/(64-91) 112/75 mmHg (02/07 0600) SpO2:  [87 %-100 %] 94 % (02/07 0600)  PHYSICAL EXAMINATION: General: Chronically ill looking male, NAD Neuro: AAO X 2, speech is normal, no focal deficits HEENT: PERRLA, oral mucosa  dry Cardiovascular: RRR, S1/S2, + pulses Lungs: Respirations unlabored, bilateral airflow, +rhonchi, pain with gentle chest wall palpation Abdomen:  G-Tube, soft, NT/ND, normal bowel sounds Musculoskeletal: +rom, no deformities Skin: warm, no rash   Recent Labs Lab 03/02/15 1556 03/03/15 0316 03/04/15 0748  NA 131* 132* 134*  K 4.2 4.7 4.3  CL 90* 92* 91*  CO2 34* 34* 34*  BUN 26* 20 22*  CREATININE 0.55* 0.53* 0.51*  GLUCOSE 140* 169* 148*    Recent Labs Lab 03/03/15 0316 03/04/15 0748 2015-03-28 0638  HGB 8.2* 8.5* 7.7*  HCT 24.5* 25.6* 22.4*  WBC 3.5* 4.6 7.0  PLT 864* 1065* 1137*   Dg Chest 2 View  03/04/2015  CLINICAL DATA:  Hospitalized since yesterday, short of breath EXAM: CHEST  2 VIEW COMPARISON:  Radiograph 03/02/2015 FINDINGS: Normal cardiac silhouette. There is LEFT lower lobe opacity. There is chronic elevation of RIGHT hemidiaphragm. There is scattered fine airspace disease similar to comparison exam. IMPRESSION: 1. No significant change. 2. LEFT lower lobe atelectasis versus infiltrate 3. Perihilar fine airspace opacity representing edema or infection. Electronically Signed   By: Suzy Bouchard M.D.   On: 03/04/2015 15:23   Portable Chest Xray  2015/03/28  CLINICAL DATA:  Hypoxia EXAM: PORTABLE CHEST 1 VIEW COMPARISON:  March 04, 2015 FINDINGS: Endotracheal tube tip is 2.4 cm above the carina. There is no demonstrable pneumothorax. There is airspace consolidation in the left base region. There is mild scarring in each upper lobe near the apices. Lungs elsewhere clear. Heart is slightly enlarged with pulmonary vascularity within normal limits. No adenopathy. There is atherosclerotic calcification in the aorta. IMPRESSION: Endotracheal  tube as described without pneumothorax. Left base consolidation. Electronically Signed   By: Lowella Grip III M.D.   On: 03/05/2015 07:08    STUDIES:  None  CULTURES: 02/03: Blood cultures positive for gram negative  rods-Serratia marcescens  ANTIBIOTICS: Ceftriaxone 02/06>>  SIGNIFICANT EVENTS: 02/03 Admitted with pneumonia 02/06 Cardiac arrest>Intubated>made comfort care, extubated  LINES/TUBES: PIVs  DISCUSSION: 75 yo male with HCAP and acute respiratory failure s/p cardiopulmonary arrest  ASSESSMENT / PLAN:  PULMONARY A: Cardiopulmonary arrest Acute respiratory failure HCAP P:   -Supplemental O2 for comfort -Continue antibiotics -No further cultures  -Monitor and treat fever -DNR/DNI/COMFORT Care -Oral care -Robinul for excessive secretions  CARDIOVASCULAR A:  S/p cardiac arrest H/O Hypertension P:  -D/C telemetry and antihypertensives  GASTROINTESTINAL A:   Dysphagia s/p G-tube P:   -TF needs and water flushes at home dose -No labs -Zofran prn  HEMATOLOGIC A:   Anemia  Thrombocythermia P:  -No labs/no transfusion -Hydroxyurea at home dose  INFECTIOUS A:   HCAP P:   Abx as above   NEUROLOGIC A:   Pain management P:   RASS goal:  -Morphine gtt, titrate for pain/comfort -Haldol prn   Transfers to Med-surg under hospitalist service. PCCM will sign-off at this time.   FAMILY  - No family at beside 02/07 during rounds.    Magdalene S. Tukov ANP-BC Pulmonary and Critical Care Medicine [place] Pager: [phone] 03/06/2015, 9:47 AM   PCCM ATTENDING ATTESTATION: I have evaluated patient with ANP Patria Mane, reviewed database in its entirety and discussed care plan in detail. In addition, this patient was discussed on multidisciplinary rounds. I agree with the documentation above. The patient is comfortable on morphine infusion. I have ordered a fentanyl patch to try to wean off morphine gtt. The patient is full DNR and DNI per my discussion with is wife yesterday. At this point he is stable for transfer to gen med floor to continue the above care with a primary focus on his comfort.   PCCM will sign off. Please call if we can be of further  assistance   Merton Border, MD PCCM service Mobile [phone] Pager [phone]

## 2015-03-06 NOTE — Care Management (Signed)
Patient has been extubated and on 5 liters 02.  Continuous morphine drip and jevity tube feedings. At present, do not see an order for comfort care but documented that plan will be comfort care.

## 2015-03-06 NOTE — Progress Notes (Signed)
[facility] NAME: Ayvin Hargraves    MR#:  [id number]  DATE OF BIRTH:  [birth year]  SUBJECTIVE:  CHIEF COMPLAINT:   Chief Complaint  Patient presents with  . Shortness of Breath   - patient is terminally extubated yesterday, eyes open, thick secretions, on low dose morphine drip for comfort. - discussed with wife Ruffin Frederick about comfort care and  hospice- agreeable.  REVIEW OF SYSTEMS:  Review of Systems  Unable to perform ROS: critical illness    DRUG ALLERGIES:   Allergies  Allergen Reactions  . Simvastatin Anxiety    VITALS:  Blood pressure 101/69, pulse 77, temperature 97.9 F (36.6 C), temperature source Axillary, resp. rate 12, height 5\' 4"  (1.626 m), weight 64.6 kg (142 lb 6.7 oz), SpO2 99 %.  PHYSICAL EXAMINATION:  Physical Exam  GENERAL:  75 y.o.-year-old patient, awake and seems to be in mild respiratory distress Appears critically ill EYES: Pupils equal, round, reactive to light and accommodation. No scleral icterus. Extraocular muscles intact.  HEENT: Head atraumatic, normocephalic. Oropharynx and nasopharynx clear. Able to hear thick secretions, rattling in throat NECK:  Supple, no jugular venous distention. No thyroid enlargement, no tenderness.  LUNGS:  Diffuse scattered wheezing and rhonchi.  No rales or crepitation. use of accessory muscles of respiration noted even at rest.  CARDIOVASCULAR: S1, S2 normal. No murmurs, rubs, or gallops.  ABDOMEN: Soft, nontender, nondistended. Bowel sounds present. No organomegaly or mass.  EXTREMITIES: No pedal edema, cyanosis, or clubbing.  NEUROLOGIC: sedated PSYCHIATRIC: sedated  SKIN: No obvious rash, lesion, or ulcer.    LABORATORY PANEL:   CBC  Recent Labs Lab 03/05/15 0638  WBC 7.0  HGB 7.7*  HCT 22.4*  PLT 1137*   ------------------------------------------------------------------------------------------------------------------  Chemistries    Recent Labs Lab 03/02/15 1556  03/04/15 0748  NA 131*  < > 134*  K 4.2  < > 4.3  CL 90*  < > 91*  CO2 34*  < > 34*  GLUCOSE 140*  < > 148*  BUN 26*  < > 22*  CREATININE 0.55*  < > 0.51*  CALCIUM 8.4*  < > 8.4*  AST 19  --   --   ALT 21  --   --   ALKPHOS 52  --   --   BILITOT 0.5  --   --   < > = values in this interval not displayed. ------------------------------------------------------------------------------------------------------------------  Cardiac Enzymes  Recent Labs Lab 03/03/15 0842  TROPONINI 0.21*   ------------------------------------------------------------------------------------------------------------------  RADIOLOGY:  Portable Chest Xray  03/05/2015  CLINICAL DATA:  Hypoxia EXAM: PORTABLE CHEST 1 VIEW COMPARISON:  March 04, 2015 FINDINGS: Endotracheal tube tip is 2.4 cm above the carina. There is no demonstrable pneumothorax. There is airspace consolidation in the left base region. There is mild scarring in each upper lobe near the apices. Lungs elsewhere clear. Heart is slightly enlarged with pulmonary vascularity within normal limits. No adenopathy. There is atherosclerotic calcification in the aorta. IMPRESSION: Endotracheal tube as described without pneumothorax. Left base consolidation. Electronically Signed   By: Lowella Grip III M.D.   On: 03/05/2015 07:08    EKG:   Orders placed or performed during the hospital encounter of 03/02/15  . EKG 12-Lead  . EKG 12-Lead    ASSESSMENT AND PLAN:   75 year old male with past medical history significant for throat cancer status post radiation, hypertension, COPD, CVA without any neurological deficits, recent admission for hyponatremia and also  dysphagia resulted in PEG tube about 3 weeks ago presents to the hospital secondary to worsening breathing and also hypoxia.  CODE BLUE was called for respiratory failure on 03/05/15 requiring intubation. Large mucous plug was noted during intubation. Wife  has told the intensivist that he would not want to be resuscitated and requested comfort care. So patient will be extubated and placed on morphine and will be transferred out of ICU.   He has the following diagnoses:   # Acute on chronic hypoxic respiratory failure # Sepsis- secondary to pneumonia- serratia bacteremia # Atrial fibrillation with rapid ventricular response #. Adrenal insufficiency # depression and anxiety #hypothyroidism # thrombocythemia  # Dysphagia # hypertension #Chronic anemia #History of throat cancer status post treatment.   Patient is still awake and alert vitals are stable. Discussed with wife about only Comfort Care, discontinued hydroxyurea, Rocephin and other oral medications. Continue morphine drip, ativan, add scopolamine. Also on rubinol. Palliative care consulted. Will be transferred to floor today. -Possible hospice consult tomorrow.  Patient is DO NOT RESUSCITATE now.    All the records are reviewed and case discussed with Care Management/Social Workerr. Management plans discussed with the patient, family and they are in agreement.  CODE STATUS: DNR  TOTAL TIME TAKING CARE OF THIS PATIENT: 37  minutes.     Kacey Dysert M.D on 03/06/2015 at 3:59 PM  Between 7am to 6pm - Pager - [phone]  After 6pm go to [email or web address] - password [email or web address] [facility]  Office  [phone]  CC: Primary care physician; Rica Mast, MD

## 2015-03-06 NOTE — Progress Notes (Signed)
Morning feeding was late due to feeding not being available at time due. Was administered when tube feeding arrived on the floor. Will continue to assess.

## 2015-03-06 NOTE — Progress Notes (Signed)
Patient was transferred from ICU. Lung sounds rhonchi and severe oropharyngeal congestion noted. O2 sats was 81 % so non-re breather mask applied by RT. We will continue to monitor.

## 2015-03-06 NOTE — Clinical Documentation Improvement (Signed)
Internal Medicine  Can the diagnosis of Atrial Fibrillation be further specified? Please document findings in next progress note NOT in BPA drop down box. Thanks!   Chronic Atrial fibrillation  Paroxysmal Atrial fibrillation  Permanent Atrial fibrillation  Persistent Atrial fibrillation  Other  Clinically Undetermined  Document any associated diagnoses/conditions.  Supporting Information:  "Patient developed new onset Afib with RVR overnight with HR into the 170s" (Cardiology 03/05/15 progress note)   Please exercise your independent, professional judgment when responding. A specific answer is not anticipated or expected.  Thank You,  Zoila Shutter RN, BSN, [place] [phone]; Cell: [phone]

## 2015-03-06 NOTE — Progress Notes (Signed)
Pharmacy Antibiotic Note  Johnny Sanford is a 75 y.o. male admitted on 03/02/2015 with bacteremia.  Pharmacy has been consulted for ceftriaxone dosing.   Plan: Will continue ceftriaxone 2 g iv q 24 hours.   Height: 5\' 4"  (162.6 cm) Weight: 142 lb 6.7 oz (64.6 kg) IBW/kg (Calculated) : 59.2  Temp (24hrs), Avg:97.9 F (36.6 C), Min:97.5 F (36.4 C), Max:98.3 F (36.8 C)   Recent Labs Lab 02/28/15 1812 02/28/15 2100 03/02/15 1554 03/02/15 1556 03/02/15 2059 03/03/15 0316 03/04/15 0748 03/05/15 0638  WBC 6.5  --   --  3.8  --  3.5* 4.6 7.0  CREATININE 0.71 0.58*  --  0.55*  --  0.53* 0.51*  --   LATICACIDVEN  --   --  1.6  --  1.5  --   --   --     Estimated Creatinine Clearance: 68.9 mL/min (by C-G formula based on Cr of 0.51).    Allergies  Allergen Reactions  . Simvastatin Anxiety    Antimicrobials this admission: Anti-infectives    Start     Dose/Rate Route Frequency Ordered Stop   03/06/15 1800  cefTRIAXone (ROCEPHIN) 2 g in dextrose 5 % 50 mL IVPB  Status:  Discontinued     2 g 100 mL/hr over 30 Minutes Intravenous Every 24 hours 03/05/15 1311 03/05/15 1337   03/05/15 1800  cefTRIAXone (ROCEPHIN) 2 g in dextrose 5 % 50 mL IVPB     2 g 100 mL/hr over 30 Minutes Intravenous Every 24 hours 03/05/15 1337     03/05/15 1315  cefTRIAXone (ROCEPHIN) 2 g in dextrose 5 % 50 mL IVPB  Status:  Discontinued     2 g 100 mL/hr over 30 Minutes Intravenous  Once 03/05/15 1311 03/05/15 1337   03/03/15 1700  cefTRIAXone (ROCEPHIN) 2 g in dextrose 5 % 50 mL IVPB  Status:  Discontinued     2 g 100 mL/hr over 30 Minutes Intravenous Every 24 hours 03/03/15 0903 03/05/15 0927   03/03/15 0400  ceFEPIme (MAXIPIME) 2 g in dextrose 5 % 50 mL IVPB  Status:  Discontinued     2 g 100 mL/hr over 30 Minutes Intravenous Every 12 hours 03/02/15 1938 03/03/15 0853   03/03/15 0000  ceFEPIme (MAXIPIME) 2 g in dextrose 5 % 50 mL IVPB  Status:  Discontinued     2 g 100 mL/hr over 30 Minutes  Intravenous  Once 03/02/15 1747 03/02/15 1826   03/03/15 0000  vancomycin (VANCOCIN) IVPB 1000 mg/200 mL premix     1,000 mg 200 mL/hr over 60 Minutes Intravenous  Once 03/02/15 1747 03/03/15 0100   03/02/15 2200  vancomycin (VANCOCIN) IVPB 750 mg/150 ml premix  Status:  Discontinued     750 mg 150 mL/hr over 60 Minutes Intravenous Every 12 hours 03/02/15 1747 03/03/15 0853   03/02/15 1530  ceFEPIme (MAXIPIME) 2 g in dextrose 5 % 50 mL IVPB     2 g 100 mL/hr over 30 Minutes Intravenous  Once 03/02/15 1519 03/02/15 1650   03/02/15 1530  vancomycin (VANCOCIN) IVPB 1000 mg/200 mL premix     1,000 mg 200 mL/hr over 60 Minutes Intravenous  Once 03/02/15 1519 03/02/15 1704      Microbiology results: Results for orders placed or performed during the hospital encounter of 03/02/15  Urine culture     Status: None   Collection Time: 03/02/15  3:18 PM  Result Value Ref Range Status   Specimen Description URINE, RANDOM  Final  Special Requests NONE  Final   Culture MULTIPLE SPECIES PRESENT, SUGGEST RECOLLECTION  Final   Report Status 03/04/2015 FINAL  Final  Blood Culture (routine x 2)     Status: None   Collection Time: 03/02/15  3:45 PM  Result Value Ref Range Status   Specimen Description BLOOD RIGHT FOREARM  Final   Special Requests BOTTLES DRAWN AEROBIC AND ANAEROBIC  3CC  Final   Culture  Setup Time   Final    GRAM NEGATIVE RODS IN BOTH AEROBIC AND ANAEROBIC BOTTLES SERRATIA MARCESCENS CRITICAL RESULT CALLED TO, READ BACK BY AND VERIFIED WITH: Roe Coombs 03/03/2015 0844 BY J RAZZAKSUAREZ,MT    Culture   Final    SERRATIA MARCESCENS IN BOTH AEROBIC AND ANAEROBIC BOTTLES    Report Status 03/05/2015 FINAL  Final   Organism ID, Bacteria SERRATIA MARCESCENS  Final      Susceptibility   Serratia marcescens - MIC*    CEFAZOLIN >=64 RESISTANT Resistant     CEFEPIME <=1 SENSITIVE Sensitive     CEFTAZIDIME <=1 SENSITIVE Sensitive     CEFTRIAXONE <=1 SENSITIVE Sensitive      CIPROFLOXACIN <=0.25 SENSITIVE Sensitive     GENTAMICIN <=1 SENSITIVE Sensitive     TRIMETH/SULFA <=20 SENSITIVE Sensitive     * SERRATIA MARCESCENS  Blood Culture ID Panel (Reflexed)     Status: Abnormal   Collection Time: 03/02/15  3:45 PM  Result Value Ref Range Status   Enterococcus species NOT DETECTED NOT DETECTED Final   Listeria monocytogenes NOT DETECTED NOT DETECTED Final   Staphylococcus species NOT DETECTED NOT DETECTED Final   Staphylococcus aureus NOT DETECTED NOT DETECTED Final   Streptococcus species NOT DETECTED NOT DETECTED Final   Streptococcus agalactiae NOT DETECTED NOT DETECTED Final   Streptococcus pneumoniae NOT DETECTED NOT DETECTED Final   Streptococcus pyogenes NOT DETECTED NOT DETECTED Final   Acinetobacter baumannii NOT DETECTED NOT DETECTED Final   Enterobacteriaceae species DETECTED (A) NOT DETECTED Final    Comment: CRITICAL RESULT CALLED TO, READ BACK BY AND VERIFIED WITH: Roe Coombs 03/03/2015 0844 BY J RAZZAKSUAREZ,MT    Enterobacter cloacae complex NOT DETECTED NOT DETECTED Final   Escherichia coli NOT DETECTED NOT DETECTED Final   Klebsiella oxytoca NOT DETECTED NOT DETECTED Final   Klebsiella pneumoniae NOT DETECTED NOT DETECTED Final   Proteus species NOT DETECTED NOT DETECTED Final   Serratia marcescens DETECTED (A) NOT DETECTED Final    Comment: CRITICAL RESULT CALLED TO, READ BACK BY AND VERIFIED WITH: Roe Coombs 03/03/2015 0844 BY J RAZZAKSUAREZ,MT    Haemophilus influenzae NOT DETECTED NOT DETECTED Final   Neisseria meningitidis NOT DETECTED NOT DETECTED Final   Pseudomonas aeruginosa NOT DETECTED NOT DETECTED Final   Candida albicans NOT DETECTED NOT DETECTED Final   Candida glabrata NOT DETECTED NOT DETECTED Final   Candida krusei NOT DETECTED NOT DETECTED Final   Candida parapsilosis NOT DETECTED NOT DETECTED Final   Candida tropicalis NOT DETECTED NOT DETECTED Final   Carbapenem resistance NOT DETECTED NOT DETECTED Final    Methicillin resistance NOT DETECTED NOT DETECTED Final   Vancomycin resistance NOT DETECTED NOT DETECTED Final  Blood Culture (routine x 2)     Status: None   Collection Time: 03/02/15  3:53 PM  Result Value Ref Range Status   Specimen Description BLOOD RIGHT ANTECUBITAL  Final   Special Requests BOTTLES DRAWN AEROBIC AND ANAEROBIC  3CC  Final   Culture  Setup Time   Final    GRAM NEGATIVE RODS  IN BOTH AEROBIC AND ANAEROBIC BOTTLES CRITICAL VALUE NOTED.  VALUE IS CONSISTENT WITH PREVIOUSLY REPORTED AND CALLED VALUE.    Culture   Final    SERRATIA MARCESCENS IN BOTH AEROBIC AND ANAEROBIC BOTTLES    Report Status 03/05/2015 FINAL  Final   Organism ID, Bacteria SERRATIA MARCESCENS  Final      Susceptibility   Serratia marcescens - MIC*    CEFAZOLIN >=64 RESISTANT Resistant     CEFEPIME <=1 SENSITIVE Sensitive     CEFTAZIDIME <=1 SENSITIVE Sensitive     CEFTRIAXONE <=1 SENSITIVE Sensitive     CIPROFLOXACIN <=0.25 SENSITIVE Sensitive     GENTAMICIN <=1 SENSITIVE Sensitive     TRIMETH/SULFA <=20 SENSITIVE Sensitive     * SERRATIA MARCESCENS  MRSA PCR Screening     Status: None   Collection Time: 03/05/15  6:41 AM  Result Value Ref Range Status   MRSA by PCR NEGATIVE NEGATIVE Final    Comment:        The GeneXpert MRSA Assay (FDA approved for NASAL specimens only), is one component of a comprehensive MRSA colonization surveillance program. It is not intended to diagnose MRSA infection nor to guide or monitor treatment for MRSA infections.     Thank you for allowing pharmacy to be a part of this patient's care.  Ulice Dash, PharmD Clinical Pharmacist  03/06/2015

## 2015-03-07 ENCOUNTER — Telehealth: Payer: Self-pay | Admitting: Internal Medicine

## 2015-03-07 MED ORDER — BISACODYL 10 MG RE SUPP: 10.0000 mg | Freq: Every day | RECTAL | Status: DC | PRN

## 2015-03-07 MED ORDER — PROCHLORPERAZINE 25 MG RE SUPP
25.0000 mg | Freq: Three times a day (TID) | RECTAL | Status: DC | PRN
  Filled 2015-03-07: qty 1

## 2015-03-07 MED ORDER — GLYCOPYRROLATE 1 MG PO TABS: 1.0000 mg | ORAL_TABLET | ORAL | Status: DC | PRN

## 2015-03-07 MED ORDER — LORAZEPAM BOLUS VIA INFUSION: 0.5000 mg | INTRAVENOUS | Status: DC | PRN

## 2015-03-07 MED ORDER — MORPHINE SULFATE (CONCENTRATE) 10 MG/0.5ML PO SOLN: 5.0000 mg | ORAL | Status: AC | PRN

## 2015-03-07 MED ORDER — GLYCOPYRROLATE 1 MG PO TABS: 1.0000 mg | ORAL_TABLET | ORAL | Status: AC | PRN

## 2015-03-07 MED ORDER — BISACODYL 10 MG RE SUPP: 10.0000 mg | Freq: Every day | RECTAL | Status: AC | PRN

## 2015-03-07 MED ORDER — ACETAMINOPHEN 650 MG RE SUPP: 650.0000 mg | RECTAL | Status: AC | PRN

## 2015-03-07 MED ORDER — PROCHLORPERAZINE 25 MG RE SUPP: 25.0000 mg | Freq: Three times a day (TID) | RECTAL | Status: AC | PRN

## 2015-03-07 MED ORDER — LORAZEPAM 0.5 MG PO TABS: 0.5000 mg | ORAL_TABLET | ORAL | Status: AC | PRN

## 2015-03-07 MED ORDER — LORAZEPAM 2 MG/ML IJ SOLN
0.5000 mg | INTRAMUSCULAR | Status: DC | PRN
  Administered 2015-03-07: 0.5 mg via INTRAVENOUS
  Filled 2015-03-07: qty 1

## 2015-03-07 MED ORDER — HALOPERIDOL 0.5 MG PO TABS: 0.5000 mg | ORAL_TABLET | ORAL | Status: DC | PRN

## 2015-03-07 MED ORDER — MORPHINE SULFATE (CONCENTRATE) 10 MG/0.5ML PO SOLN: 5.0000 mg | Freq: Four times a day (QID) | ORAL | Status: AC

## 2015-03-07 MED ORDER — MORPHINE SULFATE (CONCENTRATE) 10 MG/0.5ML PO SOLN
5.0000 mg | ORAL | Status: DC | PRN
  Filled 2015-03-07: qty 1

## 2015-03-07 MED ORDER — MORPHINE SULFATE (CONCENTRATE) 10 MG/0.5ML PO SOLN
5.0000 mg | Freq: Four times a day (QID) | ORAL | Status: DC
  Administered 2015-03-07: 5 mg via ORAL

## 2015-03-07 NOTE — Telephone Encounter (Signed)
FYI: Pt has been admitted into Hospice

## 2015-03-07 NOTE — Telephone Encounter (Signed)
HFU/Pt is being discharged today, Diagnosis is Pneumonia. Pt is scheduled for 03/14/2015 @ 11:15am. Thank you!

## 2015-03-07 NOTE — Progress Notes (Signed)
[place] referral received from [place]. [name] is a 75 year old man admitted to [facility] on 2/3 for treatment of PNA/sepsis. He has a past medical history of COPD oxygen dependent,dysphasia with Peg tube placement 01/2015, throat cancer, HTN, GERD, Hypothyroidism, degenerative arthritis, cervical spine stenosis, Afib w/RVR, CVA, RA and HLD. This is his second admission in the past month. Per chart note review patient was responding well to treatment for his pneumonia when he had an acute event on the morning of 2/6, a code blue was called and  he was intubated. Family met with attending physician at the time Dr. Tressia Miners and he was extubated to comfort care. Family spoke this morning with attending Dr. Manuella Ghazi and have requested transfer to the hospice home for symptom management of dyspnea, pain and anxiety/EOL care.  Patient seen lying in bed, oxygen on via non rebreather, patient appeared anxious and dyspneic. Family at bedside, wife [name], son [name], daughter in Theatre manager and granddaughter. Writer met in the family room with Mrs. Montanaro to initiate education regarding hospice services, philosophy and team approach to care with good understanding voiced. Questions answered, consents signed. She confirmed that patient would not be receiving feedings via his G-tube and that focus was to be on comfort/symptom management.  Dr. Megan Salon in the patient's room after writer met with [name]. Discussed symptom management needed prior to transport, family in agreement. Patient to receive PRN IV lorazepam 0.5 mg and a dose of liquid morphine 5 mg. Staff RN Estill Batten made aware of new orders. Information faxed to referral intake, report called to the hospice home, EMS notified for transport. Signed portable DNR in place on patient's chart. Hospital care team aware of and in agreement with patient's discharge today via EMS to the hospice home. Family in the patient's room made aware that EMS was notified for  transport, they will contact patient's wife when EMS arrives. Thank you for the opportunity to be involved in the care of this patient and his family. Flo Shanks RN, BSN, [place] [facility] [place], [facility] [phone] c

## 2015-03-07 NOTE — Care Management Important Message (Signed)
Important Message  Patient Details  Name: SUAVE MCKITTRICK MRN: [id number] Date of Birth: July 07, 1940   Medicare Important Message Given:  Yes    Juliann Pulse A Gonsalo Cuthbertson 03/07/2015, 11:36 AM

## 2015-03-07 NOTE — Discharge Instructions (Signed)
Hospice °Hospice is a service that is designed to provide people who are terminally ill and their families with medical, spiritual, and psychological support. Its aim is to improve your quality of life by keeping you as alert and comfortable as possible. Hospice is performed by a team of health care professionals and volunteers who: °· Help keep you comfortable. Hospice can be provided in your home or in a homelike setting. The hospice staff works with your family and friends to help meet your needs. You will enjoy the support of loved ones by receiving much of your basic care from family and friends. °· Provide pain relief and manage your symptoms. The staff supply all necessary medicines and equipment. °· Provide companionship when you are alone. °· Allow you and your family to rest. They may do light housekeeping, prepare meals, and run errands. °· Provide counseling. They will make sure your emotional, spiritual, and social needs and those of your family are being met. °· Provide spiritual care. Spiritual care is individualized to meet your needs and your family's needs. It may involve helping you look at what death means to you, say goodbye, or perform a specific religious ceremony or ritual. °Hospice teams often include: °· A nurse. °· A doctor. °· Social workers. °· Religious leaders (such as a chaplain). °· Trained volunteers. °WHEN SHOULD HOSPICE CARE BEGIN? °Most people who use hospice are believed to have fewer than 6 months to live. Your family and health care providers can help you decide when hospice services should begin. If your condition improves, you may discontinue the program. °WHAT SHOULD I CONSIDER BEFORE SELECTING A PROGRAM? °Most hospice programs are run by nonprofit, independent organizations. Some are affiliated with hospitals, nursing homes, or home health care agencies. Hospice programs can take place in the home or at a hospice center, hospital, or skilled nursing facility. When choosing  a hospice program, ask the following questions: °· What services are available to me? °· What services are offered to my loved ones? °· How involved are my loved ones? °· How involved is my health care provider? °· Who makes up the hospice care team? How are they trained or screened? °· How will my pain and symptoms be managed? °· If my circumstances change, can the services be provided in a different setting, such as my home or in the hospital? °· Is the program reviewed and licensed by the state or certified in some other way? °WHERE CAN I LEARN MORE ABOUT HOSPICE? °You can learn about existing hospice programs in your area from your health care providers. You can also read more about hospice online. The websites of the following organizations contain helpful information: °· The National Hospice and Palliative Care Organization (NHPCO). °· The Hospice Association of America (HAA). °· The Hospice Education Institute. °· The American Cancer Society (ACS). °· Hospice Net. °  °This information is not intended to replace advice given to you by your health care provider. Make sure you discuss any questions you have with your health care provider. °  °Document Released: 05/02/2003 Document Revised: 01/18/2013 Document Reviewed: 11/23/2012 °Elsevier Interactive Patient Education ©2016 Elsevier Inc. ° °

## 2015-03-07 NOTE — Consult Note (Addendum)
Palliative Medicine Inpatient Consult Note   Name: Johnny Sanford Date: 03/07/2015 MRN: [id number]  DOB: 1940/03/30  Referring Physician: Max Sane, MD  Palliative Care consult requested for this 75 y.o. male for goals of medical therapy in patient with acute on chronic respiratory failure.   PLAN Hospice was consulted. Palliative Care was consulted. Pt is on 100% ventimask --but Hospice Home can handle this so I am informed pt will be going to [place]. I am asked to review and adjust discharge meds. I also confirmed portable DNR form is in the paper chart. I spoke with family briefly, and though there was confusion about whether pt was staying here or not initially, all this has been clarified and pt will go to [facility]. Pt is VERY anxious --due to some degree of air hunger and also due to fear (of dying while short of breath).   I talked to family a bit --including son, Johnny Sanford, by phone.     IMPRESSION: Acute on Chronic Resp Flre Sepsis due to pneumonia Pneumonia AFib RVR Adrenal Insufficiency Depression and Anxiety Hypothyroidism Thrombocythemia Dysphagia HTN History of throat cancer status post treatment Hyperglycemia Macrocytic Anemia of unclear etiology   REVIEW OF SYSTEMS:  Patient is not able to provide ROS due to illness  SPIRITUAL SUPPORT SYSTEM: Yes --many family members.  SOCIAL HISTORY:  reports that he quit smoking about 13 years ago. His smoking use included Cigarettes. He quit after 15 years of use. He has never used smokeless tobacco. He reports that he does not drink alcohol or use illicit drugs.  LEGAL DOCUMENTS:  DNR form is confirmed to be in the chart  CODE STATUS: DNR  PAST MEDICAL HISTORY: Past Medical History  Diagnosis Date  . HLD (hyperlipidemia)   . HTN (hypertension)   . GERD (gastroesophageal reflux disease)   . Hypothyroidism   . Cancer ([place]) 1991    sqauamous cell ca  . CVA (cerebral vascular accident) ([place])   . DA  (degenerative arthritis)   . RA (rheumatoid arthritis) ([place])   . Adrenal insufficiency ([place])   . Cervical spinal stenosis   . Falls   . Collagen vascular disease ([place])   . COPD (chronic obstructive pulmonary disease) ([place])     mild    PAST SURGICAL HISTORY:  Past Surgical History  Procedure Laterality Date  . Carotid endarterectomy      left  . Skin lesion excision      Dr. Sarajane Sanford, and Dr. Vickki San[facility] at [facility]  . Peg placement N/A 02/09/2015    Procedure: PERCUTANEOUS ENDOSCOPIC GASTROSTOMY (PEG) PLACEMENT;  Surgeon: Johnny Luster, MD;  Location: [facility] ENDOSCOPY;  Service: Endoscopy;  Laterality: N/A;    ALLERGIES:  is allergic to simvastatin.  MEDICATIONS:  Current Facility-Administered Medications  Medication Dose Route Frequency Provider Last Rate Last Dose  . acetaminophen (TYLENOL) suppository 650 mg  650 mg Rectal Q6H PRN Johnny Loll, MD      . albuterol (PROVENTIL) (2.5 MG/3ML) 0.083% nebulizer solution 2.5 mg  2.5 mg Nebulization Q2H PRN Johnny Loll, MD      . antiseptic oral rinse (BIOTENE) solution 15 mL  15 mL Topical PRN Johnny Spray, NP      . antiseptic oral rinse solution (CORINZ)  7 mL Mouth Rinse 10 times per day Johnny Shelling, MD   7 mL at 03/07/15 1000  . bisacodyl (DULCOLAX) suppository 10 mg  10 mg Rectal Daily PRN Johnny Spray, NP      .  chlorhexidine gluconate (PERIDEX) 0.12 % solution 15 mL  15 mL Mouth Rinse BID Johnny Shelling, MD   15 mL at 03/06/15 0858  . feeding supplement (JEVITY 1.5 CAL/FIBER) liquid 360 mL  360 mL Per Tube QID Johnny Spray, NP   50 mL at 03/06/15 2335  . fentaNYL (DURAGESIC - dosed mcg/hr) 75 mcg  75 mcg Transdermal Q72H Johnny Mcardle, MD   75 mcg at 03/06/15 1210  . free water 120 mL  120 mL Per Tube Q24H Johnny Spray, NP   120 mL at 03/06/15 0800  . glycopyrrolate (ROBINUL) tablet 1 mg  1 mg Oral Q4H PRN Johnny Spray, NP       Or  . glycopyrrolate (ROBINUL) injection 0.2 mg  0.2 mg Subcutaneous Q4H PRN Johnny Spray, NP       Or  . glycopyrrolate (ROBINUL) injection 0.2 mg  0.2 mg Intravenous Q4H PRN Johnny Spray, NP   0.2 mg at 03/06/15 0059  . haloperidol (HALDOL) tablet 0.5 mg  0.5 mg Oral Q4H PRN Johnny Spray, NP       Or  . haloperidol (HALDOL) 2 MG/ML solution 0.5 mg  0.5 mg Sublingual Q4H PRN Johnny Spray, NP       Or  . haloperidol lactate (HALDOL) injection 0.5 mg  0.5 mg Intravenous Q4H PRN Johnny Spray, NP      . morphine 100mg  in NS 110mL (1mg /mL) infusion - premix  1-10 mg/hr Intravenous Continuous Johnny Mcardle, MD   Stopped at 03/06/15 1621  . morphine 2 MG/ML injection 2-4 mg  2-4 mg Intravenous Q3H PRN Johnny Mcardle, MD   2 mg at 03/07/15 0705  . morphine bolus via infusion 1-4 mg  1-4 mg Intravenous Q1H PRN Johnny Spray, NP   2 mg at 03/05/15 1239  . ondansetron (ZOFRAN) tablet 4 mg  4 mg Oral Q6H PRN Johnny Loll, MD       Or  . ondansetron [facility]) injection 4 mg  4 mg Intravenous Q6H PRN Johnny Loll, MD      . polyvinyl alcohol (LIQUIFILM TEARS) 1.4 % ophthalmic solution 1 drop  1 drop Both Eyes QID PRN Johnny Spray, NP      . scopolamine (TRANSDERM-SCOP) 1 MG/3DAYS 1.5 mg  1 patch Transdermal Q72H Johnny Lighter, MD        Vital Signs: BP 168/91 mmHg  Pulse 102  Temp(Src) 97.6 F (36.4 C) (Axillary)  Resp 18  Ht 5\' 4"  (1.626 m)  Wt 64.6 kg (142 lb 6.7 oz)  BMI 24.43 kg/m2  SpO2 99% Filed Weights   03/03/15 0500 03/04/15 0402 03/05/15 0730  Weight: 65.091 kg (143 lb 8 oz) 64.592 kg (142 lb 6.4 oz) 64.6 kg (142 lb 6.7 oz)    Estimated body mass index is 24.43 kg/(m^2) as calculated from the following:   Height as of this encounter: 5\' 4"  (1.626 m).   Weight as of this encounter: 64.6 kg (142 lb 6.7 oz).  PERFORMANCE STATUS (ECOG) : 4 - Bedbound  PHYSICAL EXAM: Awake and alert Conversive --but appears uncomfortable  He keeps taking off the mask Cachectic No JVD or TM Hrt rrr tachy --distant HS Lungs with decreased lung  sounds throughout Abd soft and NT, flat Skin no mottling or cyanosis as yet.   LABS: CBC:    Component Value Date/Time   WBC 7.0 03/05/2015 0638   WBC 4.9 05/19/2014 1018   HGB  7.7* 03/05/2015 0638   HGB 10.1* 05/19/2014 1018   HCT 22.4* 03/05/2015 0638   HCT 29.5* 05/19/2014 1018   PLT 1137* 03/05/2015 0638   PLT 644* 05/19/2014 1018   MCV 128.4* 03/05/2015 0638   MCV 123* 05/19/2014 1018   NEUTROABS 5.9 03/05/2015 0638   NEUTROABS 3.6 05/19/2014 1018   LYMPHSABS 0.2* 03/05/2015 0638   LYMPHSABS 0.6* 05/19/2014 1018   MONOABS 0.9 03/05/2015 0638   MONOABS 0.6 05/19/2014 1018   EOSABS 0.0 03/05/2015 0638   EOSABS 0.0 05/19/2014 1018   BASOSABS 0.0 03/05/2015 0638   BASOSABS 0.0 05/19/2014 1018   BASOSABS 1 08/02/2013 0939   Comprehensive Metabolic Panel:    Component Value Date/Time   NA 134* 03/04/2015 0748   NA 130* 05/22/2014   NA 136 08/24/2013 0910   K 4.3 03/04/2015 0748   K 3.5 08/24/2013 0910   CL 91* 03/04/2015 0748   CL 97* 08/24/2013 0910   CO2 34* 03/04/2015 0748   CO2 35* 08/24/2013 0910   BUN 22* 03/04/2015 0748   BUN 17 05/22/2014   BUN 14 08/24/2013 0910   CREATININE 0.51* 03/04/2015 0748   CREATININE 0.6 05/22/2014   CREATININE 0.85 08/24/2013 0910   CREATININE 0.70 03/31/2012 1521   GLUCOSE 148* 03/04/2015 0748   GLUCOSE 100* 03/17/2014 1429   GLUCOSE 88 08/24/2013 0910   CALCIUM 8.4* 03/04/2015 0748   CALCIUM 8.7 08/24/2013 0910   AST 19 03/02/2015 1556   AST 20 08/24/2013 0910   ALT 21 03/02/2015 1556   ALT 19 08/24/2013 0910   ALKPHOS 52 03/02/2015 1556   ALKPHOS 59 08/24/2013 0910   BILITOT 0.5 03/02/2015 1556   BILITOT 0.3 08/24/2013 0910   PROT 7.1 03/02/2015 1556   PROT 7.1 08/24/2013 0910   ALBUMIN 3.1* 03/02/2015 1556   ALBUMIN 3.5 08/24/2013 0910     More than 50% of the visit was spent in counseling/coordination of care: Yes  Time Spent: 55 minutes

## 2015-03-07 NOTE — Care Management (Signed)
Plan for patient to discharge to hospice home.  Santiago Glad from hospice facilitating discharge. Palliative care consult complete.

## 2015-03-07 NOTE — Telephone Encounter (Signed)
Noted  

## 2015-03-07 NOTE — Progress Notes (Signed)
Spoke with Cecille Rubin, [facility] rep at [phone], to notify of non-emergent EMS transport.  Auth notification reference given as [id number].   Service date range good from 03/07/15 - 5/917.   Gap exception requested to determine if services can be considered at an in-network level.

## 2015-03-07 NOTE — Clinical Social Work Note (Signed)
CSW informed by MD that patient is hospice home appropriate. MD notified Santiago Glad with Hospice and Santiago Glad has made arrangements for patient to transfer to hospice home today. Shela Leff MSW,LCSW [phone]

## 2015-03-07 NOTE — Discharge Summary (Signed)
[facility] [place] NAME: Johnny Sanford    MR#:  [id number]  DATE OF BIRTH:  09-01-1940  DATE OF ADMISSION:  03/02/2015 ADMITTING PHYSICIAN: Demetrios Loll, MD  DATE OF DISCHARGE: 03/07/2015   PRIMARY CARE PHYSICIAN: Rica Mast, MD    ADMISSION DIAGNOSIS:  Healthcare-associated pneumonia [J18.9] COPD exacerbation ([place]) [J44.1] NSTEMI (non-ST elevated myocardial infarction) ([place]) [I21.4] Demand ischemia of myocardium ([place]) [I24.8] Sepsis ([place]) [A41.9] Sepsis, due to unspecified organism ([place]) [A41.9]  DISCHARGE DIAGNOSIS:  Principal Problem:   Pneumonia Active Problems:   Essential thrombocytosis ([place])   Absolute anemia Acute on Chronic Resp Failure Sepsis due to pneumonia Pneumonia AFib RVR Adrenal Insufficiency Depression and Anxiety Hypothyroidism Thrombocythemia Dysphagia HTN History of throat cancer status post treatment Hyperglycemia Macrocytic Anemia of unclear etiology  SECONDARY DIAGNOSIS:   Past Medical History  Diagnosis Date  . HLD (hyperlipidemia)   . HTN (hypertension)   . GERD (gastroesophageal reflux disease)   . Hypothyroidism   . 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 CVA (cerebral vascular accident) ([place])   . DA (degenerative arthritis)   . RA (rheumatoid arthritis) ([place])   . Adrenal insufficiency ([place])   . Cervical spinal stenosis   . Falls   . Collagen vascular disease ([place])   . COPD (chronic obstructive pulmonary disease) ([facility])     mild    HOSPITAL COURSE:  75 year old male with past medical history significant for throat cancer status post radiation, hypertension, COPD, CVA without any neurological deficits, recent admission for hyponatremia and also dysphagia resulted in PEG tube about 3 weeks ago admitted to the hospital secondary to worsening breathing and also hypoxia.  CODE BLUE was called for respiratory failure on 03/05/15 requiring intubation. Large mucous plug was  noted during intubation. Wife has told the intensivist that he would not want to be resuscitated and requested comfort care. So patient was terminally extubated and placed on morphine and transferred out of ICU for comfort care only.  Family has chosen to take him to hospice home.  They are all in agreement and so is patient is a hospice home bed available where he is being discharged for comfort care only. DISCHARGE CONDITIONS:   critical  CONSULTS OBTAINED:     DRUG ALLERGIES:   Allergies  Allergen Reactions  . Simvastatin Anxiety    DISCHARGE MEDICATIONS:   Current Discharge Medication List    START taking these medications   Details  acetaminophen (TYLENOL) 650 MG suppository Place 1 suppository (650 mg total) rectally every 4 (four) hours as needed for mild pain or fever. Qty: 12 suppository, Refills: 0    bisacodyl (DULCOLAX) 10 MG suppository Place 1 suppository (10 mg total) rectally daily as needed for moderate constipation. Qty: 6 suppository, Refills: 0    glycopyrrolate (ROBINUL) 1 MG tablet Take 1 tablet (1 mg total) by mouth every 4 (four) hours as needed (excessive secretions). Qty: 15 tablet, Refills: 0    LORazepam (ATIVAN) 0.5 MG tablet Take 1 tablet (0.5 mg total) by mouth every 2 (two) hours as needed for anxiety. Qty: 15 tablet, Refills: 0    !! Morphine Sulfate (MORPHINE CONCENTRATE) 10 MG/0.5ML SOLN concentrated solution Take 0.25 mLs (5 mg total) by mouth every 2 (two) hours as needed for moderate pain, severe pain or shortness of breath. Qty: 30 mL, Refills: 0    !! Morphine Sulfate (MORPHINE CONCENTRATE) 10 MG/0.5ML SOLN concentrated solution Take 0.25 mLs (5 mg  total) by mouth every 6 (six) hours. Qty: 30 mL, Refills: 0    prochlorperazine (COMPAZINE) 25 MG suppository Place 1 suppository (25 mg total) rectally every 8 (eight) hours as needed for nausea or vomiting. Qty: 6 suppository, Refills: 0     !! - Potential duplicate medications found.  Please discuss with provider.    STOP taking these medications     aspirin 81 MG EC tablet      clonazePAM (KLONOPIN) 0.5 MG tablet      escitalopram (LEXAPRO) 20 MG tablet      fludrocortisone (FLORINEF) 0.1 MG tablet      fluorouracil (EFUDEX) 5 % cream      fluticasone (FLONASE) 50 MCG/ACT nasal spray      Fluticasone Furoate-Vilanterol (BREO ELLIPTA) 200-25 MCG/INH AEPB      HYDROcodone-acetaminophen (NORCO/VICODIN) 5-325 MG tablet      hydroxyurea (HYDREA) 500 MG capsule      ipratropium-albuterol (DUONEB) 0.5-2.5 (3) MG/3ML SOLN      levothyroxine (SYNTHROID, LEVOTHROID) 137 MCG tablet      Nutritional Supplements (FEEDING SUPPLEMENT, JEVITY 1.5 CAL/FIBER,) LIQD      ranitidine (ZANTAC) 150 MG tablet          DISCHARGE INSTRUCTIONS:   DIET:  Clear liquid diet, advance as tolerated  DISCHARGE CONDITION:  Critical  ACTIVITY:  Activity as tolerated  OXYGEN:  Home Oxygen: Yes.     Oxygen Delivery: 2-4 liters/min via Patient connected to nasal cannula oxygen  DISCHARGE LOCATION:  Hospice Home   If you experience worsening of your admission symptoms, develop shortness of breath, life threatening emergency, suicidal or homicidal thoughts you must seek medical attention immediately by calling 911 or calling your MD immediately  if symptoms less severe.  You Must read complete instructions/literature along with all the possible adverse reactions/side effects for all the Medicines you take and that have been prescribed to you. Take any new Medicines after you have completely understood and accpet all the possible adverse reactions/side effects.   Please note  You were cared for by a hospitalist during your hospital stay. If you have any questions about your discharge medications or the care you received while you were in the hospital after you are discharged, you can call the unit and asked to speak with the hospitalist on call if the hospitalist that took care  of you is not available. Once you are discharged, your primary care physician will handle any further medical issues. Please note that NO REFILLS for any discharge medications will be authorized once you are discharged, as it is imperative that you return to your primary care physician (or establish a relationship with a primary care physician if you do not have one) for your aftercare needs so that they can reassess your need for medications and monitor your lab values.    On the day of Discharge:  VITAL SIGNS:  Blood pressure 155/97, pulse 90, temperature 98 F (36.7 C), temperature source Axillary, resp. rate 20, height 5\' 4"  (1.626 m), weight 64.6 kg (142 lb 6.7 oz), SpO2 95 %.  PHYSICAL EXAMINATION:  GENERAL:  75 y.o.-year-old patient lying in the bed with acute distress has venti mask on. But able to converse. Very cachectic looking EYES: Pupils equal, round, reactive to light and accommodation. No scleral icterus. Extraocular muscles intact.  HEENT: Head atraumatic, normocephalic. Oropharynx and nasopharynx clear.  NECK:  Supple, no jugular venous distention. No thyroid enlargement, no tenderness.  LUNGS: Decreased breath sounds bilaterally, no wheezing,  rales,rhonchi or crepitation. No use of accessory muscles of respiration.  CARDIOVASCULAR: S1, S2 normal. No murmurs, rubs, or gallops. tachy ABDOMEN: Soft, non-tender, non-distended. Bowel sounds present. No organomegaly or mass.  EXTREMITIES: No pedal edema, cyanosis, or clubbing.  NEUROLOGIC: Cranial nerves II through XII are intact. Muscle strength 5/5 in all extremities. Sensation intact. Gait not checked.  PSYCHIATRIC: The patient is alert and oriented x 3.  SKIN: No obvious rash, lesion, or ulcer.  DATA REVIEW:   CBC  Recent Labs Lab 03/05/15 0638  WBC 7.0  HGB 7.7*  HCT 22.4*  PLT 1137*    Chemistries   Recent Labs Lab 03/02/15 1556  03/04/15 0748  NA 131*  < > 134*  K 4.2  < > 4.3  CL 90*  < > 91*  CO2 34*   < > 34*  GLUCOSE 140*  < > 148*  BUN 26*  < > 22*  CREATININE 0.55*  < > 0.51*  CALCIUM 8.4*  < > 8.4*  AST 19  --   --   ALT 21  --   --   ALKPHOS 52  --   --   BILITOT 0.5  --   --   < > = values in this interval not displayed.  Cardiac Enzymes  Recent Labs Lab 03/03/15 0842  TROPONINI 0.21*    Microbiology Results  Results for orders placed or performed during the hospital encounter of 03/02/15  Urine culture     Status: None   Collection Time: 03/02/15  3:18 PM  Result Value Ref Range Status   Specimen Description URINE, RANDOM  Final   Special Requests NONE  Final   Culture MULTIPLE SPECIES PRESENT, SUGGEST RECOLLECTION  Final   Report Status 03/04/2015 FINAL  Final  Blood Culture (routine x 2)     Status: None   Collection Time: 03/02/15  3:45 PM  Result Value Ref Range Status   Specimen Description BLOOD RIGHT FOREARM  Final   Special Requests BOTTLES DRAWN AEROBIC AND ANAEROBIC  3CC  Final   Culture  Setup Time   Final    GRAM NEGATIVE RODS IN BOTH AEROBIC AND ANAEROBIC BOTTLES SERRATIA MARCESCENS CRITICAL RESULT CALLED TO, READ BACK BY AND VERIFIED WITH: Roe Coombs 03/03/2015 0844 BY J RAZZAKSUAREZ,MT    Culture   Final    SERRATIA MARCESCENS IN BOTH AEROBIC AND ANAEROBIC BOTTLES    Report Status 03/05/2015 FINAL  Final   Organism ID, Bacteria SERRATIA MARCESCENS  Final      Susceptibility   Serratia marcescens - MIC*    CEFAZOLIN >=64 RESISTANT Resistant     CEFEPIME <=1 SENSITIVE Sensitive     CEFTAZIDIME <=1 SENSITIVE Sensitive     CEFTRIAXONE <=1 SENSITIVE Sensitive     CIPROFLOXACIN <=0.25 SENSITIVE Sensitive     GENTAMICIN <=1 SENSITIVE Sensitive     TRIMETH/SULFA <=20 SENSITIVE Sensitive     * SERRATIA MARCESCENS  Blood Culture ID Panel (Reflexed)     Status: Abnormal   Collection Time: 03/02/15  3:45 PM  Result Value Ref Range Status   Enterococcus species NOT DETECTED NOT DETECTED Final   Listeria monocytogenes NOT DETECTED NOT DETECTED  Final   Staphylococcus species NOT DETECTED NOT DETECTED Final   Staphylococcus aureus NOT DETECTED NOT DETECTED Final   Streptococcus species NOT DETECTED NOT DETECTED Final   Streptococcus agalactiae NOT DETECTED NOT DETECTED Final   Streptococcus pneumoniae NOT DETECTED NOT DETECTED Final   Streptococcus pyogenes NOT DETECTED NOT  DETECTED Final   Acinetobacter baumannii NOT DETECTED NOT DETECTED Final   Enterobacteriaceae species DETECTED (A) NOT DETECTED Final    Comment: CRITICAL RESULT CALLED TO, READ BACK BY AND VERIFIED WITH: Roe Coombs 03/03/2015 0844 BY J RAZZAKSUAREZ,MT    Enterobacter cloacae complex NOT DETECTED NOT DETECTED Final   Escherichia coli NOT DETECTED NOT DETECTED Final   Klebsiella oxytoca NOT DETECTED NOT DETECTED Final   Klebsiella pneumoniae NOT DETECTED NOT DETECTED Final   Proteus species NOT DETECTED NOT DETECTED Final   Serratia marcescens DETECTED (A) NOT DETECTED Final    Comment: CRITICAL RESULT CALLED TO, READ BACK BY AND VERIFIED WITH: Roe Coombs 03/03/2015 0844 BY J RAZZAKSUAREZ,MT    Haemophilus influenzae NOT DETECTED NOT DETECTED Final   Neisseria meningitidis NOT DETECTED NOT DETECTED Final   Pseudomonas aeruginosa NOT DETECTED NOT DETECTED Final   Candida albicans NOT DETECTED NOT DETECTED Final   Candida glabrata NOT DETECTED NOT DETECTED Final   Candida krusei NOT DETECTED NOT DETECTED Final   Candida parapsilosis NOT DETECTED NOT DETECTED Final   Candida tropicalis NOT DETECTED NOT DETECTED Final   Carbapenem resistance NOT DETECTED NOT DETECTED Final   Methicillin resistance NOT DETECTED NOT DETECTED Final   Vancomycin resistance NOT DETECTED NOT DETECTED Final  Blood Culture (routine x 2)     Status: None   Collection Time: 03/02/15  3:53 PM  Result Value Ref Range Status   Specimen Description BLOOD RIGHT ANTECUBITAL  Final   Special Requests BOTTLES DRAWN AEROBIC AND ANAEROBIC  3CC  Final   Culture  Setup Time   Final     GRAM NEGATIVE RODS IN BOTH AEROBIC AND ANAEROBIC BOTTLES CRITICAL VALUE NOTED.  VALUE IS CONSISTENT WITH PREVIOUSLY REPORTED AND CALLED VALUE.    Culture   Final    SERRATIA MARCESCENS IN BOTH AEROBIC AND ANAEROBIC BOTTLES    Report Status 03/05/2015 FINAL  Final   Organism ID, Bacteria SERRATIA MARCESCENS  Final      Susceptibility   Serratia marcescens - MIC*    CEFAZOLIN >=64 RESISTANT Resistant     CEFEPIME <=1 SENSITIVE Sensitive     CEFTAZIDIME <=1 SENSITIVE Sensitive     CEFTRIAXONE <=1 SENSITIVE Sensitive     CIPROFLOXACIN <=0.25 SENSITIVE Sensitive     GENTAMICIN <=1 SENSITIVE Sensitive     TRIMETH/SULFA <=20 SENSITIVE Sensitive     * SERRATIA MARCESCENS  MRSA PCR Screening     Status: None   Collection Time: 03/05/15  6:41 AM  Result Value Ref Range Status   MRSA by PCR NEGATIVE NEGATIVE Final    Comment:        The GeneXpert MRSA Assay (FDA approved for NASAL specimens only), is one component of a comprehensive MRSA colonization surveillance program. It is not intended to diagnose MRSA infection nor to guide or monitor treatment for MRSA infections.     RADIOLOGY:  No results found.   Management plans discussed with the patient, family and they are in agreement.  CODE STATUS: DNR  TOTAL TIME TAKING CARE OF THIS PATIENT: 55 minutes.    [facility], Jacqueleen Pulver M.D on 03/07/2015 at 11:48 AM  Between 7am to 6pm - Pager - [phone]  After 6pm go to [email or web address] - password [email or web address] [facility]  Office  [phone]  CC: Primary care physician; Rica Mast, MD   Note: This dictation was prepared with Dragon dictation along with smaller phrase technology. Any transcriptional errors that result from this process are unintentional.

## 2015-03-07 NOTE — Telephone Encounter (Signed)
Thank you.  Will continue to follow as appropriate.

## 2015-03-09 ENCOUNTER — Telehealth: Payer: Self-pay | Admitting: *Deleted

## 2015-03-09 NOTE — Telephone Encounter (Signed)
FYI Patients family member called to cancel any further appointments with this office. He was admitted into hospice with 24-48 hours of life expectancy.

## 2015-03-09 NOTE — Telephone Encounter (Signed)
FYI

## 2015-03-12 NOTE — Telephone Encounter (Signed)
[facility] call from Hospice RN, Dub Mikes.  Patient passed away Mar 25, 2015 at 637am.

## 2015-03-14 ENCOUNTER — Ambulatory Visit: Payer: Medicare Other | Admitting: Internal Medicine

## 2015-03-16 ENCOUNTER — Ambulatory Visit: Payer: Medicare Other | Admitting: Internal Medicine

## 2015-03-28 DEATH — deceased

## 2015-04-17 ENCOUNTER — Ambulatory Visit: Payer: Medicare Other | Admitting: Internal Medicine

## 2015-05-25 ENCOUNTER — Ambulatory Visit: Payer: Medicare Other | Admitting: Oncology

## 2015-05-25 ENCOUNTER — Other Ambulatory Visit: Payer: Medicare Other

## 2017-02-05 IMAGING — CT CT ANGIO CHEST
1 of 2 series · 18 of 30 positions shown · IV contrast (APPLIED)
Comparison: Chest CT dated 04/12/2014. Also chest CT dated
06/07/2013 and 05/24/2012.

CLINICAL DATA: Hypotension.  History of SIADH.  Hypoxia.

EXAM:
CT ANGIOGRAPHY CHEST WITH CONTRAST
TECHNIQUE: Multidetector CT imaging of the chest was performed using the
standard protocol during bolus administration of intravenous
contrast. Multiplanar CT image reconstructions and MIPs were
obtained to evaluate the vascular anatomy.
CONTRAST:  75mL OMNIPAQUE IOHEXOL 350 MG/ML SOLN

[Series 5: pe 1.0 thins · axial · 0.68mm/px · z∈[-298,-44]mm · 18 of 285 slices shown]
[im 16/285  lung]
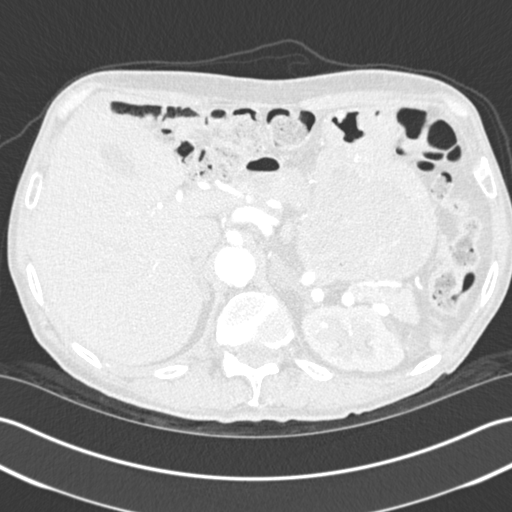
[im 32/285  mediastinal]
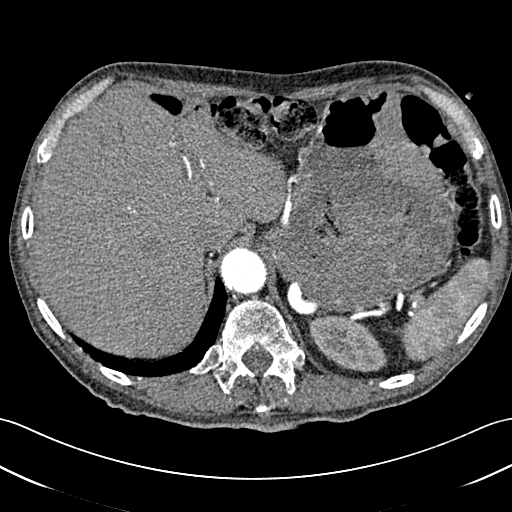
[im 48/285  lung]
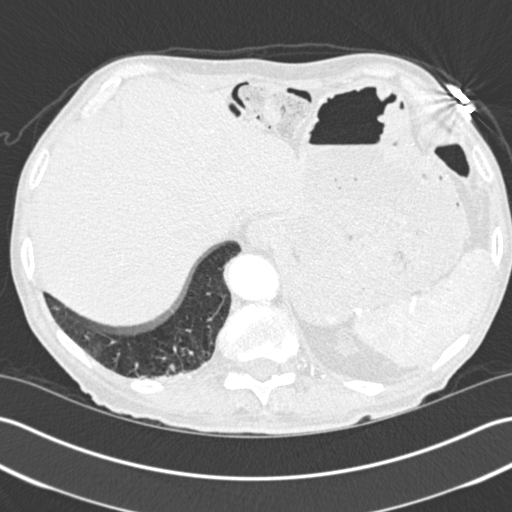
[im 64/285  mediastinal]
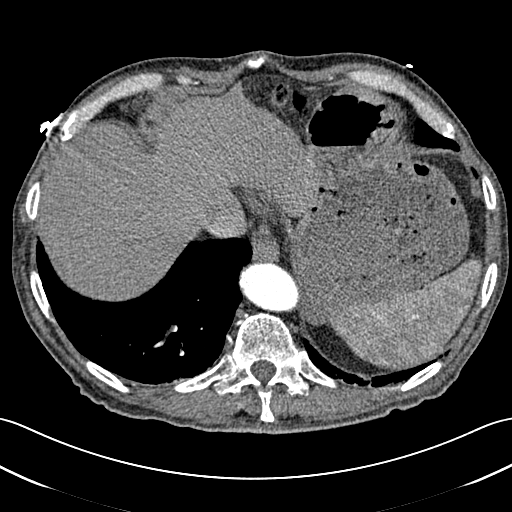
[im 79/285  lung]
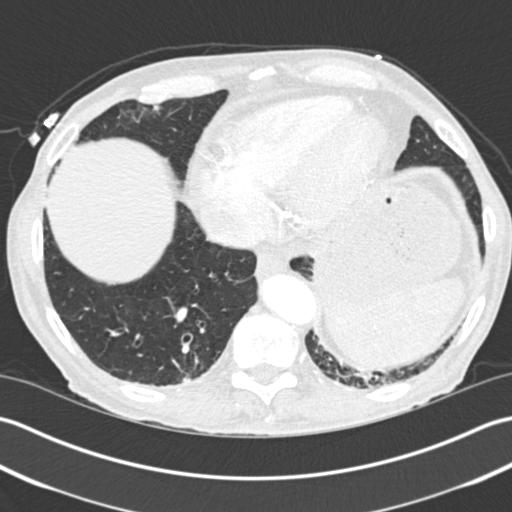
[im 95/285  mediastinal]
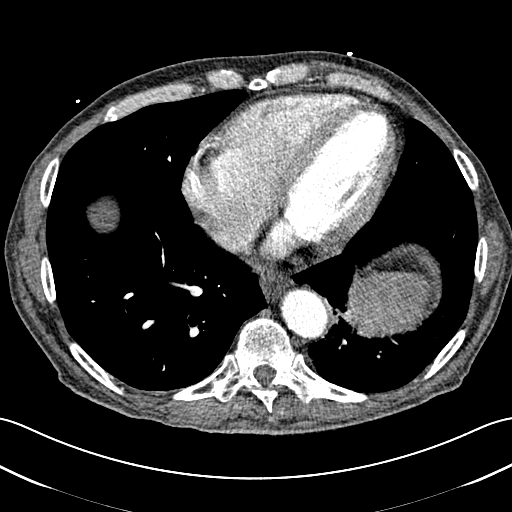
[im 111/285  lung]
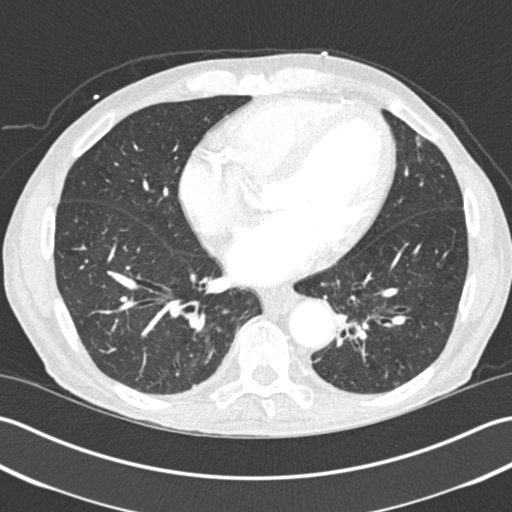
[im 127/285  mediastinal]
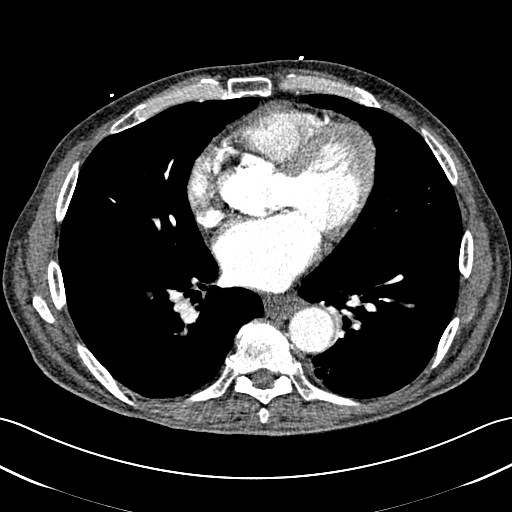
[im 133/285  lung]
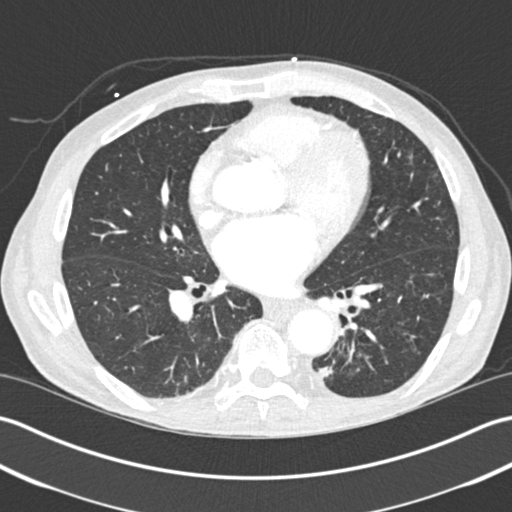
[im 143/285  mediastinal]
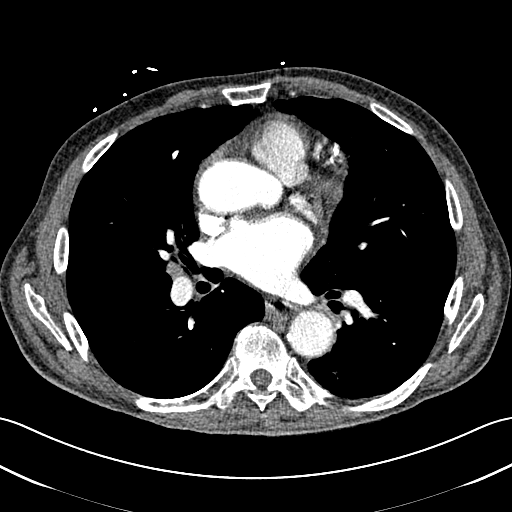
[im 158/285  lung]
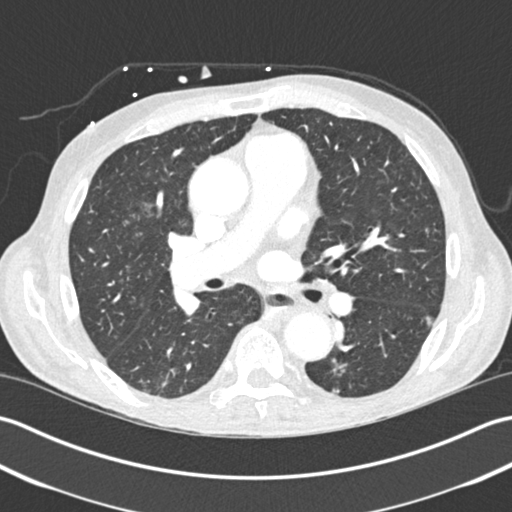
[im 174/285  mediastinal]
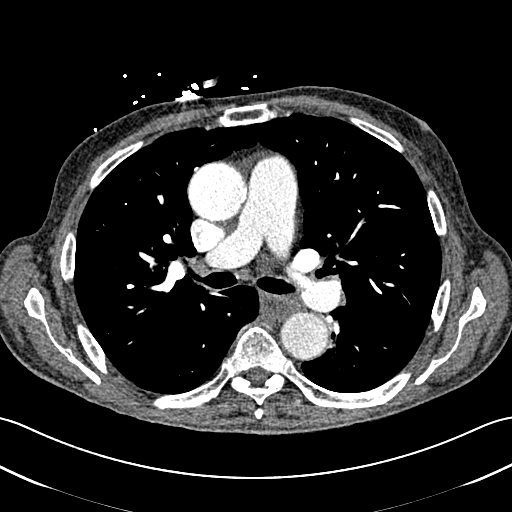
[im 190/285  lung]
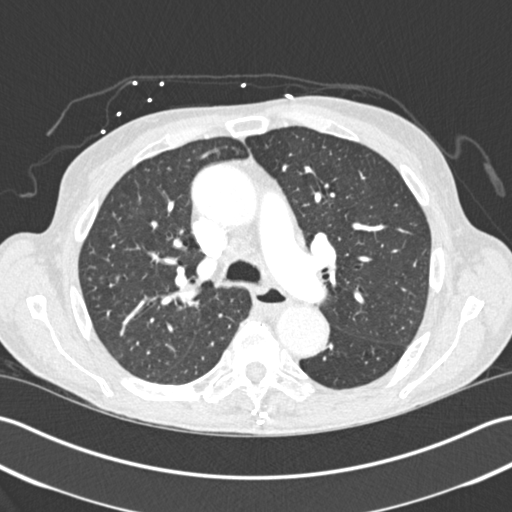
[im 206/285  mediastinal]
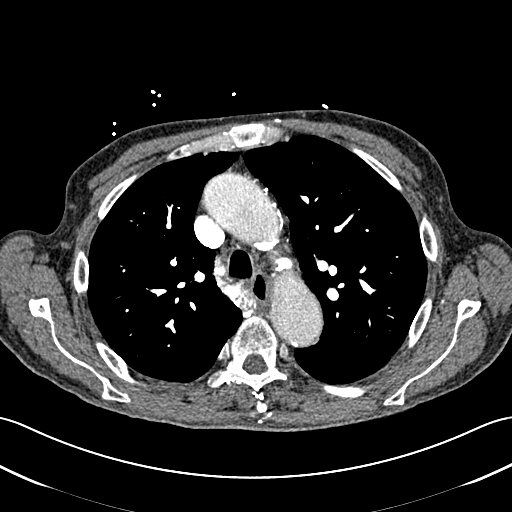
[im 221/285  lung]
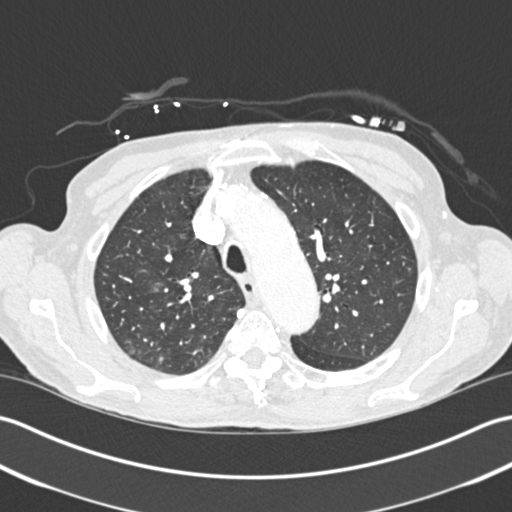
[im 237/285  mediastinal]
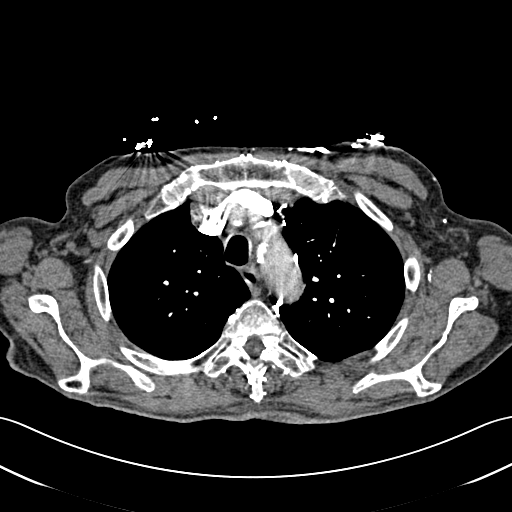
[im 253/285  lung]
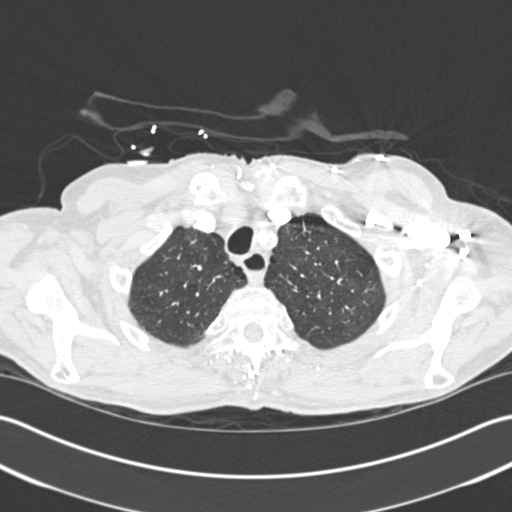
[im 269/285  mediastinal]
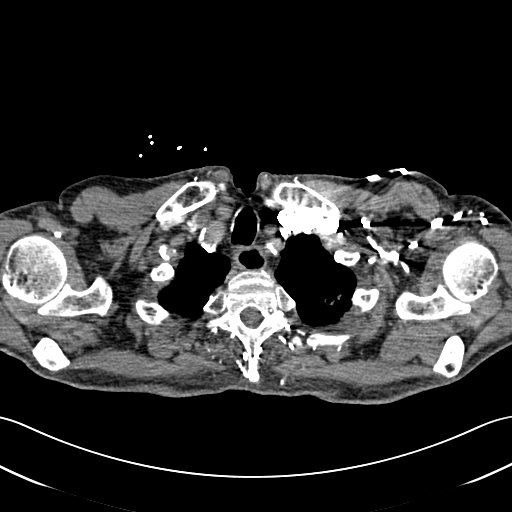

[18 of 30 positions shown; findings below may reference images not displayed]

FINDINGS: Mediastinum/Lymph Nodes: Thoracic aorta is normal in caliber and
configuration, with scattered atherosclerotic changes. No aortic
aneurysm or dissection. No pulmonary embolism identified within the
main, lobar or segmental pulmonary artery branches bilaterally.
Heart size is upper normal, stable. No pericardial effusion.
Coronary artery calcifications noted.

Several small lymph nodes noted within the mediastinum. No mass or
enlarged lymph nodes seen within the mediastinum or perihilar
regions.

Lungs/Pleura: Biapical scarring/fibrosis appears stable compared to
previous exams.

New small nodular consolidations, some with ground-glass density,
are appreciated within the right upper lobe, right lower lobe,
lingula and left lower lobe. Largest on the right is seen within the
inferior segment of the right upper lobe (series 6, image 55)
measuring 12 x 7 mm. Largest on the left is within the medial
segment of the left lower lobe measuring 13 x 9 mm (series 6, image
75). These are nonspecific and too small to definitively
characterize.

There is bibasilar bronchiectasis. No pleural effusion. No
pneumothorax. Small emphysematous blebs again noted within each lung
apex.

Upper abdomen: No acute findings. Stomach moderately distended,
incompletely imaged.

Musculoskeletal: Mild degenerative change within the thoracic spine
but no acute osseous abnormality.

Review of the MIP images confirms the above findings.
IMPRESSION: 1. New small nodular consolidations, some with ground-glass density,
scattered throughout the right upper lobe, right lower lobe, lingula
and left lower lobe. Measurements for the largest given above. These
are nonspecific in appearance and too small to definitively
characterize. Differential includes atypical pneumonias such as
viral or fungal, interstitial pneumonias, edema related to mild
volume overload/CHF, chronic interstitial diseases, hypersensitivity
pneumonitis, and respiratory bronchiolitis. Neoplastic process less
likely. Recommend follow-up chest CT in 3-6 months to ensure
resolution.
2. No aortic aneurysm or dissection.
3. No pulmonary embolism seen.
4. Heart size is upper normal. No pericardial effusion. Coronary
artery calcifications, particularly dense within the left main and
left anterior descending coronary arteries. Recommend correlation
with any possible associated cardiac symptoms.
